# Patient Record
Sex: Male | Born: 1935 | Race: White | Hispanic: No | Marital: Married | Smoking: Former smoker
Health system: Southern US, Community
[De-identification: ages and names within clinical notes are randomized; demographics above are authoritative.]

## PROBLEM LIST (undated history)

## (undated) DIAGNOSIS — I639 Cerebral infarction, unspecified: Secondary | ICD-10-CM

## (undated) DIAGNOSIS — I4891 Unspecified atrial fibrillation: Secondary | ICD-10-CM

## (undated) DIAGNOSIS — H9190 Unspecified hearing loss, unspecified ear: Secondary | ICD-10-CM

## (undated) DIAGNOSIS — K5730 Diverticulosis of large intestine without perforation or abscess without bleeding: Secondary | ICD-10-CM

## (undated) DIAGNOSIS — M2550 Pain in unspecified joint: Secondary | ICD-10-CM

## (undated) DIAGNOSIS — R413 Other amnesia: Secondary | ICD-10-CM

## (undated) DIAGNOSIS — F32A Depression, unspecified: Secondary | ICD-10-CM

## (undated) DIAGNOSIS — E785 Hyperlipidemia, unspecified: Secondary | ICD-10-CM

## (undated) DIAGNOSIS — G10 Huntington's disease: Secondary | ICD-10-CM

## (undated) DIAGNOSIS — R269 Unspecified abnormalities of gait and mobility: Secondary | ICD-10-CM

## (undated) DIAGNOSIS — K219 Gastro-esophageal reflux disease without esophagitis: Secondary | ICD-10-CM

## (undated) DIAGNOSIS — K449 Diaphragmatic hernia without obstruction or gangrene: Secondary | ICD-10-CM

## (undated) DIAGNOSIS — K222 Esophageal obstruction: Secondary | ICD-10-CM

## (undated) DIAGNOSIS — K5130 Ulcerative (chronic) rectosigmoiditis without complications: Secondary | ICD-10-CM

## (undated) DIAGNOSIS — F329 Major depressive disorder, single episode, unspecified: Secondary | ICD-10-CM

## (undated) DIAGNOSIS — M1990 Unspecified osteoarthritis, unspecified site: Secondary | ICD-10-CM

## (undated) DIAGNOSIS — E039 Hypothyroidism, unspecified: Secondary | ICD-10-CM

## (undated) DIAGNOSIS — K5190 Ulcerative colitis, unspecified, without complications: Secondary | ICD-10-CM

## (undated) DIAGNOSIS — I499 Cardiac arrhythmia, unspecified: Secondary | ICD-10-CM

## (undated) DIAGNOSIS — K649 Unspecified hemorrhoids: Secondary | ICD-10-CM

## (undated) DIAGNOSIS — N400 Enlarged prostate without lower urinary tract symptoms: Secondary | ICD-10-CM

## (undated) HISTORY — DX: Unspecified atrial fibrillation: I48.91

## (undated) HISTORY — DX: Ulcerative (chronic) rectosigmoiditis without complications: K51.30

## (undated) HISTORY — DX: Depression, unspecified: F32.A

## (undated) HISTORY — DX: Gastro-esophageal reflux disease without esophagitis: K21.9

## (undated) HISTORY — PX: BRAIN SURGERY: SHX531

## (undated) HISTORY — DX: Unspecified hearing loss, unspecified ear: H91.90

## (undated) HISTORY — PX: SHOULDER ARTHROSCOPY W/ ROTATOR CUFF REPAIR: SHX2400

## (undated) HISTORY — DX: Other amnesia: R41.3

## (undated) HISTORY — DX: Major depressive disorder, single episode, unspecified: F32.9

## (undated) HISTORY — DX: Hyperlipidemia, unspecified: E78.5

## (undated) HISTORY — DX: Unspecified hemorrhoids: K64.9

## (undated) HISTORY — DX: Diverticulosis of large intestine without perforation or abscess without bleeding: K57.30

## (undated) HISTORY — DX: Ulcerative colitis, unspecified, without complications: K51.90

## (undated) HISTORY — DX: Unspecified osteoarthritis, unspecified site: M19.90

## (undated) HISTORY — DX: Pain in unspecified joint: M25.50

## (undated) HISTORY — PX: LUMBAR FUSION: SHX111

## (undated) HISTORY — DX: Esophageal obstruction: K22.2

## (undated) HISTORY — DX: Cerebral infarction, unspecified: I63.9

## (undated) HISTORY — DX: Benign prostatic hyperplasia without lower urinary tract symptoms: N40.0

## (undated) HISTORY — PX: TONSILLECTOMY: SUR1361

## (undated) HISTORY — DX: Unspecified abnormalities of gait and mobility: R26.9

## (undated) HISTORY — PX: APPENDECTOMY: SHX54

## (undated) HISTORY — PX: OTHER SURGICAL HISTORY: SHX169

## (undated) HISTORY — DX: Diaphragmatic hernia without obstruction or gangrene: K44.9

---

## 1999-09-18 ENCOUNTER — Other Ambulatory Visit: Admission: RE | Admit: 1999-09-18 | Discharge: 1999-09-18 | Payer: Self-pay | Admitting: Urology

## 2001-01-11 ENCOUNTER — Encounter: Admission: RE | Admit: 2001-01-11 | Discharge: 2001-02-18 | Payer: Self-pay | Admitting: Orthopedic Surgery

## 2001-07-11 ENCOUNTER — Ambulatory Visit (HOSPITAL_COMMUNITY): Admission: RE | Admit: 2001-07-11 | Discharge: 2001-07-11 | Payer: Self-pay | Admitting: Internal Medicine

## 2001-11-17 ENCOUNTER — Encounter: Admission: RE | Admit: 2001-11-17 | Discharge: 2001-12-30 | Payer: Self-pay | Admitting: Orthopedic Surgery

## 2002-01-11 ENCOUNTER — Encounter: Admission: RE | Admit: 2002-01-11 | Discharge: 2002-01-19 | Payer: Self-pay | Admitting: Orthopedic Surgery

## 2003-05-29 ENCOUNTER — Ambulatory Visit (HOSPITAL_COMMUNITY): Admission: RE | Admit: 2003-05-29 | Discharge: 2003-05-29 | Payer: Self-pay | Admitting: Internal Medicine

## 2005-08-04 ENCOUNTER — Ambulatory Visit: Payer: Self-pay | Admitting: Internal Medicine

## 2005-08-18 ENCOUNTER — Ambulatory Visit: Payer: Self-pay | Admitting: Internal Medicine

## 2005-09-02 ENCOUNTER — Encounter: Payer: Self-pay | Admitting: Internal Medicine

## 2005-09-02 ENCOUNTER — Ambulatory Visit (HOSPITAL_COMMUNITY): Admission: RE | Admit: 2005-09-02 | Discharge: 2005-09-02 | Payer: Self-pay | Admitting: Internal Medicine

## 2005-09-02 ENCOUNTER — Ambulatory Visit: Payer: Self-pay | Admitting: Internal Medicine

## 2006-02-04 ENCOUNTER — Ambulatory Visit: Payer: Self-pay | Admitting: Cardiology

## 2006-02-25 ENCOUNTER — Ambulatory Visit: Payer: Self-pay | Admitting: Cardiology

## 2006-06-28 ENCOUNTER — Ambulatory Visit: Payer: Self-pay | Admitting: Internal Medicine

## 2006-06-29 ENCOUNTER — Ambulatory Visit: Payer: Self-pay | Admitting: Cardiology

## 2006-07-02 ENCOUNTER — Ambulatory Visit: Payer: Self-pay | Admitting: Cardiology

## 2006-07-02 ENCOUNTER — Ambulatory Visit (HOSPITAL_COMMUNITY): Admission: RE | Admit: 2006-07-02 | Discharge: 2006-07-02 | Payer: Self-pay | Admitting: Cardiology

## 2006-08-03 ENCOUNTER — Ambulatory Visit: Payer: Self-pay | Admitting: Cardiology

## 2006-08-18 ENCOUNTER — Ambulatory Visit: Payer: Self-pay | Admitting: Internal Medicine

## 2007-08-22 ENCOUNTER — Ambulatory Visit: Payer: Self-pay | Admitting: Gastroenterology

## 2007-11-01 ENCOUNTER — Ambulatory Visit (HOSPITAL_COMMUNITY): Admission: RE | Admit: 2007-11-01 | Discharge: 2007-11-01 | Payer: Self-pay | Admitting: Internal Medicine

## 2007-11-01 ENCOUNTER — Encounter: Payer: Self-pay | Admitting: Internal Medicine

## 2007-11-01 ENCOUNTER — Ambulatory Visit: Payer: Self-pay | Admitting: Internal Medicine

## 2008-04-17 HISTORY — PX: ESOPHAGOGASTRODUODENOSCOPY: SHX1529

## 2008-05-03 ENCOUNTER — Ambulatory Visit: Payer: Self-pay | Admitting: Internal Medicine

## 2008-05-04 ENCOUNTER — Ambulatory Visit (HOSPITAL_COMMUNITY): Admission: RE | Admit: 2008-05-04 | Discharge: 2008-05-04 | Payer: Self-pay | Admitting: Internal Medicine

## 2008-05-04 ENCOUNTER — Ambulatory Visit: Payer: Self-pay | Admitting: Internal Medicine

## 2008-08-17 DIAGNOSIS — G10 Huntington's disease: Secondary | ICD-10-CM

## 2008-08-17 HISTORY — DX: Huntington's disease: G10

## 2009-01-09 ENCOUNTER — Telehealth (INDEPENDENT_AMBULATORY_CARE_PROVIDER_SITE_OTHER): Payer: Self-pay

## 2009-01-21 ENCOUNTER — Encounter: Payer: Self-pay | Admitting: Gastroenterology

## 2009-03-08 DIAGNOSIS — R1319 Other dysphagia: Secondary | ICD-10-CM | POA: Insufficient documentation

## 2009-03-08 DIAGNOSIS — K5100 Ulcerative (chronic) pancolitis without complications: Secondary | ICD-10-CM | POA: Insufficient documentation

## 2009-03-08 DIAGNOSIS — K5730 Diverticulosis of large intestine without perforation or abscess without bleeding: Secondary | ICD-10-CM | POA: Insufficient documentation

## 2009-03-08 DIAGNOSIS — R197 Diarrhea, unspecified: Secondary | ICD-10-CM | POA: Insufficient documentation

## 2009-03-14 ENCOUNTER — Ambulatory Visit: Payer: Self-pay | Admitting: Internal Medicine

## 2009-03-27 ENCOUNTER — Encounter: Payer: Self-pay | Admitting: Internal Medicine

## 2009-11-07 ENCOUNTER — Encounter (INDEPENDENT_AMBULATORY_CARE_PROVIDER_SITE_OTHER): Payer: Self-pay

## 2009-12-18 ENCOUNTER — Encounter: Payer: Self-pay | Admitting: Gastroenterology

## 2009-12-23 ENCOUNTER — Encounter: Payer: Self-pay | Admitting: Gastroenterology

## 2010-01-01 ENCOUNTER — Encounter: Payer: Self-pay | Admitting: Gastroenterology

## 2010-01-21 ENCOUNTER — Ambulatory Visit (HOSPITAL_COMMUNITY): Admission: RE | Admit: 2010-01-21 | Discharge: 2010-01-21 | Payer: Self-pay | Admitting: Family Medicine

## 2010-02-05 ENCOUNTER — Encounter: Payer: Self-pay | Admitting: Internal Medicine

## 2010-02-14 HISTORY — PX: COLONOSCOPY: SHX174

## 2010-02-19 ENCOUNTER — Encounter: Payer: Self-pay | Admitting: Internal Medicine

## 2010-02-24 ENCOUNTER — Ambulatory Visit: Payer: Self-pay | Admitting: Internal Medicine

## 2010-02-24 ENCOUNTER — Ambulatory Visit (HOSPITAL_COMMUNITY): Admission: RE | Admit: 2010-02-24 | Discharge: 2010-02-24 | Payer: Self-pay | Admitting: Internal Medicine

## 2010-02-27 ENCOUNTER — Encounter: Payer: Self-pay | Admitting: Internal Medicine

## 2010-04-02 ENCOUNTER — Encounter: Payer: Self-pay | Admitting: Internal Medicine

## 2010-05-21 ENCOUNTER — Encounter: Payer: Self-pay | Admitting: Urgent Care

## 2010-09-16 NOTE — Letter (Signed)
Summary: LABS  LABS   Imported By: Hoy Morn 04/02/2010 09:50:03  _____________________________________________________________________  External Attachment:    Type:   Image     Comment:   External Document

## 2010-09-16 NOTE — Medication Information (Signed)
Summary: change to generic omeprazole  change to generic omeprazole   Imported By: Burnadette Peter LPN 88/71/9597 47:18:55  _____________________________________________________________________  External Attachment:    Type:   Image     Comment:   External Document

## 2010-09-16 NOTE — Medication Information (Signed)
Summary: Visual merchandiser   Imported By: Orma Flaming 02/19/2010 09:10:47  _____________________________________________________________________  External Attachment:    Type:   Image     Comment:   External Document

## 2010-09-16 NOTE — Medication Information (Signed)
Summary: pentasa rx  pentasa rx   Imported By: Burnadette Peter LPN 62/94/7654 65:03:54  _____________________________________________________________________  External Attachment:    Type:   Image     Comment:   External Document

## 2010-09-16 NOTE — Letter (Signed)
Summary: Recall, Screening Colonoscopy Only  [facility]  [address]   [place], [place] [address]   Phone: [phone]  Fax: [phone]    November 07, 2009  GILLIAM HAWKES [address] [place], [place]  [address] 1935-03-28   Dear Mr. Zehren,   Our records indicate it is time to schedule your colonoscopy.    Please call our office at [phone] and ask for the nurse.   Thank you,  Burnadette Peter, LPN Waldon Merl, [facility] Ph: [phone]   Fax: [phone]

## 2010-09-16 NOTE — Letter (Signed)
Summary: Patient Notice, Colon Biopsy Results  [facility]  [address]   [place], [place] [address]   Phone: [phone]  Fax: [phone]       February 27, 2010   KRISTAIN HU [address] [place], [place]  [address] [birth year]    Dear Mr. Duman,  I am pleased to inform you that the biopsies taken during your recent colonoscopy did not show any evidence of cancer upon pathologic examination.  Additional information/recommendations:  You should have a repeat colonoscopy examination  in 2 years.  Please call us if you are having persistent problems or have questions about your condition that have not been fully answered at this time.  Sincerely,    R. Garfield Cornea MD, [facility] Ph: [phone]    Fax: [phone]   Appended Document: Patient Notice, Colon Biopsy Results reminder appt made for 84yrTCS, Op & Path faxed to PCP- cdg    Appended Document: Patient Notice, Colon Biopsy Results Letter mailed to pt.

## 2010-09-16 NOTE — Letter (Signed)
Summary: Internal Other Lynne Logan  Internal Other Lynne Logan   Imported By: Waldon Merl LPN 95/97/4718 55:01:58  _____________________________________________________________________  External Attachment:    Type:   Image     Comment:   External Document

## 2010-09-16 NOTE — Medication Information (Signed)
Summary: RX Folder  RX Folder   Imported By: Sherry Ruffing 12/23/2009 13:34:14  _____________________________________________________________________  External Attachment:    Type:   Image     Comment:   External Document  Appended Document: RX Folder duplicate

## 2010-09-16 NOTE — Medication Information (Signed)
Summary: RX Folder  RX Folder   Imported By: Sherry Ruffing 12/18/2009 13:53:17  _____________________________________________________________________  External Attachment:    Type:   Image     Comment:   External Document  Appended Document: RX Folder need paper version  Appended Document: RX Folder On your cart.  Appended Document: RX Folder Need paper version of form in EMR, not paper chart.  Appended Document: RX Folder Please see if pt has tried alternatives?  Appended Document: RX Folder done

## 2010-12-30 NOTE — Op Note (Signed)
NAMEADAM, Sergio Baker               ACCOUNT NO.:  [id number]   MEDICAL RECORD NO.:  [id number]          PATIENT TYPE:  AMB   LOCATION:  DAY                           FACILITY:  [facility]   PHYSICIAN:  R. Garfield Cornea, M.D. DATE OF BIRTH:  01-17-1935   DATE OF PROCEDURE:  11/01/2007  DATE OF DISCHARGE:                               OPERATIVE REPORT   PROCEDURE:  Ileocolonoscopy, segmental biopsies.   INDICATIONS FOR PROCEDURE:  A 76 year old gentleman with longstanding  ulcerative colitis in remission on Pentasa.  Last colonoscopy was two  years ago.  He had evidence of microscopic colitis endoscopically.  The  colon looked relatively good.  He has been maintained in clinical  remission on Pentasa for some time now and currently continues not to  have any symptoms.  Colonoscopy is now being done with surveillance  biopsies.  Approach has discussed with the patient at length.  Potential  risks, benefits, limitations have been reviewed, his questions answered,  he is agreeable.  Please see documentation in the medical record.   PROCEDURE NOTE:  O2 saturation, blood pressure, pulse, respirations were  monitored throughout entire procedure.  Conscious sedation Versed 5 mg  IV Demerol 75 mg IV in divided doses.  Instrument Pentax video chip  system.   FINDINGS:  Digital rectal exam revealed no abnormalities.   ENDOSCOPIC FINDINGS:  The prep was adequate.  Colon:  Colonic mucosa was surveyed from rectosigmoid junction through  the left, transverse, right colon to area of appendiceal orifice,  ileocecal valve and cecum.  These structures well seen and photographed  for record.  The terminal ileum was intubated to 10 cm.  From this  level, scope was withdrawn.  All previously mentioned mucosal surfaces  were again seen.  The patient had left-sided diverticula.  However  endoscopically the colonic mucosa appeared normal.  Terminal ileum  mucosa appeared normal aside from a single diverticulum  noted.  Segmental biopsies of the ascending, transverse, descending and sigmoid  segments were taken to rule out dysplasia.  The scope was pulled down  into the rectum where thorough examination of rectal mucosa including  retroflex view of the anal verge demonstrated mucosal friability and  granularity.  There are no erosions or ulcers.  Biopsies of the rectal  mucosa were also taken.  The patient tolerated the procedure well was  reacted in endoscopy.   IMPRESSION:  1. Granularity and friability of the rectal mucosa status post biopsy.  2. Left-sided diverticula.  Endoscopically normal-appearing colonic      mucosa.  Terminal ileum with a single diverticulum.  Status post      segmental biopsy.   RECOMMENDATIONS:  1. Follow-up on path.  2. CBC and BMET today.  3. Further recommendations to follow.      Bridgette Habermann, M.D.  Electronically Signed     RMR/MEDQ  D:  11/01/2007  T:  11/01/2007  Job:  [id number]   cc:   [place]

## 2010-12-30 NOTE — Assessment & Plan Note (Signed)
NAMEJANI, PLOEGER                CHART#:  [id number]   DATE:  08/22/2007                       DOB:  [birth year]   CHIEF COMPLAINT:  Follow up ulcerative colitis/gastroesophageal reflux  disease.   SUBJECTIVE:  Mr. Jacobsen is a 76 year old Caucasian male with a history  of left-sided UC and diverticulosis as well as chronic GERD. He is  maintained on Pentasa 1 gram q.i.d. He has done quite well. He has  noticed a small amount of intermittent hematochezia with wiping and has  noticed some irritation and erythema to his anus. He thinks he is having  problems with hemorrhoids. 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 He is taking Aciphex 20 mg daily on a p.r.n. basis  for his GERD mainly because he tells me it is difficult to afford and  his weight is down 9 pounds in the last year.   CURRENT MEDICATIONS:  See the list from August 22, 2007.   ALLERGIES:  No known drug allergies.   OBJECTIVE:  VITAL SIGNS: Weight 165 pounds, height 69 inches,  temperature 97.5, blood pressure 138/78 and pulse 80.\  GENERAL: He is an elderly Caucasian male who is alert, oriented and  cooperative and in no acute distress.  HEENT: Sclerae are  clear and anicteric. Conjunctivae pink. Oropharynx  pink and moist without any lesions.  NECK: Supple without lymphadenopathy or thyromegaly.  CHEST: HEART: Regular rate and rhythm. Normal S1, S2.  ABDOMEN: Positive bowel sounds in all four quadrants. No bruits  auscultated. Soft and nontender, nondistended without palpable mass or  hepatosplenomegaly. No rebound, tenderness or guarding.  RECTAL: He has a 2 cm circumference of significant erythema without  exudate to the perianal area. Internal examination was benign. No masses  or tenderness.   ASSESSMENT:  1. Mr. Carrero is a 76 year old Caucasian male with left-sided      ulcerative colitis. He appears to have some distal symptoms of      proctitis at this time. He is also due  for surveillance colonoscopy      this year, but is wanting to put this off due to financial      concerns. I have urged him to have this done.  2. Chronic gastroesophageal reflux disease, well-controlled on proton      pump inhibitor.   PLAN:  1. Rowasa 4 gram enemas at bedtime #28 without refill.  2. Aciphex 20 mg daily #90 with three refills.  3. Pentasa 500 mg two p.o. q.i.d. #720 with three refills.  4. CBC and CMP.  5. He is going to call us  to schedule surveillance colonoscopy ASAP.      Otherwise, will plan on seeing him back in one year or sooner if he      has any problems.       Vickey Huger, N.P.  Electronically Signed     Caro Hight, M.D.  Electronically Signed    KJ/MEDQ  D:  08/22/2007  T:  08/22/2007  Job:  [id number]   cc:   Bonne Dolores, M.D.

## 2010-12-30 NOTE — Assessment & Plan Note (Signed)
NAMEDOLPH, TAVANO                CHART#:  [id number]   DATE:  08/22/2007                       DOB:  08-03-1935   CHIEF COMPLAINT:  Follow-up ulcerative colitis/GERD.   SUBJECTIVE:  Mr. Bridge is a 76 year old Caucasian male with a history  of left sided UC and diverticulosis as well as chronic GERD.  He is  maintained on potassium 1 g q.i.d. He has done quite well.  He has  noticed a small amount of intermittent hematochezia with wiping and has  noticed some irritation and erythema to his anus.  He thinks he is  having problems with hemorrhoids.  He has also been treated with  antibiotics for MRSA to his buttocks.  He is taking Aciphex 20 mg daily  on a p.r.n. basis for his GERD mainly because he tells me it is  difficult to afford.  His weight is down 9 pounds in the last year.   CURRENT MEDICATIONS:  See the list from 08/22/07.   ALLERGIES:  No known drug allergies.   OBJECTIVE:  VITAL SIGNS:  Weight 165 pounds, height 69 inches, temp  97.5, blood pressure 130/78 and pulse 80.  GENERAL:  He is an elderly Caucasian male who is alert, pleasant,  cooperative, no acute distress.  HEENT:  Pupils equal, clear, noninjected.  Conjunctivae pink. Oropharynx  pink and moist without any lesions.  CHEST:  Heart regular rate and rhythm, normal S1/S2.  ABDOMEN:  Positive bowel sounds x 4.  No bruits palpated, soft,  nontender, nondistended without palpable masses, no hepatosplenomegaly.  RECTAL EXAM:  Regarding rectal, he has a 2 cm circumference of  significant erythema without exudate to the perianal area.  Internal  exam was benign.  No masses or tenderness.   ASSESSMENT:  Mr. Stephens is a 48 year old Caucasian male with left sided  ulcerative colitis.  He appears to have some distal parosteitis at this  time.  He is also due for surveillance colonoscopy this year but is  wanting to put this off due to financial concerns.  I have urged him to  have this done.   He has chronic GERD  well controlled on PBI.   PLAN:  1. Rowasa 4 mg enemas at bed time #28 with no refill.  2. Aciphex 20 mg daily, #90 with 3 refills.  3. Pentasa 500 mg 2 p.o. q.i.d. #720 with 3 refills.  4. CBC and CMP.  5. He is going to call us to schedule surveillance colonoscopy.      Otherwise will plan on seeing him back in one year or sooner if he      has any problems.   ADDENDUM:  Cherylann Banas will call with results. OPV with KJ/RMR if needed.       Vickey Huger, N.P.  Electronically Signed     Caro Hight, M.D.  Electronically Signed    KJ/MEDQ  D:  08/22/2007  T:  08/23/2007  Job:  [id number]   cc:   Bonne Dolores, M.D.  Bridgette Habermann, M.D.

## 2010-12-30 NOTE — Op Note (Signed)
NAMERAWN, QUIROA               ACCOUNT NO.:  [id number]   MEDICAL RECORD NO.:  [id number]          PATIENT TYPE:  AMB   LOCATION:  DAY                           FACILITY:  [facility]   PHYSICIAN:  R. Garfield Cornea, M.D. DATE OF BIRTH:  [birth year]   DATE OF PROCEDURE:  05/04/2008  DATE OF DISCHARGE:                               OPERATIVE REPORT   PROCEDURE:  Esophagogastroduodenoscopy Maloney dilation.   INDICATIONS FOR PROCEDURE:  A 76 year old gentleman with history of  ulcerative colitis who came to see me recently with worsening esophageal  dysphagia and a background chronic gastroesophageal reflux disease.  He  had a Schatzki ring dilated some 27 years ago at [facility].  EGD  is now being done.  Potential for esophageal dilation were reviewed.  Risks, benefits, alternatives, and limitations have been discussed.  Questions answered and all parties agreeable.   PROCEDURE NOTE:  O2 saturation, blood pressure, pulse, and respirations  were monitored throughout the entire procedure.   CONSCIOUS SEDATION:  Versed 4 mg IV and Demerol 75 mg IV in divided  doses.  Cetacaine spray for topical pharyngeal anesthesia.   INSTRUMENT:  Pentax video chip system.   FINDINGS:  Examination of tubular esophagus revealed a Schatzki ring at  the GE junction; otherwise, esophageal mucosa appeared unremarkably  normal.  EG junction easily traversed. Stomach:  Gastric cavity was  emptied and insufflated well with air.  Thorough examination of the  gastric mucosa including retroflexed view of the proximal stomach  esophagogastric junction demonstrated only a small hiatal hernia.  Pylorus was patent and easily traversed.  Examination of the bulb and  second portion revealed no abnormalities.  Therapeutic/diagnostic  maneuvers were performed.  Scope was withdrawn.  A 56-French Maloney  dilator was passed with full insertion.  A look back revealed a nice  disruption of the ring without apparent  complication with minimal  bleeding.  The patient tolerated the procedure well and was reacted to  Endoscopy.   IMPRESSION:  1. Schatzki ring; otherwise, unremarkable esophagus status post      dilation as described above.  Otherwise, unremarkable esophagus.  2. Hiatal hernia; otherwise, normal stomach, D1 and D2.   RECOMMENDATIONS:  Mr. Talent should take Aciphex every day and not on a  as needed basis, and the importance of suppressing gastroesophageal  reflux has been reviewed.  We will plan to see Mr. Whetzel back in 1  year unless something comes up in the interim.      Bridgette Habermann, M.D.  Electronically Signed     RMR/MEDQ  D:  05/04/2008  T:  05/05/2008  Job:  [id number]   cc:   Chipper Herb, M.D.  Fax: [phone]

## 2010-12-30 NOTE — H&P (Signed)
NAMEROHNAN, BARTLESON               ACCOUNT NO.:  [id number]   MEDICAL RECORD NO.:  [id number]          PATIENT TYPE:  AMB   LOCATION:  DAY                           FACILITY:  [facility]   PHYSICIAN:  R. Garfield Cornea, M.D. DATE OF BIRTH:  Nov 30, 1934   DATE OF ADMISSION:  DATE OF DISCHARGE:  LH                              HISTORY & PHYSICAL   CHIEF COMPLAINT:  Dysphagia, gastroesophageal reflux disease.   HISTORY OF PRESENT ILLNESS:  Sergio Baker is a pleasant 76 year old  gentleman with a long history of left-sided proctocolitis  gastroesophageal reflux disease who over the past couple of months had  noted worsening symptoms of difficulty swallowing.  He has trouble  getting meats, breads down and sometimes it feels like he gets filled up  after eating just a few mouthfuls.  He has not had any frank abdominal  pain.  No nausea or vomiting, some regurgitation, has some difficulties  with pills.  He takes Aciphex only as needed 3-4 times weekly.  I have  reviewed chart going back to 1982.  He apparently did have an EGD by Dr.  Noemi Chapel at [facility] in 1982 which revealed only a small hiatal hernia.   SOCIAL HISTORY:  He does not use tobacco or alcohol.   PAST MEDICAL HISTORY:  Significant for appendectomy, tonsillectomy, cyst  removed from urinary bladder.  No history diabetes, hypertension, heart  disease, or pulmonary disease.   CURRENT MEDICATIONS:  1. Pentasa 500 mg tablet 4 tablets daily.  2. Flomax.  3. Vitamin supplements.  4. Glucosamine.  5. Asa 81 mg daily.  6. Finasteride 5 mg daily.  7. Bee Pollen.  8. Aciphex 20 mg as needed.  9. Nystatin.  10.Tylenol Arthritis.   ALLERGIES:  No known drug allergies.   FAMILY HISTORY:  Negative for chronic GI or liver illness.   SOCIAL HISTORY:  He is retired Furniture conservator/restorer.  He works as a Pharmacist, hospital  during the holidays.  He has one child.   REVIEW OF SYSTEMS:  No chest pain, dyspnea on exertion.  No change in  bowel habits.  No  change in weight.   PHYSICAL EXAMINATION:  GENERAL:  A pleasant 76 year old gentleman with a  long white beard, weight 170, height 5 feet 9, temperature 98.2, BP  120/78, and pulse 88.  SKIN:  Warm and dry.  There is no jaundice noted.  No stigmata, chronic  liver disease.  CHEST:  Lungs are clear to auscultation.  CARDIAC:  Regular rate and rhythm without murmur, gallop, or rub.  ABDOMEN:  Nondistended.  Positive bowel sounds, soft, nontender.  No  appreciable mass or  hepatosplenomegaly.   IMPRESSION:  Sergio Baker is a 76 year old gentleman with  longstanding gastroesophageal reflux disease on intermittent proton pump  inhibitor and now has vague symptoms esophageal dysphagia and early  satiety.  He has a history of a Schatzki's ring going back on EGD some  27 years ago.  I told Sergio Baker the best approach will be for him to  go and have an EGD with plans to dilate the esophagus if  appropriate.  Risks, benefits, and alternative have been reviewed, questions are  answered.  He is agreeable with plan to perform diagnostic EGD and  possible esophageal dilation in the very near future.  Further  recommendations to follow.      Bridgette Habermann, M.D.  Electronically Signed     RMR/MEDQ  D:  05/03/2008  T:  05/04/2008  Job:  [id number]   cc:   Chipper Herb, M.D.  Fax: [phone]

## 2011-01-02 NOTE — Op Note (Signed)
NAME:  Sergio Baker, Sergio Baker                         ACCOUNT NO.:  [id number]   MEDICAL RECORD NO.:  [id number]                   PATIENT TYPE:  AMB   LOCATION:  DAY                                  FACILITY:  [facility]   PHYSICIAN:  R. Garfield Cornea, M.D.              DATE OF BIRTH:  1935/07/22   DATE OF PROCEDURE:  05/29/2003  DATE OF DISCHARGE:                                 OPERATIVE REPORT   PROCEDURE:  Surveillance colonoscopy with biopsy.   INDICATIONS FOR PROCEDURE:  The patient is a 76 year old gentleman with a  nearly 20-year history of predominantly left-sided UC.  He is currently  asymptomatic on a regimen including Pentasa.   Last colonoscopy was in 2000.  He was found to have limited left-sided  disease with no evidence of dysplasia.  Colonoscopy is now being done as a  surveillance maneuver.  This approach has been discussed with the patient at  length.  The potential risks, benefits, and alternatives have been reviewed.  Please see the documentation in the medical record.   PROCEDURE:  O2 saturation, blood pressure, pulses, and respirations were  monitored throughout the entire procedure.  Conscious sedation was with  Versed 4 mg IV, Demerol 75 mg IV in divided doses.  The instrument used was  the Olympus video chip adult colonoscope.   FINDINGS:  Digital rectal examination revealed no abnormalities.   ENDOSCOPIC FINDINGS:  The prep was good.   Rectum:  Examination of the rectal mucosa including retroflex view of the  anal verge revealed no mucosal abnormalities.   Colon:  The colonic mucosa was surveyed from the rectosigmoid junction  through the left, transverse, right colon to the area of the appendiceal  orifice, ileocecal valve, and cecum.  These structures were well-seen and  photographed for the record.  The patient had some patchy granularity of the  sigmoid colon; otherwise, the colonic mucosa really looked entirely normal.  The terminal ileum was intubated to  5 cm.  This segment of the GI tract also  appeared normal.  From the level of the cecum and ileocecal valve, the scope  was slowly and cautiously withdrawn.  All previously mentioned mucosal  surfaces were again seen.  Segmental biopsies of the right transverse and  descending colon were taken for histologic study.  Also, several biopsies of  the sigmoid and rectal mucosa were also taken.  The patient tolerated the  procedure very well and was reactive in endoscopy.   IMPRESSION:  1. Normal rectum.  2. Patchy granularity of the sigmoid; otherwise, normal-appearing colonic     mucosa.  Normal terminal ileum.  Segmental biopsies taken.    RECOMMENDATIONS:  1. Follow up on pathology.  2. Further recommendations to follow.      ___________________________________________  Bridgette Habermann, M.D.   RMR/MEDQ  D:  05/29/2003  T:  05/29/2003  Job:  [id number]   cc:   Chipper Herb, M.D.  [place]  [place] [address]  Fax: [phone]

## 2011-01-02 NOTE — Op Note (Signed)
NAMEORAZIO, Sergio Baker               ACCOUNT NO.:  [id number]   MEDICAL RECORD NO.:  [id number]          PATIENT TYPE:  AMB   LOCATION:  DAY                           FACILITY:  [facility]   PHYSICIAN:  R. Garfield Cornea, M.D. DATE OF BIRTH:  1935-08-10   DATE OF PROCEDURE:  09/02/2005  DATE OF DISCHARGE:                                 OPERATIVE REPORT   PROCEDURE:  Colonoscopy, segmental biopsy, stool collection.   INDICATIONS FOR PROCEDURE:  Patient is a 76 year old gentleman with  longstanding proctocolitis.  He has had a flare of symptoms recently.  He  was seen in the office on August 18, 2005.  He is due for surveillance  colonoscopy anyway.  He has taken additional cortisone enemas and tells me  symptoms have markedly improved over the past one week.  No recent  antibiotics.  No sick exposures.  No NSAIDs.  Colonoscopy is now being done.  This approached has been discussed with the patient at length.  Potential  risks, benefits and alternatives have been reviewed and questions answered.  He is agreeable.  Please see documentation of the medical record.   PROCEDURE NOTE:  O2 saturation, blood pressure, pulse, and respirations are  monitored throughout the entire procedure.  Conscious sedation with Versed 2  mg IV and Demerol 50 mg IV.   INSTRUMENT:  Olympus videochip system.   FINDINGS:  Digital rectal exam revealed no abnormalities.   ENDOSCOPIC FINDINGS:  Prep was adequate.   RECTUM:  Examination of the rectal mucosa, including retroflexed view of the  anal verge, revealed very minimal granularity of the mucosa, normal vascular  pattern, well maintained.  Certainly, no erosions or ulcers were seen.   COLON:  Colonic mucosa was surveyed from the rectosigmoid junction through  the left transverse right colon, the area of the appendiceal orifice,  ileocecal valve, and cecum.  These structures were well seen and  photographed for the record.  From this point, the scope was  slowly  withdrawn.  The previously mentioned mucosal surfaces were again seen.  The  patient had scattered left-sided diverticula.  There was very minimal patchy  granularity of the sigmoid colon, otherwise the colonic mucosa looked  endoscopically normal.  Segmental biopsies of the right colon, descending,  sigmoid, and rectal mucosa were submitted to the pathologist.   A stool sample was submitted to the lab as well.   The patient tolerated the procedure well and was __________.   ENDOSCOPIC IMPRESSION:  1.  Internal hemorrhoids.  2.  Minimal granularity of the rectum and sigmoid.  3.  Left-sided diverticula.  4.  The remainder of the rectal colonic mucosa appeared normal.  Segmental      biopsies taken.  Stool sample obtained.   RECOMMENDATIONS:  1.  Will follow up on stool studies (culture and C. diff).  We will check      biopsies to assess for histologic colitis and superimposed CMV      infection.  2.  Continue AcipHex.  Continue Pentasa 1 gm q.i.d.  No NSAIDs.  3.  Further recommendations to follow.  Bridgette Habermann, M.D.  Electronically Signed     RMR/MEDQ  D:  09/02/2005  T:  09/02/2005  Job:  [id number]   cc:   Bonne Dolores, M.D.  Fax: [phone]

## 2011-01-02 NOTE — Letter (Signed)
August 03, 2006    Chipper Herb, M.D.  [address] [place], [place] [address]   RE:  Sergio Baker, Sergio Baker  MRN:  [id number]  /  DOB:  [birth year]   Dear Sergio Baker:   Sergio Baker returns to the office following one month of event  recording.  He identified two symptomatic episodes.  In one case, a  single PVC was present.  I do not have the tracing for the other event.  More impressive were multiple events automatically detected by the  device.  He had multiple episodes of atrial fibrillation set off by  PACs.  Each episode appears to be brief, lasting only a number of  seconds or minutes.  There is no indication that Sergio Baker was  symptomatic during any of these spells.  The heart rate was typically  around 130, but was 200 on one occasion.   Medications are unchanged.   On exam, a pleasant gentleman in no acute distress.  The weight is 159,  11 pounds less than one month ago.  Blood pressure 135/70, heart rate 75  and regular, respirations 16.   IMPRESSION:  Sergio Baker does have paroxysmal atrial arrhythmias.  Based  upon the two channels provided by the event recorder, this appears to be  paroxysmal atrial fibrillation.  The patient's only risk factor is his  age and excessive size.  With all episodes that we have identified being  brief and asymptomatic, I would not be inclined to treat him with full  anticoagulation.  He will continue taking aspirin.   It is hard to believe that he is not symptomatic when his heart rate is  200.  The brevity of these episodes may be the principle factor that  prevents him from appreciating that they are occurring.  It is likely  that the length of episodes will increase as he ages.  For now, I do not  believe that  therapy is warranted.  Since his current symptoms are nonspecific, if we  treat with him as drug such as a beta-blocker, it will be difficult to  tell whether this is exacerbating his weakness and malaise.  He will  call me  should he develop any more significant symptoms.  For now, his  symptoms have actually become very infrequent.  I will see him again in  six months.    Sincerely,      Cristopher Estimable. Lattie Haw, MD, [facility]  Electronically Signed    RMR/MedQ  DD: 08/03/2006  DT: 08/03/2006  Job #: [id number]

## 2011-01-02 NOTE — Assessment & Plan Note (Signed)
[facility]                              CARDIOLOGY OFFICE NOTE   Baker, Sergio                      MRN:          [id number]  DATE:02/25/2006                            DOB:          [birth year]    REFERRING PHYSICIAN:  Dr. Morrie Sheldon from [facility].   [name] returns to the office for additional assessment.  His laboratory  studies including a CBC, chemistry profile, lipid profile and TSH level were  entirely normal.  We performed a graded exercise test, a separate report of  which will be forwarded to your office.  [name] did very well with no  electrocardiographic evidence for myocardial ischemia and no symptoms.  He  did transiently develop an SVT to a rate of 190 that was also  asymptomatic.   At this point I see no evidence for significant cardiac disease.  Should his  arrhythmia becomes problematic, he could be treated with beta blocker.  At  the present time he feels well and does not require any additional medical  therapy.  Please let me know at any time that I can offer further assistance  in the care of this nice gentleman.                             Cristopher Estimable. Lattie Haw, MD, [facility]    RMR/MedQ  DD:  02/25/2006  DT:  02/25/2006  Job #:  [id number]

## 2011-01-02 NOTE — H&P (Signed)
NAMERICKEY, SADOWSKI               ACCOUNT NO.:  [id number]   MEDICAL RECORD NO.:  [id number]         PATIENT TYPE:  AMB   LOCATION:                                FACILITY:  [facility]   PHYSICIAN:  R. Garfield Cornea, M.D. DATE OF BIRTH:  21-Oct-1934   DATE OF ADMISSION:  08/18/2005  DATE OF DISCHARGE:  LH                                HISTORY & PHYSICAL   CHIEF COMPLAINT/HISTORY:  See recent tenesmus and flare of diarrhea.  Mr.  Sergio Baker is a pleasant 76 year old Caucasian male, with now  approximately a 25-year history of left-sided UC.  He has been well-  maintained over the years on Pentasa.  Last colonoscopy was in 2004.  He was  found to have proctocolitis limited to the sigmoid colon.  Endoscopically,  however, pan segmental biopsies did demonstrate microscopic colitis but not  much in the way of active disease.  Again as stated above, he was in  remission until the past several weeks when he started having some tenesmus  and some blood-tinged diarrhea.  He was given __________enemas.  He was seen  here on August 04, 2005.  He was switched over to cortisone enemas.  He  has been taking Pentasa, a dose of 1.25 g t.i.d.  He is somewhat improved  but not nearly back to his baseline.  He is not taking any nonsteroidals, no  antibiotics recently, no fever or chills.  Reflux symptoms are well-  controlled on AcipHex.  He has seen a urologist for some urinary frequency  and urgency.  He is on Flomax.   PAST MEDICAL HISTORY:  1.  Significant for appendectomy.  2.  Tonsillectomy.  3.  Cyst removed from urinary bladder.  4.  No history of diabetes, hypertension, heart disease, pulmonary disease.   SOCIAL HISTORY:  He is a retired Furniture conservator/restorer.  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  He has one child.  No alcohol, no tobacco.   FAMILY HISTORY:  Negative for chronic GI or liver illness.  No history of  colorectal cancer or inflammatory bowel disease.   REVIEW OF  SYSTEMS:  No odynophagia, no dysphagia, no early satiety, no chest  pain, dyspnea, no change in weight.  No fever, chills.   PHYSICAL EXAMINATION:  GENERAL:  This is a pleasant, bearded, 76 year old  gentleman resting comfortably.  VITAL SIGNS:  Weight 170, height 5 feet 9 inches, temperature 98.4, blood  pressure 160/68, pulse 68.  SKIN:  Warm and dry.  There is no jaundice.  HEENT:  No scleral icterus.  Conjunctivae are pink.  Oral cavity:  No  lesions.  NECK:  JVD is not prominent.  CHEST:  Lungs are clear to auscultation.  CARDIAC:  Regular rate and rhythm without murmur, gallop, or rub.  ABDOMEN:  Nondistended.  Positive bowel sounds.  Soft, nontender.  No  __________mass or organomegaly.  EXTREMITIES:  Have no edema.  RECTAL:  Exam deferred until time of colonoscopy.   IMPRESSION:  [name] is a pleasant 76 year old gentleman with long-  standing left-sided UC.  Histologically he  has more of a pan colitis.  He  has done well in the past until recently.  He has had some notable  improvement with cortisone enemas, but is nowhere near back to his baseline.  He is now due for surveillance colonoscopy anyway.  I told him to go ahead  and do a colonoscopy in a couple of weeks once he has had a chance to take  seven more days' worth of cortisone enemas (this will finish out his  prescription).  Potential risks, benefits, and alternatives have been  reviewed and we will make further recommendations once endoscopic evaluation  has been performed.      Bridgette Habermann, M.D.  Electronically Signed     RMR/MEDQ  D:  08/18/2005  T:  08/18/2005  Job:  [id number]

## 2011-01-02 NOTE — Letter (Signed)
June 29, 2006    Chipper Herb, M.D.  [address] [place], [place] [address]   RE:  Sergio Baker, Sergio Baker  MRN:  [id number]  /  DOB:  1935-06-05   Dear Sergio Baker:   Sergio Baker is seen in the office today at his request. He has vague  symptoms that occur intermittently, most commonly when he is standing or  active. His description of these is as if he is in slow motion. With further  questioning, these appear to represent a wave of weakness or malaise.  Typically, he sits down and rests for up to a half hour with resolution. If  he is already seated, he simply remains seated. There is no true dizziness.  He has certainly not experienced loss of consciousness. There is no dyspnea.   He has had a flare of ulcerative bowel disease recently, managed by Dr.  Gala Romney with an increase in his dose of Pentasa. He has a history of PSVT, but  symptoms and arrhythmia have never definitely been correlated.   CURRENT MEDICATIONS:  1. Flomax 0.4 mg daily.  2. Finasteride 5 mg daily.  3. Aspirin 81 mg daily.  4. A number of nutraceuticals.  5. Pentasa 1000 mg q.i.d.   PHYSICAL EXAMINATION:  GENERAL:  A pleasant gentleman with a full white  beard in no acute distress.  VITAL SIGNS:  The weight is 170, 3 pounds more than 4 months ago. Blood  pressure 130/70, heart rate 85 and regular, respirations 16.  NECK:  No jugular venous distention; normal carotid upstrokes without  bruits.  LUNGS:  Clear.  CARDIAC:  Normal first and second heart sounds; modest systolic ejection  murmur.  ABDOMEN:  Soft and nontender; no organomegaly.  EXTREMITIES:  No edema.   EKG:  Sinus rhythm with frequent PVCs; left anterior fascicular block;  otherwise unremarkable.   IMPRESSION:  Sergio Baker once again has symptoms that are difficult to  define. Certainly, he could be experiencing paroxysmal arrhythmias. An event  recorder will be provided to him. We will also proceed with an  echocardiogram to evaluate his murmur  and to verify normal left ventricular  systolic function. I will see him again once these studies have been  completed.    Sincerely,      Cristopher Estimable. Lattie Haw, MD, [facility]  Electronically Signed    RMR/MedQ  DD: 06/29/2006  DT: 06/29/2006  Job #: [id number]

## 2011-01-02 NOTE — Procedures (Signed)
Sergio Baker, [name]               ACCOUNT NO.:  [id number]   MEDICAL RECORD NO.:  [id number]          PATIENT TYPE:  OUT   LOCATION:  RAD                           FACILITY:  [facility]   PHYSICIAN:  Cristopher Estimable. Lattie Haw, MD, FACCDATE OF BIRTH:  1935/06/13   DATE OF PROCEDURE:  07/02/2006  DATE OF DISCHARGE:                                  ECHOCARDIOGRAM   CLINICAL DATA:  A 76 year old gentleman with murmur and fatigue. Aorta 3.1  left septum 1.4, posterior wall 1.2, LV diastole 3.8, LV systole 2.6.   1. Technically adequate echocardiographic study.  2. Normal left and right atrial size; normal right ventricular size and      function; mild RVH.  3. Mild sclerosis of a trileaflet aortic valve.  4. Normal aortic root size; mild calcification of the wall and annulus.  5. Normal tricuspid valve; physiologic regurgitation.  6. Normal pulmonic valve and proximal pulmonary artery.  7. Slight mitral valve thickening with borderline prolapse, mild annular      calcification and no regurgitation.  8. Normal IVC.  9. Normal left ventricular size; mild concentric hypertrophy; normal      regional and global function.      Cristopher Estimable. Lattie Haw, MD, [facility]  Electronically Signed     RMR/MEDQ  D:  07/02/2006  T:  07/02/2006  Job:  [id number]

## 2011-01-02 NOTE — Procedures (Signed)
[facility]                                EXERCISE TREADMILL   DONNA, SILVERMAN                      MRN:          [id number]  DATE:02/25/2006                            DOB:          07/26/1935    REFERRING PHYSICIAN:  Dr. Laurance Flatten, [facility].   INDICATION:  A 76 year old gentleman with exertional dyspnea and  intolerance.   Treadmill exercise performed to a workload of 9 METS and a heart rate of  196, 129% of age-predicted maximum.  Exercise was stopped due to the  patient's very rapid heart rate despite the fact that he was only minimally  symptomatic.   Blood pressure increased from a resting value of 140/90 to 190/95 at peak  exercise, a normal response.   Towards peak exercise there was a rapid increase of heart rate from a sinus  tachycardia at a rate of approximately 150 to a rapid regular rhythm in  excess of 190.  The exact electrophysiologic mechanism of this SVT could not  be divined.  He reverted back to sinus tachycardia immediately with the  cessation of exercise.   BASELINE EKG:  Normal sinus rhythm with PVCs; within normal limits.   STRESS EKG:  By visual inspection of the tracings, no significant ST-segment  depression was apparent.  The computer-averaged report suggested 1-1.5 mm of  flat ST-segment depression in leads III and F at peak exercise.  In the  first tracing without significant artifact obtained 15 seconds following the  cessation of exercise, there clearly was no ST-segment depression.   IMPRESSION:  Negative graded exercise test revealing adequate exercise  tolerance and no significant exercise-induced ST-segment abnormalities.  Occasional premature ventricular contractions were present throughout.  Paroxysmal supraventricular tachycardia occurred at peak exercise but did  not result in any significant symptoms and reverted rapidly with the  cessation of exercise.                       Cristopher Estimable. Lattie Haw, MD, [facility]   RMR/MedQ  DD:  02/25/2006  DT:  02/25/2006  Job #:  [id number]

## 2011-03-12 ENCOUNTER — Other Ambulatory Visit: Payer: Self-pay | Admitting: Family Medicine

## 2011-03-12 DIAGNOSIS — M5432 Sciatica, left side: Secondary | ICD-10-CM

## 2011-03-18 ENCOUNTER — Ambulatory Visit
Admission: RE | Admit: 2011-03-18 | Discharge: 2011-03-18 | Disposition: A | Discharge status: Home / Self Care | Payer: Medicare Other | Source: Ambulatory Visit | Attending: Family Medicine | Admitting: Family Medicine

## 2011-03-18 DIAGNOSIS — M5432 Sciatica, left side: Secondary | ICD-10-CM

## 2011-05-07 ENCOUNTER — Other Ambulatory Visit: Payer: Self-pay | Admitting: Neurosurgery

## 2011-05-07 DIAGNOSIS — M47816 Spondylosis without myelopathy or radiculopathy, lumbar region: Secondary | ICD-10-CM

## 2011-05-11 LAB — CBC
MCHC: 35.3
MCV: 95.4
WBC: 5

## 2011-05-11 LAB — BASIC METABOLIC PANEL
CO2: 27
Calcium: 8.6
Chloride: 106
Glucose, Bld: 112 — ABNORMAL HIGH
Sodium: 138

## 2011-05-13 ENCOUNTER — Ambulatory Visit
Admission: RE | Admit: 2011-05-13 | Discharge: 2011-05-13 | Disposition: A | Discharge status: Home / Self Care | Payer: Medicare Other | Source: Ambulatory Visit | Attending: Neurosurgery | Admitting: Neurosurgery

## 2011-05-13 DIAGNOSIS — M47816 Spondylosis without myelopathy or radiculopathy, lumbar region: Secondary | ICD-10-CM

## 2011-05-13 MED ORDER — IOHEXOL 180 MG/ML  SOLN
1.0000 mL | Freq: Once | INTRAMUSCULAR | Status: AC | PRN
  Administered 2011-05-13: 1 mL via EPIDURAL

## 2011-05-13 MED ORDER — DIAZEPAM 2 MG PO TABS: 5.0000 mg | ORAL_TABLET | Freq: Once | ORAL | Status: DC

## 2011-05-13 MED ORDER — METHYLPREDNISOLONE ACETATE 40 MG/ML INJ SUSP (RADIOLOG
120.0000 mg | Freq: Once | INTRAMUSCULAR | Status: AC
  Administered 2011-05-13: 120 mg via EPIDURAL

## 2011-06-09 ENCOUNTER — Other Ambulatory Visit: Payer: Self-pay

## 2011-06-09 NOTE — Telephone Encounter (Signed)
Pt due for 1 yr FU OV Please arrange.

## 2011-06-09 NOTE — Telephone Encounter (Signed)
Please schedule ov 1 year follow up

## 2011-06-22 ENCOUNTER — Other Ambulatory Visit: Payer: Self-pay

## 2011-06-23 ENCOUNTER — Telehealth: Payer: Self-pay | Admitting: Internal Medicine

## 2011-06-23 NOTE — Telephone Encounter (Signed)
Pt is aware of OV for 11/7 at 10 with LSL

## 2011-06-23 NOTE — Telephone Encounter (Signed)
I called pt to set up OV with RMR only and also put reminder in epic to follow up in one year. Pt said he would call back the beginning of the year to set up OV and also asked if we took care of his prescription that he called about earlier.  [phone]

## 2011-06-23 NOTE — Telephone Encounter (Signed)
He is taking the pentasa 4 times a day. Manuela Schwartz can you please make him an appointment with Dr.Rourk and let him know when it is. Thanks

## 2011-06-23 NOTE — Telephone Encounter (Signed)
Please verify dose with patient.  Patient also need one year f/u visit.

## 2011-06-23 NOTE — Telephone Encounter (Signed)
I need to know if he is taking one capsule four times daily or two capsule four times daily.

## 2011-06-23 NOTE — Telephone Encounter (Signed)
Reminder in epic to have a follow up OV in one year

## 2011-06-23 NOTE — Telephone Encounter (Signed)
Pt needs ov to further refills please.

## 2011-06-24 ENCOUNTER — Ambulatory Visit (INDEPENDENT_AMBULATORY_CARE_PROVIDER_SITE_OTHER): Payer: Medicare Other | Admitting: Gastroenterology

## 2011-06-24 ENCOUNTER — Encounter: Payer: Self-pay | Admitting: Gastroenterology

## 2011-06-24 VITALS — BP 120/71 | HR 101 | Temp 97.5°F | Ht 66.0 in | Wt 176.4 lb

## 2011-06-24 DIAGNOSIS — K5120 Ulcerative (chronic) proctitis without complications: Secondary | ICD-10-CM

## 2011-06-24 MED ORDER — MESALAMINE ER 500 MG PO CPCR: 1000.0000 mg | ORAL_CAPSULE | Freq: Four times a day (QID) | ORAL | Status: DC

## 2011-06-24 NOTE — Progress Notes (Signed)
Cc to PCP 

## 2011-06-24 NOTE — Patient Instructions (Signed)
You will be due for follow-up colonoscopy in 02/2012. We will send you a reminder letter. Please call with any questions or concerns.

## 2011-06-24 NOTE — Progress Notes (Signed)
Primary Care Physician: Clois Dupes, MD, MD  Primary Gastroenterologist:  Garfield Cornea, MD   Chief Complaint  Patient presents with  . Medication Refill    HPI: Sergio Baker is a 76 y.o. male here for yearly followup. He has a history of ulcerative proctocolitis for nearly 30 years. His last colonoscopy was in July of 2011. No evidence of dysplasia. Next colonoscopy due July 2013. He is doing very well. BM 2 daily. Denies blood in the stool, or abdominal pain, tenesmus. His appetite is good. Denies heartburn, dysphagia. No weight loss. States he needs a refill on his Pentasa.  Current Outpatient Prescriptions  Medication Sig Dispense Refill  . fenofibrate 160 MG tablet Take 160 mg by mouth daily.       . haloperidol (HALDOL) 0.5 MG tablet Take 0.5 mg by mouth 2 (two) times daily.       . mesalamine (PENTASA) 500 MG CR capsule Take 500 mg by mouth 4 (four) times daily. 2 pills qid          Allergies as of 06/24/2011  . (No Known Allergies)    ROS:  General: Negative for anorexia, weight loss, fever, chills, fatigue, weakness. ENT: Negative for hoarseness, difficulty swallowing , nasal congestion. CV: Negative for chest pain, angina, palpitations, dyspnea on exertion, peripheral edema.  Respiratory: Negative for dyspnea at rest, dyspnea on exertion, cough, sputum, wheezing.  GI: See history of present illness. GU:  Negative for dysuria, hematuria, urinary incontinence, urinary frequency, nocturnal urination.  Endo: Negative for unusual weight change.    Physical Examination:   BP 120/71  Pulse 101  Temp(Src) 97.5 F (36.4 C) (Temporal)  Ht 5' 6"  (4.332 m)  Wt 176 lb 6.4 oz (80.015 kg)  BMI 28.47 kg/m2  General: Well-nourished, well-developed in no acute distress.  Eyes: No icterus. Mouth: Oropharyngeal mucosa moist and pink , no lesions erythema or exudate. Lungs: Clear to auscultation bilaterally.  Heart: Regular rate and rhythm, no murmurs rubs or gallops.    Abdomen: Bowel sounds are normal, nontender, nondistended, no hepatosplenomegaly or masses, no abdominal bruits or hernia , no rebound or guarding.   Extremities: No lower extremity edema. No clubbing or deformities. Neuro: Alert and oriented x 4   Skin: Warm and dry, no jaundice.   Psych: Alert and cooperative, normal mood and affect.   Imaging Studies: No results found.

## 2011-06-24 NOTE — Assessment & Plan Note (Signed)
Doing well. Continue Pentasa. Due for surveillance colonoscopy July 2013.

## 2011-06-24 NOTE — Telephone Encounter (Signed)
Pt is here today for an office visit.

## 2011-07-20 ENCOUNTER — Other Ambulatory Visit: Payer: Self-pay | Admitting: Neurosurgery

## 2011-07-20 DIAGNOSIS — M47816 Spondylosis without myelopathy or radiculopathy, lumbar region: Secondary | ICD-10-CM

## 2011-07-21 ENCOUNTER — Ambulatory Visit
Admission: RE | Admit: 2011-07-21 | Discharge: 2011-07-21 | Disposition: A | Discharge status: Home / Self Care | Payer: Medicare Other | Source: Ambulatory Visit | Attending: Neurosurgery | Admitting: Neurosurgery

## 2011-07-21 DIAGNOSIS — M47816 Spondylosis without myelopathy or radiculopathy, lumbar region: Secondary | ICD-10-CM

## 2011-07-21 MED ORDER — DIAZEPAM 5 MG PO TABS
5.0000 mg | ORAL_TABLET | Freq: Once | ORAL | Status: AC
  Administered 2011-07-21: 5 mg via ORAL

## 2011-07-21 MED ORDER — IOHEXOL 180 MG/ML  SOLN
1.0000 mL | Freq: Once | INTRAMUSCULAR | Status: AC | PRN
  Administered 2011-07-21: 1 mL via EPIDURAL

## 2011-07-21 MED ORDER — METHYLPREDNISOLONE ACETATE 40 MG/ML INJ SUSP (RADIOLOG
120.0000 mg | Freq: Once | INTRAMUSCULAR | Status: AC
  Administered 2011-07-21: 120 mg via EPIDURAL

## 2011-07-21 NOTE — Progress Notes (Signed)
Pt wanted valium pre-procedure due to his Huntington's disease. Tolerated injection well.

## 2012-01-06 ENCOUNTER — Other Ambulatory Visit: Payer: Self-pay | Admitting: Neurosurgery

## 2012-01-06 DIAGNOSIS — M47816 Spondylosis without myelopathy or radiculopathy, lumbar region: Secondary | ICD-10-CM

## 2012-01-08 ENCOUNTER — Ambulatory Visit
Admission: RE | Admit: 2012-01-08 | Discharge: 2012-01-08 | Disposition: A | Discharge status: Home / Self Care | Payer: Medicare Other | Source: Ambulatory Visit | Attending: Neurosurgery | Admitting: Neurosurgery

## 2012-01-08 VITALS — BP 165/82 | HR 66

## 2012-01-08 DIAGNOSIS — M47816 Spondylosis without myelopathy or radiculopathy, lumbar region: Secondary | ICD-10-CM

## 2012-01-08 MED ORDER — IOHEXOL 180 MG/ML  SOLN
1.0000 mL | Freq: Once | INTRAMUSCULAR | Status: AC | PRN
  Administered 2012-01-08: 1 mL via EPIDURAL

## 2012-01-08 MED ORDER — METHYLPREDNISOLONE ACETATE 40 MG/ML INJ SUSP (RADIOLOG
120.0000 mg | Freq: Once | INTRAMUSCULAR | Status: AC
  Administered 2012-01-08: 120 mg via EPIDURAL

## 2012-01-08 MED ORDER — DIAZEPAM 5 MG PO TABS
5.0000 mg | ORAL_TABLET | Freq: Once | ORAL | Status: AC
  Administered 2012-01-08: 5 mg via ORAL

## 2012-01-18 ENCOUNTER — Other Ambulatory Visit: Payer: Self-pay

## 2012-01-18 MED ORDER — MESALAMINE ER 500 MG PO CPCR: 1000.0000 mg | ORAL_CAPSULE | Freq: Four times a day (QID) | ORAL | Status: DC

## 2012-01-18 NOTE — Telephone Encounter (Signed)
Pt due for TCS July 2013

## 2012-01-18 NOTE — Telephone Encounter (Signed)
Reminder in epic to have [place] in July 2013

## 2012-02-23 ENCOUNTER — Ambulatory Visit (INDEPENDENT_AMBULATORY_CARE_PROVIDER_SITE_OTHER): Payer: Medicare Other | Admitting: Internal Medicine

## 2012-02-23 ENCOUNTER — Encounter: Payer: Self-pay | Admitting: Internal Medicine

## 2012-02-23 VITALS — BP 154/79 | HR 83 | Temp 97.6°F | Ht 67.0 in | Wt 173.8 lb

## 2012-02-23 DIAGNOSIS — K5732 Diverticulitis of large intestine without perforation or abscess without bleeding: Secondary | ICD-10-CM

## 2012-02-23 DIAGNOSIS — K219 Gastro-esophageal reflux disease without esophagitis: Secondary | ICD-10-CM

## 2012-02-23 DIAGNOSIS — Z8719 Personal history of other diseases of the digestive system: Secondary | ICD-10-CM

## 2012-02-23 DIAGNOSIS — K649 Unspecified hemorrhoids: Secondary | ICD-10-CM

## 2012-02-23 MED ORDER — PEG 3350-KCL-NA BICARB-NACL 420 G PO SOLR: ORAL | Status: AC

## 2012-02-23 NOTE — Progress Notes (Signed)
Primary Care Physician:  Redge Gainer, MD Primary Gastroenterologist:  Dr. Gala Romney  Pre-Procedure History & Physical: HPI:  Sergio Baker is a 77 y.o. male here for for setting up surveillance colonoscopy. 30+ year history of hand UC. Last colonoscopy couple of years ago. No dysplasia on biopsies. Has 1-2 bowel movements daily. No rectal bleeding Pentasa down to 2 g. Cut down from 4 g on his own as he was told this prescription was going to cost $1100. GERD well-controlled on Prilosec. History of Schatzki's ring previously dilated. Has rare occasional dysphagia to solids. No Barrett's esophagus seen on prior EGD.   Past Medical History  Diagnosis Date  . Proctocolitis, ulcerative     Diagnosed in the 1980s.  Marland Kitchen GERD (gastroesophageal reflux disease)   . A-fib   . Hemorrhoids   . Diverticula of colon   . Hiatal hernia     small  . Schatzki's ring   . Ulcerative colitis     left side    Past Surgical History  Procedure Date  . Appendectomy   . Tonsillectomy   . Cyst removed from urinary bladder   . Colonoscopy 02/2010    patchy erythema, minimal granularity in distal rectum, left-sided diverticula, ileal diverticulum.  Bx benign. next TCS 02/2012  . Esophagogastroduodenoscopy 04/2008    schatzki ring, s/p ED, hh    Prior to Admission medications   Medication Sig Start Date End Date Taking? Authorizing Provider  citalopram (CELEXA) 20 MG tablet Take 20 mg by mouth daily.  02/22/12  Yes Historical Provider, MD  fenofibrate 160 MG tablet Take 160 mg by mouth daily.  06/11/11  Yes Historical Provider, MD  haloperidol (HALDOL) 0.5 MG tablet Take 0.5 mg by mouth 2 (two) times daily.  06/05/11  Yes Historical Provider, MD  mesalamine (PENTASA) 500 MG CR capsule Take 2 capsules (1,000 mg total) by mouth 4 (four) times daily. 01/18/12  Yes Andria Meuse, NP  omeprazole (PRILOSEC) 20 MG capsule Take 20 mg by mouth daily.  02/15/12  Yes Historical Provider, MD    Allergies as of 02/23/2012  .  (No Known Allergies)    Family History  Problem Relation Age of Onset  . Lung cancer Mother 72    deceased  . Colon cancer Neg Hx     History   Social History  . Marital Status: Married    Spouse Name: N/A    Number of Children: N/A  . Years of Education: N/A   Occupational History  . retired Furniture conservator/restorer   . works as Administrator, [employer] at [employer]    Social History Main Topics  . Smoking status: Never Smoker   . Smokeless tobacco: Not on file  . Alcohol Use: No  . Drug Use: No  . Sexually Active: Not on file   Other Topics Concern  . Not on file   Social History Narrative  . No narrative on file    Review of Systems: See HPI, otherwise negative ROS  Physical Exam: BP 154/79  Pulse 83  Temp 97.6 F (36.4 C) (Temporal)  Ht 5' 7"  (1.702 m)  Wt 173 lb 12.8 oz (78.835 kg)  BMI 27.22 kg/m2 General:   Alert,  Well-developed, well-nourished,, bearded pleasant and cooperative in NAD Skin:  Intact without significant lesions or rashes. Eyes:  Sclera clear, no icterus.   Conjunctiva pink. Ears:  Normal auditory acuity. Nose:  No deformity, discharge,  or lesions. Mouth:  No deformity or lesions. Neck:  Supple; no  masses or thyromegaly. No significant cervical adenopathy. Lungs:  Clear throughout to auscultation.   No wheezes, crackles, or rhonchi. No acute distress. Heart:  Regular rate and rhythm; no murmurs, clicks, rubs,  or gallops. Abdomen: Non-distended, normal bowel sounds.  Soft and nontender without appreciable mass or hepatosplenomegaly.  Pulses:  Normal pulses noted. Extremities:  Without clubbing or edema.  Impression/Plan:  77 year old gentleman long-standing  Pan-ulcerative colitis now here for two-year surveillance colonoscopy. The risks, benefits, limitations, alternatives and imponderables have been reviewed with the patient. Questions have been answered. All parties are agreeable.   Reflux symptoms well controlled on Prilosec. Occasional dysphagia to  solids but no other alarm symptoms. Patient offered EGD with possible esophageal dilation. Wants to hold up for now. I told him to let me know if dysphagia worsens.  Will check with Dr. Tawanna Sat office to see if he's had a recent creatinine done.  For now, we'll allow him to continue Pentasa at a lower dose i.e. 2 g daily. I provided him with samples.

## 2012-02-23 NOTE — Patient Instructions (Addendum)
Will schedule a surveilance colonoscopy  Continue omeprazole for acid reflux  May continue pentasa at a dose of 2 grams daily for now  Basic metabolic profile

## 2012-02-29 NOTE — Progress Notes (Signed)
Opened in error

## 2012-03-10 ENCOUNTER — Encounter (HOSPITAL_COMMUNITY): Payer: Self-pay | Admitting: Pharmacy Technician

## 2012-03-23 MED ORDER — SODIUM CHLORIDE 0.45 % IV SOLN
Freq: Once | INTRAVENOUS | Status: AC
  Administered 2012-03-24: 1000 mL via INTRAVENOUS

## 2012-03-24 ENCOUNTER — Encounter (HOSPITAL_COMMUNITY)
Admission: RE | Disposition: A | Discharge status: Home / Self Care | Payer: Self-pay | Source: Ambulatory Visit | Attending: Internal Medicine

## 2012-03-24 ENCOUNTER — Encounter (HOSPITAL_COMMUNITY): Payer: Self-pay | Admitting: *Deleted

## 2012-03-24 ENCOUNTER — Ambulatory Visit (HOSPITAL_COMMUNITY)
Admission: RE | Admit: 2012-03-24 | Discharge: 2012-03-24 | Disposition: A | Discharge status: Home / Self Care | Payer: Medicare Other | Source: Ambulatory Visit | Attending: Internal Medicine | Admitting: Internal Medicine

## 2012-03-24 DIAGNOSIS — K5180 Other ulcerative colitis without complications: Secondary | ICD-10-CM | POA: Insufficient documentation

## 2012-03-24 DIAGNOSIS — Z1211 Encounter for screening for malignant neoplasm of colon: Secondary | ICD-10-CM

## 2012-03-24 DIAGNOSIS — K219 Gastro-esophageal reflux disease without esophagitis: Secondary | ICD-10-CM

## 2012-03-24 DIAGNOSIS — K5190 Ulcerative colitis, unspecified, without complications: Secondary | ICD-10-CM

## 2012-03-24 DIAGNOSIS — K649 Unspecified hemorrhoids: Secondary | ICD-10-CM

## 2012-03-24 DIAGNOSIS — K5732 Diverticulitis of large intestine without perforation or abscess without bleeding: Secondary | ICD-10-CM

## 2012-03-24 HISTORY — PX: COLONOSCOPY: SHX5424

## 2012-03-24 HISTORY — DX: Huntington's disease: G10

## 2012-03-24 SURGERY — COLONOSCOPY
Anesthesia: Moderate Sedation

## 2012-03-24 MED ORDER — MEPERIDINE HCL 100 MG/ML IJ SOLN
INTRAMUSCULAR | Status: DC | PRN
  Administered 2012-03-24: 25 mg via INTRAVENOUS
  Administered 2012-03-24: 50 mg via INTRAVENOUS

## 2012-03-24 MED ORDER — MIDAZOLAM HCL 5 MG/5ML IJ SOLN
INTRAMUSCULAR | Status: AC
  Filled 2012-03-24: qty 10

## 2012-03-24 MED ORDER — MEPERIDINE HCL 100 MG/ML IJ SOLN
INTRAMUSCULAR | Status: AC
  Filled 2012-03-24: qty 1

## 2012-03-24 MED ORDER — STERILE WATER FOR IRRIGATION IR SOLN
Status: DC | PRN
  Administered 2012-03-24: 11:00:00

## 2012-03-24 MED ORDER — MIDAZOLAM HCL 5 MG/5ML IJ SOLN
INTRAMUSCULAR | Status: DC | PRN
  Administered 2012-03-24: 2 mg via INTRAVENOUS
  Administered 2012-03-24: 1 mg via INTRAVENOUS
  Administered 2012-03-24: 2 mg via INTRAVENOUS

## 2012-03-24 NOTE — Op Note (Signed)
[facility] [address] [place], [place]  [address]  COLONOSCOPY PROCEDURE REPORT  PATIENT:  Sergio Baker, Sergio Baker  MR#:  [id number] BIRTHDATE:  [birth year], 47 yrs. old  GENDER:  male ENDOSCOPIST:  R. Garfield Cornea, MD FACP [facility] REF. BY:          Dr. Morrie Sheldon PROCEDURE DATE:  03/24/2012 PROCEDURE:  Surveillance colonoscopy with segmental biopsies  INDICATIONS:  30+ year history of panulcerative colitis. Clinically, in remission. Serum creatinine found  to be 0.94 through Dr. Tawanna Sat office                                 in March of this year.  INFORMED CONSENT:  The risks, benefits, alternatives and imponderables including but not limited to bleeding, perforation as well as the possibility of a missed lesion have been reviewed. The potential for biopsy, lesion removal, etc. have also been discussed.  Questions have been answered.  All parties agreeable. Please see the history and physical in the medical record for more information.  MEDICATIONS:  Versed 5 mg IV and Demerol 75 mg IV in divided doses.  DESCRIPTION OF PROCEDURE:  After a digital rectal exam was performed, the EC-[id number] ([id number]) colonoscope was advanced from the anus through the rectum and colon to the area of the cecum, ileocecal valve and appendiceal orifice.  The cecum was deeply intubated.  These structures were well-seen and photographed for the record.  From the level of the cecum and ileocecal valve, the scope was slowly and cautiously withdrawn.  The mucosal surfaces were carefully surveyed utilizing scope tip deflection to facilitate fold flattening as needed.  The scope was pulled down into the rectum where a thorough examination including retroflexion was performed. <<PROCEDUREIMAGES>>  FINDINGS: Suboptimal preparation. Granularity friability and some loss of the vascular pattern involving the distal 5 cm of rectal mucosa circumferentially. These inflammatory changes tapered off well into  the sigmoid/descending segments. From the proximal descending segment,  on to the cecum, the colonic mucosa appeared normal. There were no masses or polyps.  THERAPEUTIC / DIAGNOSTIC MANEUVERS PERFORMED:  Extensive segmental biopsies from the ascending segment through the rectum were taken for histologic study.  COMPLICATIONS:  None  CECAL WITHDRAWAL TIME:     12 minutes  IMPRESSION:   Inflammatory changes of the rectum and colon as outlined above consistent with history of ulcerative colitis-status post segmental biopsy. Again, clinically, patient is doing very well. His renal function is normal.  RECOMMENDATIONS:     Continue Pentasa at a dose to 2 g daily. Followup on pathology. Further recommendations to follow.  ______________________________ R. Garfield Cornea, MD Qu[facility] Ore  CC:  n. eSIGNED:   R. Garfield Cornea at 03/24/2012 11:32 AM  Seabron Spates, [phone]

## 2012-03-24 NOTE — Interval H&P Note (Signed)
History and Physical Interval Note:  03/24/2012 10:38 AM  Sergio Baker  has presented today for surgery, with the diagnosis of Hemorroids, GERD, Diverticula of the colon  The various methods of treatment have been discussed with the patient and family. After consideration of risks, benefits and other options for treatment, the patient has consented to  Procedure(s) (LRB): COLONOSCOPY (N/A) as a surgical intervention .  The patient's history has been reviewed, patient examined, no change in status, stable for surgery.  I have reviewed the patient's chart and labs.  Questions were answered to the patient's satisfaction.     Manus Rudd

## 2012-03-24 NOTE — H&P (View-Only) (Signed)
Primary Care Physician:  Redge Gainer, MD Primary Gastroenterologist:  Dr. Gala Romney  Pre-Procedure History & Physical: HPI:  Sergio Baker is a 77 y.o. male here for for setting up surveillance colonoscopy. 30+ year history of hand UC. Last colonoscopy couple of years ago. No dysplasia on biopsies. Has 1-2 bowel movements daily. No rectal bleeding Pentasa down to 2 g. Cut down from 4 g on his own as he was told this prescription was going to cost $1100. GERD well-controlled on Prilosec. History of Schatzki's ring previously dilated. Has rare occasional dysphagia to solids. No Barrett's esophagus seen on prior EGD.   Past Medical History  Diagnosis Date  . Proctocolitis, ulcerative     Diagnosed in the 1980s.  Marland Kitchen GERD (gastroesophageal reflux disease)   . A-fib   . Hemorrhoids   . Diverticula of colon   . Hiatal hernia     small  . Schatzki's ring   . Ulcerative colitis     left side    Past Surgical History  Procedure Date  . Appendectomy   . Tonsillectomy   . Cyst removed from urinary bladder   . Colonoscopy 02/2010    patchy erythema, minimal granularity in distal rectum, left-sided diverticula, ileal diverticulum.  Bx benign. next TCS 02/2012  . Esophagogastroduodenoscopy 04/2008    schatzki ring, s/p ED, hh    Prior to Admission medications   Medication Sig Start Date End Date Taking? Authorizing Provider  citalopram (CELEXA) 20 MG tablet Take 20 mg by mouth daily.  02/22/12  Yes Historical Provider, MD  fenofibrate 160 MG tablet Take 160 mg by mouth daily.  06/11/11  Yes Historical Provider, MD  haloperidol (HALDOL) 0.5 MG tablet Take 0.5 mg by mouth 2 (two) times daily.  06/05/11  Yes Historical Provider, MD  mesalamine (PENTASA) 500 MG CR capsule Take 2 capsules (1,000 mg total) by mouth 4 (four) times daily. 01/18/12  Yes Andria Meuse, NP  omeprazole (PRILOSEC) 20 MG capsule Take 20 mg by mouth daily.  02/15/12  Yes Historical Provider, MD    Allergies as of 02/23/2012  .  (No Known Allergies)    Family History  Problem Relation Age of Onset  . Lung cancer Mother 57    deceased  . Colon cancer Neg Hx     History   Social History  . Marital Status: Married    Spouse Name: N/A    Number of Children: N/A  . Years of Education: N/A   Occupational History  . retired Furniture conservator/restorer   . works as Administrator, [employer] at [employer]    Social History Main Topics  . Smoking status: Never Smoker   . Smokeless tobacco: Not on file  . Alcohol Use: No  . Drug Use: No  . Sexually Active: Not on file   Other Topics Concern  . Not on file   Social History Narrative  . No narrative on file    Review of Systems: See HPI, otherwise negative ROS  Physical Exam: BP 154/79  Pulse 83  Temp 97.6 F (36.4 C) (Temporal)  Ht 5' 7"  (1.702 m)  Wt 173 lb 12.8 oz (78.835 kg)  BMI 27.22 kg/m2 General:   Alert,  Well-developed, well-nourished,, bearded pleasant and cooperative in NAD Skin:  Intact without significant lesions or rashes. Eyes:  Sclera clear, no icterus.   Conjunctiva pink. Ears:  Normal auditory acuity. Nose:  No deformity, discharge,  or lesions. Mouth:  No deformity or lesions. Neck:  Supple; no  masses or thyromegaly. No significant cervical adenopathy. Lungs:  Clear throughout to auscultation.   No wheezes, crackles, or rhonchi. No acute distress. Heart:  Regular rate and rhythm; no murmurs, clicks, rubs,  or gallops. Abdomen: Non-distended, normal bowel sounds.  Soft and nontender without appreciable mass or hepatosplenomegaly.  Pulses:  Normal pulses noted. Extremities:  Without clubbing or edema.  Impression/Plan:  77 year old gentleman long-standing  Pan-ulcerative colitis now here for two-year surveillance colonoscopy. The risks, benefits, limitations, alternatives and imponderables have been reviewed with the patient. Questions have been answered. All parties are agreeable.   Reflux symptoms well controlled on Prilosec. Occasional dysphagia to  solids but no other alarm symptoms. Patient offered EGD with possible esophageal dilation. Wants to hold up for now. I told him to let me know if dysphagia worsens.  Will check with Dr. Tawanna Sat office to see if he's had a recent creatinine done.  For now, we'll allow him to continue Pentasa at a lower dose i.e. 2 g daily. I provided him with samples.

## 2012-03-27 ENCOUNTER — Encounter: Payer: Self-pay | Admitting: Internal Medicine

## 2012-03-28 ENCOUNTER — Encounter: Payer: Self-pay | Admitting: *Deleted

## 2012-03-29 ENCOUNTER — Encounter (HOSPITAL_COMMUNITY): Payer: Self-pay | Admitting: Internal Medicine

## 2012-04-25 ENCOUNTER — Telehealth: Payer: Self-pay | Admitting: *Deleted

## 2012-04-25 NOTE — Telephone Encounter (Signed)
Sergio Baker called today looking for some free samples of pentaza. Please call him back.

## 2012-04-25 NOTE — Telephone Encounter (Signed)
I called Pt to let him know that I have put some samples up front for him.

## 2012-05-10 ENCOUNTER — Other Ambulatory Visit: Payer: Self-pay | Admitting: Neurosurgery

## 2012-05-10 DIAGNOSIS — M47816 Spondylosis without myelopathy or radiculopathy, lumbar region: Secondary | ICD-10-CM

## 2012-05-11 ENCOUNTER — Ambulatory Visit
Admission: RE | Admit: 2012-05-11 | Discharge: 2012-05-11 | Disposition: A | Discharge status: Home / Self Care | Payer: Medicare Other | Source: Ambulatory Visit | Attending: Neurosurgery | Admitting: Neurosurgery

## 2012-05-11 VITALS — BP 149/78 | HR 64

## 2012-05-11 DIAGNOSIS — M47816 Spondylosis without myelopathy or radiculopathy, lumbar region: Secondary | ICD-10-CM

## 2012-05-11 MED ORDER — DIAZEPAM 5 MG PO TABS
5.0000 mg | ORAL_TABLET | Freq: Once | ORAL | Status: AC
  Administered 2012-05-11: 5 mg via ORAL

## 2012-05-11 MED ORDER — METHYLPREDNISOLONE ACETATE 40 MG/ML INJ SUSP (RADIOLOG
120.0000 mg | Freq: Once | INTRAMUSCULAR | Status: AC
  Administered 2012-05-11: 120 mg via EPIDURAL

## 2012-05-11 MED ORDER — IOHEXOL 180 MG/ML  SOLN
1.0000 mL | Freq: Once | INTRAMUSCULAR | Status: AC | PRN
  Administered 2012-05-11: 1 mL via EPIDURAL

## 2012-05-23 DIAGNOSIS — G10 Huntington's disease: Secondary | ICD-10-CM | POA: Insufficient documentation

## 2012-05-23 DIAGNOSIS — R269 Unspecified abnormalities of gait and mobility: Secondary | ICD-10-CM | POA: Insufficient documentation

## 2012-06-13 ENCOUNTER — Telehealth: Payer: Self-pay

## 2012-06-13 NOTE — Telephone Encounter (Signed)
pts wife-Linda, called- requesting pentasa samples for pt. #18 boxes of pentasa at front desk.

## 2012-06-13 NOTE — Telephone Encounter (Signed)
agree

## 2012-07-05 ENCOUNTER — Telehealth: Payer: Self-pay | Admitting: *Deleted

## 2012-07-05 NOTE — Telephone Encounter (Signed)
#  12 boxes of pentasa at the front desk. pts wife is aware.

## 2012-07-05 NOTE — Telephone Encounter (Signed)
Sergio Baker called for her husband, they need samples of pentasa. Please call her back. Thanks.

## 2012-08-04 ENCOUNTER — Other Ambulatory Visit: Payer: Self-pay | Admitting: Neurosurgery

## 2012-08-04 DIAGNOSIS — M47816 Spondylosis without myelopathy or radiculopathy, lumbar region: Secondary | ICD-10-CM

## 2012-08-09 ENCOUNTER — Other Ambulatory Visit: Payer: Medicare Other

## 2012-08-11 ENCOUNTER — Ambulatory Visit
Admission: RE | Admit: 2012-08-11 | Discharge: 2012-08-11 | Disposition: A | Discharge status: Home / Self Care | Payer: Medicare Other | Source: Ambulatory Visit | Attending: Neurosurgery | Admitting: Neurosurgery

## 2012-08-11 ENCOUNTER — Other Ambulatory Visit: Payer: Medicare Other

## 2012-08-11 VITALS — BP 171/79 | HR 78

## 2012-08-11 DIAGNOSIS — M5126 Other intervertebral disc displacement, lumbar region: Secondary | ICD-10-CM

## 2012-08-11 DIAGNOSIS — M47816 Spondylosis without myelopathy or radiculopathy, lumbar region: Secondary | ICD-10-CM

## 2012-08-11 MED ORDER — DIAZEPAM 5 MG PO TABS
5.0000 mg | ORAL_TABLET | Freq: Once | ORAL | Status: AC
  Administered 2012-08-11: 5 mg via ORAL

## 2012-08-11 MED ORDER — IOHEXOL 180 MG/ML  SOLN
1.0000 mL | Freq: Once | INTRAMUSCULAR | Status: AC | PRN
  Administered 2012-08-11: 1 mL via EPIDURAL

## 2012-08-11 MED ORDER — METHYLPREDNISOLONE ACETATE 40 MG/ML INJ SUSP (RADIOLOG
120.0000 mg | Freq: Once | INTRAMUSCULAR | Status: AC
  Administered 2012-08-11: 120 mg via EPIDURAL

## 2012-08-12 ENCOUNTER — Telehealth: Payer: Self-pay | Admitting: Internal Medicine

## 2012-08-12 NOTE — Telephone Encounter (Signed)
Boxes of Pentasa samples at front for pt to pick up  ( #18 boxes). Tried to call pt to pick up. Line was busy.

## 2012-08-12 NOTE — Telephone Encounter (Signed)
Pt's wife called to see if patient could get Pentasa samples. I told her that the nurse would check on that and call her back. [phone]

## 2012-08-12 NOTE — Telephone Encounter (Signed)
Patient's wife is aware of samples. I told her that we would be leaving today at noon or she can come Monday before 5pm. She agreed

## 2012-08-15 MED ORDER — MESALAMINE ER 500 MG PO CPCR: 500.0000 mg | ORAL_CAPSULE | Freq: Four times a day (QID) | ORAL | Status: DC

## 2012-08-15 NOTE — Addendum Note (Signed)
Addended by: Andria Meuse on: 08/15/2012 05:06 PM   Modules accepted: Orders

## 2012-08-15 NOTE — Addendum Note (Signed)
Addended by: Everardo All on: 08/15/2012 05:00 PM   Modules accepted: Orders

## 2012-08-15 NOTE — Telephone Encounter (Signed)
Routing to Vickey Huger, NP to sign off on.

## 2012-09-19 ENCOUNTER — Telehealth: Payer: Self-pay | Admitting: Internal Medicine

## 2012-09-19 MED ORDER — MESALAMINE ER 500 MG PO CPCR: 500.0000 mg | ORAL_CAPSULE | Freq: Four times a day (QID) | ORAL | Status: DC

## 2012-09-19 NOTE — Telephone Encounter (Signed)
Please advise if patient can have Pentasa samples since its been over 6 months. [phone]

## 2012-09-19 NOTE — Telephone Encounter (Signed)
Is it ok to give pt samples of pentasa?

## 2012-09-19 NOTE — Telephone Encounter (Signed)
Samples at the front desk. Pt is aware.

## 2012-09-19 NOTE — Telephone Encounter (Signed)
May give Pentasa samples #120 as below.

## 2012-10-08 ENCOUNTER — Encounter: Payer: Self-pay | Admitting: Neurology

## 2012-10-08 DIAGNOSIS — R269 Unspecified abnormalities of gait and mobility: Secondary | ICD-10-CM

## 2012-10-08 DIAGNOSIS — G10 Huntington's disease: Secondary | ICD-10-CM

## 2012-11-03 ENCOUNTER — Telehealth: Payer: Self-pay

## 2012-11-03 NOTE — Telephone Encounter (Signed)
pts wife called- left voicemail- requesting samples of pentasa. Is it ok to give pt samples?

## 2012-11-03 NOTE — Telephone Encounter (Signed)
Yes, may give Pentasa 563m 2 po BID,#30 day supply if we have. Thanks

## 2012-11-04 NOTE — Telephone Encounter (Signed)
Samples at the front desk and pts wife is aware.

## 2012-11-08 ENCOUNTER — Other Ambulatory Visit: Payer: Self-pay | Admitting: Neurosurgery

## 2012-11-08 ENCOUNTER — Other Ambulatory Visit: Payer: Self-pay

## 2012-11-08 DIAGNOSIS — M47816 Spondylosis without myelopathy or radiculopathy, lumbar region: Secondary | ICD-10-CM

## 2012-11-08 MED ORDER — OMEPRAZOLE 20 MG PO CPDR: 20.0000 mg | DELAYED_RELEASE_CAPSULE | Freq: Every day | ORAL | Status: DC

## 2012-11-09 ENCOUNTER — Ambulatory Visit
Admission: RE | Admit: 2012-11-09 | Discharge: 2012-11-09 | Disposition: A | Discharge status: Home / Self Care | Payer: Medicare Other | Source: Ambulatory Visit | Attending: Neurosurgery | Admitting: Neurosurgery

## 2012-11-09 VITALS — BP 151/82 | HR 75

## 2012-11-09 DIAGNOSIS — M47816 Spondylosis without myelopathy or radiculopathy, lumbar region: Secondary | ICD-10-CM

## 2012-11-09 MED ORDER — IOHEXOL 180 MG/ML  SOLN
1.0000 mL | Freq: Once | INTRAMUSCULAR | Status: AC | PRN
  Administered 2012-11-09: 1 mL via EPIDURAL

## 2012-11-09 MED ORDER — METHYLPREDNISOLONE ACETATE 40 MG/ML INJ SUSP (RADIOLOG
120.0000 mg | Freq: Once | INTRAMUSCULAR | Status: AC
  Administered 2012-11-09: 120 mg via EPIDURAL

## 2012-11-09 MED ORDER — DIAZEPAM 5 MG PO TABS
5.0000 mg | ORAL_TABLET | Freq: Once | ORAL | Status: AC
  Administered 2012-11-09: 5 mg via ORAL

## 2012-11-21 ENCOUNTER — Ambulatory Visit (INDEPENDENT_AMBULATORY_CARE_PROVIDER_SITE_OTHER): Payer: Medicare Other | Admitting: Neurology

## 2012-11-21 ENCOUNTER — Encounter: Payer: Self-pay | Admitting: Neurology

## 2012-11-21 VITALS — BP 126/78 | HR 78 | Ht 66.5 in | Wt 172.0 lb

## 2012-11-21 DIAGNOSIS — R269 Unspecified abnormalities of gait and mobility: Secondary | ICD-10-CM

## 2012-11-21 DIAGNOSIS — G10 Huntington's disease: Secondary | ICD-10-CM

## 2012-11-21 MED ORDER — HALOPERIDOL 1 MG PO TABS: 1.5000 mg | ORAL_TABLET | Freq: Two times a day (BID) | ORAL | Status: DC

## 2012-11-21 NOTE — Patient Instructions (Signed)
Huntington's Disease Huntington's disease (HD) results from genetically programmed degeneration of brain cells (neurons) in certain areas of the brain. This degeneration causes uncontrolled movements, loss of intellectual faculties, and emotional disturbance.  CAUSES  HD is a familial disease. It is passed from parent to child through a mutation in the normal gene. Each child of an HD parent has a 50-50 chance of inheriting the HD gene. If a child does not inherit the HD gene, he or she will not develop the disease. Also, he/she cannot pass it to future generations. But a person who inherits the HD gene will sooner or later develop the disease. Whether one child inherits the gene has no bearing on whether others will inherit it. SYMPTOMS  Some early problems (symptoms) include:  Mood swings.  Depression.  Irritability.  Trouble with the following:  Driving.  Learning new things.  Remembering a fact.  Making decisions. As the disease progresses, concentration on intellectual tasks becomes difficult. The patient may have difficulty feeding himself or herself and swallowing. The rate of disease progression and the age of onset vary from person to person.  DIAGNOSIS  Caregivers may diagnose HD by using:  A genetic test.  A complete medical history.  Neurological and laboratory tests. Presymptomatic testing is available for those who are at risk for carrying the HD gene. In 1 to 3 percent of individuals with HD, no family history of HD can be found. TREATMENT  Caregivers prescribe a number of medications. These help control emotional and movement problems associated with HD. Most drugs used to treat the symptoms of HD have side effects. They include:   Fatigue.  Restlessness.  Hyperexcitability. It is important for people with HD to maintain physical fitness as much as possible. Individuals who exercise and keep active tend to do better than those who do not. At this time, there  is no way to stop or reverse the course of HD. Now that the HD gene has been located, investigators continue to study it. They want to understand how it cause disease in the human body. Document Released: 07/24/2002 Document Revised: 10/26/2011 Document Reviewed: 08/03/2005 [facility] Patient Information 2013 [place].

## 2012-11-21 NOTE — Progress Notes (Signed)
Reason for visit: Huntington's disease  Sergio Baker is an 78 y.o. male  History of present illness:  Sergio Baker is a 78 year old right-handed white male with a history of Huntington's disease. The patient has had an occasional fall since last seen. The last fall was 2 months ago. The wife indicates that she believes that the gait disturbance has worsened some. The patient is walking with his feet wide apart. The patient denies any numbness in the feet. The patient returns for an evaluation. The patient does not use a cane for ambulation. The patient does not report any problems with swallowing or choking. The patient indicates that he sleeps in a recliner at night. The patient has bilateral shoulder issues, and sleeping in the bed worsens this. The patient reports excessive daytime drowsiness, and he does snore at night. The wife has not noted episodes of apnea.  Past Medical History  Diagnosis Date  . Proctocolitis, ulcerative     Diagnosed in the 1980s.  Marland Kitchen GERD (gastroesophageal reflux disease)   . A-fib   . Hemorrhoids   . Diverticula of colon   . Hiatal hernia     small  . Schatzki's ring   . Ulcerative colitis     left side  . Huntington's disease 2010  . Depression   . Arthritis   . Enlarged prostate   . Joint pain   . Hearing loss   . Abnormality of gait     Past Surgical History  Procedure Laterality Date  . Appendectomy    . Tonsillectomy    . Cyst removed from urinary bladder    . Colonoscopy  02/2010    patchy erythema, minimal granularity in distal rectum, left-sided diverticula, ileal diverticulum.  Bx benign. next TCS 02/2012  . Esophagogastroduodenoscopy  04/2008    schatzki ring, s/p ED, hh  . Colonoscopy  03/24/2012    Procedure: COLONOSCOPY;  Surgeon: Daneil Dolin, MD;  Location: [facility];  Service: Endoscopy;  Laterality: N/A;  10:45  . Shoulder arthroscopy w/ rotator cuff repair      Family History  Problem Relation Age of Onset  . Lung  cancer Mother 61    deceased  . Colon cancer Neg Hx   . Other Brother     Posttraumatic stress disorder  . Huntington's disease Daughter     Social history:  reports that he has never smoked. He does not have any smokeless tobacco history on file. He reports that he does not drink alcohol or use illicit drugs.  Allergies: No Known Allergies  Medications:  Current Outpatient Prescriptions on File Prior to Visit  Medication Sig Dispense Refill  . aspirin 81 MG tablet Take 81 mg by mouth daily.      . citalopram (CELEXA) 20 MG tablet Take 20 mg by mouth daily.       . fenofibrate 160 MG tablet Take 160 mg by mouth daily.       . mesalamine (PENTASA) 500 MG CR capsule Take 1 capsule (500 mg total) by mouth 4 (four) times daily.  120 capsule  0  . Multiple Vitamin (MULTIVITAMIN WITH MINERALS) TABS Take 1 tablet by mouth daily.      Marland Kitchen omeprazole (PRILOSEC) 20 MG capsule Take 1 capsule (20 mg total) by mouth daily.  30 capsule  1  . rosuvastatin (CRESTOR) 20 MG tablet Take 20 mg by mouth daily.       No current facility-administered medications on file prior to visit.  ROS:  Out of a complete 14 system review of symptoms, the patient complains only of the following symptoms, and all other reviewed systems are negative.  Hearing loss Moles Snoring  Impotence joint pain, joint swelling, aching muscles Memory loss Snoring Too much sleep, decreased energy  Blood pressure 126/78, pulse 78, height 5' 6.5" (1.689 m), weight 172 lb (78.019 kg).  Physical Exam  General: The patient is alert and cooperative at the time of the examination.  Musculoskeletal: The patient has incomplete abduction of the left arm, normal on the right.  Skin: No significant peripheral edema is noted.   Neurologic Exam  Cranial nerves: Facial symmetry is present. Speech is normal, no aphasia or dysarthria is noted. Extraocular movements are full. Visual fields are full.  Motor: The patient has good  strength in all 4 extremities.  Coordination: The patient has good finger-nose-finger and heel-to-shin bilaterally. Choreoathetotic movements are noted with the arms and legs.  Gait and station: The patient has a wide-based gait. Tandem gait is slightly unsteady. Romberg is negative. No drift is seen.  Reflexes: Deep tendon reflexes are symmetric.   Assessment/Plan:  One. Huntington's disease  2. Gait disorder  3. Excessive daytime drowsiness  The patient appears to have some choreoathetotic movements on clinical examination. The gait is wide-based. The patient will be sent for some blood work today. The Haldol will be increased to 1.5 mg twice daily to see if this improves his ability to ambulate. The patient may need to begin to use a cane for ambulation.  Jill Alexanders MD 11/21/2012 8:42 PM  [facility] [address] [place], [place] [address]  Phone [phone] Fax [phone]

## 2012-11-22 ENCOUNTER — Telehealth: Payer: Self-pay | Admitting: Neurology

## 2012-11-22 LAB — VITAMIN B12: Vitamin B-12: 607 pg/mL (ref 211–946)

## 2012-11-22 NOTE — Telephone Encounter (Signed)
I called patient and I left a message. The blood work is relatively unremarkable, TSH is slightly elevated, will need to be followed. Vitamin B12 level is normal.

## 2012-12-20 ENCOUNTER — Other Ambulatory Visit: Payer: Self-pay

## 2012-12-20 MED ORDER — CITALOPRAM HYDROBROMIDE 20 MG PO TABS: 20.0000 mg | ORAL_TABLET | Freq: Every day | ORAL | Status: DC

## 2012-12-20 NOTE — Telephone Encounter (Signed)
Last seen 07/08/12

## 2012-12-26 ENCOUNTER — Other Ambulatory Visit (INDEPENDENT_AMBULATORY_CARE_PROVIDER_SITE_OTHER): Payer: Medicare Other

## 2012-12-26 DIAGNOSIS — E785 Hyperlipidemia, unspecified: Secondary | ICD-10-CM

## 2012-12-26 DIAGNOSIS — I10 Essential (primary) hypertension: Secondary | ICD-10-CM

## 2012-12-26 LAB — COMPLETE METABOLIC PANEL WITH GFR
AST: 20 U/L (ref 0–37)
Albumin: 4.4 g/dL (ref 3.5–5.2)
BUN: 20 mg/dL (ref 6–23)
CO2: 28 mEq/L (ref 19–32)
Calcium: 9.9 mg/dL (ref 8.4–10.5)
Chloride: 105 mEq/L (ref 96–112)
Creat: 0.9 mg/dL (ref 0.50–1.35)
GFR, Est African American: >89 mL/min
GFR, Est Non African American: 83 mL/min
Glucose, Bld: 107 mg/dL — ABNORMAL HIGH (ref 70–99)
Potassium: 5 mEq/L (ref 3.5–5.3)

## 2012-12-26 NOTE — Progress Notes (Signed)
Patient came in for labs only.

## 2012-12-27 LAB — NMR LIPOPROFILE WITH LIPIDS
Cholesterol, Total: 143 mg/dL (ref <200)
LDL (calc): 69 mg/dL (ref <100)
LDL Particle Number: 757 nmol/L (ref <1000)
LDL Size: 19.8 nm — ABNORMAL LOW (ref >20.5)
LP-IR Score: 51 — ABNORMAL HIGH (ref <=45)
Large VLDL-P: 3.3 nmol/L — ABNORMAL HIGH (ref <=2.7)
Small LDL Particle Number: 555 nmol/L — ABNORMAL HIGH (ref <=527)
VLDL Size: 46.5 nm (ref <=46.6)

## 2012-12-28 ENCOUNTER — Telehealth: Payer: Self-pay | Admitting: *Deleted

## 2012-12-28 ENCOUNTER — Other Ambulatory Visit: Payer: Self-pay | Admitting: Nurse Practitioner

## 2012-12-28 MED ORDER — LEVOTHYROXINE SODIUM 50 MCG PO TABS: 50.0000 ug | ORAL_TABLET | Freq: Every day | ORAL | Status: DC

## 2012-12-28 NOTE — Telephone Encounter (Signed)
Pt wants mmm to review thyroid labs done at neuro. Office on 11/21/12- and see if mmm has any concerns

## 2012-12-28 NOTE — Telephone Encounter (Signed)
TSH 5.2- Add levothyroxine 50MCG - will send rx to pharmacy

## 2012-12-28 NOTE — Telephone Encounter (Signed)
PATIENT AWARE

## 2013-01-10 ENCOUNTER — Ambulatory Visit
Admission: RE | Admit: 2013-01-10 | Discharge: 2013-01-10 | Disposition: A | Discharge status: Home / Self Care | Payer: Medicare Other | Source: Ambulatory Visit | Attending: Neurosurgery | Admitting: Neurosurgery

## 2013-01-10 ENCOUNTER — Other Ambulatory Visit: Payer: Self-pay | Admitting: Neurosurgery

## 2013-01-10 DIAGNOSIS — M47817 Spondylosis without myelopathy or radiculopathy, lumbosacral region: Secondary | ICD-10-CM

## 2013-02-08 ENCOUNTER — Other Ambulatory Visit (INDEPENDENT_AMBULATORY_CARE_PROVIDER_SITE_OTHER): Payer: Medicare Other

## 2013-02-08 DIAGNOSIS — E039 Hypothyroidism, unspecified: Secondary | ICD-10-CM

## 2013-02-08 LAB — THYROID PANEL WITH TSH
T3 Uptake: 39.2 % — ABNORMAL HIGH (ref 22.5–37.0)
T4, Total: 8.1 ug/dL (ref 5.0–12.5)
TSH: 1.261 u[IU]/mL (ref 0.350–4.500)

## 2013-02-08 NOTE — Progress Notes (Signed)
Patient came in for labs only.

## 2013-02-14 ENCOUNTER — Telehealth: Payer: Self-pay

## 2013-02-14 NOTE — Telephone Encounter (Signed)
pts wife called requesting samples- gave #18 boxes of pentasa.

## 2013-02-15 NOTE — Telephone Encounter (Signed)
Sergio Baker, please schedule follow up appt.

## 2013-02-15 NOTE — Telephone Encounter (Signed)
Noted.  He needs to schedule his yearly f/u of UC.

## 2013-02-21 NOTE — Telephone Encounter (Signed)
Pt is aware of OV on 8/4 at 10 with [facility] and appt card was mailed

## 2013-02-22 ENCOUNTER — Telehealth: Payer: Self-pay | Admitting: Neurology

## 2013-02-22 NOTE — Telephone Encounter (Signed)
Dr. Carloyn Manner called concerning this patient. He is planning on doing lumbosacral spine surgery, and he wants to know if there are any neurologic contraindications for surgery given the history of Huntington's disease. I do not believe that there are any contraindications for general anesthesia.

## 2013-02-24 ENCOUNTER — Telehealth: Payer: Self-pay | Admitting: Nurse Practitioner

## 2013-02-24 NOTE — Telephone Encounter (Signed)
Patient notified no samples available

## 2013-03-20 ENCOUNTER — Ambulatory Visit: Payer: Medicare Other | Admitting: Gastroenterology

## 2013-03-31 ENCOUNTER — Ambulatory Visit (INDEPENDENT_AMBULATORY_CARE_PROVIDER_SITE_OTHER): Payer: Medicare Other | Admitting: Neurology

## 2013-03-31 ENCOUNTER — Encounter: Payer: Self-pay | Admitting: Neurology

## 2013-03-31 VITALS — BP 149/73 | HR 97 | Wt 177.0 lb

## 2013-03-31 DIAGNOSIS — G10 Huntington's disease: Secondary | ICD-10-CM

## 2013-03-31 DIAGNOSIS — R269 Unspecified abnormalities of gait and mobility: Secondary | ICD-10-CM

## 2013-03-31 NOTE — Progress Notes (Signed)
Reason for visit: Huntington's disease  Sergio Baker is an 78 y.o. male  History of present illness:  Sergio Baker is a 78 year old right-handed white male with a history of Huntington's disease. The patient recently underwent lumbosacral spine surgery by Dr. Carloyn Manner, and he has done quite well, indicating that he has no low back pain at this time. The patient is using a cane for ambulation. The patient is in physical therapy. The patient is on Haldol taking 1.5 mg twice daily. The patient is doing relatively well with this. The patient denies any problems with speech or swallowing. The patient has developed some problems with excessive daytime drowsiness within the last 6 months. The patient does not relate this change with an increase in the Haldol dosing. The patient indicates that he is doing well with his current dose.  Past Medical History  Diagnosis Date  . Proctocolitis, ulcerative     Diagnosed in the 1980s.  Marland Kitchen GERD (gastroesophageal reflux disease)   . A-fib   . Hemorrhoids   . Diverticula of colon   . Hiatal hernia     small  . Schatzki's ring   . Ulcerative colitis     left side  . Huntington's disease 2010  . Depression   . Arthritis   . Enlarged prostate   . Joint pain   . Hearing loss   . Abnormality of gait     Past Surgical History  Procedure Laterality Date  . Appendectomy    . Tonsillectomy    . Cyst removed from urinary bladder    . Colonoscopy  02/2010    patchy erythema, minimal granularity in distal rectum, left-sided diverticula, ileal diverticulum.  Bx benign. next TCS 02/2012  . Esophagogastroduodenoscopy  04/2008    schatzki ring, s/p ED, hh  . Colonoscopy  03/24/2012    Procedure: COLONOSCOPY;  Surgeon: Daneil Dolin, MD;  Location: [facility];  Service: Endoscopy;  Laterality: N/A;  10:45  . Shoulder arthroscopy w/ rotator cuff repair    . Lumbar fusion      Family History  Problem Relation Age of Onset  . Lung cancer Mother 54    deceased   . Colon cancer Neg Hx   . Other Brother     Posttraumatic stress disorder  . Huntington's disease Daughter     Social history:  reports that he has never smoked. He does not have any smokeless tobacco history on file. He reports that he does not drink alcohol or use illicit drugs.  Allergies: No Known Allergies  Medications:  Current Outpatient Prescriptions on File Prior to Visit  Medication Sig Dispense Refill  . aspirin 81 MG tablet Take 81 mg by mouth daily.      Raelyn Ensign Pollen 550 MG CAPS Take 1 tablet by mouth.      . Calcium Carbonate-Vitamin D (CALCIUM 600/VITAMIN D PO) Take 1 tablet by mouth daily.      . Cholecalciferol (VITAMIN D3) 2000 UNITS capsule Take 2,000 Units by mouth daily.      . citalopram (CELEXA) 20 MG tablet Take 1 tablet (20 mg total) by mouth daily.  30 tablet  0  . fenofibrate 160 MG tablet Take 160 mg by mouth daily.       Marland Kitchen glucosamine-chondroitin 500-400 MG tablet Take 1 tablet by mouth daily.      . haloperidol (HALDOL) 1 MG tablet Take 1.5 tablets (1.5 mg total) by mouth 2 (two) times daily.  90 tablet  5  . levothyroxine (SYNTHROID, LEVOTHROID) 50 MCG tablet Take 1 tablet (50 mcg total) by mouth daily.  90 tablet  1  . mesalamine (PENTASA) 500 MG CR capsule Take 1 capsule (500 mg total) by mouth 4 (four) times daily.  120 capsule  0  . Methylsulfonylmethane 1000 MG CAPS Take 1 capsule by mouth daily.      . Multiple Vitamin (MULTIVITAMIN WITH MINERALS) TABS Take 1 tablet by mouth daily.      . Omega-3 Fatty Acids 1200 MG CAPS Take 1,200 capsules by mouth daily.      Marland Kitchen omeprazole (PRILOSEC) 20 MG capsule Take 1 capsule (20 mg total) by mouth daily.  30 capsule  1  . rosuvastatin (CRESTOR) 20 MG tablet Take 20 mg by mouth daily.       No current facility-administered medications on file prior to visit.    ROS:  Out of a complete 14 system review of symptoms, the patient complains only of the following symptoms, and all other reviewed systems are  negative.  Memory loss Excessive daytime drowsiness Restless legs Hearing loss  Blood pressure 149/73, pulse 97, weight 177 lb (80.287 kg).  Physical Exam  General: The patient is alert and cooperative at the time of the examination. The patient is moderately obese.  Skin: No significant peripheral edema is noted.   Neurologic Exam  Cranial nerves: Facial symmetry is present. Speech is normal, no aphasia or dysarthria is noted. Extraocular movements are full. Visual fields are full.  Motor: The patient has good strength in all 4 extremities. The patient has incomplete abduction of the left arm.  Coordination: The patient has good finger-nose-finger and heel-to-shin bilaterally. The patient has frequent choreoathetotic movements of the body, arms, and legs.  Gait and station: The patient has a slightly unsteady gait. The patient uses a cane for ambulation. Tandem gait is unsteady. Romberg is negative. No drift is seen.  Reflexes: Deep tendon reflexes are symmetric.   Assessment/Plan:  1. Huntington's disease  2. Gait disorder  3. Excessive daytime drowsiness  The patient is developing some increase in drowsiness during the day. The patient's wife indicates that he does not snore at night. We may consider a sleep study in the future if this worsens. The patient will remain on his current dose of Haldol, and he will followup in about 6 months.  Jill Alexanders MD 04/02/2013 2:04 PM  [facility] [address] [place], [place] [address]  Phone [phone] Fax [phone]

## 2013-04-10 ENCOUNTER — Telehealth: Payer: Self-pay

## 2013-04-10 NOTE — Telephone Encounter (Signed)
pts wife called requesting samples of pentasa. Gave #18 boxes. He is scheduled for 05/05/13 with RMR. Put reminder card in bag with samples.

## 2013-04-10 NOTE — Telephone Encounter (Signed)
Noted  

## 2013-04-25 ENCOUNTER — Other Ambulatory Visit: Payer: Self-pay

## 2013-04-25 MED ORDER — CITALOPRAM HYDROBROMIDE 20 MG PO TABS: 20.0000 mg | ORAL_TABLET | Freq: Every day | ORAL | Status: DC

## 2013-04-25 NOTE — Telephone Encounter (Signed)
Not seen in EPIC  Last filled 12/20/12

## 2013-05-05 ENCOUNTER — Ambulatory Visit (INDEPENDENT_AMBULATORY_CARE_PROVIDER_SITE_OTHER): Payer: Medicare Other | Admitting: Internal Medicine

## 2013-05-05 ENCOUNTER — Encounter: Payer: Self-pay | Admitting: Internal Medicine

## 2013-05-05 ENCOUNTER — Ambulatory Visit: Payer: Medicare Other | Admitting: Neurology

## 2013-05-05 VITALS — BP 151/79 | HR 81 | Temp 97.9°F | Ht 68.0 in | Wt 176.6 lb

## 2013-05-05 DIAGNOSIS — R197 Diarrhea, unspecified: Secondary | ICD-10-CM

## 2013-05-05 DIAGNOSIS — K5190 Ulcerative colitis, unspecified, without complications: Secondary | ICD-10-CM

## 2013-05-05 NOTE — Progress Notes (Signed)
Primary Care Physician:  Redge Gainer, MD Primary Gastroenterologist:  Dr. Gala Romney  Pre-Procedure History & Physical: HPI:  Sergio Baker is a 78 y.o. male here for followup of long-standing proctocolitis. Colonoscopy last year revealed endoscopic changes of mild inflammation involving the distal rectum only. Segmental biopsies demonstrated normal colonic and rectal mucosa  -   Has done well on low-dose Pentasa until about 4 months ago. He had spine surgery 4 months ago , thereafter, developed watery nonbloody diarrhea. He's gone from one formed bowel movement a day to 6 watery bowel movements daily. No abdominal pain. Has has had no nausea, vomiting or fever. No sick contacts. No travel. No one else around is ill.  Past Medical History  Diagnosis Date  . Proctocolitis, ulcerative     Diagnosed in the 1980s.  Marland Kitchen GERD (gastroesophageal reflux disease)   . A-fib   . Hemorrhoids   . Diverticula of colon   . Hiatal hernia     small  . Schatzki's ring   . Ulcerative colitis     left side  . Huntington's disease 2010  . Depression   . Arthritis   . Enlarged prostate   . Joint pain   . Hearing loss   . Abnormality of gait     Past Surgical History  Procedure Laterality Date  . Appendectomy    . Tonsillectomy    . Cyst removed from urinary bladder    . Colonoscopy  02/2010    patchy erythema, minimal granularity in distal rectum, left-sided diverticula, ileal diverticulum.  Bx benign. next TCS 02/2012  . Esophagogastroduodenoscopy  04/2008    schatzki ring, s/p ED, hh  . Colonoscopy  03/24/2012    Dr. Gala Romney- inflammatory changes of the rectum and colon consistent with history of U/C. bx= benign colonic mucosa  . Shoulder arthroscopy w/ rotator cuff repair    . Lumbar fusion      Prior to Admission medications   Medication Sig Start Date End Date Taking? Authorizing Provider  aspirin 81 MG tablet Take 81 mg by mouth daily.   Yes Historical Provider, MD  Bee Pollen 550 MG CAPS Take 1  tablet by mouth.   Yes Historical Provider, MD  Calcium Carbonate-Vitamin D (CALCIUM 600/VITAMIN D PO) Take 1 tablet by mouth daily.   Yes Historical Provider, MD  Cholecalciferol (VITAMIN D3) 2000 UNITS capsule Take 2,000 Units by mouth daily.   Yes Historical Provider, MD  citalopram (CELEXA) 20 MG tablet Take 1 tablet (20 mg total) by mouth daily. 04/25/13  Yes Mary-Margaret Hassell Done, FNP  fenofibrate 160 MG tablet Take 160 mg by mouth daily.  06/11/11  Yes Historical Provider, MD  glucosamine-chondroitin 500-400 MG tablet Take 1 tablet by mouth daily.   Yes Historical Provider, MD  haloperidol (HALDOL) 1 MG tablet Take 1.5 tablets (1.5 mg total) by mouth 2 (two) times daily. 11/21/12  Yes Kathrynn Ducking, MD  mesalamine (PENTASA) 500 MG CR capsule Take 1 capsule (500 mg total) by mouth 4 (four) times daily. 09/19/12  Yes Andria Meuse, NP  Methylsulfonylmethane 1000 MG CAPS Take 1 capsule by mouth daily.   Yes Historical Provider, MD  Multiple Vitamin (MULTIVITAMIN WITH MINERALS) TABS Take 1 tablet by mouth daily.   Yes Historical Provider, MD  Omega-3 Fatty Acids 1200 MG CAPS Take 1,200 capsules by mouth daily.   Yes Historical Provider, MD  omeprazole (PRILOSEC) 20 MG capsule Take 1 capsule (20 mg total) by mouth daily. 11/08/12  Yes Mary-Margaret  Hassell Done, FNP  rosuvastatin (CRESTOR) 20 MG tablet Take 20 mg by mouth daily.   Yes Historical Provider, MD  levothyroxine (SYNTHROID, LEVOTHROID) 50 MCG tablet Take 1 tablet (50 mcg total) by mouth daily. 12/28/12   Mary-Margaret Hassell Done, FNP  nystatin (MYCOSTATIN) powder Apply 10,000 mg topically. 03/14/13   Historical Provider, MD  traMADol (ULTRAM) 50 MG tablet Take 50 mg by mouth as needed. 01/05/13   Historical Provider, MD    Allergies as of 05/05/2013  . (No Known Allergies)    Family History  Problem Relation Age of Onset  . Lung cancer Mother 33    deceased  . Colon cancer Neg Hx   . Other Brother     Posttraumatic stress disorder  .  Huntington's disease Daughter     History   Social History  . Marital Status: Married    Spouse Name: N/A    Number of Children: N/A  . Years of Education: N/A   Occupational History  . retired Furniture conservator/restorer   . works as Administrator, [employer] at [employer]    Social History Main Topics  . Smoking status: Never Smoker   . Smokeless tobacco: Not on file  . Alcohol Use: No  . Drug Use: No  . Sexual Activity: Not on file   Other Topics Concern  . Not on file   Social History Narrative  . No narrative on file    Review of Systems: See HPI, otherwise negative ROS  Physical Exam: BP 151/79  Pulse 81  Temp(Src) 97.9 F (36.6 C) (Oral)  Ht 5' 8"  (1.727 m)  Wt 176 lb 9.6 oz (80.105 kg)  BMI 26.86 kg/m2 General:   Alert,  Well-developed, well-nourished, pleasant and cooperative in NAD Skin:  Intact without significant lesions or rashes. Eyes:  Sclera clear, no icterus.   Conjunctiva pink. Ears:  Normal auditory acuity. Nose:  No deformity, discharge,  or lesions. Mouth:  No deformity or lesions. Neck:  Supple; no masses or thyromegaly. No significant cervical adenopathy. Lungs:  Clear throughout to auscultation.   No wheezes, crackles, or rhonchi. No acute distress. Heart:  Regular rate and rhythm; no murmurs, clicks, rubs,  or gallops. Abdomen: Non-distended, normal bowel sounds.  Soft and nontender without appreciable mass or hepatosplenomegaly.  Pulses:  Normal pulses noted. Extremities:  Without clubbing or edema.  Impression: History of of a well-documented the left-sided pan proctocolitis, appeared to the in the near endoscopic and histologic remission last year. Doing very well on 2 grams of Pentasa until recently. Now with nonbloody watery diarrhea.  I doubt this is a flare of UC. I more concerned about C. difficile at this point in time.  Not mentioned above, patient absolutely denies any NSAID use.   Recommendations:   Obtain stool studies including a C. difficile. Obtain  recent labs that done through Dr. Tawanna Sat office. Further recommendations to follow.

## 2013-05-05 NOTE — Patient Instructions (Addendum)
We will retrieve recent blood work from [facility] and/or Dr. Tawanna Sat office  Obtain stool sample for Cdiff, culture, lactoferrin, Giardia/ O and P.  Telephone follow-up

## 2013-05-08 ENCOUNTER — Other Ambulatory Visit: Payer: Medicare Other

## 2013-05-09 LAB — FECAL LACTOFERRIN, QUANT: Lactoferrin: NEGATIVE

## 2013-05-09 LAB — GIARDIA ANTIGEN: Giardia Screen (EIA): NEGATIVE

## 2013-05-12 LAB — STOOL CULTURE

## 2013-05-16 ENCOUNTER — Ambulatory Visit: Payer: Medicare Other

## 2013-05-29 ENCOUNTER — Other Ambulatory Visit: Payer: Self-pay

## 2013-06-09 ENCOUNTER — Telehealth: Payer: Self-pay | Admitting: Nurse Practitioner

## 2013-06-09 NOTE — Telephone Encounter (Signed)
No samples 

## 2013-06-12 ENCOUNTER — Telehealth: Payer: Self-pay | Admitting: Nurse Practitioner

## 2013-06-13 NOTE — Telephone Encounter (Signed)
Samples at front

## 2013-06-19 ENCOUNTER — Ambulatory Visit (INDEPENDENT_AMBULATORY_CARE_PROVIDER_SITE_OTHER): Payer: Medicare Other | Admitting: Family Medicine

## 2013-06-19 ENCOUNTER — Encounter: Payer: Self-pay | Admitting: Family Medicine

## 2013-06-19 VITALS — BP 163/84 | HR 74 | Temp 97.3°F | Ht 68.0 in | Wt 176.0 lb

## 2013-06-19 DIAGNOSIS — K219 Gastro-esophageal reflux disease without esophagitis: Secondary | ICD-10-CM

## 2013-06-19 DIAGNOSIS — F32A Depression, unspecified: Secondary | ICD-10-CM

## 2013-06-19 DIAGNOSIS — F329 Major depressive disorder, single episode, unspecified: Secondary | ICD-10-CM

## 2013-06-19 DIAGNOSIS — Z0000 Encounter for general adult medical examination without abnormal findings: Secondary | ICD-10-CM

## 2013-06-19 LAB — POCT CBC
Granulocyte percent: 59.1 %G (ref 37–80)
HCT, POC: 44.9 % (ref 43.5–53.7)
Hemoglobin: 14.8 g/dL (ref 14.1–18.1)
Lymph, poc: 2.3 (ref 0.6–3.4)
MCH, POC: 29.1 pg (ref 27–31.2)
MCHC: 33 g/dL (ref 31.8–35.4)
MCV: 88.2 fL (ref 80–97)
MPV: 8.2 fL (ref 0–99.8)
POC Granulocyte: 4 (ref 2–6.9)
POC LYMPH PERCENT: 34.4 %L (ref 10–50)
Platelet Count, POC: 256 10*3/uL (ref 142–424)
RBC: 5.1 M/uL (ref 4.69–6.13)
RDW, POC: 13.9 %
WBC: 6.7 10*3/uL (ref 4.6–10.2)

## 2013-06-19 MED ORDER — CITALOPRAM HYDROBROMIDE 20 MG PO TABS: 20.0000 mg | ORAL_TABLET | Freq: Every day | ORAL | Status: DC

## 2013-06-19 MED ORDER — OMEPRAZOLE 20 MG PO CPDR: 20.0000 mg | DELAYED_RELEASE_CAPSULE | Freq: Every day | ORAL | Status: DC

## 2013-06-19 NOTE — Patient Instructions (Signed)

## 2013-06-19 NOTE — Progress Notes (Signed)
  Subjective:    Patient ID: Sergio Baker, male    DOB: [birth year], 78 y.o.   MRN: [id number]  HPI This 78 y.o. male presents for evaluation of follow up and needing med refills.   Review of Systems No chest pain, SOB, HA, dizziness, vision change, N/V, diarrhea, constipation, dysuria, urinary urgency or frequency, myalgias, arthralgias or rash.     Objective:   Physical Exam Vital signs noted  Well developed well nourished male.  HEENT - Head atraumatic Normocephalic                Eyes - PERRLA, Conjuctiva - clear Sclera- Clear EOMI                Ears - EAC's Wnl TM's Wnl Gross Hearing WNL                Nose - Nares patent                 Throat - oropharanx wnl Respiratory - Lungs CTA bilateral Cardiac - RRR S1 and S2 without murmur GI - Abdomen soft Nontender and bowel sounds active x 4 Extremities - No edema. Neuro - Grossly intact.       Assessment & Plan:

## 2013-06-20 ENCOUNTER — Other Ambulatory Visit: Payer: Self-pay | Admitting: Family Medicine

## 2013-06-20 LAB — CMP14+EGFR
ALT: 16 IU/L (ref 0–44)
AST: 26 IU/L (ref 0–40)
Albumin/Globulin Ratio: 1.7 (ref 1.1–2.5)
Albumin: 4.4 g/dL (ref 3.5–4.8)
Alkaline Phosphatase: 78 IU/L (ref 39–117)
BUN/Creatinine Ratio: 14 (ref 10–22)
BUN: 12 mg/dL (ref 8–27)
CO2: 24 mmol/L (ref 18–29)
Calcium: 9.6 mg/dL (ref 8.6–10.2)
Chloride: 99 mmol/L (ref 97–108)
Creatinine, Ser: 0.85 mg/dL (ref 0.76–1.27)
GFR calc Af Amer: 98 mL/min/{1.73_m2} (ref >59)
GFR calc non Af Amer: 85 mL/min/{1.73_m2} (ref >59)
Globulin, Total: 2.6 g/dL (ref 1.5–4.5)
Glucose: 109 mg/dL — ABNORMAL HIGH (ref 65–99)
Potassium: 4.7 mmol/L (ref 3.5–5.2)
Sodium: 142 mmol/L (ref 134–144)
Total Bilirubin: 0.5 mg/dL (ref 0.0–1.2)
Total Protein: 7 g/dL (ref 6.0–8.5)

## 2013-06-20 LAB — LIPID PANEL
Chol/HDL Ratio: 3.9 ratio units (ref 0.0–5.0)
Cholesterol, Total: 151 mg/dL (ref 100–199)
HDL: 39 mg/dL — ABNORMAL LOW (ref >39)
LDL Calculated: 83 mg/dL (ref 0–99)
Triglycerides: 145 mg/dL (ref 0–149)
VLDL Cholesterol Cal: 29 mg/dL (ref 5–40)

## 2013-06-20 LAB — THYROID PANEL WITH TSH
Free Thyroxine Index: 1.5 (ref 1.2–4.9)
T3 Uptake Ratio: 29 % (ref 24–39)
T4, Total: 5.2 ug/dL (ref 4.5–12.0)
TSH: 4.6 u[IU]/mL — ABNORMAL HIGH (ref 0.450–4.500)

## 2013-06-22 NOTE — Progress Notes (Signed)
  Subjective:    Patient ID: Sergio Baker, male    DOB: 01-09-1935, 78 y.o.   MRN: [id number]  HPI    Review of Systems     Objective:   Physical Exam        Assessment & Plan:  Routine general medical examination at a health care facility - Plan: POCT CBC, CMP14+EGFR, Lipid panel, Thyroid Panel With TSH  Depression - Plan: citalopram (CELEXA) 20 MG tablet, POCT CBC, CMP14+EGFR, Lipid panel, Thyroid Panel With TSH  GERD (gastroesophageal reflux disease) - Plan: omeprazole (PRILOSEC) 20 MG capsule, POCT CBC, CMP14+EGFR, Lipid panel, Thyroid Panel With TSH  Lysbeth Penner FNP

## 2013-06-26 ENCOUNTER — Other Ambulatory Visit: Payer: Self-pay | Admitting: Family Medicine

## 2013-06-26 MED ORDER — LEVOTHYROXINE SODIUM 25 MCG PO TABS: 25.0000 ug | ORAL_TABLET | Freq: Every day | ORAL | Status: DC

## 2013-07-28 ENCOUNTER — Telehealth: Payer: Self-pay | Admitting: Nurse Practitioner

## 2013-07-28 ENCOUNTER — Ambulatory Visit (INDEPENDENT_AMBULATORY_CARE_PROVIDER_SITE_OTHER): Payer: Medicare Other | Admitting: General Practice

## 2013-07-28 ENCOUNTER — Encounter: Payer: Self-pay | Admitting: General Practice

## 2013-07-28 VITALS — BP 153/82 | HR 93 | Temp 98.0°F | Ht 68.0 in | Wt 174.0 lb

## 2013-07-28 DIAGNOSIS — N390 Urinary tract infection, site not specified: Secondary | ICD-10-CM

## 2013-07-28 DIAGNOSIS — IMO0001 Reserved for inherently not codable concepts without codable children: Secondary | ICD-10-CM

## 2013-07-28 DIAGNOSIS — R350 Frequency of micturition: Secondary | ICD-10-CM

## 2013-07-28 DIAGNOSIS — Z87440 Personal history of urinary (tract) infections: Secondary | ICD-10-CM

## 2013-07-28 LAB — POCT URINALYSIS DIPSTICK
Bilirubin, UA: NEGATIVE
Glucose, UA: NEGATIVE
Nitrite, UA: POSITIVE
Spec Grav, UA: >=1.03
pH, UA: 6

## 2013-07-28 LAB — POCT UA - MICROSCOPIC ONLY
Casts, Ur, LPF, POC: NEGATIVE
Crystals, Ur, HPF, POC: NEGATIVE

## 2013-07-28 MED ORDER — CIPROFLOXACIN HCL 500 MG PO TABS: 500.0000 mg | ORAL_TABLET | Freq: Two times a day (BID) | ORAL | Status: DC

## 2013-07-28 NOTE — Patient Instructions (Signed)
Urinary Tract Infection  Urinary tract infections (UTIs) can develop anywhere along your urinary tract. Your urinary tract is your body's drainage system for removing wastes and extra water. Your urinary tract includes two kidneys, two ureters, a bladder, and a urethra. Your kidneys are a pair of bean-shaped organs. Each kidney is about the size of your fist. They are located below your ribs, one on each side of your spine.  CAUSES  Infections are caused by microbes, which are microscopic organisms, including fungi, viruses, and bacteria. These organisms are so small that they can only be seen through a microscope. Bacteria are the microbes that most commonly cause UTIs.  SYMPTOMS   Symptoms of UTIs may vary by age and gender of the patient and by the location of the infection. Symptoms in young women typically include a frequent and intense urge to urinate and a painful, burning feeling in the bladder or urethra during urination. Older women and men are more likely to be tired, shaky, and weak and have muscle aches and abdominal pain. A fever may mean the infection is in your kidneys. Other symptoms of a kidney infection include pain in your back or sides below the ribs, nausea, and vomiting.  DIAGNOSIS  To diagnose a UTI, your caregiver will ask you about your symptoms. Your caregiver also will ask to provide a urine sample. The urine sample will be tested for bacteria and white blood cells. White blood cells are made by your body to help fight infection.  TREATMENT   Typically, UTIs can be treated with medication. Because most UTIs are caused by a bacterial infection, they usually can be treated with the use of antibiotics. The choice of antibiotic and length of treatment depend on your symptoms and the type of bacteria causing your infection.  HOME CARE INSTRUCTIONS   If you were prescribed antibiotics, take them exactly as your caregiver instructs you. Finish the medication even if you feel better after you  have only taken some of the medication.   Drink enough water and fluids to keep your urine clear or pale yellow.   Avoid caffeine, tea, and carbonated beverages. They tend to irritate your bladder.   Empty your bladder often. Avoid holding urine for long periods of time.   Empty your bladder before and after sexual intercourse.   After a bowel movement, women should cleanse from front to back. Use each tissue only once.  SEEK MEDICAL CARE IF:    You have back pain.   You develop a fever.   Your symptoms do not begin to resolve within 3 days.  SEEK IMMEDIATE MEDICAL CARE IF:    You have severe back pain or lower abdominal pain.   You develop chills.   You have nausea or vomiting.   You have continued burning or discomfort with urination.  MAKE SURE YOU:    Understand these instructions.   Will watch your condition.   Will get help right away if you are not doing well or get worse.  Document Released: 05/13/2005 Document Revised: 02/02/2012 Document Reviewed: 09/11/2011  [employer] Patient Information 2014 [employer]

## 2013-07-28 NOTE — Progress Notes (Signed)
Subjective:    Patient ID: Sergio Baker, male    DOB: 18-May-1935, 78 y.o.   MRN: [id number]  Fever  This is a new problem. The current episode started yesterday. The problem occurs daily. The problem has been gradually worsening. The maximum temperature noted was 101 to 101.9 F. The temperature was taken using an oral thermometer. Associated symptoms include muscle aches and nausea. Pertinent negatives include no abdominal pain, chest pain, congestion, coughing, headaches, sore throat or vomiting. He has tried nothing for the symptoms.  Urinary Tract Infection  This is a new problem. The current episode started in the past 7 days. The problem occurs every urination. The problem has been gradually worsening. The quality of the pain is described as aching and burning. The pain is at a severity of 5/10. The pain is mild. The maximum temperature recorded prior to his arrival was 101 - 101.9 F. He is not sexually active. There is no history of pyelonephritis. Associated symptoms include frequency, nausea and urgency. Pertinent negatives include no chills, hematuria or vomiting. He has tried nothing for the symptoms. There is no history of catheterization or recurrent UTIs.      Review of Systems  Constitutional: Positive for fever. Negative for chills.  HENT: Negative for congestion and sore throat.   Respiratory: Negative for cough and chest tightness.   Cardiovascular: Negative for chest pain and palpitations.  Gastrointestinal: Positive for nausea. Negative for vomiting and abdominal pain.  Genitourinary: Positive for dysuria, urgency and frequency. Negative for hematuria and difficulty urinating.  Neurological: Negative for dizziness, weakness and headaches.       Objective:   Physical Exam  Constitutional: He is oriented to person, place, and time. He appears well-developed and well-nourished.  Cardiovascular: Normal rate, regular rhythm and normal heart sounds.   Pulmonary/Chest:  Effort normal and breath sounds normal. No respiratory distress. He exhibits no tenderness.  Abdominal: Soft. Bowel sounds are normal. He exhibits no distension. There is no tenderness.  Neurological: He is alert and oriented to person, place, and time.  Skin: Skin is warm and dry.  Psychiatric: He has a normal mood and affect.     Results for orders placed in visit on 07/28/13  POCT URINALYSIS DIPSTICK      Result Value Range   Color, UA straw     Clarity, UA cloudy     Glucose, UA neg     Bilirubin, UA neg     Ketones, UA mod     Spec Grav, UA >=1.030     Blood, UA mod     pH, UA 6.0     Protein, UA 200     Urobilinogen, UA negative     Nitrite, UA pos     Leukocytes, UA moderate (2+)    POCT UA - MICROSCOPIC ONLY      Result Value Range   WBC, Ur, HPF, POC 10-15     RBC, urine, microscopic 10-15     Bacteria, U Microscopic mod     Mucus, UA mod     Epithelial cells, urine per micros occ     Crystals, Ur, HPF, POC neg     Casts, Ur, LPF, POC neg     Yeast, UA neg          Assessment & Plan:  1. Frequency - POCT urinalysis dipstick - POCT UA - Microscopic Only  2. UTI (urinary tract infection) - ciprofloxacin (CIPRO) 500 MG tablet; Take 1 tablet (500  mg total) by mouth 2 (two) times daily.  Dispense: 20 tablet; Refill: 0 -Increase fluid intake AZO over the counter X2 days Frequent voiding Proper perineal hygiene Culture pending RTO prn and in 2 weeks for recheck Patient verbalized understanding Erby Pian, FNP-C

## 2013-07-28 NOTE — Telephone Encounter (Signed)
appt for today at 9:40

## 2013-08-03 ENCOUNTER — Other Ambulatory Visit: Payer: Self-pay | Admitting: General Practice

## 2013-08-21 ENCOUNTER — Ambulatory Visit: Payer: Medicare Other | Admitting: Nurse Practitioner

## 2013-09-01 ENCOUNTER — Encounter: Payer: Self-pay | Admitting: Nurse Practitioner

## 2013-09-01 ENCOUNTER — Ambulatory Visit (INDEPENDENT_AMBULATORY_CARE_PROVIDER_SITE_OTHER): Payer: Medicare Other | Admitting: Nurse Practitioner

## 2013-09-01 VITALS — BP 156/78 | HR 95 | Temp 98.2°F | Ht 68.0 in | Wt 176.0 lb

## 2013-09-01 DIAGNOSIS — K219 Gastro-esophageal reflux disease without esophagitis: Secondary | ICD-10-CM

## 2013-09-01 DIAGNOSIS — R7989 Other specified abnormal findings of blood chemistry: Secondary | ICD-10-CM

## 2013-09-01 DIAGNOSIS — R946 Abnormal results of thyroid function studies: Secondary | ICD-10-CM

## 2013-09-01 DIAGNOSIS — R7309 Other abnormal glucose: Secondary | ICD-10-CM

## 2013-09-01 DIAGNOSIS — R739 Hyperglycemia, unspecified: Secondary | ICD-10-CM

## 2013-09-01 DIAGNOSIS — G10 Huntington's disease: Secondary | ICD-10-CM

## 2013-09-01 NOTE — Progress Notes (Signed)
 Subjective:    Patient ID: Sergio Baker, male    DOB: 03/27/1935, 79 y.o.   MRN: [id number]  HPI Patient in today for follow-up. Last visit TSH was elevated and Dr. Oxford put him on thyroid medicine - when he read side effect profile he would not take medds because side effects to his symptoms of Huntington's. He wanted to wait and see what TSH was today before starting. Hs TSH has ranged from 5.2-1.2-4.6 not on anything. He feels fine- no complaints today. Patient Active Problem List   Diagnosis Date Noted  . Abnormality of gait 05/23/2012  . Huntington's chorea 05/23/2012  . GASTROESOPHAGEAL REFLUX DISEASE, CHRONIC 03/08/2009  . ULCERATIVE PROCTITIS 03/08/2009  . DIVERTICULOSIS OF COLON 03/08/2009  . OTHER DYSPHAGIA 03/08/2009  . DIARRHEA 03/08/2009  . ULCERATIVE COLITIS, HX OF 03/08/2009  . ATRIAL FIBRILLATION 02/04/2009   Outpatient Encounter Prescriptions as of 09/01/2013  Medication Sig  . aspirin 81 MG tablet Take 81 mg by mouth daily.  . Bee Pollen 550 MG CAPS Take 1 tablet by mouth.  . Calcium Carbonate-Vitamin D (CALCIUM 600/VITAMIN D PO) Take 1 tablet by mouth daily.  . Cholecalciferol (VITAMIN D3) 2000 UNITS capsule Take 2,000 Units by mouth daily.  . citalopram (CELEXA) 20 MG tablet Take 1 tablet (20 mg total) by mouth daily.  . fenofibrate 160 MG tablet Take 160 mg by mouth daily.   . glucosamine-chondroitin 500-400 MG tablet Take 1 tablet by mouth daily.  . haloperidol (HALDOL) 1 MG tablet Take 1.5 tablets (1.5 mg total) by mouth 2 (two) times daily.  . levothyroxine (LEVOTHROID) 25 MCG tablet Take 1 tablet (25 mcg total) by mouth daily before breakfast.  . mesalamine (PENTASA) 500 MG CR capsule Take 1 capsule (500 mg total) by mouth 4 (four) times daily.  . Multiple Vitamin (MULTIVITAMIN WITH MINERALS) TABS Take 1 tablet by mouth daily.  . Omega-3 Fatty Acids 1200 MG CAPS Take 1,200 capsules by mouth daily.  . omeprazole (PRILOSEC) 20 MG capsule Take 1 capsule (20  mg total) by mouth daily.  . rosuvastatin (CRESTOR) 20 MG tablet Take 20 mg by mouth daily.  . [DISCONTINUED] ciprofloxacin (CIPRO) 500 MG tablet Take 1 tablet (500 mg total) by mouth 2 (two) times daily.       Review of Systems  Respiratory: Negative.   Cardiovascular: Negative.   Gastrointestinal: Negative.   Genitourinary: Negative.   Musculoskeletal: Negative.   Neurological: Positive for tremors and weakness.  All other systems reviewed and are negative.       Objective:   Physical Exam  Constitutional: He is oriented to person, place, and time. He appears well-developed and well-nourished.  HENT:  Head: Normocephalic.  Right Ear: External ear normal.  Left Ear: External ear normal.  Nose: Nose normal.  Mouth/Throat: Oropharynx is clear and moist.  Eyes: EOM are normal. Pupils are equal, round, and reactive to light.  Neck: Normal range of motion. Neck supple. No JVD present. No thyromegaly present.  Cardiovascular: Normal rate, regular rhythm, normal heart sounds and intact distal pulses.  Exam reveals no gallop and no friction rub.   No murmur heard. Pulmonary/Chest: Effort normal and breath sounds normal. No respiratory distress. He has no wheezes. He has no rales. He exhibits no tenderness.  Abdominal: Soft. Bowel sounds are normal. He exhibits no mass. There is no tenderness.  Genitourinary: Prostate normal and penis normal.  Musculoskeletal: Normal range of motion. He exhibits no edema.  Constant muscle movement of extremities    Lymphadenopathy:    He has no cervical adenopathy.  Neurological: He is alert and oriented to person, place, and time. No cranial nerve deficit.  Skin: Skin is warm and dry.  Psychiatric: He has a normal mood and affect. His behavior is normal. Judgment and thought content normal.   BP 156/78  Pulse 95  Temp(Src) 98.2 F (36.8 C) (Oral)  Ht 5' 8" (1.727 m)  Wt 176 lb (79.833 kg)  BMI 26.77 kg/m2        Assessment & Plan:    1. Huntington's chorea   2. GASTROESOPHAGEAL REFLUX DISEASE, CHRONIC   3. Elevated TSH    Orders Placed This Encounter  Procedures  . CMP14+EGFR  . NMR, lipoprofile  . Thyroid Panel With TSH    Labs pending Health maintenance reviewed Diet and exercise encouraged Continue all meds Follow up  In 3 months   Mary[place]Margaret , FNP    

## 2013-09-01 NOTE — Patient Instructions (Signed)

## 2013-09-03 LAB — THYROID PANEL WITH TSH
Free Thyroxine Index: 1.3 (ref 1.2–4.9)
T3 UPTAKE RATIO: 28 % (ref 24–39)
T4 TOTAL: 4.6 ug/dL (ref 4.5–12.0)
TSH: 6.19 u[IU]/mL — ABNORMAL HIGH (ref 0.450–4.500)

## 2013-09-03 LAB — CMP14+EGFR
ALT: 14 IU/L (ref 0–44)
AST: 23 IU/L (ref 0–40)
Albumin/Globulin Ratio: 1.6 (ref 1.1–2.5)
Albumin: 4.4 g/dL (ref 3.5–4.8)
Alkaline Phosphatase: 71 IU/L (ref 39–117)
BUN/Creatinine Ratio: 12 (ref 10–22)
BUN: 11 mg/dL (ref 8–27)
CALCIUM: 9.5 mg/dL (ref 8.6–10.2)
CHLORIDE: 100 mmol/L (ref 97–108)
CO2: 24 mmol/L (ref 18–29)
Creatinine, Ser: 0.89 mg/dL (ref 0.76–1.27)
GFR calc Af Amer: 96 mL/min/{1.73_m2} (ref >59)
GFR calc non Af Amer: 83 mL/min/{1.73_m2} (ref >59)
GLUCOSE: 144 mg/dL — AB (ref 65–99)
Globulin, Total: 2.7 g/dL (ref 1.5–4.5)
POTASSIUM: 4.1 mmol/L (ref 3.5–5.2)
Sodium: 140 mmol/L (ref 134–144)
Total Bilirubin: 0.5 mg/dL (ref 0.0–1.2)
Total Protein: 7.1 g/dL (ref 6.0–8.5)

## 2013-09-03 LAB — NMR, LIPOPROFILE
Cholesterol: 139 mg/dL (ref <200)
HDL CHOLESTEROL BY NMR: 40 mg/dL (ref >=40)
HDL Particle Number: 30.4 umol/L — ABNORMAL LOW (ref >=30.5)
LDL PARTICLE NUMBER: 996 nmol/L (ref <1000)
LDL Size: 20.1 nm — ABNORMAL LOW (ref >20.5)
LDLC SERPL CALC-MCNC: 59 mg/dL (ref <100)
LP-IR Score: 55 — ABNORMAL HIGH (ref <=45)
Small LDL Particle Number: 670 nmol/L — ABNORMAL HIGH (ref <=527)
Triglycerides by NMR: 200 mg/dL — ABNORMAL HIGH (ref <150)

## 2013-09-04 ENCOUNTER — Telehealth: Payer: Self-pay | Admitting: Family Medicine

## 2013-09-04 NOTE — Addendum Note (Signed)
Addended by: Chevis Pretty on: 09/04/2013 07:04 AM   Modules accepted: Orders

## 2013-09-04 NOTE — Telephone Encounter (Signed)
Message copied by Waverly Ferrari on Mon Sep 04, 2013  3:44 PM ------      Message from: Chevis Pretty      Created: Mon Sep 04, 2013 11:28 AM       Should not make Huntings worse- really needs to take ------

## 2013-09-05 LAB — POCT GLYCOSYLATED HEMOGLOBIN (HGB A1C): HEMOGLOBIN A1C: 6.1

## 2013-10-19 ENCOUNTER — Ambulatory Visit: Payer: Medicare Other | Admitting: Neurology

## 2014-01-09 ENCOUNTER — Other Ambulatory Visit: Payer: Self-pay | Admitting: Nurse Practitioner

## 2014-01-17 ENCOUNTER — Ambulatory Visit (INDEPENDENT_AMBULATORY_CARE_PROVIDER_SITE_OTHER): Payer: Medicare Other | Admitting: Neurology

## 2014-01-17 ENCOUNTER — Telehealth: Payer: Self-pay

## 2014-01-17 ENCOUNTER — Encounter: Payer: Self-pay | Admitting: Neurology

## 2014-01-17 VITALS — BP 130/82 | HR 81 | Temp 98.5°F | Ht 66.0 in | Wt 176.0 lb

## 2014-01-17 DIAGNOSIS — G10 Huntington's disease: Secondary | ICD-10-CM

## 2014-01-17 DIAGNOSIS — R413 Other amnesia: Secondary | ICD-10-CM

## 2014-01-17 DIAGNOSIS — R269 Unspecified abnormalities of gait and mobility: Secondary | ICD-10-CM

## 2014-01-17 HISTORY — DX: Other amnesia: R41.3

## 2014-01-17 NOTE — Patient Instructions (Signed)
Huntington's Disease Huntington's disease (HD) results from genetically programmed degeneration of brain cells (neurons) in certain areas of the brain. This degeneration causes uncontrolled movements, loss of intellectual faculties, and emotional disturbance.  CAUSES  HD is a familial disease. It is passed from parent to child through a mutation in the normal gene. Each child of an HD parent has a 50-50 chance of inheriting the HD gene. If a child does not inherit the HD gene, he or she will not develop the disease. Also, he/she cannot pass it to future generations. But a person who inherits the HD gene will sooner or later develop the disease. Whether one child inherits the gene has no bearing on whether others will inherit it. SYMPTOMS  Some early problems (symptoms) include:  Mood swings.  Depression.  Irritability.  Trouble with the following:  Driving.  Learning new things.  Remembering a fact.  Making decisions. As the disease progresses, concentration on intellectual tasks becomes difficult. The patient may have difficulty feeding himself or herself and swallowing. The rate of disease progression and the age of onset vary from person to person.  DIAGNOSIS  Caregivers may diagnose HD by using:  A genetic test.  A complete medical history.  Neurological and laboratory tests. Presymptomatic testing is available for those who are at risk for carrying the HD gene. In 1 to 3 percent of individuals with HD, no family history of HD can be found. TREATMENT  Caregivers prescribe a number of medications. These help control emotional and movement problems associated with HD. Most drugs used to treat the symptoms of HD have side effects. They include:   Fatigue.  Restlessness.  Hyperexcitability. It is important for people with HD to maintain physical fitness as much as possible. Individuals who exercise and keep active tend to do better than those who do not. At this time, there  is no way to stop or reverse the course of HD. Now that the HD gene has been located, investigators continue to study it. They want to understand how it cause disease in the human body. Document Released: 07/24/2002 Document Revised: 10/26/2011 Document Reviewed: 08/03/2005 [facility] Patient Information 2014 [place].

## 2014-01-17 NOTE — Progress Notes (Signed)
Reason for visit: Huntington's disease  Sergio Baker is an 79 y.o. male  History of present illness:  Sergio Baker is a 79 year old right-handed white male with a history of Huntington's disease. The patient has a daughter with a similar problem, and he is less affected than his daughter. He is on low-dose Haldol taking 1.5 mg twice daily. He has done relatively well with minimal progression of the last one year. He does have some mild memory issues, but he continues to operate a motor vehicle without difficulty. He has not given up any activities of daily living secondary to memory. He denies any falls, but his gait is somewhat unstable. He continues to have some excessive daytime drowsiness, and he has problems with bilateral rotator cuff injuries. The patient returns to this office for an evaluation. He denies problems with choking or swallowing.  Past Medical History  Diagnosis Date  . Proctocolitis, ulcerative     Diagnosed in the 1980s.  Marland Kitchen GERD (gastroesophageal reflux disease)   . A-fib   . Hemorrhoids   . Diverticula of colon   . Hiatal hernia     small  . Schatzki's ring   . Ulcerative colitis     left side  . Huntington's disease 2010  . Depression   . Arthritis   . Enlarged prostate   . Joint pain   . Hearing loss   . Abnormality of gait   . Hyperlipidemia   . Memory deficits 01/17/2014  . HOH (hard of hearing)     Past Surgical History  Procedure Laterality Date  . Appendectomy    . Tonsillectomy    . Cyst removed from urinary bladder    . Colonoscopy  02/2010    patchy erythema, minimal granularity in distal rectum, left-sided diverticula, ileal diverticulum.  Bx benign. next TCS 02/2012  . Esophagogastroduodenoscopy  04/2008    schatzki ring, s/p ED, hh  . Colonoscopy  03/24/2012    Dr. Gala Romney- inflammatory changes of the rectum and colon consistent with history of U/C. bx= benign colonic mucosa  . Shoulder arthroscopy w/ rotator cuff repair    . Lumbar  fusion      Family History  Problem Relation Age of Onset  . Lung cancer Mother 91    deceased  . Colon cancer Neg Hx   . Other Brother     Posttraumatic stress disorder  . Huntington's disease Daughter     Social history:  reports that he has never smoked. He has never used smokeless tobacco. He reports that he does not drink alcohol or use illicit drugs.   No Known Allergies  Medications:  Current Outpatient Prescriptions on File Prior to Visit  Medication Sig Dispense Refill  . aspirin 81 MG tablet Take 81 mg by mouth daily.      Raelyn Ensign Pollen 550 MG CAPS Take 1 tablet by mouth.      . Calcium Carbonate-Vitamin D (CALCIUM 600/VITAMIN D PO) Take 1 tablet by mouth daily.      . Cholecalciferol (VITAMIN D3) 2000 UNITS capsule Take 2,000 Units by mouth daily.      . citalopram (CELEXA) 20 MG tablet Take 1 tablet (20 mg total) by mouth daily.  90 tablet  3  . fenofibrate 160 MG tablet Take 160 mg by mouth daily.       Marland Kitchen glucosamine-chondroitin 500-400 MG tablet Take 1 tablet by mouth daily.      Marland Kitchen levothyroxine (SYNTHROID, LEVOTHROID) 50 MCG tablet  TAKE ONE (1) TABLET EACH DAY  90 tablet  1  . mesalamine (PENTASA) 500 MG CR capsule Take 1 capsule (500 mg total) by mouth 4 (four) times daily.  120 capsule  0  . Multiple Vitamin (MULTIVITAMIN WITH MINERALS) TABS Take 1 tablet by mouth daily.      . Omega-3 Fatty Acids 1200 MG CAPS Take 1,200 capsules by mouth daily.      Marland Kitchen omeprazole (PRILOSEC) 20 MG capsule Take 1 capsule (20 mg total) by mouth daily.  90 capsule  3  . rosuvastatin (CRESTOR) 20 MG tablet Take 20 mg by mouth daily.       No current facility-administered medications on file prior to visit.    ROS:  Out of a complete 14 system review of symptoms, the patient complains only of the following symptoms, and all other reviewed systems are negative.  Hearing loss Memory problems Mild gait disturbance  Blood pressure 130/82, pulse 81, temperature 98.5 F (36.9 C),  temperature source Oral, height 5' 6"  (1.676 m), weight 176 lb (79.833 kg).  Physical Exam  General: The patient is alert and cooperative at the time of the examination.  Skin: No significant peripheral edema is noted.   Neurologic Exam  Mental status: The patient is oriented x 3. Mini-Mental status examination done today shows a total score of 28/30.  Cranial nerves: Facial symmetry is present. Speech is normal, no aphasia or dysarthria is noted. Extraocular movements are full. Visual fields are full.  Motor: The patient has good strength in all 4 extremities.  Sensory examination: Soft touch sensation is symmetric on the face, arms, or legs.  Coordination: The patient has good finger-nose-finger and heel-to-shin bilaterally. The patient does have some fidgety-type movements of the legs while sitting.  Gait and station: The patient has a wide-based gait, some difficulty with turns. Tandem gait is slightly unsteady. Romberg is negative. No drift is seen.  Reflexes: Deep tendon reflexes are symmetric.   Assessment/Plan:  One. Huntington's disease  2. Memory disorder  3. Mild gait disorder  The patient will continue the Haldol at 1.5 mg twice daily. He will followup in about 6-8 months. He appears to be relatively stable with his Huntington's disease.  Jill Alexanders MD 01/17/2014 7:02 PM  [facility] [address] [place], [place] [address]  Phone [phone] Fax [phone]

## 2014-01-17 NOTE — Telephone Encounter (Signed)
Spoke to patient. Offered earlier appt today (on waitlist). Patient agreed.

## 2014-01-18 ENCOUNTER — Ambulatory Visit (INDEPENDENT_AMBULATORY_CARE_PROVIDER_SITE_OTHER): Payer: Medicare Other | Admitting: Nurse Practitioner

## 2014-01-18 ENCOUNTER — Encounter: Payer: Self-pay | Admitting: Nurse Practitioner

## 2014-01-18 VITALS — BP 158/86 | HR 73 | Temp 98.3°F | Ht 65.5 in | Wt 176.0 lb

## 2014-01-18 DIAGNOSIS — G10 Huntington's disease: Secondary | ICD-10-CM

## 2014-01-18 DIAGNOSIS — F329 Major depressive disorder, single episode, unspecified: Secondary | ICD-10-CM

## 2014-01-18 DIAGNOSIS — F3289 Other specified depressive episodes: Secondary | ICD-10-CM

## 2014-01-18 DIAGNOSIS — F32A Depression, unspecified: Secondary | ICD-10-CM

## 2014-01-18 DIAGNOSIS — Z125 Encounter for screening for malignant neoplasm of prostate: Secondary | ICD-10-CM

## 2014-01-18 DIAGNOSIS — E039 Hypothyroidism, unspecified: Secondary | ICD-10-CM

## 2014-01-18 DIAGNOSIS — K219 Gastro-esophageal reflux disease without esophagitis: Secondary | ICD-10-CM

## 2014-01-18 DIAGNOSIS — R269 Unspecified abnormalities of gait and mobility: Secondary | ICD-10-CM

## 2014-01-18 DIAGNOSIS — E785 Hyperlipidemia, unspecified: Secondary | ICD-10-CM

## 2014-01-18 NOTE — Progress Notes (Signed)
   Subjective:    Patient ID: Sergio Baker, male    DOB: 08/28/1934, 79 y.o.   MRN: [id number]  HPI Patient in today for follow up of Chronic medical problems:  Huntington's Saw neurologist yesterday- No much change since last visit- They have encouraged him to walk with a cane so that he does not fall Hypothyroidism  Currnetly on levothyroxin 50 mcg- doing well- no c/o fatigue Depression Currently on celexa which he says is working well without side effects. Hyperlipidemia  Patient has had for >10 years. He is currently taking crestor without any problems. He is trying to watch diet. GERD Omeprazole daily keeps symptoms under control  Review of Systems  Constitutional: Negative.   HENT: Negative.   Respiratory: Negative.   Cardiovascular: Negative.   Gastrointestinal: Negative.   Genitourinary: Negative.   Neurological: Negative.   Psychiatric/Behavioral: Negative.   All other systems reviewed and are negative.      Objective:   Physical Exam  Constitutional: He is oriented to person, place, and time. He appears well-developed and well-nourished.  HENT:  Head: Normocephalic.  Right Ear: External ear normal.  Left Ear: External ear normal.  Nose: Nose normal.  Mouth/Throat: Oropharynx is clear and moist.  Eyes: EOM are normal. Pupils are equal, round, and reactive to light.  Neck: Normal range of motion. Neck supple. No JVD present. No thyromegaly present.  Cardiovascular: Normal rate, regular rhythm, normal heart sounds and intact distal pulses.  Exam reveals no gallop and no friction rub.   No murmur heard. Pulmonary/Chest: Effort normal and breath sounds normal. No respiratory distress. He has no wheezes. He has no rales. He exhibits no tenderness.  Abdominal: Soft. Bowel sounds are normal. He exhibits no mass. There is no tenderness.  Genitourinary: Prostate normal and penis normal.  Musculoskeletal: Normal range of motion. He exhibits no edema.  Constant jerking  movements. Gait slightly unsteady.  Lymphadenopathy:    He has no cervical adenopathy.  Neurological: He is alert and oriented to person, place, and time. No cranial nerve deficit.  Skin: Skin is warm and dry.  Psychiatric: He has a normal mood and affect. His behavior is normal. Judgment and thought content normal.   BP 158/86  Pulse 73  Temp(Src) 98.3 F (36.8 C) (Oral)  Ht 5' 5.5" (1.664 m)  Wt 176 lb (79.833 kg)  BMI 28.83 kg/m2        Assessment & Plan:   1. Hypothyroidism   2. Hyperlipidemia LDL goal < 100   3. GERD (gastroesophageal reflux disease)   4. Depression   5. Huntington's chorea   6. GASTROESOPHAGEAL REFLUX DISEASE, CHRONIC   7. Abnormality of gait   8. Prostate cancer screening    Orders Placed This Encounter  Procedures  . CMP14+EGFR  . NMR, lipoprofile  . PSA, total and free     Labs pending Health maintenance reviewed Diet and exercise encouraged Continue all meds Follow up  In 3 months   [place], FNP

## 2014-01-18 NOTE — Patient Instructions (Signed)

## 2014-01-19 LAB — CMP14+EGFR
A/G RATIO: 2 (ref 1.1–2.5)
ALBUMIN: 4.6 g/dL (ref 3.5–4.8)
ALT: 18 IU/L (ref 0–44)
AST: 24 IU/L (ref 0–40)
Alkaline Phosphatase: 60 IU/L (ref 39–117)
BILIRUBIN TOTAL: 0.5 mg/dL (ref 0.0–1.2)
BUN/Creatinine Ratio: 15 (ref 10–22)
BUN: 13 mg/dL (ref 8–27)
CO2: 26 mmol/L (ref 18–29)
Calcium: 9.8 mg/dL (ref 8.6–10.2)
Chloride: 101 mmol/L (ref 97–108)
Creatinine, Ser: 0.85 mg/dL (ref 0.76–1.27)
GFR calc Af Amer: 98 mL/min/{1.73_m2} (ref >59)
GFR calc non Af Amer: 85 mL/min/{1.73_m2} (ref >59)
GLUCOSE: 104 mg/dL — AB (ref 65–99)
Globulin, Total: 2.3 g/dL (ref 1.5–4.5)
POTASSIUM: 5.4 mmol/L — AB (ref 3.5–5.2)
Sodium: 141 mmol/L (ref 134–144)
TOTAL PROTEIN: 6.9 g/dL (ref 6.0–8.5)

## 2014-01-19 LAB — PSA, TOTAL AND FREE
PSA, Free Pct: 22.7 %
PSA, Free: 1.09 ng/mL
PSA: 4.8 ng/mL — ABNORMAL HIGH (ref 0.0–4.0)

## 2014-01-19 LAB — NMR, LIPOPROFILE
CHOLESTEROL: 180 mg/dL (ref 100–199)
HDL Cholesterol by NMR: 41 mg/dL (ref >39)
HDL Particle Number: 34.8 umol/L (ref >=30.5)
LDL Particle Number: 1232 nmol/L — ABNORMAL HIGH (ref <1000)
LDL SIZE: 19.9 nm (ref >20.5)
LDLC SERPL CALC-MCNC: 109 mg/dL — ABNORMAL HIGH (ref 0–99)
LP-IR Score: 64 — ABNORMAL HIGH (ref <=45)
Small LDL Particle Number: 873 nmol/L — ABNORMAL HIGH (ref <=527)
TRIGLYCERIDES BY NMR: 151 mg/dL — AB (ref 0–149)

## 2014-01-22 ENCOUNTER — Other Ambulatory Visit (INDEPENDENT_AMBULATORY_CARE_PROVIDER_SITE_OTHER): Payer: Medicare Other

## 2014-01-22 DIAGNOSIS — E039 Hypothyroidism, unspecified: Secondary | ICD-10-CM

## 2014-01-22 DIAGNOSIS — E875 Hyperkalemia: Secondary | ICD-10-CM

## 2014-01-22 NOTE — Progress Notes (Signed)
Pt came in for lab only

## 2014-01-23 LAB — BMP8+EGFR
BUN / CREAT RATIO: 23 — AB (ref 10–22)
BUN: 20 mg/dL (ref 8–27)
CO2: 20 mmol/L (ref 18–29)
CREATININE: 0.87 mg/dL (ref 0.76–1.27)
Calcium: 9.2 mg/dL (ref 8.6–10.2)
Chloride: 102 mmol/L (ref 97–108)
GFR calc Af Amer: 97 mL/min/{1.73_m2} (ref >59)
GFR, EST NON AFRICAN AMERICAN: 84 mL/min/{1.73_m2} (ref >59)
Glucose: 148 mg/dL — ABNORMAL HIGH (ref 65–99)
Potassium: 4.3 mmol/L (ref 3.5–5.2)
SODIUM: 143 mmol/L (ref 134–144)

## 2014-01-23 LAB — THYROID PANEL WITH TSH
FREE THYROXINE INDEX: 1.8 (ref 1.2–4.9)
T3 UPTAKE RATIO: 29 % (ref 24–39)
T4, Total: 6.1 ug/dL (ref 4.5–12.0)
TSH: 1.75 u[IU]/mL (ref 0.450–4.500)

## 2014-03-26 ENCOUNTER — Other Ambulatory Visit: Payer: Self-pay | Admitting: Nurse Practitioner

## 2014-04-11 ENCOUNTER — Ambulatory Visit: Payer: Medicare Other | Admitting: Neurology

## 2014-04-26 ENCOUNTER — Ambulatory Visit (INDEPENDENT_AMBULATORY_CARE_PROVIDER_SITE_OTHER): Payer: Medicare Other | Admitting: Nurse Practitioner

## 2014-04-26 ENCOUNTER — Encounter: Payer: Self-pay | Admitting: Nurse Practitioner

## 2014-04-26 VITALS — BP 124/76 | HR 74 | Temp 97.9°F | Ht 65.5 in | Wt 168.2 lb

## 2014-04-26 DIAGNOSIS — E0789 Other specified disorders of thyroid: Secondary | ICD-10-CM

## 2014-04-26 DIAGNOSIS — G10 Huntington's disease: Secondary | ICD-10-CM

## 2014-04-26 DIAGNOSIS — E034 Atrophy of thyroid (acquired): Secondary | ICD-10-CM

## 2014-04-26 DIAGNOSIS — E785 Hyperlipidemia, unspecified: Secondary | ICD-10-CM

## 2014-04-26 DIAGNOSIS — F3289 Other specified depressive episodes: Secondary | ICD-10-CM

## 2014-04-26 DIAGNOSIS — I482 Chronic atrial fibrillation, unspecified: Secondary | ICD-10-CM

## 2014-04-26 DIAGNOSIS — E038 Other specified hypothyroidism: Secondary | ICD-10-CM

## 2014-04-26 DIAGNOSIS — F32A Depression, unspecified: Secondary | ICD-10-CM

## 2014-04-26 DIAGNOSIS — K219 Gastro-esophageal reflux disease without esophagitis: Secondary | ICD-10-CM

## 2014-04-26 DIAGNOSIS — F329 Major depressive disorder, single episode, unspecified: Secondary | ICD-10-CM

## 2014-04-26 DIAGNOSIS — I4891 Unspecified atrial fibrillation: Secondary | ICD-10-CM

## 2014-04-26 NOTE — Addendum Note (Signed)
Addended by: Pollyann Kennedy F on: 04/26/2014 12:24 PM   Modules accepted: Orders

## 2014-04-26 NOTE — Progress Notes (Signed)
   Subjective:    Patient ID: Sergio Baker, male    DOB: March 28, 1935, 79 y.o.   MRN: [id number]  HPI  Patient in today for follow up of Chronic medical problems:  Huntington's Saw neurologist yesterday- No much change since last visit- They have encouraged him to walk with a cane so that he does not fall. Gait is getting more unsteady. Hypothyroidism  Currnetly on levothyroxin 50 mcg- doing well- no c/o fatigue Depression Currently on celexa which he says is working well without side effects. Hyperlipidemia  Patient has had for >10 years. He is currently taking crestor without any problems. He is trying to watch diet. GERD Omeprazole daily keeps symptoms under control  Review of Systems  Constitutional: Negative.   HENT: Negative.   Respiratory: Negative.   Cardiovascular: Negative.   Gastrointestinal: Negative.   Genitourinary: Negative.   Neurological: Negative.   Psychiatric/Behavioral: Negative.   All other systems reviewed and are negative.      Objective:   Physical Exam  Constitutional: He is oriented to person, place, and time. He appears well-developed and well-nourished.  HENT:  Head: Normocephalic.  Right Ear: External ear normal.  Left Ear: External ear normal.  Nose: Nose normal.  Mouth/Throat: Oropharynx is clear and moist.  Eyes: EOM are normal. Pupils are equal, round, and reactive to light.  Neck: Normal range of motion. Neck supple. No JVD present. No thyromegaly present.  Cardiovascular: Normal rate, regular rhythm, normal heart sounds and intact distal pulses.  Exam reveals no gallop and no friction rub.   No murmur heard. Pulmonary/Chest: Effort normal and breath sounds normal. No respiratory distress. He has no wheezes. He has no rales. He exhibits no tenderness.  Abdominal: Soft. Bowel sounds are normal. He exhibits no mass. There is no tenderness.  Genitourinary: Prostate normal and penis normal.  Musculoskeletal: Normal range of motion. He  exhibits no edema.  Constant jerking movements. Gait slightly unsteady.  Lymphadenopathy:    He has no cervical adenopathy.  Neurological: He is alert and oriented to person, place, and time. No cranial nerve deficit.  Skin: Skin is warm and dry.  Psychiatric: He has a normal mood and affect. His behavior is normal. Judgment and thought content normal.   BP 124/76  Pulse 74  Temp(Src) 97.9 F (36.6 C) (Oral)  Ht 5' 5.5" (1.664 m)  Wt 168 lb 3.2 oz (76.295 kg)  BMI 27.55 kg/m2        Assessment & Plan:     1. Hypothyroidism due to acquired atrophy of thyroid   2. Hyperlipidemia with target LDL less than 100   3. Huntington's chorea   4. Gastroesophageal reflux disease without esophagitis   5. Chronic atrial fibrillation   6. Depression    Orders Placed This Encounter  Procedures  . CMP14+EGFR  . NMR, lipoprofile  . Thyroid Panel With TSH    Refuses immunizations Labs pending Health maintenance reviewed Diet and exercise encouraged Continue all meds Follow up  In 3 months   [place], FNP

## 2014-04-26 NOTE — Patient Instructions (Signed)

## 2014-04-27 LAB — CMP14+EGFR
ALBUMIN: 4.6 g/dL (ref 3.5–4.8)
ALT: 16 IU/L (ref 0–44)
AST: 24 IU/L (ref 0–40)
Albumin/Globulin Ratio: 2.1 (ref 1.1–2.5)
Alkaline Phosphatase: 57 IU/L (ref 39–117)
BUN/Creatinine Ratio: 15 (ref 10–22)
BUN: 17 mg/dL (ref 8–27)
CO2: 25 mmol/L (ref 18–29)
CREATININE: 1.1 mg/dL (ref 0.76–1.27)
Calcium: 10.7 mg/dL — ABNORMAL HIGH (ref 8.6–10.2)
Chloride: 100 mmol/L (ref 97–108)
GFR calc Af Amer: 74 mL/min/{1.73_m2} (ref >59)
GFR calc non Af Amer: 64 mL/min/{1.73_m2} (ref >59)
GLOBULIN, TOTAL: 2.2 g/dL (ref 1.5–4.5)
Glucose: 93 mg/dL (ref 65–99)
Potassium: 5.6 mmol/L — ABNORMAL HIGH (ref 3.5–5.2)
SODIUM: 142 mmol/L (ref 134–144)
Total Bilirubin: 0.5 mg/dL (ref 0.0–1.2)
Total Protein: 6.8 g/dL (ref 6.0–8.5)

## 2014-04-27 LAB — LIPID PANEL WITH LDL/HDL RATIO
Cholesterol, Total: 194 mg/dL (ref 100–199)
HDL: 41 mg/dL (ref >39)
LDL Calculated: 120 mg/dL — ABNORMAL HIGH (ref 0–99)
LDl/HDL Ratio: 2.9 ratio units (ref 0.0–3.6)
Triglycerides: 167 mg/dL — ABNORMAL HIGH (ref 0–149)
VLDL Cholesterol Cal: 33 mg/dL (ref 5–40)

## 2014-04-27 LAB — THYROID PANEL WITH TSH
Free Thyroxine Index: 2.1 (ref 1.2–4.9)
T3 Uptake Ratio: 29 % (ref 24–39)
T4 TOTAL: 7.3 ug/dL (ref 4.5–12.0)
TSH: 2.06 u[IU]/mL (ref 0.450–4.500)

## 2014-05-02 ENCOUNTER — Other Ambulatory Visit (INDEPENDENT_AMBULATORY_CARE_PROVIDER_SITE_OTHER): Payer: Medicare Other

## 2014-05-02 DIAGNOSIS — R7989 Other specified abnormal findings of blood chemistry: Secondary | ICD-10-CM

## 2014-05-03 LAB — POTASSIUM: POTASSIUM: 4.6 mmol/L (ref 3.5–5.2)

## 2014-05-14 ENCOUNTER — Ambulatory Visit (INDEPENDENT_AMBULATORY_CARE_PROVIDER_SITE_OTHER): Payer: Medicare Other

## 2014-05-14 DIAGNOSIS — Z23 Encounter for immunization: Secondary | ICD-10-CM

## 2014-05-21 ENCOUNTER — Other Ambulatory Visit: Payer: Self-pay | Admitting: Family Medicine

## 2014-06-08 ENCOUNTER — Other Ambulatory Visit: Payer: Self-pay | Admitting: Nurse Practitioner

## 2014-07-23 ENCOUNTER — Encounter: Payer: Self-pay | Admitting: Adult Health

## 2014-07-23 ENCOUNTER — Ambulatory Visit (INDEPENDENT_AMBULATORY_CARE_PROVIDER_SITE_OTHER): Payer: Medicare Other | Admitting: Adult Health

## 2014-07-23 VITALS — BP 128/67 | HR 91 | Temp 96.9°F | Ht 65.5 in | Wt 171.0 lb

## 2014-07-23 DIAGNOSIS — G10 Huntington's disease: Secondary | ICD-10-CM

## 2014-07-23 DIAGNOSIS — R269 Unspecified abnormalities of gait and mobility: Secondary | ICD-10-CM

## 2014-07-23 NOTE — Progress Notes (Signed)
PATIENT: Sergio Baker DOB: 21-Nov-1934  REASON FOR VISIT: follow up HISTORY FROM: patient  HISTORY OF PRESENT ILLNESS: Mr. Jupin is a 79 year old male with a history of Huntington's disease. The patient returns today for follow-up. He is currently taking haldol  1.5 mg twice a day and he states that this is working well for him. The patient reports that his memory has remained the same. The wife agrees. The patient continues to operate a motor vehicle without difficulty. Denies getting lost. At home the patient is able to complete all ADLs without assistance. He also helps to care for his wife. His gait has remained the same. No falls. Denies any new medical history.  HISTORY 01/17/14 [name]: -year-old right-handed white male with a history of Huntington's disease. The patient has a daughter with a similar problem, and he is less affected than his daughter. He is on low-dose Haldol taking 1.5 mg twice daily. He has done relatively well with minimal progression of the last one year. He does have some mild memory issues, but he continues to operate a motor vehicle without difficulty. He has not given up any activities of daily living secondary to memory. He denies any falls, but his gait is somewhat unstable. He continues to have some excessive daytime drowsiness, and he has problems with bilateral rotator cuff injuries. The patient returns to this office for an evaluation. He denies problems with choking or swallowing.  REVIEW OF SYSTEMS: Out of a complete 14 system review of symptoms, the patient complains only of the following symptoms, and all other reviewed systems are negative. Hearing loss Restless leg, daytime sleepiness Joint pain, back pain, walking difficulty Weakness  ALLERGIES: No Known Allergies  HOME MEDICATIONS: Outpatient Prescriptions Prior to Visit  Medication Sig Dispense Refill  . aspirin 81 MG tablet Take 81 mg by mouth daily.    Raelyn Ensign Pollen 550 MG CAPS Take 1  tablet by mouth.    . Calcium Carbonate-Vitamin D (CALCIUM 600/VITAMIN D PO) Take 1 tablet by mouth daily.    . Cholecalciferol (VITAMIN D3) 2000 UNITS capsule Take 2,000 Units by mouth daily.    . citalopram (CELEXA) 20 MG tablet Take 1 tablet (20 mg total) by mouth daily. 90 tablet 3  . fenofibrate 160 MG tablet TAKE ONE (1) TABLET EACH DAY 30 tablet 3  . glucosamine-chondroitin 500-400 MG tablet Take 1 tablet by mouth daily.    . haloperidol (HALDOL) 1 MG tablet Take 1 mg by mouth 2 (two) times daily.    Marland Kitchen levothyroxine (SYNTHROID, LEVOTHROID) 50 MCG tablet TAKE ONE (1) TABLET EACH DAY 90 tablet 3  . mesalamine (PENTASA) 500 MG CR capsule Take 1 capsule (500 mg total) by mouth 4 (four) times daily. 120 capsule 0  . Multiple Vitamin (MULTIVITAMIN WITH MINERALS) TABS Take 1 tablet by mouth daily.    . Omega-3 Fatty Acids 1200 MG CAPS Take 1,200 capsules by mouth daily.    Marland Kitchen omeprazole (PRILOSEC) 20 MG capsule TAKE ONE (1) CAPSULE EACH DAY 90 capsule 1  . rosuvastatin (CRESTOR) 20 MG tablet Take 20 mg by mouth daily.     No facility-administered medications prior to visit.    PAST MEDICAL HISTORY: Past Medical History  Diagnosis Date  . Proctocolitis, ulcerative     Diagnosed in the 1980s.  Marland Kitchen GERD (gastroesophageal reflux disease)   . A-fib   . Hemorrhoids   . Diverticula of colon   . Hiatal hernia     small  .  Schatzki's ring   . Ulcerative colitis     left side  . Huntington's disease 2010  . Depression   . Arthritis   . Enlarged prostate   . Joint pain   . Hearing loss   . Abnormality of gait   . Hyperlipidemia   . Memory deficits 01/17/2014  . HOH (hard of hearing)     PAST SURGICAL HISTORY: Past Surgical History  Procedure Laterality Date  . Appendectomy    . Tonsillectomy    . Cyst removed from urinary bladder    . Colonoscopy  02/2010    patchy erythema, minimal granularity in distal rectum, left-sided diverticula, ileal diverticulum.  Bx benign. next TCS 02/2012    . Esophagogastroduodenoscopy  04/2008    schatzki ring, s/p ED, hh  . Colonoscopy  03/24/2012    Dr. Gala Romney- inflammatory changes of the rectum and colon consistent with history of U/C. bx= benign colonic mucosa  . Shoulder arthroscopy w/ rotator cuff repair    . Lumbar fusion      FAMILY HISTORY: Family History  Problem Relation Age of Onset  . Lung cancer Mother 63    deceased  . Colon cancer Neg Hx   . Other Brother     Posttraumatic stress disorder  . Huntington's disease Daughter     SOCIAL HISTORY: History   Social History  . Marital Status: Married    Spouse Name: [name]    Number of Children: 1  . Years of Education: 12th   Occupational History  . retired Furniture conservator/restorer   . works as Administrator, [employer] at [employer]    Social History Main Topics  . Smoking status: Never Smoker   . Smokeless tobacco: Never Used  . Alcohol Use: No  . Drug Use: No  . Sexual Activity: Not on file   Other Topics Concern  . Not on file   Social History Narrative   Patient lives at home with spouse.   Caffeine Use: 1 cup weekly      PHYSICAL EXAM  Filed Vitals:   07/23/14 1510  BP: 128/67  Pulse: 91  Temp: 96.9 F (36.1 C)  TempSrc: Oral  Height: 5' 5.5" (1.664 m)  Weight: 171 lb (77.565 kg)   Body mass index is 28.01 kg/(m^2).  Generalized: Well developed, in no acute distress   Neurological examination  Mentation: Alert oriented to time, place, history taking. Follows all commands speech and language fluent Cranial nerve II-XII: Pupils were equal round reactive to light. Extraocular movements were full, visual field were full on confrontational test. Facial sensation and strength were normal. Uvula tongue midline. Head turning and shoulder shrug  were normal and symmetric. Motor: The motor testing reveals 5 over 5 strength of all 4 extremities. Good symmetric motor tone is noted throughout.  Sensory: Sensory testing is intact to soft touch on all 4 extremities. No evidence of  extinction is noted.  Coordination: Cerebellar testing reveals good finger-nose-finger and heel-to-shin bilaterally.  Gait and station: Gait is wide-based. Tandem gait is unsteady. Romberg is negative. No drift is seen.  Reflexes: Deep tendon reflexes are symmetric and normal bilaterally.     DIAGNOSTIC DATA (LABS, IMAGING, TESTING) - I reviewed patient records, labs, notes, testing and imaging myself where available.  Lab Results  Component Value Date   WBC 6.7 06/19/2013   HGB 14.8 06/19/2013   HCT 44.9 06/19/2013   MCV 88.2 06/19/2013   PLT 228 11/01/2007      Component Value Date/Time  NA 142 04/26/2014 1035   NA 141 12/26/2012 0907   K 4.6 05/02/2014 1325   CL 100 04/26/2014 1035   CO2 25 04/26/2014 1035   GLUCOSE 93 04/26/2014 1035   GLUCOSE 107* 12/26/2012 0907   BUN 17 04/26/2014 1035   BUN 20 12/26/2012 0907   CREATININE 1.10 04/26/2014 1035   CREATININE 0.90 12/26/2012 0907   CALCIUM 10.7* 04/26/2014 1035   PROT 6.8 04/26/2014 1035   PROT 7.4 12/26/2012 0907   ALBUMIN 4.4 12/26/2012 0907   AST 24 04/26/2014 1035   ALT 16 04/26/2014 1035   ALKPHOS 57 04/26/2014 1035   BILITOT 0.5 04/26/2014 1035   GFRNONAA 64 04/26/2014 1035   GFRNONAA 83 12/26/2012 0907   GFRAA 74 04/26/2014 1035   GFRAA >89 12/26/2012 0907   Lab Results  Component Value Date   CHOL 180 01/18/2014   HDL 41 04/26/2014   LDLCALC 120* 04/26/2014   TRIG 167* 04/26/2014   CHOLHDL 3.9 06/19/2013   Lab Results  Component Value Date   HGBA1C 6.1 09/05/2013   Lab Results  Component Value Date   VITAMINB12 607 11/21/2012   Lab Results  Component Value Date   TSH 2.060 04/26/2014      ASSESSMENT AND PLAN 79 y.o. year old male  has a past medical history of Proctocolitis, ulcerative; GERD (gastroesophageal reflux disease); A-fib; Hemorrhoids; Diverticula of colon; Hiatal hernia; Schatzki's ring; Ulcerative colitis; Huntington's disease (2010); Depression; Arthritis; Enlarged prostate;  Joint pain; Hearing loss; Abnormality of gait; Hyperlipidemia; Memory deficits (01/17/2014); and HOH (hard of hearing). here with :  1. Huntington disease  Overall the patient is doing well. He will continue taking Haldol. No refills needed today. If the patient's symptoms worsen or he develops new symptoms he should let us know. He will follow-up in 6 months.   Ward Givens, MSN, NP-C 07/23/2014, 3:47 PM [facility] [address], [place], [place] [address] [phone]  Note: This document was prepared with digital dictation and possible smart phrase technology. Any transcriptional errors that result from this process are unintentional.

## 2014-07-23 NOTE — Patient Instructions (Signed)
Huntington's Disease Huntington's disease (HD) results from genetically programmed degeneration of brain cells (neurons) in certain areas of the brain. This degeneration causes uncontrolled movements, loss of intellectual faculties, and emotional disturbance.  CAUSES  HD is a familial disease. It is passed from parent to child through a mutation in the normal gene. Each child of an HD parent has a 50-50 chance of inheriting the HD gene. If a child does not inherit the HD gene, he or she will not develop the disease. Also, he/she cannot pass it to future generations. But a person who inherits the HD gene will sooner or later develop the disease. Whether one child inherits the gene has no bearing on whether others will inherit it. SYMPTOMS  Some early problems (symptoms) include:  Mood swings.  Depression.  Irritability.  Trouble with the following:  Driving.  Learning new things.  Remembering a fact.  Making decisions. As the disease progresses, concentration on intellectual tasks becomes difficult. The patient may have difficulty feeding himself or herself and swallowing. The rate of disease progression and the age of onset vary from person to person.  DIAGNOSIS  Caregivers may diagnose HD by using:  A genetic test.  A complete medical history.  Neurological and laboratory tests. Presymptomatic testing is available for those who are at risk for carrying the HD gene. In 1 to 3 percent of individuals with HD, no family history of HD can be found. TREATMENT  Caregivers prescribe a number of medications. These help control emotional and movement problems associated with HD. Most drugs used to treat the symptoms of HD have side effects. They include:   Fatigue.  Restlessness.  Hyperexcitability. It is important for people with HD to maintain physical fitness as much as possible. Individuals who exercise and keep active tend to do better than those who do not. At this time, there  is no way to stop or reverse the course of HD. Now that the HD gene has been located, investigators continue to study it. They want to understand how it cause disease in the human body. Document Released: 07/24/2002 Document Revised: 10/26/2011 Document Reviewed: 08/03/2005 [facility] Patient Information 2015 [place], [place]. This information is not intended to replace advice given to you by your health care provider. Make sure you discuss any questions you have with your health care provider.

## 2014-07-23 NOTE — Progress Notes (Signed)
I have read the note, and I agree with the clinical assessment and plan.  Sergio Baker   

## 2014-09-14 ENCOUNTER — Ambulatory Visit (INDEPENDENT_AMBULATORY_CARE_PROVIDER_SITE_OTHER): Payer: Medicare Other | Admitting: Internal Medicine

## 2014-09-14 ENCOUNTER — Other Ambulatory Visit: Payer: Self-pay

## 2014-09-14 ENCOUNTER — Encounter: Payer: Self-pay | Admitting: Internal Medicine

## 2014-09-14 VITALS — BP 157/89 | HR 72 | Temp 98.6°F | Ht 63.0 in | Wt 171.6 lb

## 2014-09-14 DIAGNOSIS — K51919 Ulcerative colitis, unspecified with unspecified complications: Secondary | ICD-10-CM

## 2014-09-14 DIAGNOSIS — K219 Gastro-esophageal reflux disease without esophagitis: Secondary | ICD-10-CM

## 2014-09-14 MED ORDER — PEG-KCL-NACL-NASULF-NA ASC-C 100 G PO SOLR: 1.0000 | Freq: Once | ORAL | Status: AC

## 2014-09-14 NOTE — Progress Notes (Signed)
Primary Care Physician:  Redge Gainer, MD Primary Gastroenterologist:  Dr. Gala Romney  Pre-Procedure History & Physical: HPI:  Sergio Baker is a 80 y.o. male here for followup of GERD and left sided proctocolitis. 30+ year history proctocolitis. Last colonoscopy in 2013 suggested active left sided proctocolitis.  However, segmental biopsies of the rectum and colon demonstrated histologically normal appearing mucosa. He's been on low-dose Pentasa (i.e. 2 g daily) until a few months ago when he stopped taking this medication because he ran out.  The patient complains of insidious onset of nonbloody diarrhea over the past couple months; no antibiotic exposure. Had issues with diarrhea 2 years ago; stool studies were negative at that time. GERD symptoms well controlled on omeprazole 20 mg daily. Patient denies dysphagia or melena.  Weight is down 5 pounds since his last visit.  Patient reports having most of his energy and time spent caring for his ailing wife at home who apparently has advanced psoriatic arthritis. He was not able to play Gaylyn Rong at all this past Christmas.  Past Medical History  Diagnosis Date  . Proctocolitis, ulcerative     Diagnosed in the 1980s.  Marland Kitchen GERD (gastroesophageal reflux disease)   . A-fib   . Hemorrhoids   . Diverticula of colon   . Hiatal hernia     small  . Schatzki's ring   . Ulcerative colitis     left side  . Huntington's disease 2010  . Depression   . Arthritis   . Enlarged prostate   . Joint pain   . Hearing loss   . Abnormality of gait   . Hyperlipidemia   . Memory deficits 01/17/2014  . HOH (hard of hearing)     Past Surgical History  Procedure Laterality Date  . Appendectomy    . Tonsillectomy    . Cyst removed from urinary bladder    . Colonoscopy  02/2010    patchy erythema, minimal granularity in distal rectum, left-sided diverticula, ileal diverticulum.  Bx benign. next TCS 02/2012  . Esophagogastroduodenoscopy  04/2008    schatzki  ring, s/p ED, hh  . Colonoscopy  03/24/2012    Dr. Gala Romney- inflammatory changes of the rectum and colon consistent with history of U/C. bx= benign colonic mucosa  . Shoulder arthroscopy w/ rotator cuff repair    . Lumbar fusion      Prior to Admission medications   Medication Sig Start Date End Date Taking? Authorizing Provider  aspirin 81 MG tablet Take 81 mg by mouth daily.   Yes Historical Provider, MD  Bee Pollen 550 MG CAPS Take 1 tablet by mouth.   Yes Historical Provider, MD  Calcium Carbonate-Vitamin D (CALCIUM 600/VITAMIN D PO) Take 1 tablet by mouth daily.   Yes Historical Provider, MD  Cholecalciferol (VITAMIN D3) 2000 UNITS capsule Take 2,000 Units by mouth daily.   Yes Historical Provider, MD  citalopram (CELEXA) 20 MG tablet Take 1 tablet (20 mg total) by mouth daily. 06/19/13  Yes Lysbeth Penner, FNP  fenofibrate 160 MG tablet TAKE ONE (1) TABLET EACH DAY   Yes Mary-Margaret Hassell Done, FNP  glucosamine-chondroitin 500-400 MG tablet Take 1 tablet by mouth daily.   Yes Historical Provider, MD  haloperidol (HALDOL) 1 MG tablet Take 1 mg by mouth 2 (two) times daily.   Yes Historical Provider, MD  levothyroxine (SYNTHROID, LEVOTHROID) 50 MCG tablet TAKE ONE (1) TABLET EACH DAY 06/11/14  Yes Chipper Herb, MD  Multiple Vitamin (MULTIVITAMIN WITH MINERALS)  TABS Take 1 tablet by mouth daily.   Yes Historical Provider, MD  Omega-3 Fatty Acids 1200 MG CAPS Take 1,200 capsules by mouth daily.   Yes Historical Provider, MD  omeprazole (PRILOSEC) 20 MG capsule TAKE ONE (1) CAPSULE EACH DAY 05/22/14  Yes Lysbeth Penner, FNP  rosuvastatin (CRESTOR) 20 MG tablet Take 20 mg by mouth daily.   Yes Historical Provider, MD  mesalamine (PENTASA) 500 MG CR capsule Take 1 capsule (500 mg total) by mouth 4 (four) times daily. Patient not taking: Reported on 09/14/2014 09/19/12   Andria Meuse, NP    Allergies as of 09/14/2014  . (No Known Allergies)    Family History  Problem Relation Age of Onset    . Lung cancer Mother 2    deceased  . Colon cancer Neg Hx   . Other Brother     Posttraumatic stress disorder  . Huntington's disease Daughter     History   Social History  . Marital Status: Married    Spouse Name: [name]    Number of Children: 1  . Years of Education: 12th   Occupational History  . retired Furniture conservator/restorer   . works as Administrator, [employer] at [employer]    Social History Main Topics  . Smoking status: Never Smoker   . Smokeless tobacco: Never Used  . Alcohol Use: No  . Drug Use: No  . Sexual Activity: Not on file   Other Topics Concern  . Not on file   Social History Narrative   Patient lives at home with spouse.   Caffeine Use: 1 cup weekly    Review of Systems: See HPI, otherwise negative ROS  Physical Exam: BP 157/89 mmHg  Pulse 72  Temp(Src) 98.6 F (37 C) (Oral)  Ht 5' 3"  (1.6 m)  Wt 171 lb 9.6 oz (77.837 kg)  BMI 30.41 kg/m2 General:   Elderly, Spry heavily bearded gentleman pleasant and cooperative in NAD.  He displays constant involuntary movement of his extremities. Skin:  Intact without significant lesions or rashes. Eyes:  Sclera clear, no icterus.   Conjunctiva pink. Ears:  Normal auditory acuity. Nose:  No deformity, discharge,  or lesions. Mouth:  No deformity or lesions. Neck:  Supple; no masses or thyromegaly. No significant cervical adenopathy. Lungs:  Clear throughout to auscultation.   No wheezes, crackles, or rhonchi. No acute distress. Heart:  Regular rate and rhythm; no murmurs, clicks, rubs,  or gallops. Abdomen: Non-distended, normal bowel sounds.  Soft and nontender without appreciable mass or hepatosplenomegaly.  Pulses:  Normal pulses noted. Extremities:  Without clubbing or edema. Rectal:  Deferred until colonoscopy.    Impression:   Pleasant 80 year old gentleman with a multi-decade history of left sided proctocolitis now with insidiously recurrent diarrhea off mesalamine therapy. It's been about 2 and half years since his  last examination. I suspect he most likely is experiencing recrudescence of active inflammatory bowel disease off anti-inflammatory medication.  He is due for surveillance colonoscopy at this time. GERD symptoms continue to be well controlled with omeprazole 20 mg daily.   Recommendations:  Schedule surveillance colonoscopy (lonstanding ulcerative colitis)  Split movie Prep  Continue Omeprazole for GERD  Further recommendations to follow.       Notice: This dictation was prepared with Dragon dictation along with smaller phrase technology. Any transcriptional errors that result from this process are unintentional and may not be corrected upon review.

## 2014-09-14 NOTE — Patient Instructions (Addendum)
Schedule surveillance colonoscopy (lonstanding ulcerative colitis)  Split movie Prep  Continue Omeprazole for GERD

## 2014-10-03 ENCOUNTER — Telehealth: Payer: Self-pay

## 2014-10-03 NOTE — Telephone Encounter (Signed)
Pt is going to wait until they receive a bill before they pay anything.

## 2014-10-03 NOTE — Telephone Encounter (Signed)
Pt's wife is calling because they don't understand why he is having to pay a co-pay when they never had to before. She asked the hospital to seen her the bill so she could pay it and was told that they don't send out bills for the co-payment. She is wanting to know if the code could be changed or where can she pay the co-payment and if it has to be paid before Friday(he TCS is on Friday). Please advise

## 2014-10-05 ENCOUNTER — Encounter (HOSPITAL_COMMUNITY): Payer: Self-pay | Admitting: *Deleted

## 2014-10-05 ENCOUNTER — Ambulatory Visit (HOSPITAL_COMMUNITY)
Admission: RE | Admit: 2014-10-05 | Discharge: 2014-10-05 | Disposition: A | Discharge status: Home / Self Care | Payer: Medicare Other | Source: Ambulatory Visit | Attending: Internal Medicine | Admitting: Internal Medicine

## 2014-10-05 ENCOUNTER — Encounter (HOSPITAL_COMMUNITY)
Admission: RE | Disposition: A | Discharge status: Home / Self Care | Payer: Self-pay | Source: Ambulatory Visit | Attending: Internal Medicine

## 2014-10-05 DIAGNOSIS — F329 Major depressive disorder, single episode, unspecified: Secondary | ICD-10-CM | POA: Insufficient documentation

## 2014-10-05 DIAGNOSIS — K5130 Ulcerative (chronic) rectosigmoiditis without complications: Secondary | ICD-10-CM | POA: Diagnosis present

## 2014-10-05 DIAGNOSIS — F1590 Other stimulant use, unspecified, uncomplicated: Secondary | ICD-10-CM | POA: Insufficient documentation

## 2014-10-05 DIAGNOSIS — K529 Noninfective gastroenteritis and colitis, unspecified: Secondary | ICD-10-CM | POA: Diagnosis not present

## 2014-10-05 DIAGNOSIS — K633 Ulcer of intestine: Secondary | ICD-10-CM | POA: Diagnosis not present

## 2014-10-05 DIAGNOSIS — K5190 Ulcerative colitis, unspecified, without complications: Secondary | ICD-10-CM | POA: Insufficient documentation

## 2014-10-05 DIAGNOSIS — E785 Hyperlipidemia, unspecified: Secondary | ICD-10-CM | POA: Insufficient documentation

## 2014-10-05 DIAGNOSIS — K51919 Ulcerative colitis, unspecified with unspecified complications: Secondary | ICD-10-CM | POA: Diagnosis not present

## 2014-10-05 DIAGNOSIS — K219 Gastro-esophageal reflux disease without esophagitis: Secondary | ICD-10-CM | POA: Diagnosis not present

## 2014-10-05 DIAGNOSIS — Z7982 Long term (current) use of aspirin: Secondary | ICD-10-CM | POA: Insufficient documentation

## 2014-10-05 DIAGNOSIS — I4891 Unspecified atrial fibrillation: Secondary | ICD-10-CM | POA: Insufficient documentation

## 2014-10-05 HISTORY — PX: COLONOSCOPY: SHX5424

## 2014-10-05 SURGERY — COLONOSCOPY
Anesthesia: Moderate Sedation

## 2014-10-05 MED ORDER — MEPERIDINE HCL 100 MG/ML IJ SOLN
INTRAMUSCULAR | Status: DC | PRN
  Administered 2014-10-05: 50 mg via INTRAVENOUS
  Administered 2014-10-05: 25 mg via INTRAVENOUS

## 2014-10-05 MED ORDER — ONDANSETRON HCL 4 MG/2ML IJ SOLN
INTRAMUSCULAR | Status: DC | PRN
  Administered 2014-10-05: 4 mg via INTRAVENOUS

## 2014-10-05 MED ORDER — MIDAZOLAM HCL 5 MG/5ML IJ SOLN
INTRAMUSCULAR | Status: AC
  Filled 2014-10-05: qty 10

## 2014-10-05 MED ORDER — ONDANSETRON HCL 4 MG/2ML IJ SOLN
INTRAMUSCULAR | Status: AC
  Filled 2014-10-05: qty 2

## 2014-10-05 MED ORDER — MIDAZOLAM HCL 5 MG/5ML IJ SOLN
INTRAMUSCULAR | Status: DC | PRN
  Administered 2014-10-05: 2 mg via INTRAVENOUS
  Administered 2014-10-05: 1 mg via INTRAVENOUS
  Administered 2014-10-05: 2 mg via INTRAVENOUS

## 2014-10-05 MED ORDER — STERILE WATER FOR IRRIGATION IR SOLN
Status: DC | PRN
  Administered 2014-10-05: 11:00:00

## 2014-10-05 MED ORDER — SODIUM CHLORIDE 0.9 % IV SOLN
INTRAVENOUS | Status: DC
  Administered 2014-10-05: 10:00:00 via INTRAVENOUS

## 2014-10-05 MED ORDER — MEPERIDINE HCL 100 MG/ML IJ SOLN
INTRAMUSCULAR | Status: AC
  Filled 2014-10-05: qty 2

## 2014-10-05 NOTE — Discharge Instructions (Signed)
Colonoscopy Discharge Instructions  Read the instructions outlined below and refer to this sheet in the next few weeks. These discharge instructions provide you with general information on caring for yourself after you leave the hospital. Your doctor may also give you specific instructions. While your treatment has been planned according to the most current medical practices available, unavoidable complications occasionally occur. If you have any problems or questions after discharge, call Dr. Gala Romney at [phone]. ACTIVITY  You may resume your regular activity, but move at a slower pace for the next 24 hours.   Take frequent rest periods for the next 24 hours.   Walking will help get rid of the air and reduce the bloated feeling in your belly (abdomen).   No driving for 24 hours (because of the medicine (anesthesia) used during the test).    Do not sign any important legal documents or operate any machinery for 24 hours (because of the anesthesia used during the test).  NUTRITION  Drink plenty of fluids.   You may resume your normal diet as instructed by your doctor.   Begin with a light meal and progress to your normal diet. Heavy or fried foods are harder to digest and may make you feel sick to your stomach (nauseated).   Avoid alcoholic beverages for 24 hours or as instructed.  MEDICATIONS  You may resume your normal medications unless your doctor tells you otherwise.  WHAT YOU CAN EXPECT TODAY  Some feelings of bloating in the abdomen.   Passage of more gas than usual.   Spotting of blood in your stool or on the toilet paper.  IF YOU HAD POLYPS REMOVED DURING THE COLONOSCOPY:  No aspirin products for 7 days or as instructed.   No alcohol for 7 days or as instructed.   Eat a soft diet for the next 24 hours.  FINDING OUT THE RESULTS OF YOUR TEST Not all test results are available during your visit. If your test results are not back during the visit, make an appointment  with your caregiver to find out the results. Do not assume everything is normal if you have not heard from your caregiver or the medical facility. It is important for you to follow up on all of your test results.  SEEK IMMEDIATE MEDICAL ATTENTION IF:  You have more than a spotting of blood in your stool.   Your belly is swollen (abdominal distention).   You are nauseated or vomiting.   You have a temperature over 101.   You have abdominal pain or discomfort that is severe or gets worse throughout the day.    Further recommendations to follow pending review of pathology report   Colon Polyps Polyps are lumps of extra tissue growing inside the body. Polyps can grow in the large intestine (colon). Most colon polyps are noncancerous (benign). However, some colon polyps can become cancerous over time. Polyps that are larger than a pea may be harmful. To be safe, caregivers remove and test all polyps. CAUSES  Polyps form when mutations in the genes cause your cells to grow and divide even though no more tissue is needed. RISK FACTORS There are a number of risk factors that can increase your chances of getting colon polyps. They include:  Being older than 50 years.  Family history of colon polyps or colon cancer.  Long-term colon diseases, such as colitis or Crohn disease.  Being overweight.  Smoking.  Being inactive.  Drinking too much alcohol. SYMPTOMS  Most small  polyps do not cause symptoms. If symptoms are present, they may include:  Blood in the stool. The stool may look dark red or black.  Constipation or diarrhea that lasts longer than 1 week. DIAGNOSIS People often do not know they have polyps until their caregiver finds them during a regular checkup. Your caregiver can use 4 tests to check for polyps:  Digital rectal exam. The caregiver wears gloves and feels inside the rectum. This test would find polyps only in the rectum.  Barium enema. The caregiver puts a  liquid called barium into your rectum before taking X-rays of your colon. Barium makes your colon look white. Polyps are dark, so they are easy to see in the X-ray pictures.  Sigmoidoscopy. A thin, flexible tube (sigmoidoscope) is placed into your rectum. The sigmoidoscope has a light and tiny camera in it. The caregiver uses the sigmoidoscope to look at the last third of your colon.  Colonoscopy. This test is like sigmoidoscopy, but the caregiver looks at the entire colon. This is the most common method for finding and removing polyps. TREATMENT  Any polyps will be removed during a sigmoidoscopy or colonoscopy. The polyps are then tested for cancer. PREVENTION  To help lower your risk of getting more colon polyps:  Eat plenty of fruits and vegetables. Avoid eating fatty foods.  Do not smoke.  Avoid drinking alcohol.  Exercise every day.  Lose weight if recommended by your caregiver.  Eat plenty of calcium and folate. Foods that are rich in calcium include milk, cheese, and broccoli. Foods that are rich in folate include chickpeas, kidney beans, and spinach. HOME CARE INSTRUCTIONS Keep all follow-up appointments as directed by your caregiver. You may need periodic exams to check for polyps. SEEK MEDICAL CARE IF: You notice bleeding during a bowel movement. Document Released: 04/29/2004 Document Revised: 10/26/2011 Document Reviewed: 10/13/2011 [facility] Patient Information 2015 [place], [place]. This information is not intended to replace advice given to you by your health care provider. Make sure you discuss any questions you have with your health care provider.

## 2014-10-05 NOTE — Interval H&P Note (Signed)
History and Physical Interval Note:  10/05/2014 10:29 AM  Sergio Baker  has presented today for surgery, with the diagnosis of ulcerative colitis, Surveillance colonoscopy  The various methods of treatment have been discussed with the patient and family. After consideration of risks, benefits and other options for treatment, the patient has consented to  Procedure(s) with comments: COLONOSCOPY (N/A) - 1015am as a surgical intervention .  The patient's history has been reviewed, patient examined, no change in status, stable for surgery.  I have reviewed the patient's chart and labs.  Questions were answered to the patient's satisfaction.     Sergio Baker  No change. Colonoscopy per plan.The risks, benefits, limitations, alternatives and imponderables have been reviewed with the patient. Questions have been answered. All parties are agreeable.

## 2014-10-05 NOTE — Op Note (Signed)
[facility] [address] [place], [address]   COLONOSCOPY PROCEDURE REPORT  PATIENT: Sergio Baker, Sergio Baker  MR#: [id number] BIRTHDATE: [birth year] , 8  yrs. old GENDER: male ENDOSCOPIST: R.  Garfield Cornea, MD FACP [facility] REFERRED NA:TFTDDU Laurance Flatten, M.D. PROCEDURE DATE:  October 22, 2014 PROCEDURE:   Colonoscopy with biopsy INDICATIONS:Long-standing left-sided proctocolitis; surveillance examination. MEDICATIONS: Versed 5 mg IV and Demerol 75 mg IV in divided doses. Zofran 4 mg IV. ASA CLASS:       Class II  CONSENT: The risks, benefits, alternatives and imponderables including but not limited to bleeding, perforation as well as the possibility of a missed lesion have been reviewed.  The potential for biopsy, lesion removal, etc. have also been discussed. Questions have been answered.  All parties agreeable.  Please see the history and physical in the medical record for more information.  DESCRIPTION OF PROCEDURE:   After the risks benefits and alternatives of the procedure were thoroughly explained, informed consent was obtained.  The digital rectal exam revealed no abnormalities of the rectum.   The EC-[id number] ([id number])  endoscope was introduced through the anus and advanced to the cecum, which was identified by both the appendix and ileocecal valve. No adverse events experienced.   The quality of the prep was adequate  The instrument was then slowly withdrawn as the colon was fully examined.      COLON FINDINGS: Rectal mucosa appeared somewhat diffusely pale with some paucity of the normal vascular pattern.  There was no granularity erosion, nodularity or other abnormality seen. Examination of sigmoid segment revealed diffuse injection, granularity entire loss of the normal vascular pattern with scattered small erosions.  No nodularity or tumor seen these inflammatory changes rapidly tapered off into the descending segment where the colonic mucosa appeared entirely  normal all the way to the cecum.  Segmental biopsies of the ascending, transverse, descending, sigmoid and rectal segments taken.  Frequent sigmoid and rectal segment biopsies taken.  Retroflexion was performed. .  Withdrawal time=14 minutes 0 seconds.  The scope was withdrawn and the procedure completed. COMPLICATIONS: There were no immediate complications.  ENDOSCOPIC IMPRESSION: Abnormal rectal and sigmoid mucosa as described above?"status post segmental biopsy  RECOMMENDATIONS: Follow-up on pathology. Further recommendations to follow.  eSigned:  R. Garfield Cornea, MD [facility]al[facility] 10/22/2014 11:14 AM   cc:  CPT CODES: ICD CODES:  The ICD and CPT codes recommended by this software are interpretations from the data that the clinical staff has captured with the software.  The verification of the translation of this report to the ICD and CPT codes and modifiers is the sole responsibility of the health care institution and practicing physician where this report was generated.  [employer] will not be held responsible for the validity of the ICD and CPT codes included on this report.  AMA assumes no liability for data contained or not contained herein. CPT is a Designer, television/film set of the [employer].  PATIENT NAME:  Sergio Baker, Sergio Baker MR#: [id number]

## 2014-10-05 NOTE — H&P (View-Only) (Signed)
Primary Care Physician:  Redge Gainer, MD Primary Gastroenterologist:  Dr. Gala Romney  Pre-Procedure History & Physical: HPI:  Sergio Baker is a 80 y.o. male here for followup of GERD and left sided proctocolitis. 30+ year history proctocolitis. Last colonoscopy in 2013 suggested active left sided proctocolitis.  However, segmental biopsies of the rectum and colon demonstrated histologically normal appearing mucosa. He's been on low-dose Pentasa (i.e. 2 g daily) until a few months ago when he stopped taking this medication because he ran out.  The patient complains of insidious onset of nonbloody diarrhea over the past couple months; no antibiotic exposure. Had issues with diarrhea 2 years ago; stool studies were negative at that time. GERD symptoms well controlled on omeprazole 20 mg daily. Patient denies dysphagia or melena.  Weight is down 5 pounds since his last visit.  Patient reports having most of his energy and time spent caring for his ailing wife at home who apparently has advanced psoriatic arthritis. He was not able to play Gaylyn Rong at all this past Christmas.  Past Medical History  Diagnosis Date  . Proctocolitis, ulcerative     Diagnosed in the 1980s.  Marland Kitchen GERD (gastroesophageal reflux disease)   . A-fib   . Hemorrhoids   . Diverticula of colon   . Hiatal hernia     small  . Schatzki's ring   . Ulcerative colitis     left side  . Huntington's disease 2010  . Depression   . Arthritis   . Enlarged prostate   . Joint pain   . Hearing loss   . Abnormality of gait   . Hyperlipidemia   . Memory deficits 01/17/2014  . HOH (hard of hearing)     Past Surgical History  Procedure Laterality Date  . Appendectomy    . Tonsillectomy    . Cyst removed from urinary bladder    . Colonoscopy  02/2010    patchy erythema, minimal granularity in distal rectum, left-sided diverticula, ileal diverticulum.  Bx benign. next TCS 02/2012  . Esophagogastroduodenoscopy  04/2008    schatzki  ring, s/p ED, hh  . Colonoscopy  03/24/2012    Dr. Gala Romney- inflammatory changes of the rectum and colon consistent with history of U/C. bx= benign colonic mucosa  . Shoulder arthroscopy w/ rotator cuff repair    . Lumbar fusion      Prior to Admission medications   Medication Sig Start Date End Date Taking? Authorizing Provider  aspirin 81 MG tablet Take 81 mg by mouth daily.   Yes Historical Provider, MD  Bee Pollen 550 MG CAPS Take 1 tablet by mouth.   Yes Historical Provider, MD  Calcium Carbonate-Vitamin D (CALCIUM 600/VITAMIN D PO) Take 1 tablet by mouth daily.   Yes Historical Provider, MD  Cholecalciferol (VITAMIN D3) 2000 UNITS capsule Take 2,000 Units by mouth daily.   Yes Historical Provider, MD  citalopram (CELEXA) 20 MG tablet Take 1 tablet (20 mg total) by mouth daily. 06/19/13  Yes Lysbeth Penner, FNP  fenofibrate 160 MG tablet TAKE ONE (1) TABLET EACH DAY   Yes Mary-Margaret Hassell Done, FNP  glucosamine-chondroitin 500-400 MG tablet Take 1 tablet by mouth daily.   Yes Historical Provider, MD  haloperidol (HALDOL) 1 MG tablet Take 1 mg by mouth 2 (two) times daily.   Yes Historical Provider, MD  levothyroxine (SYNTHROID, LEVOTHROID) 50 MCG tablet TAKE ONE (1) TABLET EACH DAY 06/11/14  Yes Chipper Herb, MD  Multiple Vitamin (MULTIVITAMIN WITH MINERALS)  TABS Take 1 tablet by mouth daily.   Yes Historical Provider, MD  Omega-3 Fatty Acids 1200 MG CAPS Take 1,200 capsules by mouth daily.   Yes Historical Provider, MD  omeprazole (PRILOSEC) 20 MG capsule TAKE ONE (1) CAPSULE EACH DAY 05/22/14  Yes Lysbeth Penner, FNP  rosuvastatin (CRESTOR) 20 MG tablet Take 20 mg by mouth daily.   Yes Historical Provider, MD  mesalamine (PENTASA) 500 MG CR capsule Take 1 capsule (500 mg total) by mouth 4 (four) times daily. Patient not taking: Reported on 09/14/2014 09/19/12   Andria Meuse, NP    Allergies as of 09/14/2014  . (No Known Allergies)    Family History  Problem Relation Age of Onset    . Lung cancer Mother 17    deceased  . Colon cancer Neg Hx   . Other Brother     Posttraumatic stress disorder  . Huntington's disease Daughter     History   Social History  . Marital Status: Married    Spouse Name: [name]    Number of Children: 1  . Years of Education: 12th   Occupational History  . retired Furniture conservator/restorer   . works as Administrator, arts at [employer]    Social History Main Topics  . Smoking status: Never Smoker   . Smokeless tobacco: Never Used  . Alcohol Use: No  . Drug Use: No  . Sexual Activity: Not on file   Other Topics Concern  . Not on file   Social History Narrative   Patient lives at home with spouse.   Caffeine Use: 1 cup weekly    Review of Systems: See HPI, otherwise negative ROS  Physical Exam: BP 157/89 mmHg  Pulse 72  Temp(Src) 98.6 F (37 C) (Oral)  Ht 5' 3"  (1.6 m)  Wt 171 lb 9.6 oz (77.837 kg)  BMI 30.41 kg/m2 General:   Elderly, Spry heavily bearded gentleman pleasant and cooperative in NAD.  He displays constant involuntary movement of his extremities. Skin:  Intact without significant lesions or rashes. Eyes:  Sclera clear, no icterus.   Conjunctiva pink. Ears:  Normal auditory acuity. Nose:  No deformity, discharge,  or lesions. Mouth:  No deformity or lesions. Neck:  Supple; no masses or thyromegaly. No significant cervical adenopathy. Lungs:  Clear throughout to auscultation.   No wheezes, crackles, or rhonchi. No acute distress. Heart:  Regular rate and rhythm; no murmurs, clicks, rubs,  or gallops. Abdomen: Non-distended, normal bowel sounds.  Soft and nontender without appreciable mass or hepatosplenomegaly.  Pulses:  Normal pulses noted. Extremities:  Without clubbing or edema. Rectal:  Deferred until colonoscopy.    Impression:   Pleasant 80 year old gentleman with a multi-decade history of left sided proctocolitis now with insidiously recurrent diarrhea off mesalamine therapy. It's been about 2 and half years since his  last examination. I suspect he most likely is experiencing recrudescence of active inflammatory bowel disease off anti-inflammatory medication.  He is due for surveillance colonoscopy at this time. GERD symptoms continue to be well controlled with omeprazole 20 mg daily.   Recommendations:  Schedule surveillance colonoscopy (lonstanding ulcerative colitis)  Split movie Prep  Continue Omeprazole for GERD  Further recommendations to follow.       Notice: This dictation was prepared with Dragon dictation along with smaller phrase technology. Any transcriptional errors that result from this process are unintentional and may not be corrected upon review.

## 2014-10-08 ENCOUNTER — Other Ambulatory Visit: Payer: Self-pay | Admitting: Family Medicine

## 2014-10-09 ENCOUNTER — Encounter: Payer: Self-pay | Admitting: Internal Medicine

## 2014-10-09 ENCOUNTER — Telehealth: Payer: Self-pay

## 2014-10-09 NOTE — Telephone Encounter (Signed)
APPOINTMENT MADE AND LETTER SENT °

## 2014-10-09 NOTE — Telephone Encounter (Signed)
Letter mailed to the pt. 

## 2014-10-09 NOTE — Telephone Encounter (Signed)
Per RMR- Send letter to patient.  Send copy of letter with path to referring provider and PCP.  Arrange f/u appt w LSL in 6 weeks to re-assess abd pain.

## 2014-10-10 ENCOUNTER — Encounter (HOSPITAL_COMMUNITY): Payer: Self-pay | Admitting: Internal Medicine

## 2014-10-25 ENCOUNTER — Ambulatory Visit (INDEPENDENT_AMBULATORY_CARE_PROVIDER_SITE_OTHER): Payer: Medicare Other

## 2014-10-25 ENCOUNTER — Encounter: Payer: Self-pay | Admitting: Nurse Practitioner

## 2014-10-25 ENCOUNTER — Telehealth: Payer: Self-pay | Admitting: Nurse Practitioner

## 2014-10-25 ENCOUNTER — Ambulatory Visit (INDEPENDENT_AMBULATORY_CARE_PROVIDER_SITE_OTHER): Payer: Medicare Other | Admitting: Nurse Practitioner

## 2014-10-25 VITALS — BP 145/78 | HR 65 | Temp 97.1°F | Ht 63.0 in | Wt 168.0 lb

## 2014-10-25 DIAGNOSIS — E034 Atrophy of thyroid (acquired): Secondary | ICD-10-CM

## 2014-10-25 DIAGNOSIS — E785 Hyperlipidemia, unspecified: Secondary | ICD-10-CM

## 2014-10-25 DIAGNOSIS — E0789 Other specified disorders of thyroid: Secondary | ICD-10-CM | POA: Diagnosis not present

## 2014-10-25 DIAGNOSIS — F329 Major depressive disorder, single episode, unspecified: Secondary | ICD-10-CM | POA: Diagnosis not present

## 2014-10-25 DIAGNOSIS — K219 Gastro-esophageal reflux disease without esophagitis: Secondary | ICD-10-CM | POA: Diagnosis not present

## 2014-10-25 DIAGNOSIS — E038 Other specified hypothyroidism: Secondary | ICD-10-CM

## 2014-10-25 DIAGNOSIS — I482 Chronic atrial fibrillation, unspecified: Secondary | ICD-10-CM

## 2014-10-25 DIAGNOSIS — F32A Depression, unspecified: Secondary | ICD-10-CM

## 2014-10-25 DIAGNOSIS — G10 Huntington's disease: Secondary | ICD-10-CM

## 2014-10-25 DIAGNOSIS — Z23 Encounter for immunization: Secondary | ICD-10-CM

## 2014-10-25 MED ORDER — FENOFIBRATE 160 MG PO TABS: ORAL_TABLET | ORAL | Status: DC

## 2014-10-25 MED ORDER — ROSUVASTATIN CALCIUM 20 MG PO TABS: 20.0000 mg | ORAL_TABLET | Freq: Every day | ORAL | Status: DC

## 2014-10-25 MED ORDER — CITALOPRAM HYDROBROMIDE 20 MG PO TABS: ORAL_TABLET | ORAL | Status: DC

## 2014-10-25 MED ORDER — OMEPRAZOLE 20 MG PO CPDR: DELAYED_RELEASE_CAPSULE | ORAL | Status: DC

## 2014-10-25 NOTE — Progress Notes (Signed)
Subjective:    Patient ID: Sergio Baker, male    DOB: 04-Feb-1935, 80 y.o.   MRN: [id number]  HPI  Patient in today for follow up of Chronic medical problems. He had a colonoscopy 1 month ago and was restarted on pentasa which he had not been taking for a while due to availability. He reports seeing some improvement with his bowel patterns and character with this.   Huntington's No much change since last visit-Was encouraged to use a cane as gait has been more unsteady but has not felt the need to use it as he has not fallen.  Hypothyroidism  Currnetly on levothyroxin 50 mcg- doing well- no c/o fatigue Depression Currently on celexa which he says is working well without side effects. Hyperlipidemia  Patient has had for >10 years. He is currently taking crestor without any problems. He is trying to watch diet. GERD Omeprazole daily keeps symptoms under control   Review of Systems  Constitutional: Negative.   HENT: Negative.   Respiratory: Negative.  Negative for chest tightness and shortness of breath.   Cardiovascular: Negative.  Negative for chest pain.  Gastrointestinal: Positive for diarrhea and blood in stool (Reports intermittent pink blood in stool as well as mucous, reports has improved some with pentasa.). Negative for nausea and abdominal pain.  Genitourinary: Negative.   Musculoskeletal: Positive for arthralgias and gait problem (Some unsteadiness, denies falls. ). Negative for joint swelling and neck stiffness.  Neurological: Negative.  Negative for tremors, syncope and weakness.  Psychiatric/Behavioral: Negative.   All other systems reviewed and are negative.      Objective:   Physical Exam  Constitutional: He is oriented to person, place, and time. He appears well-developed and well-nourished.  HENT:  Head: Normocephalic.  Right Ear: External ear normal.  Left Ear: External ear normal.  Nose: Nose normal.  Mouth/Throat: Oropharynx is clear and moist.  Eyes:  EOM are normal. Pupils are equal, round, and reactive to light.  Neck: Normal range of motion. Neck supple. No JVD present. No thyromegaly present.  Cardiovascular: Normal rate, normal heart sounds and intact distal pulses.  Exam reveals no gallop and no friction rub.   No murmur heard. Irregular heart rate   Pulmonary/Chest: Effort normal and breath sounds normal. No respiratory distress. He has no wheezes. He has no rales. He exhibits no tenderness.  Abdominal: Soft. Bowel sounds are normal. He exhibits no mass. There is no tenderness.  Genitourinary: Prostate normal and penis normal.  Musculoskeletal: Normal range of motion. He exhibits no edema.  Constant jerking movements. Gait slightly unsteady.  Lymphadenopathy:    He has no cervical adenopathy.  Neurological: He is alert and oriented to person, place, and time. No cranial nerve deficit. Abnormal gait: Unsteady gait.   Skin: Skin is warm and dry.  Psychiatric: He has a normal mood and affect. His speech is normal and behavior is normal. Judgment and thought content normal.  Vitals reviewed.  BP 145/78 mmHg  Pulse 65  Temp(Src) 97.1 F (36.2 C) (Oral)  Ht 5' 3"  (1.6 m)  Wt 168 lb (76.204 kg)  BMI 29.77 kg/m2  EKG- atrial Reed Pandy, FNP Chest x ray- chronic bronchitic changes- otherwise normal-Preliminary reading by Ronnald Collum, FNP  [facility]       Assessment & Plan:   1. Chronic atrial fibrillation - EKG 12-Lead  2. Gastroesophageal reflux disease without esophagitis Avoid spicy and fatty foods Do not eat 2 hours prior to bedtime - omeprazole (PRILOSEC) 20  MG capsule; TAKE ONE (1) CAPSULE EACH DAY  Dispense: 90 capsule; Refill: 1  3. Hypothyroidism due to acquired atrophy of thyroid - Thyroid Panel With TSH  4. Hyperlipidemia with target LDL less than 100 Low fat diet - DG Chest 2 View; Future - CMP14+EGFR - NMR, lipoprofile - fenofibrate 160 MG tablet; TAKE ONE (1) TABLET EACH DAY  Dispense: 30  tablet; Refill: 5 - rosuvastatin (CRESTOR) 20 MG tablet; Take 1 tablet (20 mg total) by mouth daily.  Dispense: 30 tablet; Refill: 5  5. Huntington's chorea Keep follow up with specialist  6. Depression Stress management - citalopram (CELEXA) 20 MG tablet; TAKE ONE (1) TABLET EACH DAY  Dispense: 30 tablet; Refill: 5    Labs pending Health maintenance reviewed Diet and exercise encouraged Continue all meds Follow up  In 3 months   [place], FNP

## 2014-10-25 NOTE — Telephone Encounter (Signed)
Stp's wife she says his arm is discolored at the blood draw site and that there is some swelling. Pt can still move and bend the elbow without difficulty, the swelling isn't getting worse and the pt is applying ice for comfort. Advised to continue comfort measures and to CB if worsens or if becomes painful. Pt voiced understanding.

## 2014-10-25 NOTE — Addendum Note (Signed)
Addended by: Rolena Infante on: 10/25/2014 12:51 PM   Modules accepted: Orders

## 2014-10-26 ENCOUNTER — Telehealth: Payer: Self-pay | Admitting: Nurse Practitioner

## 2014-10-26 ENCOUNTER — Other Ambulatory Visit: Payer: Self-pay | Admitting: Nurse Practitioner

## 2014-10-26 LAB — CMP14+EGFR
ALBUMIN: 4.5 g/dL (ref 3.5–4.8)
ALT: 17 IU/L (ref 0–44)
AST: 26 IU/L (ref 0–40)
Albumin/Globulin Ratio: 1.7 (ref 1.1–2.5)
Alkaline Phosphatase: 58 IU/L (ref 39–117)
BUN/Creatinine Ratio: 20 (ref 10–22)
BUN: 18 mg/dL (ref 8–27)
Bilirubin Total: 0.5 mg/dL (ref 0.0–1.2)
CALCIUM: 9.7 mg/dL (ref 8.6–10.2)
CHLORIDE: 103 mmol/L (ref 97–108)
CO2: 24 mmol/L (ref 18–29)
Creatinine, Ser: 0.88 mg/dL (ref 0.76–1.27)
GFR calc Af Amer: 96 mL/min/{1.73_m2} (ref >59)
GFR calc non Af Amer: 83 mL/min/{1.73_m2} (ref >59)
Globulin, Total: 2.6 g/dL (ref 1.5–4.5)
Glucose: 103 mg/dL — ABNORMAL HIGH (ref 65–99)
Potassium: 4.7 mmol/L (ref 3.5–5.2)
Sodium: 142 mmol/L (ref 134–144)
Total Protein: 7.1 g/dL (ref 6.0–8.5)

## 2014-10-26 LAB — NMR, LIPOPROFILE
Cholesterol: 220 mg/dL — ABNORMAL HIGH (ref 100–199)
HDL CHOLESTEROL BY NMR: 37 mg/dL — AB (ref >39)
HDL PARTICLE NUMBER: 27.4 umol/L — AB (ref >=30.5)
LDL Particle Number: 1782 nmol/L — ABNORMAL HIGH (ref <1000)
LDL Size: 20.1 nm (ref >20.5)
LDL-C: 159 mg/dL — AB (ref 0–99)
LP-IR Score: 65 — ABNORMAL HIGH (ref <=45)
Small LDL Particle Number: 1162 nmol/L — ABNORMAL HIGH (ref <=527)
Triglycerides by NMR: 122 mg/dL (ref 0–149)

## 2014-10-26 LAB — THYROID PANEL WITH TSH
Free Thyroxine Index: 2.2 (ref 1.2–4.9)
T3 Uptake Ratio: 31 % (ref 24–39)
T4, Total: 7.2 ug/dL (ref 4.5–12.0)
TSH: 2.58 u[IU]/mL (ref 0.450–4.500)

## 2014-10-26 MED ORDER — ATORVASTATIN CALCIUM 40 MG PO TABS: 40.0000 mg | ORAL_TABLET | Freq: Every day | ORAL | Status: DC

## 2014-10-27 ENCOUNTER — Other Ambulatory Visit: Payer: Self-pay

## 2014-10-27 MED ORDER — HALOPERIDOL 1 MG PO TABS: 1.5000 mg | ORAL_TABLET | Freq: Two times a day (BID) | ORAL | Status: DC

## 2014-11-01 ENCOUNTER — Telehealth: Payer: Self-pay | Admitting: Internal Medicine

## 2014-11-01 NOTE — Telephone Encounter (Signed)
Pt's wife called to say that patient was out of Pentasa and do we have any samples. I told her that I would have to check with nurse and someone will call her back. [phone]

## 2014-11-01 NOTE — Telephone Encounter (Signed)
We do not have any samples of pentasa. I dont know if we will be getting anymore because we have not gotten any in awhile. pts wife is aware. She said pentasa is $1500.00 and they cannot afford it and want to know if something cheaper can be called in or if there is a generic. I tried to check their formulary, ([place] complete) and pentasa is tier 4 and considered non- formulary. The only other mesalamine that I could find on the formulary was Lialda, it is tier 3.   Dr.Rourk- do you want to try something else?

## 2014-11-05 NOTE — Telephone Encounter (Signed)
If no prior intolerances or allergies, can try Lialda 4.8 g daily. Let's give him 2-3 weeks worth of samples and see how worse from him.  If he likes a week ago with a long-term prescription.

## 2014-11-06 NOTE — Telephone Encounter (Signed)
Per pts wife he has not tried lialda before. Samples are at the front desk. He has an appt with [facility] on 11/21/14 and they are aware of that appt and that it is with [facility].

## 2014-11-21 ENCOUNTER — Encounter: Payer: Self-pay | Admitting: Gastroenterology

## 2014-11-21 ENCOUNTER — Ambulatory Visit (INDEPENDENT_AMBULATORY_CARE_PROVIDER_SITE_OTHER): Payer: Medicare Other | Admitting: Gastroenterology

## 2014-11-21 VITALS — BP 122/76 | HR 61 | Temp 97.6°F | Ht 65.0 in | Wt 168.0 lb

## 2014-11-21 DIAGNOSIS — K51211 Ulcerative (chronic) proctitis with rectal bleeding: Secondary | ICD-10-CM

## 2014-11-21 DIAGNOSIS — K219 Gastro-esophageal reflux disease without esophagitis: Secondary | ICD-10-CM | POA: Diagnosis not present

## 2014-11-21 MED ORDER — MESALAMINE 1.2 G PO TBEC: 4.8000 g | DELAYED_RELEASE_TABLET | Freq: Every day | ORAL | Status: DC

## 2014-11-21 NOTE — Progress Notes (Signed)
Primary Care Physician: Chevis Pretty, FNP  Primary Gastroenterologist:  Garfield Cornea, MD   Chief Complaint  Patient presents with  . Abdominal Pain    HPI: Sergio Baker is a 80 y.o. male here for follow-up. Recently underwent surveillance colonoscopy for 30+ year h/o proctocolitis. Prior to TCS he ran our of Pentasa for about two months (was on 2 gramds daily) and subsequently had recurrent diarrhea. TCS showed active colitis in rectosigmoid region. Since his procedure he was started on Lialda. Has been on only for about 5 days. On samples at this time. Continues to have loose stools with mucous. 4-5 stools daily. Mostly in AM. No associated abdominal pain. Hematochezia as resolved. appetite good. Seems to be better on Lialda. No heartburn, vomiting, weight loss.       Current Outpatient Prescriptions  Medication Sig Dispense Refill  . aspirin EC 81 MG tablet Take 81 mg by mouth daily.    Marland Kitchen atorvastatin (LIPITOR) 40 MG tablet Take 1 tablet (40 mg total) by mouth daily. 90 tablet 1  . Bee Pollen 550 MG CAPS Take 1 tablet by mouth.    . Calcium Carbonate-Vitamin D (CALCIUM 600/VITAMIN D PO) Take 1 tablet by mouth daily.    . Cholecalciferol (VITAMIN D3) 2000 UNITS capsule Take 2,000 Units by mouth daily.    . citalopram (CELEXA) 20 MG tablet TAKE ONE (1) TABLET EACH DAY 30 tablet 5  . fenofibrate 160 MG tablet TAKE ONE (1) TABLET EACH DAY 30 tablet 5  . glucosamine-chondroitin 500-400 MG tablet Take 1 tablet by mouth daily.    . haloperidol (HALDOL) 1 MG tablet Take 1.5 tablets (1.5 mg total) by mouth 2 (two) times daily. 90 tablet 3  . levothyroxine (SYNTHROID, LEVOTHROID) 50 MCG tablet TAKE ONE (1) TABLET EACH DAY 90 tablet 3  . Mesalamine (LIALDA PO) Take 4.8 mg by mouth daily.     . Multiple Vitamin (MULTIVITAMIN WITH MINERALS) TABS Take 1 tablet by mouth daily.    . Omega-3 Fatty Acids 1200 MG CAPS Take 1,200 capsules by mouth daily.    Marland Kitchen omeprazole (PRILOSEC)  20 MG capsule TAKE ONE (1) CAPSULE EACH DAY 90 capsule 1  .         No current facility-administered medications for this visit.    Allergies as of 11/21/2014  . (No Known Allergies)   Past Surgical History  Procedure Laterality Date  . Appendectomy    . Tonsillectomy    . Cyst removed from urinary bladder    . Colonoscopy  02/2010    patchy erythema, minimal granularity in distal rectum, left-sided diverticula, ileal diverticulum.  Bx benign. next TCS 02/2012  . Esophagogastroduodenoscopy  04/2008    schatzki ring, s/p ED, hh  . Colonoscopy  03/24/2012    Dr. Gala Romney- inflammatory changes of the rectum and colon consistent with history of U/C. bx= benign colonic mucosa  . Shoulder arthroscopy w/ rotator cuff repair    . Lumbar fusion    . Colonoscopy N/A 10/05/2014    RMR: abnormal rectal and sigmoid mucosa as described above status post segmental biopsy. active colitis sigmoid colon.    ROS:  General: Negative for anorexia, weight loss, fever, chills, fatigue, weakness. ENT: Negative for hoarseness, difficulty swallowing , nasal congestion. CV: Negative for chest pain, angina, palpitations, dyspnea on exertion, peripheral edema.  Respiratory: Negative for dyspnea at rest, dyspnea on exertion, cough, sputum, wheezing.  GI: See history of present illness. GU:  Negative for  dysuria, hematuria, urinary incontinence, urinary frequency, nocturnal urination.  Endo: Negative for unusual weight change.    Physical Examination:   BP 122/76 mmHg  Pulse 61  Temp(Src) 97.6 F (36.4 C) (Oral)  Ht 5' 5"  (1.651 m)  Wt 168 lb (76.204 kg)  BMI 27.96 kg/m2  General: Well-nourished, well-developed in no acute distress.  Eyes: No icterus. Mouth: Oropharyngeal mucosa moist and pink , no lesions erythema or exudate. Lungs: Clear to auscultation bilaterally.  Heart: Regular rate and rhythm, no murmurs rubs or gallops.  Abdomen: Bowel sounds are normal, nontender, nondistended, no  hepatosplenomegaly or masses, no abdominal bruits or hernia , no rebound or guarding.   Extremities: No lower extremity edema. No clubbing or deformities. Neuro: Alert and oriented x 4   Skin: Warm and dry, no jaundice.   Psych: Alert and cooperative, normal mood and affect.  Labs:  Lab Results  Component Value Date   CREATININE 0.88 10/25/2014   BUN 18 10/25/2014   NA 142 10/25/2014   K 4.7 10/25/2014   CL 103 10/25/2014   CO2 24 10/25/2014   Lab Results  Component Value Date   ALT 17 10/25/2014   AST 26 10/25/2014   ALKPHOS 58 10/25/2014   BILITOT 0.5 10/25/2014   Lab Results  Component Value Date   TSH 2.580 10/25/2014     Imaging Studies: Dg Chest 2 View  10/25/2014   CLINICAL DATA:  Annual physical.  Screening.  EXAM: CHEST  2 VIEW  COMPARISON:  None.  FINDINGS: The heart size and mediastinal contours are within normal limits. Mild tortuosity of thoracic aorta noted. Both lungs are clear. No evidence of pleural effusion. No mass or lymphadenopathy identified. Degenerative disc disease seen throughout the thoracic spine.  IMPRESSION: No active cardiopulmonary disease.   Electronically Signed   By: Earle Gell M.D.   On: 10/25/2014 14:32

## 2014-11-21 NOTE — Patient Instructions (Signed)
1. Continue Lialda four daily with breakfast. RX sent to your pharmacy. 2. If you continue to have loose frequent stool after on the medication for few more weeks, please call the office. 3. Return to see Dr. Gala Romney in 3 months.

## 2014-11-24 ENCOUNTER — Encounter: Payer: Self-pay | Admitting: Gastroenterology

## 2014-11-24 NOTE — Assessment & Plan Note (Signed)
Doing well. Continue omeprazole 35m dialy.

## 2014-11-24 NOTE — Assessment & Plan Note (Signed)
Recently started Lialda 4.8g daily. Improved symptoms, bloody stools resolved. Suspect stool frequency will improve over the next couple of weeks. He will call if he doesn't achieve clinical remission in the next couple of weeks. Otherwise return to the office in 3 months to see Dr. Gala Romney.

## 2014-11-26 NOTE — Progress Notes (Signed)
cc'ed to pcp °

## 2014-11-29 DIAGNOSIS — M4727 Other spondylosis with radiculopathy, lumbosacral region: Secondary | ICD-10-CM | POA: Diagnosis not present

## 2014-11-29 DIAGNOSIS — M5126 Other intervertebral disc displacement, lumbar region: Secondary | ICD-10-CM | POA: Diagnosis not present

## 2014-11-29 DIAGNOSIS — B379 Candidiasis, unspecified: Secondary | ICD-10-CM | POA: Diagnosis not present

## 2014-12-25 ENCOUNTER — Ambulatory Visit: Payer: Self-pay | Admitting: *Deleted

## 2014-12-27 ENCOUNTER — Ambulatory Visit (INDEPENDENT_AMBULATORY_CARE_PROVIDER_SITE_OTHER): Payer: Medicare Other | Admitting: *Deleted

## 2014-12-27 ENCOUNTER — Encounter: Payer: Self-pay | Admitting: *Deleted

## 2014-12-27 VITALS — BP 128/79 | HR 83 | Ht 66.0 in | Wt 171.0 lb

## 2014-12-27 DIAGNOSIS — Z0000 Encounter for general adult medical examination without abnormal findings: Secondary | ICD-10-CM

## 2014-12-27 NOTE — Progress Notes (Signed)
Subjective:   Sergio Baker is a 79 y.o. male who presents for an Initial Medicare Annual Wellness Visit.  He is doing well with no complaints today.  He is retired and lives with his wife Sergio Baker of 86 years.  He was diagnosed with Huntington's Disease 3 years ago, and has problems with involuntary movements, this has hindered his exercise habits.  He enjoys being active in his church, playing [employer] for the community at Christmas time, and watching his grandchildren play sports.  He has one daughter who also has Huntington's disease which is more advanced than his case.           Objective:    Today's Vitals   12/27/14 1508  BP: 128/79  Pulse: 83  Height: 5' 6"  (1.676 m)  Weight: 171 lb (77.565 kg)  PainSc: 0-No pain    Current Medications (verified) Outpatient Encounter Prescriptions as of 12/27/2014  Medication Sig  . aspirin EC 81 MG tablet Take 81 mg by mouth daily.  Raelyn Ensign Pollen 550 MG CAPS Take 1 tablet by mouth.  . Calcium Carbonate-Vitamin D (CALCIUM 600/VITAMIN D PO) Take 1 tablet by mouth daily.  . Cholecalciferol (VITAMIN D3) 2000 UNITS capsule Take 2,000 Units by mouth daily.  . citalopram (CELEXA) 20 MG tablet TAKE ONE (1) TABLET EACH DAY  . glucosamine-chondroitin 500-400 MG tablet Take 1 tablet by mouth daily.  . haloperidol (HALDOL) 1 MG tablet Take 1.5 tablets (1.5 mg total) by mouth 2 (two) times daily.  Marland Kitchen levothyroxine (SYNTHROID, LEVOTHROID) 50 MCG tablet TAKE ONE (1) TABLET EACH DAY  . mesalamine (LIALDA) 1.2 G EC tablet Take 4 tablets (4.8 g total) by mouth daily with breakfast.  . Multiple Vitamin (MULTIVITAMIN WITH MINERALS) TABS Take 1 tablet by mouth daily.  . Omega-3 Fatty Acids 1200 MG CAPS Take 1,200 capsules by mouth daily.  Marland Kitchen omeprazole (PRILOSEC) 20 MG capsule TAKE ONE (1) CAPSULE EACH DAY  . atorvastatin (LIPITOR) 40 MG tablet Take 1 tablet (40 mg total) by mouth daily. (Patient not taking: Reported on 12/27/2014)  . fenofibrate 160 MG  tablet TAKE ONE (1) TABLET EACH DAY (Patient not taking: Reported on 12/27/2014)   No facility-administered encounter medications on file as of 12/27/2014.    Allergies (verified) Review of patient's allergies indicates no known allergies.   History: Past Medical History  Diagnosis Date  . Proctocolitis, ulcerative     Diagnosed in the 1980s.  Marland Kitchen GERD (gastroesophageal reflux disease)   . A-fib   . Hemorrhoids   . Diverticula of colon   . Hiatal hernia     small  . Schatzki's ring   . Ulcerative colitis     left side  . Huntington's disease 2010  . Depression   . Arthritis   . Enlarged prostate   . Joint pain   . Hearing loss   . Abnormality of gait   . Hyperlipidemia   . Memory deficits 01/17/2014  . HOH (hard of hearing)    Past Surgical History  Procedure Laterality Date  . Appendectomy    . Tonsillectomy    . Cyst removed from urinary bladder    . Colonoscopy  02/2010    patchy erythema, minimal granularity in distal rectum, left-sided diverticula, ileal diverticulum.  Bx benign. next TCS 02/2012  . Esophagogastroduodenoscopy  04/2008    schatzki ring, s/p ED, hh  . Colonoscopy  03/24/2012    Dr. Gala Romney- inflammatory changes of the rectum and colon consistent with  history of U/C. bx= benign colonic mucosa  . Shoulder arthroscopy w/ rotator cuff repair Bilateral   . Lumbar fusion    . Colonoscopy N/A 10/05/2014    RMR: abnormal rectal and sigmoid mucosa as described above status post segmental biopsy. active colitis sigmoid colon.   Family History  Problem Relation Age of Onset  . Lung cancer Mother 58    deceased  . Colon cancer Neg Hx   . Other Brother     Posttraumatic stress disorder  . Cancer Brother     skin cancer  . Huntington's disease Daughter   . Cancer Sister     thyroid  . Arthritis Sister   . SIDS Brother    Social History   Occupational History  . retired Furniture conservator/restorer   . works as Administrator, [employer] at [employer]    Social History Main Topics  .  Smoking status: Former Smoker -- 0.25 packs/day    Types: Cigarettes    Quit date: 08/17/1988  . Smokeless tobacco: Never Used  . Alcohol Use: No  . Drug Use: No  . Sexual Activity: Not on file   Tobacco Counseling Counseling given: No   Activities of Daily Living In your present state of health, do you have any difficulty performing the following activities: 12/27/2014  Hearing? Y  Vision? N  Difficulty concentrating or making decisions? Y  Walking or climbing stairs? N  Dressing or bathing? N  Doing errands, shopping? N  Preparing Food and eating ? N  Using the Toilet? N  In the past six months, have you accidently leaked urine? Y  Do you have problems with loss of bowel control? N  Managing your Medications? N  Managing your Finances? N  Housekeeping or managing your Housekeeping? Y    Immunizations and Health Maintenance Immunization History  Administered Date(s) Administered  . Influenza,inj,Quad PF,36+ Mos 05/14/2014  . Pneumococcal Conjugate-13 10/25/2014   There are no preventive care reminders to display for this patient.  Patient Care Team: Chevis Pretty, FNP as PCP - General (Nurse Practitioner) Daneil Dolin, MD (Gastroenterology)      Assessment:   This is a routine wellness examination for Sergio Baker.   Hearing/Vision screen Wears glasses, no history of cataracts or glaucoma Uses hearing aids bilaterally   Dietary issues and exercise activities discussed: Current Exercise Habits:: The patient does not participate in regular exercise at present, Limited by:: neurologic condition(s)  Goals    None    Include mostly vegetables, fruit, and lean meats in diet   Depression Screen PHQ 2/9 Scores 12/27/2014 10/25/2014 04/26/2014 01/18/2014  PHQ - 2 Score 0 0 2 0  PHQ- 9 Score - - 2 -    Fall Risk Fall Risk  12/27/2014 10/25/2014 04/26/2014 01/18/2014 09/01/2013  Falls in the past year? No No Yes No No  Number falls in past yr: - - 2 or more - -  Risk  for fall due to : - - Impaired balance/gait;Impaired mobility Impaired balance/gait -   Patient states he has not fallen in the past year, but at times does feel unsteady on his feet.  Fall precautions discussed.  He has a cane, but does not use it.  Encouraged him to start using cane, use handrails when going up and down stairs and avoid using throw rugs in home.     Cognitive Function: MMSE - Mini Mental State Exam 12/27/2014  Orientation to time 4  Orientation to Place 5  Registration 3  Attention/ Calculation  4  Recall 3  Language- name 2 objects 2  Language- repeat 1  Language- follow 3 step command 3  Language- read & follow direction 1  Write a sentence 1  Copy design 1  Total score 28    Screening Tests Health Maintenance  Topic Date Due  . INFLUENZA VACCINE  03/18/2015  . PNA vac Low Risk Adult (2 of 2 - PPSV23) 10/25/2015  . TETANUS/TDAP  01/26/2018  . COLONOSCOPY  10/05/2024  . ZOSTAVAX  Addressed        Plan:     Start using cane, use handrails when going up and down stairs and avoid using throw rugs in home.  Include mostly vegetables, fruits and lean meats in diet     During the course of the visit Trusten was educated and counseled about the following appropriate screening and preventive services:   Vaccines to include Pneumoccal, Influenza Td, Zostavax- up to date on all but zostavax, he may get this in the future  Electrocardiogram done 10/25/14  Colorectal cancer screening- up to date  Cardiovascular disease screening-lipids screened 10/25/14  Diabetes screening- done 10/25/14  Nutrition counseling- discussed today  Prostate cancer screening- slightly elevated 01/2014, due next month- patient has appointment scheduled for 02/13/15, recommend follow up at that time   Patient Instructions (the written plan) were given to the patient.   Xuan Mateus M, RN   12/27/2014       I have reviewed and agree with the above AWV documentation.  Claretta Fraise, M.D.

## 2014-12-27 NOTE — Patient Instructions (Addendum)
Thank you for coming in for   Fall Prevention and Home Safety Falls cause injuries and can affect all age groups. It is possible to use preventive measures to significantly decrease the likelihood of falls. There are many simple measures which can make your home safer and prevent falls. OUTDOORS  Repair cracks and edges of walkways and driveways.  Remove high doorway thresholds.  Trim shrubbery on the main path into your home.  Have good outside lighting.  Clear walkways of tools, rocks, debris, and clutter.  Check that handrails are not broken and are securely fastened. Both sides of steps should have handrails.  Have leaves, snow, and ice cleared regularly.  Use sand or salt on walkways during winter months.  In the garage, clean up grease or oil spills. BATHROOM  Install night lights.  Install grab bars by the toilet and in the tub and shower.  Use non-skid mats or decals in the tub or shower.  Place a plastic non-slip stool in the shower to sit on, if needed.  Keep floors dry and clean up all water on the floor immediately.  Remove soap buildup in the tub or shower on a regular basis.  Secure bath mats with non-slip, double-sided rug tape.  Remove throw rugs and tripping hazards from the floors. BEDROOMS  Install night lights.  Make sure a bedside light is easy to reach.  Do not use oversized bedding.  Keep a telephone by your bedside.  Have a firm chair with side arms to use for getting dressed.  Remove throw rugs and tripping hazards from the floor. KITCHEN  Keep handles on pots and pans turned toward the center of the stove. Use back burners when possible.  Clean up spills quickly and allow time for drying.  Avoid walking on wet floors.  Avoid hot utensils and knives.  Position shelves so they are not too high or low.  Place commonly used objects within easy reach.  If necessary, use a sturdy step stool with a grab bar when reaching.  Keep  electrical cables out of the way.  Do not use floor polish or wax that makes floors slippery. If you must use wax, use non-skid floor wax.  Remove throw rugs and tripping hazards from the floor. STAIRWAYS  Never leave objects on stairs.  Place handrails on both sides of stairways and use them. Fix any loose handrails. Make sure handrails on both sides of the stairways are as long as the stairs.  Check carpeting to make sure it is firmly attached along stairs. Make repairs to worn or loose carpet promptly.  Avoid placing throw rugs at the top or bottom of stairways, or properly secure the rug with carpet tape to prevent slippage. Get rid of throw rugs, if possible.  Have an electrician put in a light switch at the top and bottom of the stairs. OTHER FALL PREVENTION TIPS  Wear low-heel or rubber-soled shoes that are supportive and fit well. Wear closed toe shoes.  When using a stepladder, make sure it is fully opened and both spreaders are firmly locked. Do not climb a closed stepladder.  Add color or contrast paint or tape to grab bars and handrails in your home. Place contrasting color strips on first and last steps.  Learn and use mobility aids as needed. Install an electrical emergency response system.  Turn on lights to avoid dark areas. Replace light bulbs that burn out immediately. Get light switches that glow.  Arrange furniture to create  clear pathways. Keep furniture in the same place.  Firmly attach carpet with non-skid or double-sided tape.  Eliminate uneven floor surfaces.  Select a carpet pattern that does not visually hide the edge of steps.  Be aware of all pets. OTHER HOME SAFETY TIPS  Set the water temperature for 120 F (48.8 C).  Keep emergency numbers on or near the telephone.  Keep smoke detectors on every level of the home and near sleeping areas. Document Released: 07/24/2002 Document Revised: 02/02/2012 Document Reviewed: 10/23/2011 [facility]  Patient Information 2015 [place], [place]. This information is not intended to replace advice given to you by your health care provider. Make sure you discuss any questions you have with your health care provider.   Preventive Care for Adults A healthy lifestyle and preventive care can promote health and wellness. Preventive health guidelines for men include the following key practices:  A routine yearly physical is a good way to check with your health care provider about your health and preventative screening. It is a chance to share any concerns and updates on your health and to receive a thorough exam.  Visit your dentist for a routine exam and preventative care every 6 months. Brush your teeth twice a day and floss once a day. Good oral hygiene prevents tooth decay and gum disease.  The frequency of eye exams is based on your age, health, family medical history, use of contact lenses, and other factors. Follow your health care provider's recommendations for frequency of eye exams.  Eat a healthy diet. Foods such as vegetables, fruits, whole grains, low-fat dairy products, and lean protein foods contain the nutrients you need without too many calories. Decrease your intake of foods high in solid fats, added sugars, and salt. Eat the right amount of calories for you.Get information about a proper diet from your health care provider, if necessary.  Regular physical exercise is one of the most important things you can do for your health. Most adults should get at least 150 minutes of moderate-intensity exercise (any activity that increases your heart rate and causes you to sweat) each week. In addition, most adults need muscle-strengthening exercises on 2 or more days a week.  Maintain a healthy weight. The body mass index (BMI) is a screening tool to identify possible weight problems. It provides an estimate of body fat based on height and weight. Your health care provider can find your BMI and can  help you achieve or maintain a healthy weight.For adults 20 years and older:  A BMI below 18.5 is considered underweight.  A BMI of 18.5 to 24.9 is normal.  A BMI of 25 to 29.9 is considered overweight.  A BMI of 30 and above is considered obese.  Maintain normal blood lipids and cholesterol levels by exercising and minimizing your intake of saturated fat. Eat a balanced diet with plenty of fruit and vegetables. Blood tests for lipids and cholesterol should begin at age 49 and be repeated every 5 years. If your lipid or cholesterol levels are high, you are over 50, or you are at high risk for heart disease, you may need your cholesterol levels checked more frequently.Ongoing high lipid and cholesterol levels should be treated with medicines if diet and exercise are not working.  If you smoke, find out from your health care provider how to quit. If you do not use tobacco, do not start.  Lung cancer screening is recommended for adults aged 40-80 years who are at high risk for developing  lung cancer because of a history of smoking. A yearly low-dose CT scan of the lungs is recommended for people who have at least a 30-pack-year history of smoking and are a current smoker or have quit within the past 15 years. A pack year of smoking is smoking an average of 1 pack of cigarettes a day for 1 year (for example: 1 pack a day for 30 years or 2 packs a day for 15 years). Yearly screening should continue until the smoker has stopped smoking for at least 15 years. Yearly screening should be stopped for people who develop a health problem that would prevent them from having lung cancer treatment.  If you choose to drink alcohol, do not have more than 2 drinks per day. One drink is considered to be 12 ounces (355 mL) of beer, 5 ounces (148 mL) of wine, or 1.5 ounces (44 mL) of liquor.  Avoid use of street drugs. Do not share needles with anyone. Ask for help if you need support or instructions about stopping  the use of drugs.  High blood pressure causes heart disease and increases the risk of stroke. Your blood pressure should be checked at least every 1-2 years. Ongoing high blood pressure should be treated with medicines, if weight loss and exercise are not effective.  If you are 73-33 years old, ask your health care provider if you should take aspirin to prevent heart disease.  Diabetes screening involves taking a blood sample to check your fasting blood sugar level. This should be done once every 3 years, after age 21, if you are within normal weight and without risk factors for diabetes. Testing should be considered at a younger age or be carried out more frequently if you are overweight and have at least 1 risk factor for diabetes.  Colorectal cancer can be detected and often prevented. Most routine colorectal cancer screening begins at the age of 76 and continues through age 34. However, your health care provider may recommend screening at an earlier age if you have risk factors for colon cancer. On a yearly basis, your health care provider may provide home test kits to check for hidden blood in the stool. Use of a small camera at the end of a tube to directly examine the colon (sigmoidoscopy or colonoscopy) can detect the earliest forms of colorectal cancer. Talk to your health care provider about this at age 27, when routine screening begins. Direct exam of the colon should be repeated every 5-10 years through age 86, unless early forms of precancerous polyps or small growths are found.  People who are at an increased risk for hepatitis B should be screened for this virus. You are considered at high risk for hepatitis B if:  You were born in a country where hepatitis B occurs often. Talk with your health care provider about which countries are considered high risk.  Your parents were born in a high-risk country and you have not received a shot to protect against hepatitis B (hepatitis B  vaccine).  You have HIV or AIDS.  You use needles to inject street drugs.  You live with, or have sex with, someone who has hepatitis B.  You are a man who has sex with other men (MSM).  You get hemodialysis treatment.  You take certain medicines for conditions such as cancer, organ transplantation, and autoimmune conditions.  Hepatitis C blood testing is recommended for all people born from [birth year] through 1965 and any individual with known risks  for hepatitis C.  Practice safe sex. Use condoms and avoid high-risk sexual practices to reduce the spread of sexually transmitted infections (STIs). STIs include gonorrhea, chlamydia, syphilis, trichomonas, herpes, HPV, and human immunodeficiency virus (HIV). Herpes, HIV, and HPV are viral illnesses that have no cure. They can result in disability, cancer, and death.  If you are at risk of being infected with HIV, it is recommended that you take a prescription medicine daily to prevent HIV infection. This is called preexposure prophylaxis (PrEP). You are considered at risk if:  You are a man who has sex with other men (MSM) and have other risk factors.  You are a heterosexual man, are sexually active, and are at increased risk for HIV infection.  You take drugs by injection.  You are sexually active with a partner who has HIV.  Talk with your health care provider about whether you are at high risk of being infected with HIV. If you choose to begin PrEP, you should first be tested for HIV. You should then be tested every 3 months for as long as you are taking PrEP.  A one-time screening for abdominal aortic aneurysm (AAA) and surgical repair of large AAAs by ultrasound are recommended for men ages 27 to 64 years who are current or former smokers.  Healthy men should no longer receive prostate-specific antigen (PSA) blood tests as part of routine cancer screening. Talk with your health care provider about prostate cancer screening.  Testicular  cancer screening is not recommended for adult males who have no symptoms. Screening includes self-exam, a health care provider exam, and other screening tests. Consult with your health care provider about any symptoms you have or any concerns you have about testicular cancer.  Use sunscreen. Apply sunscreen liberally and repeatedly throughout the day. You should seek shade when your shadow is shorter than you. Protect yourself by wearing long sleeves, pants, a wide-brimmed hat, and sunglasses year round, whenever you are outdoors.  Once a month, do a whole-body skin exam, using a mirror to look at the skin on your back. Tell your health care provider about new moles, moles that have irregular borders, moles that are larger than a pencil eraser, or moles that have changed in shape or color.  Stay current with required vaccines (immunizations).  Influenza vaccine. All adults should be immunized every year.  Tetanus, diphtheria, and acellular pertussis (Td, Tdap) vaccine. An adult who has not previously received Tdap or who does not know his vaccine status should receive 1 dose of Tdap. This initial dose should be followed by tetanus and diphtheria toxoids (Td) booster doses every 10 years. Adults with an unknown or incomplete history of completing a 3-dose immunization series with Td-containing vaccines should begin or complete a primary immunization series including a Tdap dose. Adults should receive a Td booster every 10 years.  Varicella vaccine. An adult without evidence of immunity to varicella should receive 2 doses or a second dose if he has previously received 1 dose.  Human papillomavirus (HPV) vaccine. Males aged 61-21 years who have not received the vaccine previously should receive the 3-dose series. Males aged 22-26 years may be immunized. Immunization is recommended through the age of 37 years for any male who has sex with males and did not get any or all doses earlier. Immunization is  recommended for any person with an immunocompromised condition through the age of 32 years if he did not get any or all doses earlier. During the 3-dose series,  the second dose should be obtained 4-8 weeks after the first dose. The third dose should be obtained 24 weeks after the first dose and 16 weeks after the second dose.  Zoster vaccine. One dose is recommended for adults aged 39 years or older unless certain conditions are present.  Measles, mumps, and rubella (MMR) vaccine. Adults born before [birth year] generally are considered immune to measles and mumps. Adults born in [birth year] or later should have 1 or more doses of MMR vaccine unless there is a contraindication to the vaccine or there is laboratory evidence of immunity to each of the three diseases. A routine second dose of MMR vaccine should be obtained at least 28 days after the first dose for students attending postsecondary schools, health care workers, or international travelers. People who received inactivated measles vaccine or an unknown type of measles vaccine during 1963-1967 should receive 2 doses of MMR vaccine. People who received inactivated mumps vaccine or an unknown type of mumps vaccine before 1979 and are at high risk for mumps infection should consider immunization with 2 doses of MMR vaccine. Unvaccinated health care workers born before [birth year] who lack laboratory evidence of measles, mumps, or rubella immunity or laboratory confirmation of disease should consider measles and mumps immunization with 2 doses of MMR vaccine or rubella immunization with 1 dose of MMR vaccine.  Pneumococcal 13-valent conjugate (PCV13) vaccine. When indicated, a person who is uncertain of his immunization history and has no record of immunization should receive the PCV13 vaccine. An adult aged 54 years or older who has certain medical conditions and has not been previously immunized should receive 1 dose of PCV13 vaccine. This PCV13 should be followed with a dose  of pneumococcal polysaccharide (PPSV23) vaccine. The PPSV23 vaccine dose should be obtained at least 8 weeks after the dose of PCV13 vaccine. An adult aged 50 years or older who has certain medical conditions and previously received 1 or more doses of PPSV23 vaccine should receive 1 dose of PCV13. The PCV13 vaccine dose should be obtained 1 or more years after the last PPSV23 vaccine dose.  Pneumococcal polysaccharide (PPSV23) vaccine. When PCV13 is also indicated, PCV13 should be obtained first. All adults aged 63 years and older should be immunized. An adult younger than age 37 years who has certain medical conditions should be immunized. Any person who resides in a nursing home or long-term care facility should be immunized. An adult smoker should be immunized. People with an immunocompromised condition and certain other conditions should receive both PCV13 and PPSV23 vaccines. People with human immunodeficiency virus (HIV) infection should be immunized as soon as possible after diagnosis. Immunization during chemotherapy or radiation therapy should be avoided. Routine use of PPSV23 vaccine is not recommended for American Indians, [place] Natives, or people younger than 65 years unless there are medical conditions that require PPSV23 vaccine. When indicated, people who have unknown immunization and have no record of immunization should receive PPSV23 vaccine. One-time revaccination 5 years after the first dose of PPSV23 is recommended for people aged 19-64 years who have chronic kidney failure, nephrotic syndrome, asplenia, or immunocompromised conditions. People who received 1-2 doses of PPSV23 before age 30 years should receive another dose of PPSV23 vaccine at age 57 years or later if at least 5 years have passed since the previous dose. Doses of PPSV23 are not needed for people immunized with PPSV23 at or after age 44 years.  Meningococcal vaccine. Adults with asplenia or persistent complement component  deficiencies should  receive 2 doses of quadrivalent meningococcal conjugate (MenACWY-D) vaccine. The doses should be obtained at least 2 months apart. Microbiologists working with certain meningococcal bacteria, [employer] recruits, people at risk during an outbreak, and people who travel to or live in countries with a high rate of meningitis should be immunized. A first-year college student up through age 56 years who is living in a residence hall should receive a dose if he did not receive a dose on or after his 16th birthday. Adults who have certain high-risk conditions should receive one or more doses of vaccine.  Hepatitis A vaccine. Adults who wish to be protected from this disease, have certain high-risk conditions, work with hepatitis A-infected animals, work in hepatitis A research labs, or travel to or work in countries with a high rate of hepatitis A should be immunized. Adults who were previously unvaccinated and who anticipate close contact with an international adoptee during the first 60 days after arrival in the [place] from a country with a high rate of hepatitis A should be immunized.  Hepatitis B vaccine. Adults should be immunized if they wish to be protected from this disease, have certain high-risk conditions, may be exposed to blood or other infectious body fluids, are household contacts or sex partners of hepatitis B positive people, are clients or workers in certain care facilities, or travel to or work in countries with a high rate of hepatitis B.  Haemophilus influenzae type b (Hib) vaccine. A previously unvaccinated person with asplenia or sickle cell disease or having a scheduled splenectomy should receive 1 dose of Hib vaccine. Regardless of previous immunization, a recipient of a hematopoietic stem cell transplant should receive a 3-dose series 6-12 months after his successful transplant. Hib vaccine is not recommended for adults with HIV infection. Preventive Service /  Frequency Ages 35 to 82  Blood pressure check.** / Every 1 to 2 years.  Lipid and cholesterol check.** / Every 5 years beginning at age 25.  Hepatitis C blood test.** / For any individual with known risks for hepatitis C.  Skin self-exam. / Monthly.  Influenza vaccine. / Every year.  Tetanus, diphtheria, and acellular pertussis (Tdap, Td) vaccine.** / Consult your health care provider. 1 dose of Td every 10 years.  Varicella vaccine.** / Consult your health care provider.  HPV vaccine. / 3 doses over 6 months, if 53 or younger.  Measles, mumps, rubella (MMR) vaccine.** / You need at least 1 dose of MMR if you were born in 1957 or later. You may also need a second dose.  Pneumococcal 13-valent conjugate (PCV13) vaccine.** / Consult your health care provider.  Pneumococcal polysaccharide (PPSV23) vaccine.** / 1 to 2 doses if you smoke cigarettes or if you have certain conditions.  Meningococcal vaccine.** / 1 dose if you are age 74 to 29 years and a Market researcher living in a residence hall, or have one of several medical conditions. You may also need additional booster doses.  Hepatitis A vaccine.** / Consult your health care provider.  Hepatitis B vaccine.** / Consult your health care provider.  Haemophilus influenzae type b (Hib) vaccine.** / Consult your health care provider. Ages 24 to 77  Blood pressure check.** / Every 1 to 2 years.  Lipid and cholesterol check.** / Every 5 years beginning at age 29.  Lung cancer screening. / Every year if you are aged 5-80 years and have a 30-pack-year history of smoking and currently smoke or have quit within the past 15 years.  Yearly screening is stopped once you have quit smoking for at least 15 years or develop a health problem that would prevent you from having lung cancer treatment.  Fecal occult blood test (FOBT) of stool. / Every year beginning at age 55 and continuing until age 49. You may not have to do this test  if you get a colonoscopy every 10 years.  Flexible sigmoidoscopy** or colonoscopy.** / Every 5 years for a flexible sigmoidoscopy or every 10 years for a colonoscopy beginning at age 74 and continuing until age 53.  Hepatitis C blood test.** / For all people born from [birth year] through 1963 and any individual with known risks for hepatitis C.  Skin self-exam. / Monthly.  Influenza vaccine. / Every year.  Tetanus, diphtheria, and acellular pertussis (Tdap/Td) vaccine.** / Consult your health care provider. 1 dose of Td every 10 years.  Varicella vaccine.** / Consult your health care provider.  Zoster vaccine.** / 1 dose for adults aged 58 years or older.  Measles, mumps, rubella (MMR) vaccine.** / You need at least 1 dose of MMR if you were born in 1955 or later. You may also need a second dose.  Pneumococcal 13-valent conjugate (PCV13) vaccine.** / Consult your health care provider.  Pneumococcal polysaccharide (PPSV23) vaccine.** / 1 to 2 doses if you smoke cigarettes or if you have certain conditions.  Meningococcal vaccine.** / Consult your health care provider.  Hepatitis A vaccine.** / Consult your health care provider.  Hepatitis B vaccine.** / Consult your health care provider.  Haemophilus influenzae type b (Hib) vaccine.** / Consult your health care provider. Ages 81 and over  Blood pressure check.** / Every 1 to 2 years.  Lipid and cholesterol check.**/ Every 5 years beginning at age 65.  Lung cancer screening. / Every year if you are aged 21-80 years and have a 30-pack-year history of smoking and currently smoke or have quit within the past 15 years. Yearly screening is stopped once you have quit smoking for at least 15 years or develop a health problem that would prevent you from having lung cancer treatment.  Fecal occult blood test (FOBT) of stool. / Every year beginning at age 64 and continuing until age 96. You may not have to do this test if you get a colonoscopy  every 10 years.  Flexible sigmoidoscopy** or colonoscopy.** / Every 5 years for a flexible sigmoidoscopy or every 10 years for a colonoscopy beginning at age 13 and continuing until age 38.  Hepatitis C blood test.** / For all people born from [birth year] through 1963 and any individual with known risks for hepatitis C.  Abdominal aortic aneurysm (AAA) screening.** / A one-time screening for ages 5 to 57 years who are current or former smokers.  Skin self-exam. / Monthly.  Influenza vaccine. / Every year.  Tetanus, diphtheria, and acellular pertussis (Tdap/Td) vaccine.** / 1 dose of Td every 10 years.  Varicella vaccine.** / Consult your health care provider.  Zoster vaccine.** / 1 dose for adults aged 85 years or older.  Pneumococcal 13-valent conjugate (PCV13) vaccine.** / Consult your health care provider.  Pneumococcal polysaccharide (PPSV23) vaccine.** / 1 dose for all adults aged 10 years and older.  Meningococcal vaccine.** / Consult your health care provider.  Hepatitis A vaccine.** / Consult your health care provider.  Hepatitis B vaccine.** / Consult your health care provider.  Haemophilus influenzae type b (Hib) vaccine.** / Consult your health care provider. **Family history and personal history of risk and conditions may  change your health care provider's recommendations. Document Released: 09/29/2001 Document Revised: 08/08/2013 Document Reviewed: 12/29/2010 [facility] Patient Information 2015 [place], [place]. This information is not intended to replace advice given to you by your health care provider. Make sure you discuss any questions you have with your health care provider.

## 2015-01-03 ENCOUNTER — Telehealth: Payer: Self-pay

## 2015-01-03 ENCOUNTER — Other Ambulatory Visit: Payer: Self-pay

## 2015-01-03 MED ORDER — BUDESONIDE 9 MG PO TB24: 9.0000 mg | ORAL_TABLET | Freq: Every day | ORAL | Status: DC

## 2015-01-03 NOTE — Telephone Encounter (Signed)
Pt wife is aware of new medication. Pt has ov on 02/11/2015 @ 4:00pm with RMR

## 2015-01-03 NOTE — Telephone Encounter (Signed)
Let's make sure he is taking Lialda FOUR each morning.  If he is, then we can ADD Uceris 23m daily for 8 weeks.  Please make appt with RMR only in next 4-6 weeks.

## 2015-01-03 NOTE — Telephone Encounter (Signed)
Pt wife(Linda)  called states that when pt seen LSL that she started him on  lialda and to call back to let us know if it works.  Wife states that is not helping. Pt is still having loose stools daily. States 3 or sometimes more a day. Please advise. Wife states she thinks pt needs to see Dr. Gala Romney

## 2015-01-04 NOTE — Telephone Encounter (Signed)
Tried to do a PA for [employer]. PA was denied. Reason was pt has not met the step therapy requirements. He has to try and fail sulfasalazine. We can do an appeal. If you want to do this, I will need a letter from you.

## 2015-01-04 NOTE — Telephone Encounter (Signed)
Can we provide him with samples while we appeal it?

## 2015-01-07 NOTE — Telephone Encounter (Signed)
We have 2 packets which is enough for 4 days.

## 2015-01-07 NOTE — Telephone Encounter (Signed)
PA letter has been faxed to the insurance company.

## 2015-01-07 NOTE — Telephone Encounter (Signed)
Sergio Baker, the appeal letter has been done. Please forward to the appropriate party. Thanks!

## 2015-01-17 ENCOUNTER — Telehealth: Payer: Self-pay

## 2015-01-17 NOTE — Telephone Encounter (Signed)
Pt's wife called to inform us that the new medication because it is going to cost them $500.00. They are wanting to know what elias he can take. Please advise

## 2015-01-17 NOTE — Telephone Encounter (Signed)
Received a letter from [employer] today. Appeal has been approved. Pt is aware. Letter has been faxed to the pharmacy.

## 2015-01-17 NOTE — Telephone Encounter (Signed)
Please verify that he is talking about Uceris. Appeals was approved, though it was covered now unless he has a high co-pay. Find out how many stools he is having daily, are they loose or bloody, any abdominal pain? Is he still on Lialda four daily?

## 2015-01-18 NOTE — Telephone Encounter (Signed)
Pt stated that his co-pay for the Uceris was to high. He is having about 3-4 bowel movements a day. They are loose at times but no blood. No abd pains. He is taking the Lialda 4 pills once a day. Please advise

## 2015-01-22 NOTE — Telephone Encounter (Signed)
Just continue the Lialda 4 pills daily. Keep OV later this month as planned. If he notices blood, has abdominal pain, increased number of stools, then we could consider prednisone.

## 2015-01-22 NOTE — Telephone Encounter (Signed)
Pts wife is aware 

## 2015-02-11 ENCOUNTER — Ambulatory Visit: Payer: Medicare Other | Admitting: Nurse Practitioner

## 2015-02-11 ENCOUNTER — Ambulatory Visit: Payer: Medicare Other | Admitting: Adult Health

## 2015-02-11 ENCOUNTER — Encounter: Payer: Self-pay | Admitting: Internal Medicine

## 2015-02-11 ENCOUNTER — Ambulatory Visit (INDEPENDENT_AMBULATORY_CARE_PROVIDER_SITE_OTHER): Payer: Medicare Other | Admitting: Internal Medicine

## 2015-02-11 VITALS — BP 145/78 | HR 78 | Temp 97.8°F | Ht 65.0 in | Wt 172.0 lb

## 2015-02-11 DIAGNOSIS — K5190 Ulcerative colitis, unspecified, without complications: Secondary | ICD-10-CM | POA: Diagnosis not present

## 2015-02-11 DIAGNOSIS — K219 Gastro-esophageal reflux disease without esophagitis: Secondary | ICD-10-CM | POA: Diagnosis not present

## 2015-02-11 NOTE — Patient Instructions (Addendum)
Continue Lialda 4.8 grams daily  Avoid non-steroidal medications  Continue omeprazole 20 mg daily  Office visit in 9 months (will check renal function at next )

## 2015-02-11 NOTE — Progress Notes (Signed)
Primary Care Physician:  Chevis Pretty, FNP Primary Gastroenterologist:  Dr. Gala Romney  Pre-Procedure History & Physical: HPI:  Sergio Baker is a 80 y.o. male here for proctocolitis. Recent colonoscopy demonstrated mildly active disease  -  distal colon and rectum.  Clinically, doing well on Lialda 4.8 g daily. Has 3 bowel movements in the morning usually. [place] 4-6. Is not having any tenesmus or bleeding. Usually no more than 3 BMs daily Denies abdominal pain. He is pleased with his current status as far as inflammatory bowel disease concerned. He is very busy taking care of his wife who has multiple medical problems these days. He's no longer playing [place] at [place] time due to possibilities at home.  GERD continues to do well on omeprazole 20 mg daily. No dysphagia, early satiety or weight loss.  Past Medical History  Diagnosis Date  . Proctocolitis, ulcerative     Diagnosed in the 1980s.  Marland Kitchen GERD (gastroesophageal reflux disease)   . A-fib   . Hemorrhoids   . Diverticula of colon   . Hiatal hernia     small  . Schatzki's ring   . Ulcerative colitis     left side  . Huntington's disease 2010  . Depression   . Arthritis   . Enlarged prostate   . Joint pain   . Hearing loss   . Abnormality of gait   . Hyperlipidemia   . Memory deficits 01/17/2014  . HOH (hard of hearing)     Past Surgical History  Procedure Laterality Date  . Appendectomy    . Tonsillectomy    . Cyst removed from urinary bladder    . Colonoscopy  02/2010    patchy erythema, minimal granularity in distal rectum, left-sided diverticula, ileal diverticulum.  Bx benign. next TCS 02/2012  . Esophagogastroduodenoscopy  04/2008    schatzki ring, s/p ED, hh  . Colonoscopy  03/24/2012    Dr. Gala Romney- inflammatory changes of the rectum and colon consistent with history of U/C. bx= benign colonic mucosa  . Shoulder arthroscopy w/ rotator cuff repair Bilateral   . Lumbar fusion    . Colonoscopy N/A  10/05/2014    RMR: abnormal rectal and sigmoid mucosa as described above status post segmental biopsy. active colitis sigmoid colon.    Prior to Admission medications   Medication Sig Start Date End Date Taking? Authorizing Provider  aspirin EC 81 MG tablet Take 81 mg by mouth daily.   Yes Historical Provider, MD  atorvastatin (LIPITOR) 40 MG tablet Take 1 tablet (40 mg total) by mouth daily. 10/26/14  Yes Mary-Margaret Hassell Done, FNP  Bee Pollen 550 MG CAPS Take 1 tablet by mouth.   Yes Historical Provider, MD  Calcium Carbonate-Vitamin D (CALCIUM 600/VITAMIN D PO) Take 1 tablet by mouth daily.   Yes Historical Provider, MD  Cholecalciferol (VITAMIN D3) 2000 UNITS capsule Take 2,000 Units by mouth daily.   Yes Historical Provider, MD  citalopram (CELEXA) 20 MG tablet TAKE ONE (1) TABLET EACH DAY 10/25/14  Yes Mary-Margaret Hassell Done, FNP  fenofibrate 160 MG tablet TAKE ONE (1) TABLET EACH DAY 10/25/14  Yes Mary-Margaret Hassell Done, FNP  glucosamine-chondroitin 500-400 MG tablet Take 1 tablet by mouth daily.   Yes Historical Provider, MD  haloperidol (HALDOL) 1 MG tablet Take 1.5 tablets (1.5 mg total) by mouth 2 (two) times daily. 10/27/14  Yes Kathrynn Ducking, MD  levothyroxine (SYNTHROID, LEVOTHROID) 50 MCG tablet TAKE ONE (1) TABLET EACH DAY 06/11/14  Yes Estella Husk  Laurance Flatten, MD  mesalamine (LIALDA) 1.2 G EC tablet Take 4 tablets (4.8 g total) by mouth daily with breakfast. 11/21/14  Yes Mahala Menghini, PA-C  Multiple Vitamin (MULTIVITAMIN WITH MINERALS) TABS Take 1 tablet by mouth daily.   Yes Historical Provider, MD  Omega-3 Fatty Acids 1200 MG CAPS Take 1,200 capsules by mouth daily.   Yes Historical Provider, MD  omeprazole (PRILOSEC) 20 MG capsule TAKE ONE (1) CAPSULE EACH DAY 10/25/14  Yes Mary-Margaret Hassell Done, FNP  Budesonide (UCERIS) 9 MG TB24 Take 9 mg by mouth daily. Patient not taking: Reported on 02/11/2015 01/03/15   Mahala Menghini, PA-C    Allergies as of 02/11/2015  . (No Known Allergies)     Family History  Problem Relation Age of Onset  . Lung cancer Mother 54    deceased  . Colon cancer Neg Hx   . Other Brother     Posttraumatic stress disorder  . Cancer Brother     skin cancer  . Huntington's disease Daughter   . Cancer Sister     thyroid  . Arthritis Sister   . SIDS Brother     History   Social History  . Marital Status: Married    Spouse Name: [name]  . Number of Children: 1  . Years of Education: 12th   Occupational History  . retired Furniture conservator/restorer   . works as Administrator, arts at [employer]    Social History Main Topics  . Smoking status: Former Smoker -- 0.25 packs/day    Types: Cigarettes    Quit date: 08/17/1988  . Smokeless tobacco: Never Used  . Alcohol Use: No  . Drug Use: No  . Sexual Activity: Not on file   Other Topics Concern  . Not on file   Social History Narrative   Patient lives at home with spouse.   Caffeine Use: 1 cup weekly    Review of Systems: See HPI, otherwise negative ROS  Physical Exam: BP 145/78 mmHg  Pulse 78  Temp(Src) 97.8 F (36.6 C) (Oral)  Ht 5' 5"  (1.651 m)  Wt 172 lb (78.019 kg)  BMI 28.62 kg/m2 General:   Alert,  Well-developed, well-nourished, pleasant and cooperative in NAD Skin:  Intact without significant lesions or rashes. Eyes:  Sclera clear, no icterus.   Conjunctiva pink. Ears:  Normal auditory acuity. Nose:  No deformity, discharge,  or lesions. Mouth:  No deformity or lesions. Neck:  Supple; no masses or thyromegaly. No significant cervical adenopathy. Lungs:  Clear throughout to auscultation.   No wheezes, crackles, or rhonchi. No acute distress. Heart:  Regular rate and rhythm; no murmurs, clicks, rubs,  or gallops. Abdomen: Non-distended, normal bowel sounds.  Soft and nontender without appreciable mass or hepatosplenomegaly.  Pulses:  Normal pulses noted. Extremities:  Without clubbing or edema.  Impression:   Pleasant 80 year old gentleman with long-standing left-sided proctocolitis.  Recent colonoscopy reassuring. Clinically, he is pretty much in a remission.  As a separate issue, GERD symptoms well controlled on omeprazole 20 mg daily.  Recommendations:     Continue Lialda 4.8 grams daily.  Avoid non-steroidal medications.  Continue omeprazole 20 mg daily .  Office visit in 9 months (will check renal function at next visit)     Notice: This dictation was prepared with Dragon dictation along with smaller phrase technology. Any transcriptional errors that result from this process are unintentional and may not be corrected upon review.

## 2015-02-13 ENCOUNTER — Encounter: Payer: Self-pay | Admitting: Nurse Practitioner

## 2015-02-13 ENCOUNTER — Ambulatory Visit (INDEPENDENT_AMBULATORY_CARE_PROVIDER_SITE_OTHER): Payer: Medicare Other | Admitting: Nurse Practitioner

## 2015-02-13 VITALS — BP 148/88 | HR 71 | Temp 96.9°F | Ht 65.0 in | Wt 170.4 lb

## 2015-02-13 DIAGNOSIS — E038 Other specified hypothyroidism: Secondary | ICD-10-CM

## 2015-02-13 DIAGNOSIS — F32A Depression, unspecified: Secondary | ICD-10-CM

## 2015-02-13 DIAGNOSIS — E785 Hyperlipidemia, unspecified: Secondary | ICD-10-CM

## 2015-02-13 DIAGNOSIS — E034 Atrophy of thyroid (acquired): Secondary | ICD-10-CM | POA: Diagnosis not present

## 2015-02-13 DIAGNOSIS — K219 Gastro-esophageal reflux disease without esophagitis: Secondary | ICD-10-CM

## 2015-02-13 DIAGNOSIS — G10 Huntington's disease: Secondary | ICD-10-CM | POA: Diagnosis not present

## 2015-02-13 DIAGNOSIS — F329 Major depressive disorder, single episode, unspecified: Secondary | ICD-10-CM

## 2015-02-13 DIAGNOSIS — I482 Chronic atrial fibrillation, unspecified: Secondary | ICD-10-CM

## 2015-02-13 DIAGNOSIS — K5190 Ulcerative colitis, unspecified, without complications: Secondary | ICD-10-CM

## 2015-02-13 NOTE — Progress Notes (Signed)
Subjective:    Patient ID: Sergio Baker, male    DOB: 31-Jan-1935, 80 y.o.   MRN: [id number]  Atrial Fibrillation Presents for follow-up visit. Symptoms are negative for chest pain, shortness of breath and weakness. Past medical history includes atrial fibrillation and hyperlipidemia.  Hyperlipidemia Pertinent negatives include no chest pain or shortness of breath.   Patient in today for follow up of Chronic medical problems. He had a colonoscopy 1 month ago and was restarted on pentasa which he had not been taking for a while due to availability. He reports seeing some improvement with his bowel patterns and character with this.  Huntington's No much change since last visit-Was encouraged to use a cane as gait has been more unsteady but has not felt the need to use it as he has not fallen.  Hypothyroidism  Currnetly on levothyroxin 50 mcg- doing well- no c/o fatigue Depression Currently on celexa which he says is working well without side effects. Hyperlipidemia  Patient has had for >10 years. He is currently taking crestor without any problems. He is trying to watch diet. GERD Omeprazole daily keeps symptoms under control Ulcerative colitis Has had recent flare up and was put on lialda which is helping   Review of Systems  Constitutional: Negative.   HENT: Negative.   Respiratory: Negative.  Negative for chest tightness and shortness of breath.   Cardiovascular: Negative.  Negative for chest pain.  Gastrointestinal: Positive for diarrhea and blood in stool (Reports intermittent pink blood in stool as well as mucous, reports has improved some with pentasa.). Negative for nausea and abdominal pain.  Genitourinary: Negative.   Musculoskeletal: Positive for arthralgias and gait problem (Some unsteadiness, denies falls. ). Negative for joint swelling and neck stiffness.  Neurological: Negative.  Negative for tremors, syncope and weakness.  Psychiatric/Behavioral: Negative.   All  other systems reviewed and are negative.      Objective:   Physical Exam  Constitutional: He is oriented to person, place, and time. He appears well-developed and well-nourished.  HENT:  Head: Normocephalic.  Right Ear: External ear normal.  Left Ear: External ear normal.  Nose: Nose normal.  Mouth/Throat: Oropharynx is clear and moist.  Eyes: EOM are normal. Pupils are equal, round, and reactive to light.  Neck: Normal range of motion. Neck supple. No JVD present. No thyromegaly present.  Cardiovascular: Normal rate, normal heart sounds and intact distal pulses.  Exam reveals no gallop and no friction rub.   No murmur heard. Irregular heart rate   Pulmonary/Chest: Effort normal and breath sounds normal. No respiratory distress. He has no wheezes. He has no rales. He exhibits no tenderness.  Abdominal: Soft. Bowel sounds are normal. He exhibits no mass. There is no tenderness.  Genitourinary: Prostate normal and penis normal.  Musculoskeletal: Normal range of motion. He exhibits no edema.  Constant jerking movements. Gait slightly unsteady.  Lymphadenopathy:    He has no cervical adenopathy.  Neurological: He is alert and oriented to person, place, and time. No cranial nerve deficit. Abnormal gait: Unsteady gait.   Skin: Skin is warm and dry.  Psychiatric: He has a normal mood and affect. His speech is normal and behavior is normal. Judgment and thought content normal.  Vitals reviewed.  BP 148/88 mmHg  Pulse 71  Temp(Src) 96.9 F (36.1 C) (Oral)  Ht 5' 5"  (1.651 m)  Wt 170 lb 6.4 oz (77.293 kg)  BMI 28.36 kg/m2        Assessment & Plan:

## 2015-02-13 NOTE — Patient Instructions (Signed)

## 2015-02-14 ENCOUNTER — Telehealth: Payer: Self-pay | Admitting: *Deleted

## 2015-02-14 LAB — LIPID PANEL
CHOL/HDL RATIO: 6 ratio — AB (ref 0.0–5.0)
Cholesterol, Total: 222 mg/dL — ABNORMAL HIGH (ref 100–199)
HDL: 37 mg/dL — ABNORMAL LOW (ref >39)
LDL Calculated: 146 mg/dL — ABNORMAL HIGH (ref 0–99)
Triglycerides: 194 mg/dL — ABNORMAL HIGH (ref 0–149)
VLDL CHOLESTEROL CAL: 39 mg/dL (ref 5–40)

## 2015-02-14 LAB — CMP14+EGFR
ALBUMIN: 4.6 g/dL (ref 3.5–4.8)
ALT: 19 IU/L (ref 0–44)
AST: 27 IU/L (ref 0–40)
Albumin/Globulin Ratio: 1.6 (ref 1.1–2.5)
Alkaline Phosphatase: 66 IU/L (ref 39–117)
BUN/Creatinine Ratio: 23 — ABNORMAL HIGH (ref 10–22)
BUN: 18 mg/dL (ref 8–27)
Bilirubin Total: 0.6 mg/dL (ref 0.0–1.2)
CHLORIDE: 103 mmol/L (ref 97–108)
CO2: 24 mmol/L (ref 18–29)
CREATININE: 0.79 mg/dL (ref 0.76–1.27)
Calcium: 9.5 mg/dL (ref 8.6–10.2)
GFR calc Af Amer: 99 mL/min/{1.73_m2} (ref >59)
GFR, EST NON AFRICAN AMERICAN: 86 mL/min/{1.73_m2} (ref >59)
Globulin, Total: 2.8 g/dL (ref 1.5–4.5)
Glucose: 110 mg/dL — ABNORMAL HIGH (ref 65–99)
Potassium: 4.4 mmol/L (ref 3.5–5.2)
SODIUM: 143 mmol/L (ref 134–144)
TOTAL PROTEIN: 7.4 g/dL (ref 6.0–8.5)

## 2015-02-14 LAB — THYROID PANEL WITH TSH
Free Thyroxine Index: 1.8 (ref 1.2–4.9)
T3 UPTAKE RATIO: 30 % (ref 24–39)
T4, Total: 6 ug/dL (ref 4.5–12.0)
TSH: 2.05 u[IU]/mL (ref 0.450–4.500)

## 2015-02-14 NOTE — Addendum Note (Signed)
Addended by: Marin Olp on: 02/14/2015 09:07 AM   Modules accepted: Miquel Dunn

## 2015-02-14 NOTE — Telephone Encounter (Signed)
-----   Message from [facility] [place] sent at 02/14/2015  8:10 AM EDT ----- Kidney and liver function stable ldl are elevated as well as trig- refuses statin or fenofibrate due to pain Thyroid panel normal Continue current meds- low fat diet and exercise and recheck in 3 months

## 2015-02-14 NOTE — Telephone Encounter (Signed)
Pt notified of results Verbalizes understanding 

## 2015-02-19 ENCOUNTER — Encounter: Payer: Self-pay | Admitting: Adult Health

## 2015-02-19 ENCOUNTER — Ambulatory Visit (INDEPENDENT_AMBULATORY_CARE_PROVIDER_SITE_OTHER): Payer: Medicare Other | Admitting: Adult Health

## 2015-02-19 VITALS — BP 133/50 | HR 62 | Ht 65.0 in | Wt 169.0 lb

## 2015-02-19 DIAGNOSIS — G10 Huntington's disease: Secondary | ICD-10-CM

## 2015-02-19 NOTE — Patient Instructions (Signed)
Continue haldol 1.79m twice a day If symptoms worsen or you develop new symptoms please let me know.

## 2015-02-19 NOTE — Progress Notes (Signed)
PATIENT: Sergio Baker DOB: Oct 21, 1934  REASON FOR VISIT: follow up- Huntington's disease HISTORY FROM: patient  HISTORY OF PRESENT ILLNESS: Sergio Baker is a 80 year old male with a history of Huntington's disease. He returns today for follow-up. He continues to take Haldol 1.5 mg twice a day. He reports that this has continued to work well for him. The patient states that his memory has remained stable. He is able to complete all ADLs independently. He operates a Teacher, music without difficulty. No changes in his gait or balance. Denies any falls. He returns today for an evaluation  HISTORY 07/23/14: Sergio Baker is a 80 year old male with a history of Huntington's disease. The patient returns today for follow-up. He is currently taking haldol 1.5 mg twice a day and he states that this is working well for him. The patient reports that his memory has remained the same. The wife agrees. The patient continues to operate a motor vehicle without difficulty. Denies getting lost. At home the patient is able to complete all ADLs without assistance. He also helps to care for his wife. His gait has remained the same. No falls. Denies any new medical history.   HISTORY 01/17/14 (Sergio Baker): -year-old right-handed white male with a history of Huntington's disease. The patient has a daughter with a similar problem, and he is less affected than his daughter. He is on low-dose Haldol taking 1.5 mg twice daily. He has done relatively well with minimal progression of the last one year. He does have some mild memory issues, but he continues to operate a motor vehicle without difficulty. He has not given up any activities of daily living secondary to memory. He denies any falls, but his gait is somewhat unstable. He continues to have some excessive daytime drowsiness, and he has problems with bilateral rotator cuff injuries. The patient returns to this office for an evaluation. He denies problems with choking or  swallowing.  REVIEW OF SYSTEMS: Out of a complete 14 system review of symptoms, the patient complains only of the following symptoms, and all other reviewed systems are negative.  Hearing loss, frequency of urination, urgency, daytime sleepiness, memory loss  ALLERGIES: No Known Allergies  HOME MEDICATIONS: Outpatient Prescriptions Prior to Visit  Medication Sig Dispense Refill  . aspirin EC 81 MG tablet Take 81 mg by mouth daily.    Sergio Baker 550 MG CAPS Take 1 tablet by mouth.    . Calcium Carbonate-Vitamin D (CALCIUM 600/VITAMIN D PO) Take 1 tablet by mouth daily.    . Cholecalciferol (VITAMIN D3) 2000 UNITS capsule Take 2,000 Units by mouth daily.    . citalopram (CELEXA) 20 MG tablet TAKE ONE (1) TABLET EACH DAY 30 tablet 5  . glucosamine-chondroitin 500-400 MG tablet Take 1 tablet by mouth daily.    . haloperidol (HALDOL) 1 MG tablet Take 1.5 tablets (1.5 mg total) by mouth 2 (two) times daily. 90 tablet 3  . levothyroxine (SYNTHROID, LEVOTHROID) 50 MCG tablet TAKE ONE (1) TABLET EACH DAY 90 tablet 3  . mesalamine (LIALDA) 1.2 G EC tablet Take 4 tablets (4.8 g total) by mouth daily with breakfast. 120 tablet 11  . Multiple Vitamin (MULTIVITAMIN WITH MINERALS) TABS Take 1 tablet by mouth daily.    . Omega-3 Fatty Acids 1200 MG CAPS Take 1,200 capsules by mouth daily.    Marland Kitchen omeprazole (PRILOSEC) 20 MG capsule TAKE ONE (1) CAPSULE EACH DAY 90 capsule 1   No facility-administered medications prior to visit.  PAST MEDICAL HISTORY: Past Medical History  Diagnosis Date  . Proctocolitis, ulcerative     Diagnosed in the 1980s.  Marland Kitchen GERD (gastroesophageal reflux disease)   . A-fib   . Hemorrhoids   . Diverticula of colon   . Hiatal hernia     small  . Schatzki's ring   . Ulcerative colitis     left side  . Huntington's disease 2010  . Depression   . Arthritis   . Enlarged prostate   . Joint pain   . Hearing loss   . Abnormality of gait   . Hyperlipidemia   . Memory  deficits 01/17/2014  . HOH (hard of hearing)     PAST SURGICAL HISTORY: Past Surgical History  Procedure Laterality Date  . Appendectomy    . Tonsillectomy    . Cyst removed from urinary bladder    . Colonoscopy  02/2010    patchy erythema, minimal granularity in distal rectum, left-sided diverticula, ileal diverticulum.  Bx benign. next TCS 02/2012  . Esophagogastroduodenoscopy  04/2008    schatzki ring, s/p ED, hh  . Colonoscopy  03/24/2012    Dr. Gala Romney- inflammatory changes of the rectum and colon consistent with history of U/C. bx= benign colonic mucosa  . Shoulder arthroscopy w/ rotator cuff repair Bilateral   . Lumbar fusion    . Colonoscopy N/A 10/05/2014    RMR: abnormal rectal and sigmoid mucosa as described above status post segmental biopsy. active colitis sigmoid colon.    FAMILY HISTORY: Family History  Problem Relation Age of Onset  . Lung cancer Mother 31    deceased  . Colon cancer Neg Hx   . Other Brother     Posttraumatic stress disorder  . Cancer Brother     skin cancer  . Huntington's disease Daughter   . Cancer Sister     thyroid  . Arthritis Sister   . SIDS Brother     SOCIAL HISTORY: History   Social History  . Marital Status: Married    Spouse Name: [name]  . Number of Children: 1  . Years of Education: 12th   Occupational History  . retired Furniture conservator/restorer   . works as Administrator, arts at [employer]    Social History Main Topics  . Smoking status: Former Smoker -- 0.25 packs/day    Types: Cigarettes  . Smokeless tobacco: Never Used     Comment: Quit 1970's   . Alcohol Use: No  . Drug Use: No  . Sexual Activity: Not on file   Other Topics Concern  . Not on file   Social History Narrative   Patient lives at home with spouse.   Caffeine Use: 1 cup weekly      PHYSICAL EXAM  Filed Vitals:   02/19/15 1312  BP: 133/50  Pulse: 62  Height: 5' 5"  (1.651 m)  Weight: 169 lb (76.658 kg)   Body mass index is 28.12 kg/(m^2).  Generalized: Well  developed, in no acute distress   Neurological examination  Mentation: Alert oriented to time, place, history taking. Follows all commands speech and language fluent. MMSE 28/30 Cranial nerve II-XII: Pupils were equal round reactive to light. Extraocular movements were full, visual field were full on confrontational test. Facial sensation and strength were normal. Uvula tongue midline. Head turning and shoulder shrug  were normal and symmetric. Motor: The motor testing reveals 5 over 5 strength of all 4 extremities. Good symmetric motor tone is noted throughout.  Sensory: Sensory testing is intact to  soft touch on all 4 extremities. No evidence of extinction is noted.  Coordination: Cerebellar testing reveals good finger-nose-finger and heel-to-shin bilaterally.  Gait and station: Gait is wide-based. Tandem gait is unsteady. Romberg is negative. No drift is seen.  Reflexes: Deep tendon reflexes are symmetric and normal bilaterally.    DIAGNOSTIC DATA (LABS, IMAGING, TESTING) - I reviewed patient records, labs, notes, testing and imaging myself where available.  ASSESSMENT AND PLAN 80 y.o. year old male  has a past medical history of Proctocolitis, ulcerative; GERD (gastroesophageal reflux disease); A-fib; Hemorrhoids; Diverticula of colon; Hiatal hernia; Schatzki's ring; Ulcerative colitis; Huntington's disease (2010); Depression; Arthritis; Enlarged prostate; Joint pain; Hearing loss; Abnormality of gait; Hyperlipidemia; Memory deficits (01/17/2014); and HOH (hard of hearing). here with:  1. Huntington's disease  Overall the patient is doing well. He will continue taking out all 0.5 mg twice a day. Patient's memory is stable. His MMSE is 28/30 today. Patient advised that if his symptoms worsen or he develops new symptoms he should let us know. Otherwise he will follow-up in 6 months or sooner if needed.   Ward Givens, MSN, NP-C 02/19/2015, 1:27 PM [facility] [address], [place], [place] [address] [phone]  Note: This document was prepared with digital dictation and possible smart phrase technology. Any transcriptional errors that result from this process are unintentional.

## 2015-02-19 NOTE — Progress Notes (Signed)
I have read the note, and I agree with the clinical assessment and plan.  WILLIS,CHARLES KEITH   

## 2015-03-01 ENCOUNTER — Telehealth: Payer: Self-pay | Admitting: Internal Medicine

## 2015-03-01 NOTE — Telephone Encounter (Signed)
Patient's wife called to let us know that patient is in the donut hole with his insurance and his Doristine Johns is normally $45 a month and now its nearly $1000 a month. She was asking if he could come by to get samples if we have any. I told her per nurse that we have a few boxes and that if he is coming today to be here before noon because we close at noon on Fridays or they could come Monday morning and I would have them up front. She said they would come today before noon.

## 2015-03-01 NOTE — Telephone Encounter (Signed)
A month supply of samples given.

## 2015-03-07 DIAGNOSIS — H524 Presbyopia: Secondary | ICD-10-CM | POA: Diagnosis not present

## 2015-03-07 DIAGNOSIS — H5203 Hypermetropia, bilateral: Secondary | ICD-10-CM | POA: Diagnosis not present

## 2015-03-07 DIAGNOSIS — H25813 Combined forms of age-related cataract, bilateral: Secondary | ICD-10-CM | POA: Diagnosis not present

## 2015-03-07 DIAGNOSIS — H52223 Regular astigmatism, bilateral: Secondary | ICD-10-CM | POA: Diagnosis not present

## 2015-04-01 ENCOUNTER — Telehealth: Payer: Self-pay | Admitting: Internal Medicine

## 2015-04-01 NOTE — Telephone Encounter (Signed)
Samples are up front for him to pick up. He is aware

## 2015-04-01 NOTE — Telephone Encounter (Signed)
Pt's wife called to see if patient could get Lialda samples. I told her that I would have to check with the nurse and someone would call her back. She is going out to run errands and said to [facility] if she didn't answer. [phone]

## 2015-04-15 ENCOUNTER — Encounter: Payer: Self-pay | Admitting: Physician Assistant

## 2015-04-15 ENCOUNTER — Ambulatory Visit (INDEPENDENT_AMBULATORY_CARE_PROVIDER_SITE_OTHER): Payer: Medicare Other | Admitting: Physician Assistant

## 2015-04-15 VITALS — BP 147/84 | HR 80 | Temp 98.0°F | Ht 65.0 in | Wt 171.0 lb

## 2015-04-15 DIAGNOSIS — L570 Actinic keratosis: Secondary | ICD-10-CM

## 2015-04-15 DIAGNOSIS — L219 Seborrheic dermatitis, unspecified: Secondary | ICD-10-CM | POA: Diagnosis not present

## 2015-04-15 DIAGNOSIS — L821 Other seborrheic keratosis: Secondary | ICD-10-CM

## 2015-04-15 DIAGNOSIS — L851 Acquired keratosis [keratoderma] palmaris et plantaris: Secondary | ICD-10-CM

## 2015-04-15 NOTE — Patient Instructions (Signed)
Nizoral shampoo ( blue bottle) - Wash face/ beard area. Leave on for 5 min. Rinse

## 2015-04-15 NOTE — Progress Notes (Signed)
   Subjective:    Patient ID: Sergio Baker, male    DOB: February 11, 1935, 80 y.o.   MRN: [id number]  HPI 80 y/o male presents with c/o "rash" on scalp and BLE. Nonpruritic but states that the lesions scale up, peel off and come back. Fair skin. No h/o skin cancer.     Review of Systems  Constitutional: Negative.   HENT: Negative.   Eyes: Negative.   Respiratory: Negative.   Cardiovascular: Negative.   Gastrointestinal: Negative.   Skin:       Scaling lesions on scalp and BLE   Allergic/Immunologic: Negative.   Neurological: Negative.   Hematological: Negative.   Psychiatric/Behavioral: Negative.        Objective:   Physical Exam  Constitutional: He is oriented to person, place, and time. He appears well-developed and well-nourished. No distress.  Musculoskeletal: Normal range of motion. He exhibits no edema or tenderness.  Neurological: He is alert and oriented to person, place, and time.  Skin: He is not diaphoretic.  White scaling papules on BLE, increasing around feet Scaling hyperkeratotic plaques on scalp and left temple.   White scale with underlying erythema around beard and nasolabial folds on face.   Psychiatric: He has a normal mood and affect. His behavior is normal. Judgment and thought content normal.  Nursing note and vitals reviewed.         Assessment & Plan:  1. Actinic keratosis Areas of face, scalp treated with cryosurgery x 6. If lesions recur, need biopsy to r/o BCC/SCC  2. Seborrheic dermatitis - Continue to use otc hydrocortisone prn for scale - otc nizoral 2-3 times/ week on face and beard. Leave on for 5 min. Rinse.   3. Stucco keratoses of BLE - no treatment required - Advise patient to use dove soap and moisturizer with exfoliation for best results    F/U if s/s worsen or do not improve   Tiffany A. Benjamin Stain PA-C

## 2015-05-09 ENCOUNTER — Telehealth: Payer: Self-pay | Admitting: Internal Medicine

## 2015-05-09 NOTE — Telephone Encounter (Signed)
Pt is out of Lialda and needs samples. Please call [phone]

## 2015-05-13 NOTE — Telephone Encounter (Signed)
Patient's wife called in stating that he was getting low on his Lialda.  I instructed her that she can fill out a financial application for the drug company to see if they will help cover his medication.   Almyra Free will mail out the application, because we are currently out of samples for Lialda.

## 2015-05-13 NOTE — Telephone Encounter (Signed)
Paperwork is in the mail to the pt.

## 2015-05-16 ENCOUNTER — Ambulatory Visit (INDEPENDENT_AMBULATORY_CARE_PROVIDER_SITE_OTHER): Payer: Medicare Other

## 2015-05-16 DIAGNOSIS — Z23 Encounter for immunization: Secondary | ICD-10-CM | POA: Diagnosis not present

## 2015-05-20 ENCOUNTER — Other Ambulatory Visit: Payer: Self-pay | Admitting: Nurse Practitioner

## 2015-05-29 ENCOUNTER — Encounter: Payer: Self-pay | Admitting: Nurse Practitioner

## 2015-05-29 ENCOUNTER — Ambulatory Visit (INDEPENDENT_AMBULATORY_CARE_PROVIDER_SITE_OTHER): Payer: Medicare Other | Admitting: Nurse Practitioner

## 2015-05-29 VITALS — BP 131/80 | HR 69 | Temp 96.8°F | Ht 65.0 in | Wt 168.0 lb

## 2015-05-29 DIAGNOSIS — K51211 Ulcerative (chronic) proctitis with rectal bleeding: Secondary | ICD-10-CM | POA: Diagnosis not present

## 2015-05-29 DIAGNOSIS — E034 Atrophy of thyroid (acquired): Secondary | ICD-10-CM | POA: Diagnosis not present

## 2015-05-29 DIAGNOSIS — F329 Major depressive disorder, single episode, unspecified: Secondary | ICD-10-CM

## 2015-05-29 DIAGNOSIS — K5120 Ulcerative (chronic) proctitis without complications: Secondary | ICD-10-CM | POA: Diagnosis not present

## 2015-05-29 DIAGNOSIS — K219 Gastro-esophageal reflux disease without esophagitis: Secondary | ICD-10-CM | POA: Diagnosis not present

## 2015-05-29 DIAGNOSIS — I482 Chronic atrial fibrillation, unspecified: Secondary | ICD-10-CM

## 2015-05-29 DIAGNOSIS — F32A Depression, unspecified: Secondary | ICD-10-CM

## 2015-05-29 DIAGNOSIS — E785 Hyperlipidemia, unspecified: Secondary | ICD-10-CM

## 2015-05-29 DIAGNOSIS — G10 Huntington's disease: Secondary | ICD-10-CM | POA: Diagnosis not present

## 2015-05-29 DIAGNOSIS — Z6827 Body mass index (BMI) 27.0-27.9, adult: Secondary | ICD-10-CM

## 2015-05-29 DIAGNOSIS — E038 Other specified hypothyroidism: Secondary | ICD-10-CM | POA: Diagnosis not present

## 2015-05-29 DIAGNOSIS — Z125 Encounter for screening for malignant neoplasm of prostate: Secondary | ICD-10-CM | POA: Diagnosis not present

## 2015-05-29 MED ORDER — LEVOTHYROXINE SODIUM 50 MCG PO TABS: ORAL_TABLET | ORAL | Status: DC

## 2015-05-29 MED ORDER — OMEPRAZOLE 20 MG PO CPDR: DELAYED_RELEASE_CAPSULE | ORAL | Status: DC

## 2015-05-29 MED ORDER — CITALOPRAM HYDROBROMIDE 20 MG PO TABS: ORAL_TABLET | ORAL | Status: DC

## 2015-05-29 NOTE — Patient Instructions (Signed)
Heart-Healthy Eating Plan Many factors influence your heart health, including eating and exercise habits. Heart (coronary) risk increases with abnormal blood fat (lipid) levels. Heart-healthy meal planning includes limiting unhealthy fats, increasing healthy fats, and making other small dietary changes. This includes maintaining a healthy body weight to help keep lipid levels within a normal range. WHAT IS MY PLAN?  Your health care provider recommends that you:  Get no more than ____20_____% of the total calories in your daily diet from fat.  Limit your intake of saturated fat to less than ____20_____% of your total calories each day.  Limit the amount of cholesterol in your diet to less than __60_______ mg per day. WHAT TYPES OF FAT SHOULD I CHOOSE?  Choose healthy fats more often. Choose monounsaturated and polyunsaturated fats, such as olive oil and canola oil, flaxseeds, walnuts, almonds, and seeds.  Eat more omega-3 fats. Good choices include salmon, mackerel, sardines, tuna, flaxseed oil, and ground flaxseeds. Aim to eat fish at least two times each week.  Limit saturated fats. Saturated fats are primarily found in animal products, such as meats, butter, and cream. Plant sources of saturated fats include palm oil, palm kernel oil, and coconut oil.  Avoid foods with partially hydrogenated oils in them. These contain trans fats. Examples of foods that contain trans fats are stick margarine, some tub margarines, cookies, crackers, and other baked goods. WHAT GENERAL GUIDELINES DO I NEED TO FOLLOW?  Check food labels carefully to identify foods with trans fats or high amounts of saturated fat.  Fill one half of your plate with vegetables and green salads. Eat 4-5 servings of vegetables per day. A serving of vegetables equals 1 cup of raw leafy vegetables,  cup of raw or cooked cut-up vegetables, or  cup of vegetable juice.  Fill one fourth of your plate with whole grains. Look for the  word "whole" as the first word in the ingredient list.  Fill one fourth of your plate with lean protein foods.  Eat 4-5 servings of fruit per day. A serving of fruit equals one medium whole fruit,  cup of dried fruit,  cup of fresh, frozen, or canned fruit, or  cup of 100% fruit juice.  Eat more foods that contain soluble fiber. Examples of foods that contain this type of fiber are apples, broccoli, carrots, beans, peas, and barley. Aim to get 20-30 g of fiber per day.  Eat more home-cooked food and less restaurant, buffet, and fast food.  Limit or avoid alcohol.  Limit foods that are high in starch and sugar.  Avoid fried foods.  Cook foods by using methods other than frying. Baking, boiling, grilling, and broiling are all great options. Other fat-reducing suggestions include:  Removing the skin from poultry.  Removing all visible fats from meats.  Skimming the fat off of stews, soups, and gravies before serving them.  Steaming vegetables in water or broth.  Lose weight if you are overweight. Losing just 5-10% of your initial body weight can help your overall health and prevent diseases such as diabetes and heart disease.  Increase your consumption of nuts, legumes, and seeds to 4-5 servings per week. One serving of dried beans or legumes equals  cup after being cooked, one serving of nuts equals 1 ounces, and one serving of seeds equals  ounce or 1 tablespoon.  You may need to monitor your salt (sodium) intake, especially if you have high blood pressure. Talk with your health care provider or dietitian to get  more information about reducing sodium. WHAT FOODS CAN I EAT? Grains Breads, including [place], white, pita, wheat, raisin, rye, oatmeal, and [place]. Tortillas that are neither fried nor made with lard or trans fat. Low-fat rolls, including hotdog and hamburger buns and English muffins. Biscuits. Muffins. Waffles. Pancakes. Light popcorn. Whole-grain cereals. Flatbread.  Melba toast. Pretzels. Breadsticks. Rusks. Low-fat snacks and crackers, including oyster, saltine, matzo, graham, animal, and rye. Rice and pasta, including brown rice and those that are made with whole wheat. Vegetables All vegetables. Fruits All fruits, but limit coconut. Meats and Other Protein Sources Lean, well-trimmed beef, veal, pork, and lamb. Chicken and [place] without skin. All fish and shellfish. Wild duck, rabbit, pheasant, and venison. Egg whites or low-cholesterol egg substitutes. Dried beans, peas, lentils, and tofu.Seeds and most nuts. Dairy Low-fat or nonfat cheeses, including ricotta, string, and mozzarella. Skim or 1% milk that is liquid, powdered, or evaporated. Buttermilk that is made with low-fat milk. Nonfat or low-fat yogurt. Beverages Mineral water. Diet carbonated beverages. Sweets and Desserts Sherbets and fruit ices. Honey, jam, marmalade, jelly, and syrups. Meringues and gelatins. Pure sugar candy, such as hard candy, jelly beans, gumdrops, mints, marshmallows, and small amounts of dark chocolate. [employer] Eat all sweets and desserts in moderation. Fats and Oils Nonhydrogenated (trans-free) margarines. Vegetable oils, including soybean, sesame, sunflower, olive, peanut, safflower, corn, canola, and cottonseed. Salad dressings or mayonnaise that are made with a vegetable oil. Limit added fats and oils that you use for cooking, baking, salads, and as spreads. Other Cocoa powder. Coffee and tea. All seasonings and condiments. The items listed above may not be a complete list of recommended foods or beverages. Contact your dietitian for more options. WHAT FOODS ARE NOT RECOMMENDED? Grains Breads that are made with saturated or trans fats, oils, or whole milk. Croissants. Butter rolls. Cheese breads. Sweet rolls. Donuts. Buttered popcorn. Chow mein noodles. High-fat crackers, such as cheese or butter crackers. Meats and Other Protein Sources Fatty meats, such  as hotdogs, short ribs, sausage, spareribs, bacon, ribeye roast or steak, and mutton. High-fat deli meats, such as salami and bologna. Caviar. Domestic duck and goose. Organ meats, such as kidney, liver, sweetbreads, brains, gizzard, chitterlings, and heart. Dairy Cream, sour cream, cream cheese, and creamed cottage cheese. Whole milk cheeses, including blue (bleu), Monte[place] Jack, [place], [place], American, [place], Swiss, [place], [place], and [place]. Whole or 2% milk that is liquid, evaporated, or condensed. Whole buttermilk. Cream sauce or high-fat cheese sauce. Yogurt that is made from whole milk. Beverages Regular sodas and drinks with added sugar. Sweets and Desserts Frosting. Pudding. Cookies. Cakes other than angel food cake. Candy that has milk chocolate or white chocolate, hydrogenated fat, butter, coconut, or unknown ingredients. Buttered syrups. Full-fat ice cream or ice cream drinks. Fats and Oils Gravy that has suet, meat fat, or shortening. Cocoa butter, hydrogenated oils, palm oil, coconut oil, palm kernel oil. These can often be found in baked products, candy, fried foods, nondairy creamers, and whipped toppings. Solid fats and shortenings, including bacon fat, salt pork, lard, and butter. Nondairy cream substitutes, such as coffee creamers and sour cream substitutes. Salad dressings that are made of unknown oils, cheese, or sour cream. The items listed above may not be a complete list of foods and beverages to avoid. Contact your dietitian for more information.   This information is not intended to replace advice given to you by your health care provider. Make sure you discuss any questions you have with your health  care provider.   Document Released: 05/12/2008 Document Revised: 08/24/2014 Document Reviewed: 01/25/2014 Elsevier Interactive Patient Education [employer].

## 2015-05-29 NOTE — Progress Notes (Signed)
Subjective:    Patient ID: Sergio Baker, male    DOB: 1934/12/06, 80 y.o.   MRN: [id number]   Patient here today for follow up of chronic medical problems.  Atrial Fibrillation Presents for follow-up visit. Symptoms are negative for chest pain, shortness of breath and weakness. Past medical history includes atrial fibrillation and hyperlipidemia.  Hyperlipidemia This is a chronic problem. The current episode started more than 1 year ago. Recent lipid tests were reviewed and are variable. Pertinent negatives include no chest pain or shortness of breath. Current antihyperlipidemic treatment includes statins. The current treatment provides moderate improvement of lipids. Compliance problems include adherence to diet and adherence to exercise.  Risk factors for coronary artery disease include dyslipidemia and male sex.   Huntington's No much change since last visit-Was encouraged to use a cane as gait has been more unsteady but has not felt the need to use it as he has not fallen.  Hypothyroidism  Currnetly on levothyroxin 50 mcg- doing well- no c/o fatigue Depression Currently on celexa which he says is working well without side effects. Hyperlipidemia  Patient has had for >10 years. He is currently taking crestor without any problems. He is trying to watch diet. GERD Omeprazole daily keeps symptoms under control Ulcerative colitis Has had recent flare up and was put on lialda which is helping Atrial fib currently on no meds  Review of Systems  Constitutional: Negative.   HENT: Negative.   Respiratory: Negative.  Negative for chest tightness and shortness of breath.   Cardiovascular: Negative.  Negative for chest pain.  Gastrointestinal: Negative for nausea and abdominal pain.  Genitourinary: Negative.   Musculoskeletal: Positive for arthralgias and gait problem (Some unsteadiness, denies falls. ). Negative for joint swelling and neck stiffness.  Neurological: Negative.  Negative  for tremors, syncope and weakness.  Psychiatric/Behavioral: Negative.   All other systems reviewed and are negative.      Objective:   Physical Exam  Constitutional: He is oriented to person, place, and time. He appears well-developed and well-nourished.  HENT:  Head: Normocephalic.  Right Ear: External ear normal.  Left Ear: External ear normal.  Nose: Nose normal.  Mouth/Throat: Oropharynx is clear and moist.  Eyes: EOM are normal. Pupils are equal, round, and reactive to light.  Neck: Normal range of motion. Neck supple. No JVD present. No thyromegaly present.  Cardiovascular: Normal rate, normal heart sounds and intact distal pulses.  Exam reveals no gallop and no friction rub.   No murmur heard. Irregular heart rate   Pulmonary/Chest: Effort normal and breath sounds normal. No respiratory distress. He has no wheezes. He has no rales. He exhibits no tenderness.  Abdominal: Soft. Bowel sounds are normal. He exhibits no mass. There is no tenderness.  Genitourinary: Prostate normal and penis normal.  Musculoskeletal: Normal range of motion. He exhibits no edema.  Constant jerking movements. Gait slightly unsteady.  Lymphadenopathy:    He has no cervical adenopathy.  Neurological: He is alert and oriented to person, place, and time. No cranial nerve deficit. Abnormal gait: Unsteady gait.   Skin: Skin is warm and dry.  Psychiatric: He has a normal mood and affect. His speech is normal and behavior is normal. Judgment and thought content normal.  Vitals reviewed.  BP 131/80 mmHg  Pulse 69  Temp(Src) 96.8 F (36 C) (Oral)  Ht 5' 5"  (1.651 m)  Wt 168 lb (76.204 kg)  BMI 27.96 kg/m2        Assessment &  Plan:  1. BMI 27.0-27.9,adult Discussed diet and exercise for person with BMI >25 Will recheck weight in 3-6 months   2. Ulcerative (chronic) proctitis, with rectal bleeding ([place]) Continue appointments with specialist  3. 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 Gastroesophageal reflux disease, esophagitis presence not specified Avoid spicy foods Do not eat 2 hours prior to bedtime - omeprazole (PRILOSEC) 20 MG capsule; TAKE ONE (1) CAPSULE EACH DAY  Dispense: 90 capsule; Refill: 1  5. Hypothyroidism due to acquired atrophy of thyroid - levothyroxine (SYNTHROID, LEVOTHROID) 50 MCG tablet; TAKE ONE (1) TABLET EACH DAY  Dispense: 90 tablet; Refill: 3  6. Huntington's chorea ([place]) Keep appointmentts with neurologist  7. Hyperlipidemia with target LDL less than 100 Low fat diet - CMP14+EGFR - Lipid panel  8. Depression Stress management - citalopram (CELEXA) 20 MG tablet; TAKE ONE (1) TABLET EACH DAY  Dispense: 30 tablet; Refill: 5  9. Chronic atrial fibrillation (HCC)  10. Prostate cancer screening  - PSA, total and free    Labs pending Health maintenance reviewed Diet and exercise encouraged Continue all meds Follow up  In 3 month   [place], FNP

## 2015-05-30 LAB — CMP14+EGFR
A/G RATIO: 1.6 (ref 1.1–2.5)
ALBUMIN: 4.5 g/dL (ref 3.5–4.8)
ALT: 16 IU/L (ref 0–44)
AST: 31 IU/L (ref 0–40)
Alkaline Phosphatase: 70 IU/L (ref 39–117)
BILIRUBIN TOTAL: 0.5 mg/dL (ref 0.0–1.2)
BUN / CREAT RATIO: 15 (ref 10–22)
BUN: 11 mg/dL (ref 8–27)
CHLORIDE: 98 mmol/L (ref 97–108)
CO2: 25 mmol/L (ref 18–29)
Calcium: 9.4 mg/dL (ref 8.6–10.2)
Creatinine, Ser: 0.72 mg/dL — ABNORMAL LOW (ref 0.76–1.27)
GFR calc non Af Amer: 89 mL/min/{1.73_m2} (ref >59)
GFR, EST AFRICAN AMERICAN: 103 mL/min/{1.73_m2} (ref >59)
GLUCOSE: 97 mg/dL (ref 65–99)
Globulin, Total: 2.8 g/dL (ref 1.5–4.5)
POTASSIUM: 5.1 mmol/L (ref 3.5–5.2)
SODIUM: 142 mmol/L (ref 134–144)
Total Protein: 7.3 g/dL (ref 6.0–8.5)

## 2015-05-30 LAB — LIPID PANEL
CHOLESTEROL TOTAL: 204 mg/dL — AB (ref 100–199)
Chol/HDL Ratio: 6.6 ratio units — ABNORMAL HIGH (ref 0.0–5.0)
HDL: 31 mg/dL — ABNORMAL LOW (ref >39)
LDL Calculated: 128 mg/dL — ABNORMAL HIGH (ref 0–99)
Triglycerides: 224 mg/dL — ABNORMAL HIGH (ref 0–149)
VLDL Cholesterol Cal: 45 mg/dL — ABNORMAL HIGH (ref 5–40)

## 2015-05-30 LAB — PSA, TOTAL AND FREE
PROSTATE SPECIFIC AG, SERUM: 9 ng/mL — AB (ref 0.0–4.0)
PSA FREE PCT: 15.2 %
PSA FREE: 1.37 ng/mL

## 2015-06-05 DIAGNOSIS — M5126 Other intervertebral disc displacement, lumbar region: Secondary | ICD-10-CM | POA: Diagnosis not present

## 2015-06-05 DIAGNOSIS — B379 Candidiasis, unspecified: Secondary | ICD-10-CM | POA: Diagnosis not present

## 2015-06-05 DIAGNOSIS — M4727 Other spondylosis with radiculopathy, lumbosacral region: Secondary | ICD-10-CM | POA: Diagnosis not present

## 2015-07-01 ENCOUNTER — Telehealth: Payer: Self-pay | Admitting: Nurse Practitioner

## 2015-07-01 NOTE — Telephone Encounter (Signed)
Faxed psa to [facility] urology at [phone]

## 2015-07-09 DIAGNOSIS — N403 Nodular prostate with lower urinary tract symptoms: Secondary | ICD-10-CM | POA: Diagnosis not present

## 2015-07-09 DIAGNOSIS — R351 Nocturia: Secondary | ICD-10-CM | POA: Diagnosis not present

## 2015-07-09 DIAGNOSIS — R972 Elevated prostate specific antigen [PSA]: Secondary | ICD-10-CM | POA: Diagnosis not present

## 2015-07-09 DIAGNOSIS — N3941 Urge incontinence: Secondary | ICD-10-CM | POA: Diagnosis not present

## 2015-08-22 ENCOUNTER — Ambulatory Visit (INDEPENDENT_AMBULATORY_CARE_PROVIDER_SITE_OTHER): Payer: Medicare Other | Admitting: Adult Health

## 2015-08-22 ENCOUNTER — Encounter: Payer: Self-pay | Admitting: Adult Health

## 2015-08-22 VITALS — BP 134/78 | HR 71 | Ht 65.0 in | Wt 167.0 lb

## 2015-08-22 DIAGNOSIS — G10 Huntington's disease: Secondary | ICD-10-CM | POA: Diagnosis not present

## 2015-08-22 DIAGNOSIS — R413 Other amnesia: Secondary | ICD-10-CM | POA: Diagnosis not present

## 2015-08-22 NOTE — Progress Notes (Signed)
I have read the note, and I agree with the clinical assessment and plan.  Sergio Baker,Sergio Baker   

## 2015-08-22 NOTE — Progress Notes (Signed)
PATIENT: Sergio Baker DOB: 09/19/1934  REASON FOR VISIT: follow up- Huntington's disease HISTORY FROM: patient  HISTORY OF PRESENT ILLNESS: Sergio Baker is a 81 year old male with a history of huntington's disease. He returns today for follow-up. He continues to take Haldol 1.5 mg BID. He is tolerating this medication well. He reports that his Huntington's is controlled with this. The patient feels that his memory has remained stable. He continues to be able to complete all ADLs independently. He operates a Teacher, music without difficulty. He actually cares for his wife at home. He denies any significant changes with his gait or balance. Denies any falls. He denies any new neurological symptoms. He returns today for an evaluation.  HISTORY 02/19/15: Sergio Baker is a 81 year old male with a history of Huntington's disease. He returns today for follow-up. He continues to take Haldol 1.5 mg twice a day. He reports that this has continued to work well for him. The patient states that his memory has remained stable. He is able to complete all ADLs independently. He operates a Teacher, music without difficulty. No changes in his gait or balance. Denies any falls. He returns today for an evaluation   REVIEW OF SYSTEMS: Out of a complete 14 system review of symptoms, the patient complains only of the following symptoms, and all other reviewed systems are negative.  Urgency, tremors, restless leg, daytime sleepiness, snoring, moles, itching, hearing loss  ALLERGIES: No Known Allergies  HOME MEDICATIONS: Outpatient Prescriptions Prior to Visit  Medication Sig Dispense Refill  . aspirin EC 81 MG tablet Take 81 mg by mouth daily.    Raelyn Ensign Pollen 550 MG CAPS Take 1 tablet by mouth.    . Calcium Carbonate-Vitamin D (CALCIUM 600/VITAMIN D PO) Take 1 tablet by mouth daily.    . Cholecalciferol (VITAMIN D3) 2000 UNITS capsule Take 2,000 Units by mouth daily.    . citalopram (CELEXA) 20 MG tablet TAKE  ONE (1) TABLET EACH DAY 30 tablet 5  . glucosamine-chondroitin 500-400 MG tablet Take 1 tablet by mouth daily.    . haloperidol (HALDOL) 1 MG tablet Take 1.5 tablets (1.5 mg total) by mouth 2 (two) times daily. 90 tablet 3  . levothyroxine (SYNTHROID, LEVOTHROID) 50 MCG tablet TAKE ONE (1) TABLET EACH DAY 90 tablet 3  . Multiple Vitamin (MULTIVITAMIN WITH MINERALS) TABS Take 1 tablet by mouth daily.    . Omega-3 Fatty Acids 1200 MG CAPS Take 1,200 capsules by mouth daily.    Marland Kitchen omeprazole (PRILOSEC) 20 MG capsule TAKE ONE (1) CAPSULE EACH DAY 90 capsule 1   No facility-administered medications prior to visit.    PAST MEDICAL HISTORY: Past Medical History  Diagnosis Date  . Proctocolitis, ulcerative ([place])     Diagnosed in the 1980s.  Marland Kitchen GERD (gastroesophageal reflux disease)   . A-fib ([place])   . Hemorrhoids   . Diverticula of colon   . Hiatal hernia     small  . Schatzki's ring   . Ulcerative colitis ([place])     left side  . Huntington's disease ([place]) 2010  . Depression   . Arthritis   . Enlarged prostate   . Joint pain   . Hearing loss   . Abnormality of gait   . Hyperlipidemia   . Memory deficits 01/17/2014  . HOH (hard of hearing)     PAST SURGICAL HISTORY: Past Surgical History  Procedure Laterality Date  . Appendectomy    . Tonsillectomy    .  Cyst removed from urinary bladder    . Colonoscopy  02/2010    patchy erythema, minimal granularity in distal rectum, left-sided diverticula, ileal diverticulum.  Bx benign. next TCS 02/2012  . Esophagogastroduodenoscopy  04/2008    schatzki ring, s/p ED, hh  . Colonoscopy  03/24/2012    Dr. Gala Romney- inflammatory changes of the rectum and colon consistent with history of U/C. bx= benign colonic mucosa  . Shoulder arthroscopy w/ rotator cuff repair Bilateral   . Lumbar fusion    . Colonoscopy N/A 10/05/2014    RMR: abnormal rectal and sigmoid mucosa as described above status post segmental biopsy. active colitis sigmoid colon.     FAMILY HISTORY: Family History  Problem Relation Age of Onset  . Lung cancer Mother 59    deceased  . Colon cancer Neg Hx   . Other Brother     Posttraumatic stress disorder  . Cancer Brother     skin cancer  . Huntington's disease Daughter   . Cancer Sister     thyroid  . Arthritis Sister   . SIDS Brother     SOCIAL HISTORY: Social History   Social History  . Marital Status: Married    Spouse Name: [name]  . Number of Children: 1  . Years of Education: 12th   Occupational History  . retired Furniture conservator/restorer   . works as Administrator, arts at [employer]    Social History Main Topics  . Smoking status: Former Smoker -- 0.25 packs/day    Types: Cigarettes  . Smokeless tobacco: Never Used     Comment: Quit 1970's   . Alcohol Use: No  . Drug Use: No  . Sexual Activity: Not on file   Other Topics Concern  . Not on file   Social History Narrative   Patient lives at home with spouse.   Caffeine Use: 1 cup weekly      PHYSICAL EXAM  Filed Vitals:   08/22/15 1412  BP: 134/78  Pulse: 71  Height: 5' 5"  (1.651 m)  Weight: 167 lb (75.751 kg)   Body mass index is 27.79 kg/(m^2).   MMSE - Mini Mental State Exam 08/22/2015 02/19/2015 12/27/2014  Orientation to time 5 4 4   Orientation to Place 4 5 5   Registration 3 3 3   Attention/ Calculation 5 4 4   Recall 3 3 3   Language- name 2 objects 2 2 2   Language- repeat 1 1 1   Language- follow 3 step command 2 3 3   Language- read & follow direction 1 1 1   Write a sentence 1 1 1   Copy design 1 1 1   Total score 28 28 28     Generalized: Well developed, in no acute distress   Neurological examination  Mentation: Alert oriented to time, place, history taking. Follows all commands speech and language fluent Cranial nerve II-XII: Pupils were equal round reactive to light. Extraocular movements were full, visual field were full on confrontational test. Facial sensation and strength were normal. Uvula tongue midline. Head turning and  shoulder shrug  were normal and symmetric. Motor: The motor testing reveals 5 over 5 strength of all 4 extremities. Good symmetric motor tone is noted throughout.  Sensory: Sensory testing is intact to soft touch on all 4 extremities. No evidence of extinction is noted.  Coordination: Cerebellar testing reveals good finger-nose-finger and heel-to-shin bilaterally.  Gait and station: Gait is normal. Tandem gait is unsteady. Romberg is positive.  Reflexes: Deep tendon reflexes are symmetric and normal bilaterally.  DIAGNOSTIC DATA (LABS, IMAGING, TESTING) - I reviewed patient records, labs, notes, testing and imaging myself where available.  Lab Results  Component Value Date   WBC 6.7 06/19/2013   HGB 14.8 06/19/2013   HCT 44.9 06/19/2013   MCV 88.2 06/19/2013   PLT 228 11/01/2007      Component Value Date/Time   NA 142 05/29/2015 1117   NA 141 12/26/2012 0907   K 5.1 05/29/2015 1117   CL 98 05/29/2015 1117   CO2 25 05/29/2015 1117   GLUCOSE 97 05/29/2015 1117   GLUCOSE 107* 12/26/2012 0907   BUN 11 05/29/2015 1117   BUN 20 12/26/2012 0907   CREATININE 0.72* 05/29/2015 1117   CREATININE 0.90 12/26/2012 0907   CALCIUM 9.4 05/29/2015 1117   PROT 7.3 05/29/2015 1117   PROT 7.4 12/26/2012 0907   ALBUMIN 4.5 05/29/2015 1117   ALBUMIN 4.4 12/26/2012 0907   AST 31 05/29/2015 1117   ALT 16 05/29/2015 1117   ALKPHOS 70 05/29/2015 1117   BILITOT 0.5 05/29/2015 1117   BILITOT 0.5 04/26/2014 1035   GFRNONAA 89 05/29/2015 1117   GFRNONAA 83 12/26/2012 0907   GFRAA 103 05/29/2015 1117   GFRAA >89 12/26/2012 0907   Lab Results  Component Value Date   CHOL 204* 05/29/2015   HDL 31* 05/29/2015   LDLCALC 128* 05/29/2015   TRIG 224* 05/29/2015   CHOLHDL 6.6* 05/29/2015   Lab Results  Component Value Date   HGBA1C 6.1 09/05/2013    Lab Results  Component Value Date   TSH 2.050 02/13/2015      ASSESSMENT AND PLAN 81 y.o. year old male  has a past medical history of  Proctocolitis, ulcerative ([place]); GERD (gastroesophageal reflux disease); A-fib ([place]); Hemorrhoids; Diverticula of colon; Hiatal hernia; Schatzki's ring; Ulcerative colitis ([place]); Huntington's disease ([place]) (2010); Depression; Arthritis; Enlarged prostate; Joint pain; Hearing loss; Abnormality of gait; Hyperlipidemia; Memory deficits (01/17/2014); and HOH (hard of hearing). here with:  1. Huntington's disease  Overall the patient is doing well. He will continue on Haldol. His memory has remained stable. We will continue to monitor. Patient advised that if his symptoms worsen or he develops any new symptoms he should let us know. He will follow-up in 6 months or sooner if needed.     Ward Givens, MSN, NP-C 08/22/2015, 2:32 PM [facility] [address], [place], [place] [address] [phone]

## 2015-08-22 NOTE — Patient Instructions (Signed)
Continue Haldol If your symptoms worsen or you develop new symptoms please let us know.

## 2015-08-29 ENCOUNTER — Ambulatory Visit: Payer: Self-pay | Admitting: Nurse Practitioner

## 2015-09-06 ENCOUNTER — Encounter: Payer: Self-pay | Admitting: Nurse Practitioner

## 2015-09-06 ENCOUNTER — Ambulatory Visit (INDEPENDENT_AMBULATORY_CARE_PROVIDER_SITE_OTHER): Payer: Medicare Other | Admitting: Nurse Practitioner

## 2015-09-06 VITALS — BP 136/84 | HR 68 | Temp 96.9°F | Ht 65.0 in | Wt 166.0 lb

## 2015-09-06 DIAGNOSIS — I482 Chronic atrial fibrillation, unspecified: Secondary | ICD-10-CM

## 2015-09-06 DIAGNOSIS — K5120 Ulcerative (chronic) proctitis without complications: Secondary | ICD-10-CM

## 2015-09-06 DIAGNOSIS — R269 Unspecified abnormalities of gait and mobility: Secondary | ICD-10-CM

## 2015-09-06 DIAGNOSIS — E038 Other specified hypothyroidism: Secondary | ICD-10-CM | POA: Diagnosis not present

## 2015-09-06 DIAGNOSIS — Z1212 Encounter for screening for malignant neoplasm of rectum: Secondary | ICD-10-CM | POA: Diagnosis not present

## 2015-09-06 DIAGNOSIS — K219 Gastro-esophageal reflux disease without esophagitis: Secondary | ICD-10-CM

## 2015-09-06 DIAGNOSIS — E785 Hyperlipidemia, unspecified: Secondary | ICD-10-CM | POA: Diagnosis not present

## 2015-09-06 DIAGNOSIS — E034 Atrophy of thyroid (acquired): Secondary | ICD-10-CM | POA: Diagnosis not present

## 2015-09-06 DIAGNOSIS — Z6827 Body mass index (BMI) 27.0-27.9, adult: Secondary | ICD-10-CM | POA: Diagnosis not present

## 2015-09-06 DIAGNOSIS — R972 Elevated prostate specific antigen [PSA]: Secondary | ICD-10-CM | POA: Diagnosis not present

## 2015-09-06 NOTE — Patient Instructions (Signed)
Fall Prevention in the Home  Falls can cause injuries and can affect people from all age groups. There are many simple things that you can do to make your home safe and to help prevent falls. WHAT CAN I DO ON THE OUTSIDE OF MY HOME?  Regularly repair the edges of walkways and driveways and fix any cracks.  Remove high doorway thresholds.  Trim any shrubbery on the main path into your home.  Use bright outdoor lighting.  Clear walkways of debris and clutter, including tools and rocks.  Regularly check that handrails are securely fastened and in good repair. Both sides of any steps should have handrails.  Install guardrails along the edges of any raised decks or porches.  Have leaves, snow, and ice cleared regularly.  Use sand or salt on walkways during winter months.  In the garage, clean up any spills right away, including grease or oil spills. WHAT CAN I DO IN THE BATHROOM?  Use night lights.  Install grab bars by the toilet and in the tub and shower. Do not use towel bars as grab bars.  Use non-skid mats or decals on the floor of the tub or shower.  If you need to sit down while you are in the shower, use a plastic, non-slip stool..  Keep the floor dry. Immediately clean up any water that spills on the floor.  Remove soap buildup in the tub or shower on a regular basis.  Attach bath mats securely with double-sided non-slip rug tape.  Remove throw rugs and other tripping hazards from the floor. WHAT CAN I DO IN THE BEDROOM?  Use night lights.  Make sure that a bedside light is easy to reach.  Do not use oversized bedding that drapes onto the floor.  Have a firm chair that has side arms to use for getting dressed.  Remove throw rugs and other tripping hazards from the floor. WHAT CAN I DO IN THE KITCHEN?   Clean up any spills right away.  Avoid walking on wet floors.  Place frequently used items in easy-to-reach places.  If you need to reach for something  above you, use a sturdy step stool that has a grab bar.  Keep electrical cables out of the way.  Do not use floor polish or wax that makes floors slippery. If you have to use wax, make sure that it is non-skid floor wax.  Remove throw rugs and other tripping hazards from the floor. WHAT CAN I DO IN THE STAIRWAYS?  Do not leave any items on the stairs.  Make sure that there are handrails on both sides of the stairs. Fix handrails that are broken or loose. Make sure that handrails are as long as the stairways.  Check any carpeting to make sure that it is firmly attached to the stairs. Fix any carpet that is loose or worn.  Avoid having throw rugs at the top or bottom of stairways, or secure the rugs with carpet tape to prevent them from moving.  Make sure that you have a light switch at the top of the stairs and the bottom of the stairs. If you do not have them, have them installed. WHAT ARE SOME OTHER FALL PREVENTION TIPS?  Wear closed-toe shoes that fit well and support your feet. Wear shoes that have rubber soles or low heels.  When you use a stepladder, make sure that it is completely opened and that the sides are firmly locked. Have someone hold the ladder while you   are using it. Do not climb a closed stepladder.  Add color or contrast paint or tape to grab bars and handrails in your home. Place contrasting color strips on the first and last steps.  Use mobility aids as needed, such as canes, walkers, scooters, and crutches.  Turn on lights if it is dark. Replace any light bulbs that burn out.  Set up furniture so that there are clear paths. Keep the furniture in the same spot.  Fix any uneven floor surfaces.  Choose a carpet design that does not hide the edge of steps of a stairway.  Be aware of any and all pets.  Review your medicines with your healthcare provider. Some medicines can cause dizziness or changes in blood pressure, which increase your risk of falling. Talk  with your health care provider about other ways that you can decrease your risk of falls. This may include working with a physical therapist or trainer to improve your strength, balance, and endurance.   This information is not intended to replace advice given to you by your health care provider. Make sure you discuss any questions you have with your health care provider.   Document Released: 07/24/2002 Document Revised: 12/18/2014 Document Reviewed: 09/07/2014 Elsevier Interactive Patient Education 2016 [employer]  

## 2015-09-06 NOTE — Progress Notes (Signed)
Subjective:    Patient ID: Sergio Baker, male    DOB: February 22, 1935, 81 y.o.   MRN: [id number]   Patient here today for follow up of chronic medical problems.  Outpatient Encounter Prescriptions as of 09/06/2015  Medication Sig  . aspirin EC 81 MG tablet Take 81 mg by mouth daily.  Raelyn Ensign Pollen 550 MG CAPS Take 1 tablet by mouth.  . Calcium Carbonate-Vitamin D (CALCIUM 600/VITAMIN D PO) Take 1 tablet by mouth daily.  . Cholecalciferol (VITAMIN D3) 2000 UNITS capsule Take 2,000 Units by mouth daily.  . citalopram (CELEXA) 20 MG tablet TAKE ONE (1) TABLET EACH DAY  . glucosamine-chondroitin 500-400 MG tablet Take 1 tablet by mouth daily.  . haloperidol (HALDOL) 1 MG tablet Take 1.5 tablets (1.5 mg total) by mouth 2 (two) times daily.  Marland Kitchen levothyroxine (SYNTHROID, LEVOTHROID) 50 MCG tablet TAKE ONE (1) TABLET EACH DAY  . Multiple Vitamin (MULTIVITAMIN WITH MINERALS) TABS Take 1 tablet by mouth daily.  . Omega-3 Fatty Acids 1200 MG CAPS Take 1,200 capsules by mouth daily.  Marland Kitchen omeprazole (PRILOSEC) 20 MG capsule TAKE ONE (1) CAPSULE EACH DAY   No facility-administered encounter medications on file as of 09/06/2015.    Atrial Fibrillation Presents for follow-up visit. Symptoms are negative for chest pain, shortness of breath and weakness. Past medical history includes atrial fibrillation and hyperlipidemia.  Hyperlipidemia This is a chronic problem. The current episode started more than 1 year ago. Recent lipid tests were reviewed and are variable. Pertinent negatives include no chest pain or shortness of breath. Current antihyperlipidemic treatment includes statins. The current treatment provides moderate improvement of lipids. Compliance problems include adherence to diet and adherence to exercise.  Risk factors for coronary artery disease include dyslipidemia and male sex.   Huntington's/ gait abnormality No much change since last visit-Was encouraged to use a cane as gait has been more  unsteady but has not felt the need to use it as he has not fallen.  Hypothyroidism  Currnetly on levothyroxin 50 mcg- doing well- no c/o fatigue Depression Currently on celexa which he says is working well without side effects. Hyperlipidemia  Patient has had for >10 years. He is currently taking crestor without any problems. He is trying to watch diet. GERD Omeprazole daily keeps symptoms under control Ulcerative colitis Has had recent flare up and was put on lialda which is helping Atrial fib currently on no meds  * PSA was 9 at last visit- saw urologist and he wanted labs repeated= will repeat today- denies trouble with urination - no hesistancy with stream- does admit to som leakage at times.     Review of Systems  Constitutional: Negative.   HENT: Negative.   Respiratory: Negative.  Negative for chest tightness and shortness of breath.   Cardiovascular: Negative.  Negative for chest pain.  Gastrointestinal: Negative for nausea and abdominal pain.  Genitourinary: Negative.   Musculoskeletal: Positive for arthralgias and gait problem (Some unsteadiness, denies falls. ). Negative for joint swelling and neck stiffness.  Neurological: Negative.  Negative for tremors, syncope and weakness.  Psychiatric/Behavioral: Negative.   All other systems reviewed and are negative.      Objective:   Physical Exam  Constitutional: He is oriented to person, place, and time. He appears well-developed and well-nourished.  HENT:  Head: Normocephalic.  Right Ear: External ear normal.  Left Ear: External ear normal.  Nose: Nose normal.  Mouth/Throat: Oropharynx is clear and moist.  Eyes: EOM are normal.  Pupils are equal, round, and reactive to light.  Neck: Normal range of motion. Neck supple. No JVD present. No thyromegaly present.  Cardiovascular: Normal rate, normal heart sounds and intact distal pulses.  Exam reveals no gallop and no friction rub.   No murmur heard. Irregular heart  rate   Pulmonary/Chest: Effort normal and breath sounds normal. No respiratory distress. He has no wheezes. He has no rales. He exhibits no tenderness.  Abdominal: Soft. Bowel sounds are normal. He exhibits no mass. There is no tenderness.  Genitourinary: Prostate normal and penis normal.  Musculoskeletal: Normal range of motion. He exhibits no edema.  Constant jerking movements. Gait slightly unsteady.  Lymphadenopathy:    He has no cervical adenopathy.  Neurological: He is alert and oriented to person, place, and time. No cranial nerve deficit. Abnormal gait: Unsteady gait.   Skin: Skin is warm and dry.  Psychiatric: He has a normal mood and affect. His speech is normal and behavior is normal. Judgment and thought content normal.  Vitals reviewed.   BP 136/84 mmHg  Pulse 68  Temp(Src) 96.9 F (36.1 C) (Oral)  Ht _0  (1.651 m)  Wt 166 lb (75.297 kg)  BMI 27.62 kg/m2        Assessment & Plan:  1. Chronic atrial fibrillation (HCC) Continue follow up  2. 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 Gastroesophageal reflux disease, esophagitis presence not specified Avoid spicy foods Do not eat 2 hours prior to bedtime   4. Hypothyroidism due to acquired atrophy of thyroid - TSH drawn  5. Abnormality of gait Fall precautions  6. Hyperlipidemia with target LDL less than 100 Low fat diet - CMP14+EGFR - Lipid panel  7. BMI 27.0-27.9,adult Discussed diet and exercise for person with BMI >25 Will recheck weight in 3-6 months   8. Screening for malignant neoplasm of the rectum - Fecal occult blood, imunochemical; Future  9. Elevated PSA Will send results to urologist - PSA, total and free    Labs pending Health maintenance reviewed Diet and exercise encouraged Continue all meds Follow up  In 3 months   [place], FNP

## 2015-09-07 LAB — PSA, TOTAL AND FREE
PROSTATE SPECIFIC AG, SERUM: 5.2 ng/mL — AB (ref 0.0–4.0)
PSA FREE: 1.17 ng/mL
PSA, Free Pct: 22.5 %

## 2015-09-07 LAB — CMP14+EGFR
ALBUMIN: 4.4 g/dL (ref 3.5–4.8)
ALT: 14 IU/L (ref 0–44)
AST: 24 IU/L (ref 0–40)
Albumin/Globulin Ratio: 1.6 (ref 1.1–2.5)
Alkaline Phosphatase: 57 IU/L (ref 39–117)
BILIRUBIN TOTAL: 0.5 mg/dL (ref 0.0–1.2)
BUN / CREAT RATIO: 16 (ref 10–22)
BUN: 13 mg/dL (ref 8–27)
CALCIUM: 9.7 mg/dL (ref 8.6–10.2)
CHLORIDE: 102 mmol/L (ref 96–106)
CO2: 26 mmol/L (ref 18–29)
CREATININE: 0.8 mg/dL (ref 0.76–1.27)
GFR, EST AFRICAN AMERICAN: 99 mL/min/{1.73_m2} (ref >59)
GFR, EST NON AFRICAN AMERICAN: 86 mL/min/{1.73_m2} (ref >59)
GLUCOSE: 101 mg/dL — AB (ref 65–99)
Globulin, Total: 2.7 g/dL (ref 1.5–4.5)
Potassium: 4.7 mmol/L (ref 3.5–5.2)
Sodium: 142 mmol/L (ref 134–144)
TOTAL PROTEIN: 7.1 g/dL (ref 6.0–8.5)

## 2015-09-07 LAB — LIPID PANEL
Chol/HDL Ratio: 6.5 ratio units — ABNORMAL HIGH (ref 0.0–5.0)
Cholesterol, Total: 213 mg/dL — ABNORMAL HIGH (ref 100–199)
HDL: 33 mg/dL — AB (ref >39)
LDL CALC: 149 mg/dL — AB (ref 0–99)
Triglycerides: 155 mg/dL — ABNORMAL HIGH (ref 0–149)
VLDL CHOLESTEROL CAL: 31 mg/dL (ref 5–40)

## 2015-09-07 LAB — THYROID PANEL WITH TSH
Free Thyroxine Index: 2.4 (ref 1.2–4.9)
T3 Uptake Ratio: 34 % (ref 24–39)
T4 TOTAL: 7 ug/dL (ref 4.5–12.0)
TSH: 1.8 u[IU]/mL (ref 0.450–4.500)

## 2015-09-11 ENCOUNTER — Other Ambulatory Visit: Payer: Medicare Other

## 2015-09-11 DIAGNOSIS — Z1212 Encounter for screening for malignant neoplasm of rectum: Secondary | ICD-10-CM

## 2015-09-11 NOTE — Progress Notes (Signed)
Lab only 

## 2015-09-13 LAB — FECAL OCCULT BLOOD, IMMUNOCHEMICAL: FECAL OCCULT BLD: NEGATIVE

## 2015-10-03 ENCOUNTER — Encounter: Payer: Self-pay | Admitting: Internal Medicine

## 2015-10-14 DIAGNOSIS — R972 Elevated prostate specific antigen [PSA]: Secondary | ICD-10-CM | POA: Diagnosis not present

## 2015-10-14 DIAGNOSIS — Z0000 Encounter for general adult medical examination without abnormal findings: Secondary | ICD-10-CM | POA: Diagnosis not present

## 2015-10-14 DIAGNOSIS — N3941 Urge incontinence: Secondary | ICD-10-CM | POA: Diagnosis not present

## 2015-10-14 DIAGNOSIS — N403 Nodular prostate with lower urinary tract symptoms: Secondary | ICD-10-CM | POA: Diagnosis not present

## 2015-10-25 ENCOUNTER — Ambulatory Visit (INDEPENDENT_AMBULATORY_CARE_PROVIDER_SITE_OTHER): Payer: Medicare Other | Admitting: Nurse Practitioner

## 2015-10-25 ENCOUNTER — Encounter: Payer: Self-pay | Admitting: Nurse Practitioner

## 2015-10-25 VITALS — BP 136/72 | HR 73 | Temp 97.3°F | Ht 65.0 in | Wt 164.0 lb

## 2015-10-25 DIAGNOSIS — N3090 Cystitis, unspecified without hematuria: Secondary | ICD-10-CM | POA: Diagnosis not present

## 2015-10-25 DIAGNOSIS — R300 Dysuria: Secondary | ICD-10-CM | POA: Diagnosis not present

## 2015-10-25 LAB — URINALYSIS
Bilirubin, UA: NEGATIVE
Glucose, UA: NEGATIVE
Ketones, UA: NEGATIVE
NITRITE UA: NEGATIVE
RBC, UA: NEGATIVE
Specific Gravity, UA: 1.02 (ref 1.005–1.030)
Urobilinogen, Ur: 0.2 mg/dL (ref 0.2–1.0)
pH, UA: 6.5 (ref 5.0–7.5)

## 2015-10-25 MED ORDER — CIPROFLOXACIN HCL 500 MG PO TABS: 500.0000 mg | ORAL_TABLET | Freq: Two times a day (BID) | ORAL | Status: DC

## 2015-10-25 NOTE — Progress Notes (Signed)
   Subjective:    Patient ID: Sergio Baker, male    DOB: 23-Oct-1934, 82 y.o.   MRN: [id number]  HPI Patient in c/o dysuria and frequency- stated 4 days ago and has gotten worse.    Review of Systems  Constitutional: Negative.   HENT: Negative.   Respiratory: Negative.   Cardiovascular: Negative.   Genitourinary: Positive for dysuria, urgency and frequency.  Neurological: Negative.   Psychiatric/Behavioral: Negative.   All other systems reviewed and are negative.      Objective:   Physical Exam  Constitutional: He is oriented to person, place, and time. He appears well-developed and well-nourished. No distress.  Cardiovascular: Normal rate, regular rhythm and normal heart sounds.   Pulmonary/Chest: Effort normal and breath sounds normal.  Abdominal: Soft. Bowel sounds are normal. There is no tenderness.  Genitourinary:  No CVA tenderness  Neurological: He is alert and oriented to person, place, and time.  Skin: Skin is warm.  Psychiatric: He has a normal mood and affect. His behavior is normal. Judgment and thought content normal.   BP 136/72 mmHg  Pulse 73  Temp(Src) 97.3 F (36.3 C) (Oral)  Ht 5' 5"  (1.651 m)  Wt 164 lb (74.39 kg)  BMI 27.29 kg/m2  Urinalysis- (+) leuks      Assessment & Plan:  1. Dysuria - Urinalysis  2. Cystitis Take medication as prescribe Cotton underwear Take shower not bath Cranberry juice, yogurt Force fluids AZO over the counter X2 days Culture pending RTO prn  Mary-Margaret Hassell Done, FNP  - ciprofloxacin (CIPRO) 500 MG tablet; Take 1 tablet (500 mg total) by mouth 2 (two) times daily.  Dispense: 20 tablet; Refill: 0

## 2015-10-28 LAB — URINE CULTURE

## 2015-10-31 DIAGNOSIS — Z0000 Encounter for general adult medical examination without abnormal findings: Secondary | ICD-10-CM | POA: Diagnosis not present

## 2015-10-31 DIAGNOSIS — R310 Gross hematuria: Secondary | ICD-10-CM | POA: Diagnosis not present

## 2015-12-11 ENCOUNTER — Encounter: Payer: Self-pay | Admitting: Nurse Practitioner

## 2015-12-11 ENCOUNTER — Ambulatory Visit (INDEPENDENT_AMBULATORY_CARE_PROVIDER_SITE_OTHER): Payer: Medicare Other | Admitting: Nurse Practitioner

## 2015-12-11 VITALS — BP 135/84 | HR 68 | Temp 97.5°F | Ht 65.0 in | Wt 163.0 lb

## 2015-12-11 DIAGNOSIS — K5120 Ulcerative (chronic) proctitis without complications: Secondary | ICD-10-CM | POA: Diagnosis not present

## 2015-12-11 DIAGNOSIS — Z6827 Body mass index (BMI) 27.0-27.9, adult: Secondary | ICD-10-CM | POA: Diagnosis not present

## 2015-12-11 DIAGNOSIS — G10 Huntington's disease: Secondary | ICD-10-CM | POA: Diagnosis not present

## 2015-12-11 DIAGNOSIS — F329 Major depressive disorder, single episode, unspecified: Secondary | ICD-10-CM

## 2015-12-11 DIAGNOSIS — E785 Hyperlipidemia, unspecified: Secondary | ICD-10-CM

## 2015-12-11 DIAGNOSIS — F32A Depression, unspecified: Secondary | ICD-10-CM

## 2015-12-11 DIAGNOSIS — E038 Other specified hypothyroidism: Secondary | ICD-10-CM | POA: Diagnosis not present

## 2015-12-11 DIAGNOSIS — I482 Chronic atrial fibrillation, unspecified: Secondary | ICD-10-CM

## 2015-12-11 DIAGNOSIS — E034 Atrophy of thyroid (acquired): Secondary | ICD-10-CM | POA: Diagnosis not present

## 2015-12-11 DIAGNOSIS — K219 Gastro-esophageal reflux disease without esophagitis: Secondary | ICD-10-CM

## 2015-12-11 LAB — CMP14+EGFR
A/G RATIO: 1.8 (ref 1.2–2.2)
ALT: 13 IU/L (ref 0–44)
AST: 18 IU/L (ref 0–40)
Albumin: 4.5 g/dL (ref 3.5–4.8)
Alkaline Phosphatase: 62 IU/L (ref 39–117)
BILIRUBIN TOTAL: 0.6 mg/dL (ref 0.0–1.2)
BUN/Creatinine Ratio: 14 (ref 10–24)
BUN: 13 mg/dL (ref 8–27)
CALCIUM: 9.9 mg/dL (ref 8.6–10.2)
CHLORIDE: 102 mmol/L (ref 96–106)
CO2: 25 mmol/L (ref 18–29)
Creatinine, Ser: 0.92 mg/dL (ref 0.76–1.27)
GFR calc Af Amer: 92 mL/min/{1.73_m2} (ref >59)
GFR, EST NON AFRICAN AMERICAN: 79 mL/min/{1.73_m2} (ref >59)
GLUCOSE: 109 mg/dL — AB (ref 65–99)
Globulin, Total: 2.5 g/dL (ref 1.5–4.5)
POTASSIUM: 4.8 mmol/L (ref 3.5–5.2)
Sodium: 144 mmol/L (ref 134–144)
Total Protein: 7 g/dL (ref 6.0–8.5)

## 2015-12-11 LAB — LIPID PANEL
Chol/HDL Ratio: 6.1 ratio units — ABNORMAL HIGH (ref 0.0–5.0)
Cholesterol, Total: 226 mg/dL — ABNORMAL HIGH (ref 100–199)
HDL: 37 mg/dL — AB (ref >39)
LDL Calculated: 159 mg/dL — ABNORMAL HIGH (ref 0–99)
TRIGLYCERIDES: 151 mg/dL — AB (ref 0–149)
VLDL CHOLESTEROL CAL: 30 mg/dL (ref 5–40)

## 2015-12-11 MED ORDER — HALOPERIDOL 1 MG PO TABS: 1.5000 mg | ORAL_TABLET | Freq: Two times a day (BID) | ORAL | Status: DC

## 2015-12-11 MED ORDER — CITALOPRAM HYDROBROMIDE 20 MG PO TABS: ORAL_TABLET | ORAL | Status: DC

## 2015-12-11 MED ORDER — LEVOTHYROXINE SODIUM 50 MCG PO TABS: ORAL_TABLET | ORAL | Status: DC

## 2015-12-11 MED ORDER — OMEPRAZOLE 20 MG PO CPDR: DELAYED_RELEASE_CAPSULE | ORAL | Status: DC

## 2015-12-11 NOTE — Progress Notes (Signed)
Subjective:    Patient ID: Sergio Baker, male    DOB: 10/18/1934, 81 y.o.   MRN: [id number]   Patient here today for follow up of chronic medical problems.  Outpatient Encounter Prescriptions as of 12/11/2015  Medication Sig  . aspirin EC 81 MG tablet Take 81 mg by mouth daily.  Raelyn Ensign Pollen 550 MG CAPS Take 1 tablet by mouth.  . Calcium Carbonate-Vitamin D (CALCIUM 600/VITAMIN D PO) Take 1 tablet by mouth daily.  . Cholecalciferol (VITAMIN D3) 2000 UNITS capsule Take 2,000 Units by mouth daily.  . ciprofloxacin (CIPRO) 500 MG tablet Take 1 tablet (500 mg total) by mouth 2 (two) times daily.  . citalopram (CELEXA) 20 MG tablet TAKE ONE (1) TABLET EACH DAY  . glucosamine-chondroitin 500-400 MG tablet Take 1 tablet by mouth daily.  . haloperidol (HALDOL) 1 MG tablet Take 1.5 tablets (1.5 mg total) by mouth 2 (two) times daily.  Marland Kitchen levothyroxine (SYNTHROID, LEVOTHROID) 50 MCG tablet TAKE ONE (1) TABLET EACH DAY  . Multiple Vitamin (MULTIVITAMIN WITH MINERALS) TABS Take 1 tablet by mouth daily.  . Omega-3 Fatty Acids 1200 MG CAPS Take 1,200 capsules by mouth daily.  Marland Kitchen omeprazole (PRILOSEC) 20 MG capsule TAKE ONE (1) CAPSULE EACH DAY   No facility-administered encounter medications on file as of 12/11/2015.   * doing well - no changes since last visit  Atrial Fibrillation Presents for follow-up visit. Symptoms are negative for chest pain, shortness of breath and weakness. Past medical history includes atrial fibrillation and hyperlipidemia.  Hyperlipidemia This is a chronic problem. The current episode started more than 1 year ago. Recent lipid tests were reviewed and are variable. Pertinent negatives include no chest pain or shortness of breath. Current antihyperlipidemic treatment includes statins. The current treatment provides moderate improvement of lipids. Compliance problems include adherence to diet and adherence to exercise.  Risk factors for coronary artery disease include  dyslipidemia and male sex.   Huntington's/ gait abnormality No much change since last visit-Was encouraged to use a cane as gait has been more unsteady but has not felt the need to use it as he has not fallen.  Hypothyroidism  Currnetly on levothyroxin 50 mcg- doing well- no c/o fatigue Depression Currently on celexa which he says is working well without side effects. Hyperlipidemia  Patient has had for >10 years. He is currently taking crestor without any problems. He is trying to watch diet. GERD Omeprazole daily keeps symptoms under control Ulcerative colitis Has had recent flare up and was put on lialda which is helping Atrial fib currently on no meds      Review of Systems  Constitutional: Negative.   HENT: Negative.   Respiratory: Negative.  Negative for chest tightness and shortness of breath.   Cardiovascular: Negative.  Negative for chest pain.  Gastrointestinal: Negative for nausea and abdominal pain.  Genitourinary: Negative.   Musculoskeletal: Positive for arthralgias and gait problem (Some unsteadiness, denies falls. ). Negative for joint swelling and neck stiffness.  Neurological: Negative.  Negative for tremors, syncope and weakness.  Psychiatric/Behavioral: Negative.   All other systems reviewed and are negative.      Objective:   Physical Exam  Constitutional: He is oriented to person, place, and time. He appears well-developed and well-nourished.  HENT:  Head: Normocephalic.  Right Ear: External ear normal.  Left Ear: External ear normal.  Nose: Nose normal.  Mouth/Throat: Oropharynx is clear and moist.  Eyes: EOM are normal. Pupils are equal, round, and  reactive to light.  Neck: Normal range of motion. Neck supple. No JVD present. No thyromegaly present.  Cardiovascular: Normal rate, normal heart sounds and intact distal pulses.  Exam reveals no gallop and no friction rub.   No murmur heard. Irregular heart rate   Pulmonary/Chest: Effort normal and  breath sounds normal. No respiratory distress. He has no wheezes. He has no rales. He exhibits no tenderness.  Abdominal: Soft. Bowel sounds are normal. He exhibits no mass. There is no tenderness.  Genitourinary: Prostate normal and penis normal.  Musculoskeletal: Normal range of motion. He exhibits no edema.  Constant jerking movements. Gait slightly unsteady.  Lymphadenopathy:    He has no cervical adenopathy.  Neurological: He is alert and oriented to person, place, and time. No cranial nerve deficit. Abnormal gait: Unsteady gait.   Skin: Skin is warm and dry.  Psychiatric: He has a normal mood and affect. His speech is normal and behavior is normal. Judgment and thought content normal.  Vitals reviewed.   BP 135/84 mmHg  Pulse 68  Temp(Src) 97.5 F (36.4 C) (Oral)  Ht 5' 5"  (1.651 m)  Wt 163 lb (73.936 kg)  BMI 27.12 kg/m2        Assessment & Plan:  1. Chronic atrial fibrillation (HCC) Avoid caffeine  2. 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 Gastroesophageal reflux disease, esophagitis presence not specified Avoid spicy foods Do not eat 2 hours prior to bedtime - omeprazole (PRILOSEC) 20 MG capsule; TAKE ONE (1) CAPSULE EACH DAY  Dispense: 90 capsule; Refill: 1  4. Hypothyroidism due to acquired atrophy of thyroid - levothyroxine (SYNTHROID, LEVOTHROID) 50 MCG tablet; TAKE ONE (1) TABLET EACH DAY  Dispense: 90 tablet; Refill: 3  5. 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  Dispense: 90 tablet; Refill: 3  6. Hyperlipidemia with target LDL less than 100 Low fat diet - CMP14+EGFR - Lipid panel  7. Depression Stress management - citalopram (CELEXA) 20 MG tablet; TAKE ONE (1) TABLET EACH DAY  Dispense: 30 tablet; Refill: 5  8. BMI 27.0-27.9,adult Discussed diet and exercise for person with BMI >25 Will recheck weight in 3-6  months    Labs pending Health maintenance reviewed Diet and exercise encouraged Continue all meds Follow up  In 3 month   [place], FNP

## 2015-12-11 NOTE — Patient Instructions (Signed)
Fall Prevention in the Home  Falls can cause injuries and can affect people from all age groups. There are many simple things that you can do to make your home safe and to help prevent falls. WHAT CAN I DO ON THE OUTSIDE OF MY HOME?  Regularly repair the edges of walkways and driveways and fix any cracks.  Remove high doorway thresholds.  Trim any shrubbery on the main path into your home.  Use bright outdoor lighting.  Clear walkways of debris and clutter, including tools and rocks.  Regularly check that handrails are securely fastened and in good repair. Both sides of any steps should have handrails.  Install guardrails along the edges of any raised decks or porches.  Have leaves, snow, and ice cleared regularly.  Use sand or salt on walkways during winter months.  In the garage, clean up any spills right away, including grease or oil spills. WHAT CAN I DO IN THE BATHROOM?  Use night lights.  Install grab bars by the toilet and in the tub and shower. Do not use towel bars as grab bars.  Use non-skid mats or decals on the floor of the tub or shower.  If you need to sit down while you are in the shower, use a plastic, non-slip stool..  Keep the floor dry. Immediately clean up any water that spills on the floor.  Remove soap buildup in the tub or shower on a regular basis.  Attach bath mats securely with double-sided non-slip rug tape.  Remove throw rugs and other tripping hazards from the floor. WHAT CAN I DO IN THE BEDROOM?  Use night lights.  Make sure that a bedside light is easy to reach.  Do not use oversized bedding that drapes onto the floor.  Have a firm chair that has side arms to use for getting dressed.  Remove throw rugs and other tripping hazards from the floor. WHAT CAN I DO IN THE KITCHEN?   Clean up any spills right away.  Avoid walking on wet floors.  Place frequently used items in easy-to-reach places.  If you need to reach for something  above you, use a sturdy step stool that has a grab bar.  Keep electrical cables out of the way.  Do not use floor polish or wax that makes floors slippery. If you have to use wax, make sure that it is non-skid floor wax.  Remove throw rugs and other tripping hazards from the floor. WHAT CAN I DO IN THE STAIRWAYS?  Do not leave any items on the stairs.  Make sure that there are handrails on both sides of the stairs. Fix handrails that are broken or loose. Make sure that handrails are as long as the stairways.  Check any carpeting to make sure that it is firmly attached to the stairs. Fix any carpet that is loose or worn.  Avoid having throw rugs at the top or bottom of stairways, or secure the rugs with carpet tape to prevent them from moving.  Make sure that you have a light switch at the top of the stairs and the bottom of the stairs. If you do not have them, have them installed. WHAT ARE SOME OTHER FALL PREVENTION TIPS?  Wear closed-toe shoes that fit well and support your feet. Wear shoes that have rubber soles or low heels.  When you use a stepladder, make sure that it is completely opened and that the sides are firmly locked. Have someone hold the ladder while you   are using it. Do not climb a closed stepladder.  Add color or contrast paint or tape to grab bars and handrails in your home. Place contrasting color strips on the first and last steps.  Use mobility aids as needed, such as canes, walkers, scooters, and crutches.  Turn on lights if it is dark. Replace any light bulbs that burn out.  Set up furniture so that there are clear paths. Keep the furniture in the same spot.  Fix any uneven floor surfaces.  Choose a carpet design that does not hide the edge of steps of a stairway.  Be aware of any and all pets.  Review your medicines with your healthcare provider. Some medicines can cause dizziness or changes in blood pressure, which increase your risk of falling. Talk  with your health care provider about other ways that you can decrease your risk of falls. This may include working with a physical therapist or trainer to improve your strength, balance, and endurance.   This information is not intended to replace advice given to you by your health care provider. Make sure you discuss any questions you have with your health care provider.   Document Released: 07/24/2002 Document Revised: 12/18/2014 Document Reviewed: 09/07/2014 Elsevier Interactive Patient Education 2016 [employer]  

## 2015-12-23 ENCOUNTER — Encounter: Payer: Self-pay | Admitting: Family Medicine

## 2015-12-23 ENCOUNTER — Ambulatory Visit (INDEPENDENT_AMBULATORY_CARE_PROVIDER_SITE_OTHER): Payer: Medicare Other | Admitting: Family Medicine

## 2015-12-23 VITALS — BP 147/78 | HR 74 | Temp 98.6°F | Ht 65.0 in | Wt 164.2 lb

## 2015-12-23 DIAGNOSIS — R300 Dysuria: Secondary | ICD-10-CM | POA: Insufficient documentation

## 2015-12-23 DIAGNOSIS — N400 Enlarged prostate without lower urinary tract symptoms: Secondary | ICD-10-CM

## 2015-12-23 DIAGNOSIS — N410 Acute prostatitis: Secondary | ICD-10-CM | POA: Diagnosis not present

## 2015-12-23 LAB — URINALYSIS, COMPLETE
BILIRUBIN UA: NEGATIVE
GLUCOSE, UA: NEGATIVE
Ketones, UA: NEGATIVE
Nitrite, UA: NEGATIVE
PH UA: 6 (ref 5.0–7.5)
SPEC GRAV UA: 1.02 (ref 1.005–1.030)
UUROB: 0.2 mg/dL (ref 0.2–1.0)

## 2015-12-23 LAB — MICROSCOPIC EXAMINATION

## 2015-12-23 MED ORDER — CIPROFLOXACIN HCL 500 MG PO TABS: 500.0000 mg | ORAL_TABLET | Freq: Two times a day (BID) | ORAL | Status: DC

## 2015-12-23 NOTE — Progress Notes (Signed)
Subjective:  Patient ID: Sergio Baker, male    DOB: 04/26/1935  Age: 81 y.o. MRN: [id number]  CC: Urinary Tract Infection   HPI Sergio Baker presents for recurrent frequency and dysuria. Rather severe dyscomfort. Running to the bathroom all night. Tx one month ago with cipro. Sx returned last week. Hx of prostate enlgmt. No Ca prostate. Denies fever, nausea, flank pain.   History Sergio Baker has a past medical history of Proctocolitis, ulcerative ([place]); GERD (gastroesophageal reflux disease); A-fib ([place]); Hemorrhoids; Diverticula of colon; Hiatal hernia; Schatzki's ring; Ulcerative colitis ([place]); Huntington's disease ([place]) (2010); Depression; Arthritis; Enlarged prostate; Joint pain; Hearing loss; Abnormality of gait; Hyperlipidemia; Memory deficits (01/17/2014); and HOH (hard of hearing).   He has past surgical history that includes Appendectomy; Tonsillectomy; cyst removed from urinary bladder; Colonoscopy (02/2010); Esophagogastroduodenoscopy (04/2008); Colonoscopy (03/24/2012); Shoulder arthroscopy w/ rotator cuff repair (Bilateral); Lumbar fusion; and Colonoscopy (N/A, 10/05/2014).   His family history includes Arthritis in his sister; Cancer in his brother and sister; Huntington's disease in his daughter; Lung cancer (age of onset: 75) in his mother; Other in his brother; SIDS in his brother. There is no history of Colon cancer.He reports that he has quit smoking. His smoking use included Cigarettes. He smoked 0.25 packs per day. He has never used smokeless tobacco. He reports that he does not drink alcohol or use illicit drugs.  Current Outpatient Prescriptions on File Prior to Visit  Medication Sig Dispense Refill  . aspirin EC 81 MG tablet Take 81 mg by mouth daily.    Sergio Baker 550 MG CAPS Take 1 tablet by mouth.    . Calcium Carbonate-Vitamin D (CALCIUM 600/VITAMIN D PO) Take 1 tablet by mouth daily.    . Cholecalciferol (VITAMIN D3) 2000 UNITS capsule Take 2,000 Units by mouth daily.     . citalopram (CELEXA) 20 MG tablet TAKE ONE (1) TABLET EACH DAY 30 tablet 5  . glucosamine-chondroitin 500-400 MG tablet Take 1 tablet by mouth daily.    . haloperidol (HALDOL) 1 MG tablet Take 1.5 tablets (1.5 mg total) by mouth 2 (two) times daily. 90 tablet 3  . levothyroxine (SYNTHROID, LEVOTHROID) 50 MCG tablet TAKE ONE (1) TABLET EACH DAY 90 tablet 3  . Multiple Vitamin (MULTIVITAMIN WITH MINERALS) TABS Take 1 tablet by mouth daily.    . Omega-3 Fatty Acids 1200 MG CAPS Take 1,200 capsules by mouth daily.    Marland Kitchen omeprazole (PRILOSEC) 20 MG capsule TAKE ONE (1) CAPSULE EACH DAY 90 capsule 1   No current facility-administered medications on file prior to visit.    ROS Review of Systems  Constitutional: Negative for fever, chills, diaphoresis and unexpected weight change.  HENT: Negative for rhinorrhea and trouble swallowing.   Respiratory: Negative for cough, chest tightness and shortness of breath.   Cardiovascular: Negative for chest pain.  Gastrointestinal: Negative for nausea, vomiting, abdominal pain, diarrhea, constipation, blood in stool, abdominal distention and rectal pain.  Genitourinary: Positive for dysuria, frequency, hematuria and difficulty urinating. Negative for flank pain.  Musculoskeletal: Negative for joint swelling and arthralgias.  Skin: Negative for rash.  Neurological: Negative for syncope and headaches.    Objective:  BP 147/78 mmHg  Pulse 74  Temp(Src) 98.6 F (37 C) (Oral)  Ht 5' 5"  (1.651 m)  Wt 164 lb 3.2 oz (74.481 kg)  BMI 27.32 kg/m2  SpO2 96%  Physical Exam  Constitutional: He appears well-developed and well-nourished.  HENT:  Head: Normocephalic and atraumatic.  Right Ear: Tympanic membrane  and external ear normal. No decreased hearing is noted.  Left Ear: Tympanic membrane and external ear normal. No decreased hearing is noted.  Mouth/Throat: No oropharyngeal exudate or posterior oropharyngeal erythema.  Eyes: Pupils are equal, round,  and reactive to light.  Neck: Normal range of motion. Neck supple.  Cardiovascular: Normal rate and regular rhythm.   No murmur heard. Pulmonary/Chest: Breath sounds normal. No respiratory distress.  Abdominal: Soft. Bowel sounds are normal. He exhibits no mass. There is no tenderness.  Vitals reviewed.   Assessment & Plan:   Sergio Baker was seen today for urinary tract infection.  Diagnoses and all orders for this visit:  Dysuria -     Urinalysis, Complete  Acute prostatitis  BPH (benign prostatic hyperplasia)  Other orders -     ciprofloxacin (CIPRO) 500 MG tablet; Take 1 tablet (500 mg total) by mouth 2 (two) times daily. For 30 days   I am having [name] start on ciprofloxacin. I am also having him maintain his multivitamin with minerals, Omega-3 Fatty Acids, glucosamine-chondroitin, Calcium Carbonate-Vitamin D (CALCIUM 600/VITAMIN D PO), Bee Baker, Vitamin D3, aspirin EC, citalopram, omeprazole, levothyroxine, and haloperidol.  Meds ordered this encounter  Medications  . ciprofloxacin (CIPRO) 500 MG tablet    Sig: Take 1 tablet (500 mg total) by mouth 2 (two) times daily. For 30 days    Dispense:  60 tablet    Refill:  0   30 day tx due to early recurrence and hx of BPH  Follow-up: Return if symptoms worsen or fail to improve.  Claretta Fraise, M.D.

## 2015-12-26 DIAGNOSIS — M5126 Other intervertebral disc displacement, lumbar region: Secondary | ICD-10-CM | POA: Diagnosis not present

## 2015-12-26 DIAGNOSIS — M4727 Other spondylosis with radiculopathy, lumbosacral region: Secondary | ICD-10-CM | POA: Diagnosis not present

## 2015-12-26 DIAGNOSIS — B379 Candidiasis, unspecified: Secondary | ICD-10-CM | POA: Diagnosis not present

## 2016-02-06 DIAGNOSIS — L218 Other seborrheic dermatitis: Secondary | ICD-10-CM | POA: Diagnosis not present

## 2016-02-06 DIAGNOSIS — L723 Sebaceous cyst: Secondary | ICD-10-CM | POA: Diagnosis not present

## 2016-02-06 DIAGNOSIS — L738 Other specified follicular disorders: Secondary | ICD-10-CM | POA: Diagnosis not present

## 2016-02-06 DIAGNOSIS — L821 Other seborrheic keratosis: Secondary | ICD-10-CM | POA: Diagnosis not present

## 2016-02-07 DIAGNOSIS — M7122 Synovial cyst of popliteal space [Baker], left knee: Secondary | ICD-10-CM | POA: Diagnosis not present

## 2016-02-10 DIAGNOSIS — M7122 Synovial cyst of popliteal space [Baker], left knee: Secondary | ICD-10-CM | POA: Diagnosis not present

## 2016-02-10 DIAGNOSIS — M79662 Pain in left lower leg: Secondary | ICD-10-CM | POA: Diagnosis not present

## 2016-02-10 DIAGNOSIS — M7989 Other specified soft tissue disorders: Secondary | ICD-10-CM | POA: Diagnosis not present

## 2016-02-14 ENCOUNTER — Ambulatory Visit (INDEPENDENT_AMBULATORY_CARE_PROVIDER_SITE_OTHER): Payer: Medicare Other | Admitting: Nurse Practitioner

## 2016-02-14 ENCOUNTER — Telehealth: Payer: Self-pay | Admitting: Nurse Practitioner

## 2016-02-14 ENCOUNTER — Encounter: Payer: Self-pay | Admitting: Nurse Practitioner

## 2016-02-14 VITALS — BP 140/76 | HR 65 | Temp 97.0°F | Ht 65.0 in | Wt 165.6 lb

## 2016-02-14 DIAGNOSIS — M7122 Synovial cyst of popliteal space [Baker], left knee: Secondary | ICD-10-CM | POA: Diagnosis not present

## 2016-02-14 DIAGNOSIS — R350 Frequency of micturition: Secondary | ICD-10-CM | POA: Diagnosis not present

## 2016-02-14 LAB — MICROSCOPIC EXAMINATION
Bacteria, UA: NONE SEEN
Epithelial Cells (non renal): NONE SEEN /hpf (ref 0–10)

## 2016-02-14 LAB — URINALYSIS, COMPLETE
BILIRUBIN UA: NEGATIVE
Glucose, UA: NEGATIVE
KETONES UA: NEGATIVE
LEUKOCYTES UA: NEGATIVE
NITRITE UA: NEGATIVE
Protein, UA: NEGATIVE
RBC UA: NEGATIVE
SPEC GRAV UA: 1.02 (ref 1.005–1.030)
UUROB: 0.2 mg/dL (ref 0.2–1.0)
pH, UA: 5 (ref 5.0–7.5)

## 2016-02-14 NOTE — Progress Notes (Signed)
   Subjective:    Patient ID: Sergio Baker, male    DOB: 13-Jun-1935, 81 y.o.   MRN: [id number]  HPI Patient in c/o urinary symptoms- says that he is having urinary frequency, which is worse at night- slight suprapubic pain. He saw Dr. Livia Snellen on 12/23/15 with the same symptoms and was given cipro which helped. He sees Dr. Jeffie Pollock in a couple of months. Denies trouble getting stream going.   Review of Systems  Constitutional: Negative.   HENT: Negative.   Respiratory: Negative.   Cardiovascular: Negative.   Genitourinary: Positive for dysuria, urgency and frequency. Negative for hematuria, scrotal swelling, penile pain and testicular pain.  All other systems reviewed and are negative.      Objective:   Physical Exam  Constitutional: He appears well-developed and well-nourished.  Cardiovascular: Normal rate.   Pulmonary/Chest: Effort normal and breath sounds normal.  Abdominal: Soft. Bowel sounds are normal. There is tenderness (mild suprapubic pain on palpation).  Genitourinary:  No CVA tnederness   BP 140/76 mmHg  Pulse 65  Temp(Src) 97 F (36.1 C) (Oral)  Ht 5' 5"  (1.651 m)  Wt 165 lb 9.6 oz (75.116 kg)  BMI 27.56 kg/m2  Urine clear dip       Assessment & Plan:   1. Urinary frequency    Orders Placed This Encounter  Procedures  . Urinalysis, Complete  . PSA, total and free  . Ambulatory referral to Urology    Referral Priority:  Routine    Referral Type:  Consultation    Referral Reason:  Specialty Services Required    Referred to Provider:  Irine Seal, MD    Requested Specialty:  Urology    Number of Visits Requested:  1   Force fluids  Mary-Margaret Hassell Done, FNP

## 2016-02-15 LAB — PSA, TOTAL AND FREE
PSA FREE PCT: 15.6 %
PSA, Free: 1.76 ng/mL
Prostate Specific Ag, Serum: 11.3 ng/mL — ABNORMAL HIGH (ref 0.0–4.0)

## 2016-02-19 NOTE — Telephone Encounter (Signed)
Labs and notes faxed to 02/19/16

## 2016-02-21 DIAGNOSIS — R972 Elevated prostate specific antigen [PSA]: Secondary | ICD-10-CM | POA: Diagnosis not present

## 2016-02-21 DIAGNOSIS — R9721 Rising PSA following treatment for malignant neoplasm of prostate: Secondary | ICD-10-CM | POA: Diagnosis not present

## 2016-02-25 ENCOUNTER — Telehealth: Payer: Self-pay | Admitting: Nurse Practitioner

## 2016-02-25 DIAGNOSIS — R972 Elevated prostate specific antigen [PSA]: Secondary | ICD-10-CM

## 2016-02-26 ENCOUNTER — Encounter: Payer: Self-pay | Admitting: Neurology

## 2016-02-26 ENCOUNTER — Ambulatory Visit (INDEPENDENT_AMBULATORY_CARE_PROVIDER_SITE_OTHER): Payer: Medicare Other | Admitting: Neurology

## 2016-02-26 VITALS — BP 140/78 | HR 68 | Ht 65.0 in | Wt 163.0 lb

## 2016-02-26 DIAGNOSIS — G10 Huntington's disease: Secondary | ICD-10-CM | POA: Diagnosis not present

## 2016-02-26 DIAGNOSIS — R413 Other amnesia: Secondary | ICD-10-CM

## 2016-02-26 MED ORDER — HALOPERIDOL 1 MG PO TABS: 1.5000 mg | ORAL_TABLET | Freq: Two times a day (BID) | ORAL | Status: DC

## 2016-02-26 NOTE — Progress Notes (Signed)
Reason for visit: Huntington's disease  Sergio Baker is an 81 y.o. male  History of present illness:  Sergio Baker is a 81 year old right-handed white male with a history of Huntington's disease. The patient has a daughter who is more severely affected than he is. The patient has had some problems with choreiform movements, he has had a gait disorder, no recent falls. The patient is on Haldol taking 1.5 mg twice daily. He is tolerating the medication well. He denies any issues with swallowing or choking. He has not had any significant memory issues. He returns to this office for an evaluation. He recently had a Bakers cyst in the left knee.  Past Medical History  Diagnosis Date  . 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  Marland Kitchen GERD (gastroesophageal reflux disease)   . A-fib ([place])   . Hemorrhoids   . Diverticula of colon   . Hiatal hernia     small  . Schatzki's ring   . Ulcerative colitis ([place])     left side  . Huntington's disease ([place]) 2010  . Depression   . Arthritis   . Enlarged prostate   . Joint pain   . Hearing loss   . Abnormality of gait   . Hyperlipidemia   . Memory deficits 01/17/2014  . HOH (hard of hearing)     Past Surgical History  Procedure Laterality Date  . Appendectomy    . Tonsillectomy    . Cyst removed from urinary bladder    . Colonoscopy  02/2010    patchy erythema, minimal granularity in distal rectum, left-sided diverticula, ileal diverticulum.  Bx benign. next TCS 02/2012  . Esophagogastroduodenoscopy  04/2008    schatzki ring, s/p ED, hh  . Colonoscopy  03/24/2012    Dr. Gala Romney- inflammatory changes of the rectum and colon consistent with history of U/C. bx= benign colonic mucosa  . Shoulder arthroscopy w/ rotator cuff repair Bilateral   . Lumbar fusion    . Colonoscopy N/A 10/05/2014    RMR: abnormal rectal and sigmoid mucosa as described above status post segmental biopsy. active colitis sigmoid colon.    Family History    Problem Relation Age of Onset  . Lung cancer Mother 109    deceased  . Colon cancer Neg Hx   . Other Brother     Posttraumatic stress disorder  . Cancer Brother     skin cancer  . Huntington's disease Daughter   . Cancer Sister     thyroid  . Arthritis Sister   . SIDS Brother     Social history:  reports that he has quit smoking. His smoking use included Cigarettes. He smoked 0.25 packs per day. He has never used smokeless tobacco. He reports that he does not drink alcohol or use illicit drugs.   No Known Allergies  Medications:  Prior to Admission medications   Medication Sig Start Date End Date Taking? Authorizing Provider  aspirin EC 81 MG tablet Take 81 mg by mouth daily.   Yes Historical Provider, MD  Bee Pollen 550 MG CAPS Take 1 tablet by mouth.   Yes Historical Provider, MD  Calcium Carbonate-Vitamin D (CALCIUM 600/VITAMIN D PO) Take 1 tablet by mouth daily.   Yes Historical Provider, MD  Cholecalciferol (VITAMIN D3) 2000 UNITS capsule Take 2,000 Units by mouth daily.   Yes Historical Provider, MD  citalopram (CELEXA) 20 MG tablet TAKE ONE (1) TABLET EACH DAY 12/11/15  Yes Mary-Margaret Hassell Done,  FNP  glucosamine-chondroitin 500-400 MG tablet Take 1 tablet by mouth daily.   Yes Historical Provider, MD  haloperidol (HALDOL) 1 MG tablet Take 1.5 tablets (1.5 mg total) by mouth 2 (two) times daily. 02/26/16  Yes Kathrynn Ducking, MD  levothyroxine (SYNTHROID, LEVOTHROID) 50 MCG tablet TAKE ONE (1) TABLET EACH DAY 12/11/15  Yes Mary-Margaret Hassell Done, FNP  Multiple Vitamin (MULTIVITAMIN WITH MINERALS) TABS Take 1 tablet by mouth daily.   Yes Historical Provider, MD  Omega-3 Fatty Acids 1200 MG CAPS Take 1,200 capsules by mouth daily.   Yes Historical Provider, MD  omeprazole (PRILOSEC) 20 MG capsule TAKE ONE (1) CAPSULE EACH DAY 12/11/15  Yes Mary-Margaret Hassell Done, FNP    ROS:  Out of a complete 14 system review of symptoms, the patient complains only of the following symptoms, and  all other reviewed systems are negative.  Hearing loss Incontinence of the bladder, frequency of urination, urinary urgency Restless legs, daytime sleepiness, snoring Joint pain, joint swelling, walking difficulty Weakness Depression  Blood pressure 140/78, pulse 68, height 5' 5"  (1.651 m), weight 163 lb (73.936 kg).  Physical Exam  General: The patient is alert and cooperative at the time of the examination.  Skin: No significant peripheral edema is noted.   Neurologic Exam  Mental status: The patient is alert and oriented x 3 at the time of the examination. The patient has apparent normal recent and remote memory, with an apparently normal attention span and concentration ability. Mini-Mental Status Examination done today shows a score 29/30.   Cranial nerves: Facial symmetry is present. Speech is normal, no aphasia or dysarthria is noted. Extraocular movements are full. Visual fields are full.  Motor: The patient has good strength in all 4 extremities.  Sensory examination: Soft touch sensation is symmetric on the face, arms, and legs.  Coordination: The patient has good finger-nose-finger and heel-to-shin bilaterally. Frequent choreiform movements are noted of the arms and legs.  Gait and station: The patient has a wide-based gait, slightly unsteady. The patient is able to ambulate independently. Tandem gait was not attempted. Romberg is negative. No drift is seen.  Reflexes: Deep tendon reflexes are symmetric.   Assessment/Plan:  1. Huntington's disease  2. Gait disorder  3. Mild memory disorder  The patient will continue the Haldol at 1.5 mg twice daily dose. A prescription was called in for him. The patient will follow-up in 6 months, sooner if needed.  Jill Alexanders MD 02/26/2016 7:14 PM  [facility] [address] [place], [place] [address]  Phone [phone] Fax [phone]

## 2016-03-19 ENCOUNTER — Encounter: Payer: Self-pay | Admitting: Nurse Practitioner

## 2016-03-19 ENCOUNTER — Ambulatory Visit (INDEPENDENT_AMBULATORY_CARE_PROVIDER_SITE_OTHER): Payer: Medicare Other | Admitting: Nurse Practitioner

## 2016-03-19 VITALS — BP 136/74 | HR 65 | Temp 96.8°F | Ht 65.0 in | Wt 165.0 lb

## 2016-03-19 DIAGNOSIS — F329 Major depressive disorder, single episode, unspecified: Secondary | ICD-10-CM | POA: Diagnosis not present

## 2016-03-19 DIAGNOSIS — N400 Enlarged prostate without lower urinary tract symptoms: Secondary | ICD-10-CM

## 2016-03-19 DIAGNOSIS — G10 Huntington's disease: Secondary | ICD-10-CM

## 2016-03-19 DIAGNOSIS — R269 Unspecified abnormalities of gait and mobility: Secondary | ICD-10-CM | POA: Diagnosis not present

## 2016-03-19 DIAGNOSIS — K5120 Ulcerative (chronic) proctitis without complications: Secondary | ICD-10-CM

## 2016-03-19 DIAGNOSIS — Z6827 Body mass index (BMI) 27.0-27.9, adult: Secondary | ICD-10-CM | POA: Diagnosis not present

## 2016-03-19 DIAGNOSIS — I482 Chronic atrial fibrillation, unspecified: Secondary | ICD-10-CM

## 2016-03-19 DIAGNOSIS — E785 Hyperlipidemia, unspecified: Secondary | ICD-10-CM

## 2016-03-19 DIAGNOSIS — E038 Other specified hypothyroidism: Secondary | ICD-10-CM | POA: Diagnosis not present

## 2016-03-19 DIAGNOSIS — K5730 Diverticulosis of large intestine without perforation or abscess without bleeding: Secondary | ICD-10-CM

## 2016-03-19 DIAGNOSIS — F32A Depression, unspecified: Secondary | ICD-10-CM

## 2016-03-19 DIAGNOSIS — K219 Gastro-esophageal reflux disease without esophagitis: Secondary | ICD-10-CM

## 2016-03-19 DIAGNOSIS — E034 Atrophy of thyroid (acquired): Secondary | ICD-10-CM | POA: Diagnosis not present

## 2016-03-19 NOTE — Patient Instructions (Signed)
Fall Prevention in the Home  Falls can cause injuries and can affect people from all age groups. There are many simple things that you can do to make your home safe and to help prevent falls. WHAT CAN I DO ON THE OUTSIDE OF MY HOME?  Regularly repair the edges of walkways and driveways and fix any cracks.  Remove high doorway thresholds.  Trim any shrubbery on the main path into your home.  Use bright outdoor lighting.  Clear walkways of debris and clutter, including tools and rocks.  Regularly check that handrails are securely fastened and in good repair. Both sides of any steps should have handrails.  Install guardrails along the edges of any raised decks or porches.  Have leaves, snow, and ice cleared regularly.  Use sand or salt on walkways during winter months.  In the garage, clean up any spills right away, including grease or oil spills. WHAT CAN I DO IN THE BATHROOM?  Use night lights.  Install grab bars by the toilet and in the tub and shower. Do not use towel bars as grab bars.  Use non-skid mats or decals on the floor of the tub or shower.  If you need to sit down while you are in the shower, use a plastic, non-slip stool..  Keep the floor dry. Immediately clean up any water that spills on the floor.  Remove soap buildup in the tub or shower on a regular basis.  Attach bath mats securely with double-sided non-slip rug tape.  Remove throw rugs and other tripping hazards from the floor. WHAT CAN I DO IN THE BEDROOM?  Use night lights.  Make sure that a bedside light is easy to reach.  Do not use oversized bedding that drapes onto the floor.  Have a firm chair that has side arms to use for getting dressed.  Remove throw rugs and other tripping hazards from the floor. WHAT CAN I DO IN THE KITCHEN?   Clean up any spills right away.  Avoid walking on wet floors.  Place frequently used items in easy-to-reach places.  If you need to reach for something  above you, use a sturdy step stool that has a grab bar.  Keep electrical cables out of the way.  Do not use floor polish or wax that makes floors slippery. If you have to use wax, make sure that it is non-skid floor wax.  Remove throw rugs and other tripping hazards from the floor. WHAT CAN I DO IN THE STAIRWAYS?  Do not leave any items on the stairs.  Make sure that there are handrails on both sides of the stairs. Fix handrails that are broken or loose. Make sure that handrails are as long as the stairways.  Check any carpeting to make sure that it is firmly attached to the stairs. Fix any carpet that is loose or worn.  Avoid having throw rugs at the top or bottom of stairways, or secure the rugs with carpet tape to prevent them from moving.  Make sure that you have a light switch at the top of the stairs and the bottom of the stairs. If you do not have them, have them installed. WHAT ARE SOME OTHER FALL PREVENTION TIPS?  Wear closed-toe shoes that fit well and support your feet. Wear shoes that have rubber soles or low heels.  When you use a stepladder, make sure that it is completely opened and that the sides are firmly locked. Have someone hold the ladder while you   are using it. Do not climb a closed stepladder.  Add color or contrast paint or tape to grab bars and handrails in your home. Place contrasting color strips on the first and last steps.  Use mobility aids as needed, such as canes, walkers, scooters, and crutches.  Turn on lights if it is dark. Replace any light bulbs that burn out.  Set up furniture so that there are clear paths. Keep the furniture in the same spot.  Fix any uneven floor surfaces.  Choose a carpet design that does not hide the edge of steps of a stairway.  Be aware of any and all pets.  Review your medicines with your healthcare provider. Some medicines can cause dizziness or changes in blood pressure, which increase your risk of falling. Talk  with your health care provider about other ways that you can decrease your risk of falls. This may include working with a physical therapist or trainer to improve your strength, balance, and endurance.   This information is not intended to replace advice given to you by your health care provider. Make sure you discuss any questions you have with your health care provider.   Document Released: 07/24/2002 Document Revised: 12/18/2014 Document Reviewed: 09/07/2014 Elsevier Interactive Patient Education 2016 [employer]  

## 2016-03-19 NOTE — Progress Notes (Signed)
Subjective:    Patient ID: Sergio Baker, male    DOB: 11-30-1934, 81 y.o.   MRN: [id number]   Patient here today for follow up of chronic medical problems. He says that he is doing well without complaints today.  Outpatient Encounter Prescriptions as of 03/19/2016  Medication Sig  . aspirin EC 81 MG tablet Take 81 mg by mouth daily.  Raelyn Ensign Pollen 550 MG CAPS Take 1 tablet by mouth.  . Calcium Carbonate-Vitamin D (CALCIUM 600/VITAMIN D PO) Take 1 tablet by mouth daily.  . Cholecalciferol (VITAMIN D3) 2000 UNITS capsule Take 2,000 Units by mouth daily.  . citalopram (CELEXA) 20 MG tablet TAKE ONE (1) TABLET EACH DAY  . glucosamine-chondroitin 500-400 MG tablet Take 1 tablet by mouth daily.  . haloperidol (HALDOL) 1 MG tablet Take 1.5 tablets (1.5 mg total) by mouth 2 (two) times daily.  Marland Kitchen levothyroxine (SYNTHROID, LEVOTHROID) 50 MCG tablet TAKE ONE (1) TABLET EACH DAY  . Multiple Vitamin (MULTIVITAMIN WITH MINERALS) TABS Take 1 tablet by mouth daily.  . Omega-3 Fatty Acids 1200 MG CAPS Take 1,200 capsules by mouth daily.  Marland Kitchen omeprazole (PRILOSEC) 20 MG capsule TAKE ONE (1) CAPSULE EACH DAY   No facility-administered encounter medications on file as of 03/19/2016.     Atrial Fibrillation  Presents for follow-up visit. Symptoms are negative for chest pain, shortness of breath and weakness. Past medical history includes atrial fibrillation and hyperlipidemia.  Hyperlipidemia  This is a chronic problem. The current episode started more than 1 year ago. Recent lipid tests were reviewed and are variable. Pertinent negatives include no chest pain or shortness of breath. Current antihyperlipidemic treatment includes statins. The current treatment provides moderate improvement of lipids. Compliance problems include adherence to diet and adherence to exercise.  Risk factors for coronary artery disease include dyslipidemia and male sex.   Huntington's/ gait abnormality No much change since last  visit-Was encouraged to use a cane as gait has been more unsteady but has not felt the need to use it as he has not fallen. He sees neurologist every 3 months. Hypothyroidism  Currnetly on levothyroxin 50 mcg- doing well- no c/o fatigue Depression Currently on celexa which he says is working well without side effects. Hyperlipidemia  Patient has had for >10 years. He is currently taking crestor without any problems. He is trying to watch diet. GERD Omeprazole daily keeps symptoms under control Ulcerative colitis Has had recent flare up and was put on lialda which is helping Atrial fib currently on no meds BPH Psa has been elevated and he is seeing urologist for follow up. Has some urinary hesistancy daily. HAs follow up in October.    Review of Systems  Constitutional: Negative.   HENT: Negative.   Respiratory: Negative.  Negative for chest tightness and shortness of breath.   Cardiovascular: Negative.  Negative for chest pain.  Gastrointestinal: Negative for abdominal pain and nausea.  Genitourinary: Negative.   Musculoskeletal: Positive for arthralgias and gait problem (Some unsteadiness, denies falls. ). Negative for joint swelling and neck stiffness.  Neurological: Negative for tremors, syncope and weakness.  Psychiatric/Behavioral: Negative.   All other systems reviewed and are negative.      Objective:   Physical Exam  Constitutional: He is oriented to person, place, and time. He appears well-developed and well-nourished.  HENT:  Head: Normocephalic.  Right Ear: External ear normal.  Left Ear: External ear normal.  Nose: Nose normal.  Mouth/Throat: Oropharynx is clear and moist.  Eyes: EOM are normal. Pupils are equal, round, and reactive to light.  Neck: Normal range of motion. Neck supple. No JVD present. No thyromegaly present.  Cardiovascular: Normal rate, normal heart sounds and intact distal pulses.  Exam reveals no gallop and no friction rub.   No murmur  heard. Irregular heart rate   Pulmonary/Chest: Effort normal and breath sounds normal. No respiratory distress. He has no wheezes. He has no rales. He exhibits no tenderness.  Abdominal: Soft. Bowel sounds are normal. He exhibits no mass. There is no tenderness.  Genitourinary: Prostate normal and penis normal.  Musculoskeletal: Normal range of motion. He exhibits no edema.  Constant jerking movements. Gait slightly unsteady.  Lymphadenopathy:    He has no cervical adenopathy.  Neurological: He is alert and oriented to person, place, and time. No cranial nerve deficit. Abnormal gait: Unsteady gait.   Skin: Skin is warm and dry.  Psychiatric: He has a normal mood and affect. His speech is normal and behavior is normal. Judgment and thought content normal.  Vitals reviewed.   BP 136/74   Pulse 65   Temp (!) 96.8 F (36 C) (Oral)   Ht 5' 5"  (1.651 m)   Wt 165 lb (74.8 kg)   BMI 27.46 kg/m       Assessment & Plan:  1. Chronic atrial fibrillation (HCC) Continue daily baby asiprin  2. Diverticulosis of large intestine without hemorrhage Watch diet  3. 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 Gastroesophageal reflux disease, esophagitis presence not specified Avoid spicy foods Do not eat 2 hours prior to bedtime  5. Hypothyroidism due to acquired atrophy of thyroid - Thyroid Panel With TSH  6. Huntington's chorea ([place]) Keep follow up with neurologist  7. BPH (benign prostatic hyperplasia)  8. Hyperlipidemia with target LDL less than 100 Low fat diet - CMP14+EGFR - Lipid panel  9. Depression Stress management  10. BMI 27.0-27.9,adult Discussed diet and exercise for person with BMI >25 Will recheck weight in 3-6 months  11. Abnormality of gait Fall precautions    Labs pending Health maintenance reviewed Diet and exercise encouraged Continue all meds Follow up  In 3 months   [place], FNP

## 2016-03-20 ENCOUNTER — Encounter: Payer: Self-pay | Admitting: *Deleted

## 2016-03-20 LAB — LIPID PANEL
Chol/HDL Ratio: 5.7 ratio units — ABNORMAL HIGH (ref 0.0–5.0)
Cholesterol, Total: 187 mg/dL (ref 100–199)
HDL: 33 mg/dL — AB (ref >39)
LDL Calculated: 125 mg/dL — ABNORMAL HIGH (ref 0–99)
TRIGLYCERIDES: 146 mg/dL (ref 0–149)
VLDL CHOLESTEROL CAL: 29 mg/dL (ref 5–40)

## 2016-03-20 LAB — CMP14+EGFR
ALT: 12 IU/L (ref 0–44)
AST: 22 IU/L (ref 0–40)
Albumin/Globulin Ratio: 1.8 (ref 1.2–2.2)
Albumin: 4.2 g/dL (ref 3.5–4.8)
Alkaline Phosphatase: 58 IU/L (ref 39–117)
BILIRUBIN TOTAL: 0.5 mg/dL (ref 0.0–1.2)
BUN/Creatinine Ratio: 17 (ref 10–24)
BUN: 15 mg/dL (ref 8–27)
CALCIUM: 9.4 mg/dL (ref 8.6–10.2)
CHLORIDE: 103 mmol/L (ref 96–106)
CO2: 26 mmol/L (ref 18–29)
Creatinine, Ser: 0.88 mg/dL (ref 0.76–1.27)
GFR calc non Af Amer: 82 mL/min/{1.73_m2} (ref >59)
GFR, EST AFRICAN AMERICAN: 94 mL/min/{1.73_m2} (ref >59)
GLUCOSE: 105 mg/dL — AB (ref 65–99)
Globulin, Total: 2.4 g/dL (ref 1.5–4.5)
Potassium: 4.7 mmol/L (ref 3.5–5.2)
Sodium: 147 mmol/L — ABNORMAL HIGH (ref 134–144)
TOTAL PROTEIN: 6.6 g/dL (ref 6.0–8.5)

## 2016-03-20 LAB — THYROID PANEL WITH TSH
Free Thyroxine Index: 1.7 (ref 1.2–4.9)
T3 Uptake Ratio: 29 % (ref 24–39)
T4, Total: 6 ug/dL (ref 4.5–12.0)
TSH: 3.31 u[IU]/mL (ref 0.450–4.500)

## 2016-05-07 ENCOUNTER — Ambulatory Visit (INDEPENDENT_AMBULATORY_CARE_PROVIDER_SITE_OTHER): Payer: Medicare Other

## 2016-05-07 DIAGNOSIS — Z23 Encounter for immunization: Secondary | ICD-10-CM

## 2016-05-18 ENCOUNTER — Other Ambulatory Visit (INDEPENDENT_AMBULATORY_CARE_PROVIDER_SITE_OTHER): Payer: Medicare Other

## 2016-05-18 DIAGNOSIS — R972 Elevated prostate specific antigen [PSA]: Secondary | ICD-10-CM

## 2016-05-19 ENCOUNTER — Encounter: Payer: Self-pay | Admitting: Family Medicine

## 2016-05-19 ENCOUNTER — Ambulatory Visit (INDEPENDENT_AMBULATORY_CARE_PROVIDER_SITE_OTHER): Payer: Medicare Other | Admitting: Family Medicine

## 2016-05-19 VITALS — BP 138/84 | HR 82 | Temp 97.1°F | Ht 65.0 in | Wt 162.4 lb

## 2016-05-19 DIAGNOSIS — R399 Unspecified symptoms and signs involving the genitourinary system: Secondary | ICD-10-CM

## 2016-05-19 LAB — URINALYSIS, COMPLETE
Bilirubin, UA: NEGATIVE
Glucose, UA: NEGATIVE
Ketones, UA: NEGATIVE
LEUKOCYTES UA: NEGATIVE
Nitrite, UA: NEGATIVE
PH UA: 7 (ref 5.0–7.5)
PROTEIN UA: NEGATIVE
RBC, UA: NEGATIVE
Specific Gravity, UA: 1.01 (ref 1.005–1.030)
UUROB: 0.2 mg/dL (ref 0.2–1.0)

## 2016-05-19 LAB — MICROSCOPIC EXAMINATION
BACTERIA UA: NONE SEEN
Epithelial Cells (non renal): NONE SEEN /hpf (ref 0–10)
RBC, UA: NONE SEEN /hpf (ref 0–?)
WBC UA: NONE SEEN /HPF (ref 0–?)

## 2016-05-19 LAB — PSA, TOTAL AND FREE
PROSTATE SPECIFIC AG, SERUM: 5.1 ng/mL — AB (ref 0.0–4.0)
PSA FREE PCT: 21.6 %
PSA FREE: 1.1 ng/mL

## 2016-05-19 MED ORDER — DOXYCYCLINE HYCLATE 100 MG PO CAPS: 100.0000 mg | ORAL_CAPSULE | Freq: Two times a day (BID) | ORAL | 0 refills | Status: DC

## 2016-05-19 NOTE — Addendum Note (Signed)
Addended by: Marin Olp on: 05/19/2016 05:05 PM   Modules accepted: Orders

## 2016-05-19 NOTE — Progress Notes (Signed)
Subjective:  Patient ID: Sergio Baker, male    DOB: 08/18/1934  Age: 81 y.o. MRN: [id number]  CC: Flank Pain (pt here today c/o flank pain and urinary frequency since last night and he states he gets frequent UTI)   HPI Sergio Baker presents for Recurrent symptoms of infection of the urinary tract. He reports that since last night he's been having frequency and burning with urination. He is having right flank pain but it does radiate across the upper lumbar region to the left flank somewhat as well. Pain is moderately intense. It is a dull ache and fairly constant since last night. He denies any nausea or fever. 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   He has a past surgical history that includes Appendectomy; Tonsillectomy; cyst removed from urinary bladder; Colonoscopy (02/2010); Esophagogastroduodenoscopy (04/2008); Colonoscopy (03/24/2012); Shoulder arthroscopy w/ rotator cuff repair (Bilateral); Lumbar fusion; and Colonoscopy (N/A, 10/05/2014).   His family history includes Arthritis in his sister; Cancer in his brother and sister; Huntington's disease in his daughter; Lung cancer (age of onset: 53) in his mother; Other in his brother; SIDS in his brother.He reports that he has quit smoking. His smoking use included Cigarettes. He smoked 0.25 packs per day. He has never used smokeless tobacco. He reports that he does not drink alcohol or use drugs.  Current Outpatient Prescriptions on File Prior to Visit  Medication Sig Dispense Refill  . aspirin EC 81 MG tablet Take 81 mg by mouth daily.    Raelyn Ensign Pollen 550 MG CAPS Take 1 tablet by mouth.    . Calcium Carbonate-Vitamin D (CALCIUM 600/VITAMIN D PO) Take 1 tablet by mouth daily.    . Cholecalciferol (VITAMIN D3) 2000 UNITS capsule Take 2,000 Units by mouth daily.    . citalopram (CELEXA) 20 MG tablet TAKE ONE (1) TABLET EACH DAY 30 tablet 5  . glucosamine-chondroitin 500-400 MG tablet Take 1 tablet by mouth daily.    . haloperidol (HALDOL) 1 MG tablet Take 1.5 tablets (1.5 mg total) by mouth 2 (two) times daily. 270 tablet 1  . levothyroxine (SYNTHROID, LEVOTHROID) 50 MCG tablet TAKE ONE (1) TABLET EACH DAY 90 tablet 3  . Multiple Vitamin (MULTIVITAMIN WITH MINERALS) TABS Take 1 tablet by mouth daily.    . Omega-3 Fatty Acids 1200 MG CAPS Take 1,200 capsules by mouth daily.    Marland Kitchen omeprazole (PRILOSEC) 20 MG capsule TAKE ONE (1) CAPSULE EACH DAY 90 capsule 1   No current facility-administered medications on file prior to visit.     ROS Review of Systems  Constitutional: Negative for chills, fatigue and fever.  Respiratory: Negative for cough and shortness of breath.   Cardiovascular: Negative for chest pain.  Gastrointestinal: Negative for abdominal pain.  Genitourinary: Positive for flank pain and frequency. Negative for hematuria.  Musculoskeletal: Positive for back pain. Negative for myalgias.  Skin: Negative for color change and rash.  Neurological: Negative for dizziness and syncope.    Objective:  BP 138/84   Pulse 82   Temp 97.1 F (36.2 C) (Oral)   Ht 5' 5"  (1.651 m)   Wt 162 lb 6 oz (73.7 kg)   BMI 27.02 kg/m  Physical Exam  Constitutional: He is oriented to person, place, and time. He appears well-developed and well-nourished.  HENT:  Head: Normocephalic and atraumatic.  Eyes: EOM are normal.  Neck: Normal range of motion. Neck supple.  Cardiovascular: Normal rate and regular rhythm.   No murmur heard. Pulmonary/Chest: Breath sounds normal. No respiratory distress.  Abdominal: He exhibits no mass. There  is no tenderness. There is no guarding.  Musculoskeletal: He exhibits no tenderness.  Neurological: He is alert and oriented to person, place, and time.  Vitals reviewed.   Assessment & Plan:   Sergio Baker was seen today for flank pain.  Diagnoses and all orders for this visit:  UTI symptoms -     Urinalysis, Complete  Other orders -     doxycycline (VIBRAMYCIN) 100 MG capsule; Take 1 capsule (100 mg total) by mouth 2 (two) times daily.   I am having [name] start on doxycycline. I am also having him maintain his multivitamin with minerals, Omega-3 Fatty Acids, glucosamine-chondroitin, Calcium Carbonate-Vitamin D (CALCIUM 600/VITAMIN D PO), Bee Pollen, Vitamin D3, aspirin EC, citalopram, omeprazole, levothyroxine, and haloperidol.  Meds ordered this encounter  Medications  . doxycycline (VIBRAMYCIN) 100 MG capsule    Sig: Take 1 capsule (100 mg total) by mouth 2 (two) times daily.    Dispense:  28 capsule    Refill:  0     Follow-up: Return if symptoms worsen or fail to improve and with urology next week as scheduled.  Claretta Fraise, M.D.

## 2016-05-20 LAB — URINE CULTURE: Organism ID, Bacteria: NO GROWTH

## 2016-05-28 DIAGNOSIS — N3090 Cystitis, unspecified without hematuria: Secondary | ICD-10-CM | POA: Diagnosis not present

## 2016-05-28 DIAGNOSIS — R972 Elevated prostate specific antigen [PSA]: Secondary | ICD-10-CM | POA: Diagnosis not present

## 2016-07-03 ENCOUNTER — Encounter: Payer: Self-pay | Admitting: Nurse Practitioner

## 2016-07-03 ENCOUNTER — Ambulatory Visit (INDEPENDENT_AMBULATORY_CARE_PROVIDER_SITE_OTHER): Payer: Medicare Other | Admitting: Nurse Practitioner

## 2016-07-03 VITALS — BP 144/82 | HR 75 | Temp 96.8°F | Ht 65.0 in | Wt 161.0 lb

## 2016-07-03 DIAGNOSIS — N400 Enlarged prostate without lower urinary tract symptoms: Secondary | ICD-10-CM | POA: Diagnosis not present

## 2016-07-03 DIAGNOSIS — R413 Other amnesia: Secondary | ICD-10-CM

## 2016-07-03 DIAGNOSIS — G10 Huntington's disease: Secondary | ICD-10-CM

## 2016-07-03 DIAGNOSIS — I482 Chronic atrial fibrillation, unspecified: Secondary | ICD-10-CM

## 2016-07-03 DIAGNOSIS — E785 Hyperlipidemia, unspecified: Secondary | ICD-10-CM | POA: Diagnosis not present

## 2016-07-03 DIAGNOSIS — E034 Atrophy of thyroid (acquired): Secondary | ICD-10-CM

## 2016-07-03 DIAGNOSIS — K219 Gastro-esophageal reflux disease without esophagitis: Secondary | ICD-10-CM

## 2016-07-03 DIAGNOSIS — Z6827 Body mass index (BMI) 27.0-27.9, adult: Secondary | ICD-10-CM

## 2016-07-03 DIAGNOSIS — F3342 Major depressive disorder, recurrent, in full remission: Secondary | ICD-10-CM

## 2016-07-03 DIAGNOSIS — K5120 Ulcerative (chronic) proctitis without complications: Secondary | ICD-10-CM | POA: Diagnosis not present

## 2016-07-03 DIAGNOSIS — R269 Unspecified abnormalities of gait and mobility: Secondary | ICD-10-CM | POA: Diagnosis not present

## 2016-07-03 MED ORDER — HALOPERIDOL 1 MG PO TABS: 1.5000 mg | ORAL_TABLET | Freq: Two times a day (BID) | ORAL | 1 refills | Status: DC

## 2016-07-03 MED ORDER — CITALOPRAM HYDROBROMIDE 20 MG PO TABS: ORAL_TABLET | ORAL | 5 refills | Status: DC

## 2016-07-03 MED ORDER — OMEPRAZOLE 20 MG PO CPDR: DELAYED_RELEASE_CAPSULE | ORAL | 1 refills | Status: DC

## 2016-07-03 NOTE — Patient Instructions (Signed)
Fall Prevention in Hospitals, Adult WHAT ARE SOME SAFETY TIPS FOR PREVENTING FALLS? If you or a loved one has to stay in the hospital, talk with the health care providers about the risk of falling. Find out which medicines or treatments can cause dizziness or affect balance. Make a plan with the health care providers to prevent falls. The plan may include these points:  Ask for help moving around, especially after surgery or when feeling unwell.  Have support available when getting up and moving around.  Wear nonskid footwear.  Use the safety straps on wheelchairs.  Ask for help to get objects that are out of reach.  Wear eyeglasses.  Remove all clutter from the floor and the sides of the bed.  Keep equipment and wires securely out of the way.  Keep the bed locked in the low position.  Keep the side rails up on the bed.  Keep the nurse call button within reach.  Keep the door open when no one else is in the room.  Have someone stay in the hospital with you or your loved one.  Ask for a bed alarm if you are not able to stay with your loved one who is at risk for getting up without help.  Ask if sleeping pills or other medicines that alter mental status are necessary. WHAT INCREASES THE RISK FOR FALLS? Certain conditions and treatments may increase a patient's risk for falls in a hospital. These include:  Being in an unfamiliar environment.  Being on bed rest.  Having surgery.  Taking certain medicines, such as sleeping pills.  Having tubes in place, such as IV lines or catheters. Additional risk factors for falls in a hospital include:  Having vision problems.  Having a change in thinking, feeling, or behavior (altered mental status).  Having trouble with balance.  Needing to use the toilet frequently.  Having fallen in the past three months.  Having low blood pressure when standing up quickly (orthostatic hypotension). WHAT DOES THE HOSPITAL STAFF DO TO HELP  PREVENT ME OR MY LOVED ONE FROM FALLING? Hospitals have systems in place to prevent falls and accidents. Talk with the hospital staff about:  Doing an assessment to discuss fall risks and create a personalized plan to prevent falls.  Checking in regularly to see if help is needed for moving around and to assess any changes in fall risk.  Knowing where the nurse call button is and how to use it. Use this to call a nursing care provider any time.  Keeping personal items within reach. This includes eyeglasses, phones, and other electronic devices.  Following general safety guidelines when moving around.  Keeping the area around the bed free from clutter.  Removing unnecessary equipment or tubes to reduce the risk of tripping.  Using safety equipment, such as:  Walkers, crutches, and other walking devices for support.  Safety rails on the bed.  Safety straps in the bed.  A bed that can be lowered and locked to prevent movement.  Handrails in the bathroom.  Nonskid socks and shoes.  Locking mechanisms to secure equipment in place.  Lifting and transfer equipment. WHAT CAN I DO TO HELP PREVENT A FALL?  Talk with health care providers about fall prevention.  Have a personalized fall prevention plan in place.  Do not try to move around if you feel off balance or ill.  Change position slowly.  Sit on the side of the bed before standing up.  Sit down and call  for help if you feel dizzy or unsteady when standing.  Keep the hospital room clear of clutter. WHEN SHOULD I ASK FOR HELP? Ask for help whenever you:  Are not sure if you are able to move around safely.  Feel dizzy or unsteady.  Are not comfortable helping your loved one move around or use the bathroom. If you or a loved one falls, tell the hospital staff. This is important. This information is not intended to replace advice given to you by your health care provider. Make sure you discuss any questions you have  with your health care provider. Document Released: 07/31/2000 Document Revised: 12/31/2015 Document Reviewed: 05/16/2015 Elsevier Interactive Patient Education  2017 [employer].

## 2016-07-03 NOTE — Progress Notes (Signed)
Subjective:    Patient ID: Sergio Baker, male    DOB: 1935-05-01, 81 y.o.   MRN: [id number]   Patient here today for follow up of chronic medical problems. He says that he is doing well without complaints today.  Outpatient Encounter Prescriptions as of 07/03/2016  Medication Sig  . aspirin EC 81 MG tablet Take 81 mg by mouth daily.  Raelyn Ensign Pollen 550 MG CAPS Take 1 tablet by mouth.  . Calcium Carbonate-Vitamin D (CALCIUM 600/VITAMIN D PO) Take 1 tablet by mouth daily.  . Cholecalciferol (VITAMIN D3) 2000 UNITS capsule Take 2,000 Units by mouth daily.  . citalopram (CELEXA) 20 MG tablet TAKE ONE (1) TABLET EACH DAY  . doxycycline (VIBRAMYCIN) 100 MG capsule Take 1 capsule (100 mg total) by mouth 2 (two) times daily.  Marland Kitchen glucosamine-chondroitin 500-400 MG tablet Take 1 tablet by mouth daily.  . haloperidol (HALDOL) 1 MG tablet Take 1.5 tablets (1.5 mg total) by mouth 2 (two) times daily.  Marland Kitchen levothyroxine (SYNTHROID, LEVOTHROID) 50 MCG tablet TAKE ONE (1) TABLET EACH DAY  . Multiple Vitamin (MULTIVITAMIN WITH MINERALS) TABS Take 1 tablet by mouth daily.  . Omega-3 Fatty Acids 1200 MG CAPS Take 1,200 capsules by mouth daily.  Marland Kitchen omeprazole (PRILOSEC) 20 MG capsule TAKE ONE (1) CAPSULE EACH DAY   No facility-administered encounter medications on file as of 07/03/2016.     Atrial Fibrillation  Presents for follow-up visit. Symptoms are negative for chest pain, shortness of breath and weakness. Past medical history includes atrial fibrillation and hyperlipidemia.  Hyperlipidemia  This is a chronic problem. The current episode started more than 1 year ago. Recent lipid tests were reviewed and are variable. Pertinent negatives include no chest pain or shortness of breath. Current antihyperlipidemic treatment includes statins. The current treatment provides moderate improvement of lipids. Compliance problems include adherence to diet and adherence to exercise.  Risk factors for coronary artery  disease include dyslipidemia and male sex.   Huntington's/ gait abnormality No much change since last visit-Was encouraged to use a cane as gait has been more unsteady but has not felt the need to use it as he has not fallen. His wife how ever thinks he is getting some worse He sees neurologist every 3 months. Hypothyroidism  Currnetly on levothyroxin 50 mcg- doing well- no c/o fatigue Depression Currently on celexa which he says is working well without side effects. Hyperlipidemia  Patient has had for >10 years. He is currently taking crestor without any problems. He is trying to watch diet. GERD Omeprazole daily keeps symptoms under control Ulcerative colitis Has had recent flare up and was put on lialda which is helping Atrial fib currently on no meds BPH Psa has been elevated and he is seeing urologist for follow up. Has some urinary hesistancy daily. HAs follow up in October. Memory deficit Wife sees a slight decline in his memory and she just wants  [place] to be aware of it. Not at point where he needs medications she does not think.  Review of Systems  Constitutional: Negative.   HENT: Negative.   Respiratory: Negative.  Negative for chest tightness and shortness of breath.   Cardiovascular: Negative.  Negative for chest pain.  Gastrointestinal: Negative for abdominal pain and nausea.  Genitourinary: Negative.   Musculoskeletal: Positive for arthralgias and gait problem (Some unsteadiness, denies falls. ). Negative for joint swelling and neck stiffness.  Neurological: Negative for tremors, syncope and weakness.  Psychiatric/Behavioral: Negative.   All other  systems reviewed and are negative.      Objective:   Physical Exam  Constitutional: He is oriented to person, place, and time. He appears well-developed and well-nourished.  HENT:  Head: Normocephalic.  Right Ear: External ear normal.  Left Ear: External ear normal.  Nose: Nose normal.  Mouth/Throat: Oropharynx is  clear and moist.  Eyes: EOM are normal. Pupils are equal, round, and reactive to light.  Neck: Normal range of motion. Neck supple. No JVD present. No thyromegaly present.  Cardiovascular: Normal rate, normal heart sounds and intact distal pulses.  Exam reveals no gallop and no friction rub.   No murmur heard. Irregular heart rate   Pulmonary/Chest: Effort normal and breath sounds normal. No respiratory distress. He has no wheezes. He has no rales. He exhibits no tenderness.  Abdominal: Soft. Bowel sounds are normal. He exhibits no mass. There is no tenderness.  Genitourinary: Prostate normal and penis normal.  Musculoskeletal: Normal range of motion. He exhibits no edema.  Constant jerking movements. Gait slightly unsteady.  Lymphadenopathy:    He has no cervical adenopathy.  Neurological: He is alert and oriented to person, place, and time. No cranial nerve deficit. Abnormal gait: Unsteady gait.   Skin: Skin is warm and dry.  Psychiatric: He has a normal mood and affect. His speech is normal and behavior is normal. Judgment and thought content normal.  Vitals reviewed.  BP (!) 144/82 (BP Location: Right Arm, Cuff Size: Normal)   Pulse 75   Temp (!) 96.8 F (36 C) (Oral)   Ht _0  (1.651 m)   Wt 161 lb (73 kg)   BMI 26.79 kg/m      Assessment & Plan:  1. Chronic atrial fibrillation (HCC) Avoid caffeine  2. Gastroesophageal reflux disease, esophagitis presence not specified Avoid spicy foods Do not eat 2 hours prior to bedtime - omeprazole (PRILOSEC) 20 MG capsule; TAKE ONE (1) CAPSULE EACH DAY  Dispense: 90 capsule; Refill: 1  3. 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 Hypothyroidism due to acquired atrophy of thyroid - Thyroid Panel With TSH  5. Huntington's chorea ([place]) Fall precautions - haloperidol (HALDOL) 1 MG tablet; Take 1.5 tablets (1.5 mg total) by mouth 2 (two) times daily.  Dispense: 270 tablet; Refill: 1  6. Benign prostatic hyperplasia  without lower urinary tract symptoms Keep follow up with urologist  7. Abnormality of gait Fall precautions  8. BMI 27.0-27.9,adult Discussed diet and exercise for person with BMI >25 Will recheck weight in 3-6 months  9. Recurrent major depressive disorder, in full remission ([place]) Stress management - citalopram (CELEXA) 20 MG tablet; TAKE ONE (1) TABLET EACH DAY  Dispense: 30 tablet; Refill: 5 - CMP14+EGFR  10. Hyperlipidemia with target LDL less than 100 Low fat diet - Lipid panel  11. Memory deficits    Labs pending Health maintenance reviewed Diet and exercise encouraged Continue all meds Follow up  In 3 months   [place], FNP

## 2016-07-04 LAB — CMP14+EGFR
A/G RATIO: 2 (ref 1.2–2.2)
ALBUMIN: 4.7 g/dL (ref 3.5–4.8)
ALK PHOS: 66 IU/L (ref 39–117)
ALT: 14 IU/L (ref 0–44)
AST: 19 IU/L (ref 0–40)
BILIRUBIN TOTAL: 0.4 mg/dL (ref 0.0–1.2)
BUN / CREAT RATIO: 19 (ref 10–24)
BUN: 17 mg/dL (ref 8–27)
CO2: 26 mmol/L (ref 18–29)
CREATININE: 0.89 mg/dL (ref 0.76–1.27)
Calcium: 9.4 mg/dL (ref 8.6–10.2)
Chloride: 99 mmol/L (ref 96–106)
GFR calc Af Amer: 94 mL/min/{1.73_m2} (ref >59)
GFR calc non Af Amer: 81 mL/min/{1.73_m2} (ref >59)
GLOBULIN, TOTAL: 2.3 g/dL (ref 1.5–4.5)
Glucose: 104 mg/dL — ABNORMAL HIGH (ref 65–99)
Potassium: 3.9 mmol/L (ref 3.5–5.2)
SODIUM: 142 mmol/L (ref 134–144)
Total Protein: 7 g/dL (ref 6.0–8.5)

## 2016-07-04 LAB — LIPID PANEL
CHOLESTEROL TOTAL: 195 mg/dL (ref 100–199)
Chol/HDL Ratio: 5.6 ratio units — ABNORMAL HIGH (ref 0.0–5.0)
HDL: 35 mg/dL — ABNORMAL LOW (ref >39)
LDL CALC: 131 mg/dL — AB (ref 0–99)
Triglycerides: 145 mg/dL (ref 0–149)
VLDL Cholesterol Cal: 29 mg/dL (ref 5–40)

## 2016-07-04 LAB — THYROID PANEL WITH TSH
Free Thyroxine Index: 1.5 (ref 1.2–4.9)
T3 UPTAKE RATIO: 29 % (ref 24–39)
T4 TOTAL: 5.3 ug/dL (ref 4.5–12.0)
TSH: 3.41 u[IU]/mL (ref 0.450–4.500)

## 2016-07-27 ENCOUNTER — Encounter: Payer: Self-pay | Admitting: Nurse Practitioner

## 2016-07-27 ENCOUNTER — Ambulatory Visit (INDEPENDENT_AMBULATORY_CARE_PROVIDER_SITE_OTHER): Payer: Medicare Other | Admitting: Nurse Practitioner

## 2016-07-27 ENCOUNTER — Telehealth: Payer: Self-pay | Admitting: Nurse Practitioner

## 2016-07-27 ENCOUNTER — Ambulatory Visit (INDEPENDENT_AMBULATORY_CARE_PROVIDER_SITE_OTHER): Payer: Medicare Other

## 2016-07-27 VITALS — BP 145/96 | HR 69 | Temp 97.4°F | Ht 65.0 in | Wt 161.0 lb

## 2016-07-27 DIAGNOSIS — M25572 Pain in left ankle and joints of left foot: Secondary | ICD-10-CM | POA: Diagnosis not present

## 2016-07-27 MED ORDER — MELOXICAM 15 MG PO TABS: 15.0000 mg | ORAL_TABLET | Freq: Every day | ORAL | 1 refills | Status: DC

## 2016-07-27 MED ORDER — HYDROCODONE-ACETAMINOPHEN 5-325 MG PO TABS: 1.0000 | ORAL_TABLET | Freq: Four times a day (QID) | ORAL | 0 refills | Status: DC | PRN

## 2016-07-27 NOTE — Addendum Note (Signed)
Addended by: Chevis Pretty on: 07/27/2016 01:46 PM   Modules accepted: Orders

## 2016-07-27 NOTE — Progress Notes (Addendum)
   Subjective:    Patient ID: Sergio Baker, male    DOB: 12/14/1934, 81 y.o.   MRN: [id number]  HPI Patient is brought in by his wife C/O left ankle swelling and pain- said he was just walking across the floor and all the sudden his ankle started hurting. Denies any injury. Now it is swollen.    Review of Systems  Constitutional: Negative.   HENT: Negative.   Respiratory: Negative.   Cardiovascular: Negative.   Genitourinary: Negative.   Neurological: Negative.   Psychiatric/Behavioral: Negative.   All other systems reviewed and are negative.      Objective:   Physical Exam  Constitutional: He appears well-developed and well-nourished. No distress.  Cardiovascular: Normal rate, regular rhythm and normal heart sounds.   Pulmonary/Chest: Effort normal and breath sounds normal.  Musculoskeletal:  Left ankle swelling, more lateral then medial.with pain on flexion and extension.  Neurological: He is alert.  Skin: Skin is warm.  Psychiatric: He has a normal mood and affect. His behavior is normal. Judgment and thought content normal.   BP (!) 145/96   Pulse 69   Temp 97.4 F (36.3 C) (Oral)   Ht 5' 5"  (1.651 m)   Wt 161 lb (73 kg)   BMI 26.79 kg/m    Left ankle xray- old abnormality on left medial ankle- no fracture visible.     Assessment & Plan:   1. Pain of joint of left ankle and foot    Ice BID Elevate when sitting RTO prn  Meds ordered this encounter  Medications  . meloxicam (MOBIC) 15 MG tablet    Sig: Take 1 tablet (15 mg total) by mouth daily.    Dispense:  30 tablet    Refill:  1    Order Specific Question:   Supervising Provider    Answer:   VINCENT, CAROL L [[id number]]  . HYDROcodone-acetaminophen (LORTAB) 5-325 MG tablet    Sig: Take 1 tablet by mouth every 6 (six) hours as needed for moderate pain.    Dispense:  30 tablet    Refill:  0    Order Specific Question:   Supervising Provider    Answer:   Eustaquio Maize [[id number]]    Mary-Margaret  Hassell Done, FNP

## 2016-07-27 NOTE — Telephone Encounter (Signed)
Pt called in with ankle pain appt scheduled

## 2016-07-27 NOTE — Patient Instructions (Signed)
Ankle Pain Many things can cause ankle pain, including an injury to the area and overuse of the ankle.The ankle joint holds your body weight and allows you to move around. Ankle pain can occur on either side or the back of one ankle or both ankles. Ankle pain may be sharp and burning or dull and aching. There may be tenderness, stiffness, redness, or warmth around the ankle. Follow these instructions at home: Activity  Rest your ankle as told by your health care provider. Avoid any activities that cause ankle pain.  Do exercises as told by your health care provider.  Ask your health care provider if you can drive. Using a brace, a bandage, or crutches  If you were given a brace: ? Wear it as told by your health care provider. ? Remove it when you take a bath or a shower. ? Try not to move your ankle very much, but wiggle your toes from time to time. This helps to prevent swelling.  If you were given an elastic bandage: ? Remove it when you take a bath or a shower. ? Try not to move your ankle very much, but wiggle your toes from time to time. This helps to prevent swelling. ? Adjust the bandage to make it more comfortable if it feels too tight. ? Loosen the bandage if you have numbness or tingling in your foot or if your foot turns cold and blue.  If you have crutches, use them as told by your health care provider. Continue to use them until you can walk without feeling pain in your ankle. Managing pain, stiffness, and swelling  Raise (elevate) your ankle above the level of your heart while you are sitting or lying down.  If directed, apply ice to the area: ? Put ice in a plastic bag. ? Place a towel between your skin and the bag. ? Leave the ice on for 20 minutes, 2-3 times per day. General instructions  Keep all follow-up visits as told by your health care provider. This is important.  Record this information that may be helpful for you and your health care provider: ? How  often you have ankle pain. ? Where the pain is located. ? What the pain feels like.  Take over-the-counter and prescription medicines only as told by your health care provider. Contact a health care provider if:  Your pain gets worse.  Your pain is not relieved with medicines.  You have a fever or chills.  You are having more trouble with walking.  You have new symptoms. Get help right away if:  Your foot, leg, toes, or ankle tingles or becomes numb.  Your foot, leg, toes, or ankle becomes swollen.  Your foot, leg, toes, or ankle turns pale or blue. This information is not intended to replace advice given to you by your health care provider. Make sure you discuss any questions you have with your health care provider. Document Released: 01/21/2010 Document Revised: 04/03/2016 Document Reviewed: 03/05/2015 Elsevier Interactive Patient Education  2017 Elsevier Inc.  

## 2016-07-30 DIAGNOSIS — M79673 Pain in unspecified foot: Secondary | ICD-10-CM | POA: Diagnosis not present

## 2016-07-30 DIAGNOSIS — R601 Generalized edema: Secondary | ICD-10-CM | POA: Diagnosis not present

## 2016-07-30 DIAGNOSIS — S9032XA Contusion of left foot, initial encounter: Secondary | ICD-10-CM | POA: Diagnosis not present

## 2016-08-19 ENCOUNTER — Encounter: Payer: Self-pay | Admitting: Internal Medicine

## 2016-08-27 ENCOUNTER — Telehealth: Payer: Self-pay

## 2016-08-27 NOTE — Telephone Encounter (Signed)
Pt's wife called office. He received letter that it is time for a colonoscopy, but he would need an OV first. She said he is having no problems at all. No diarrhea, constipation or bleeding. Michela Pitcher he is almost 83 years old and she wants to know if it is necessary for him to have another colonoscopy. Advised pt I would send a message to Dr. Gala Romney.

## 2016-08-28 NOTE — Telephone Encounter (Signed)
He asks a very good question.  He may not need another TCS; he should have an OV scheduled sometime later this year - we can then discuss.

## 2016-08-31 NOTE — Telephone Encounter (Signed)
Called and informed pt's wife. She said she would make a note to call later to schedule OV, said maybe in June.

## 2016-09-07 ENCOUNTER — Ambulatory Visit: Payer: Medicare Other | Admitting: Adult Health

## 2016-09-15 DIAGNOSIS — H25813 Combined forms of age-related cataract, bilateral: Secondary | ICD-10-CM | POA: Diagnosis not present

## 2016-09-15 DIAGNOSIS — H524 Presbyopia: Secondary | ICD-10-CM | POA: Diagnosis not present

## 2016-09-15 DIAGNOSIS — H52223 Regular astigmatism, bilateral: Secondary | ICD-10-CM | POA: Diagnosis not present

## 2016-09-15 DIAGNOSIS — H5203 Hypermetropia, bilateral: Secondary | ICD-10-CM | POA: Diagnosis not present

## 2016-09-16 ENCOUNTER — Ambulatory Visit (INDEPENDENT_AMBULATORY_CARE_PROVIDER_SITE_OTHER): Payer: Medicare Other | Admitting: Adult Health

## 2016-09-16 ENCOUNTER — Encounter: Payer: Self-pay | Admitting: Adult Health

## 2016-09-16 VITALS — BP 146/87 | HR 90 | Ht 65.0 in | Wt 157.0 lb

## 2016-09-16 DIAGNOSIS — G10 Huntington's disease: Secondary | ICD-10-CM

## 2016-09-16 NOTE — Progress Notes (Signed)
I have read the note, and I agree with the clinical assessment and plan.  Billiejean Schimek KEITH   

## 2016-09-16 NOTE — Progress Notes (Signed)
PATIENT: Sergio Baker DOB: 12-29-1934  REASON FOR VISIT: follow up- Huntington's disease HISTORY FROM: patient  HISTORY OF PRESENT ILLNESS: Today 09/16/2016: Sergio Baker is a 82 year old male with a history of Huntington's disease. He returns today for follow-up. He continues on Haldol 1.5 mg twice a day. He reports that this is working well for him. His wife reports occasionally he'll have days where he has more movement than others but for the most part his symptoms are stable. Denies any significant changes with his gait. The patient reports that he did have a foot injury several months back. He has recovered well from this. Denies any new neurological symptoms. Returns today for an evaluation.  HISTORY 02/26/16: Sergio Baker is a 82 year old right-handed white male with a history of Huntington's disease. The patient has a daughter who is more severely affected than he is. The patient has had some problems with choreiform movements, he has had a gait disorder, no recent falls. The patient is on Haldol taking 1.5 mg twice daily. He is tolerating the medication well. He denies any issues with swallowing or choking. He has not had any significant memory issues. He returns to this office for an evaluation. He recently had a Bakers cyst in the left knee.  REVIEW OF SYSTEMS: Out of a complete 14 system review of symptoms, the patient complains only of the following symptoms, and all other reviewed systems are negative.  Hearing loss, daytime sleepiness, rash  ALLERGIES: No Known Allergies  HOME MEDICATIONS: Outpatient Medications Prior to Visit  Medication Sig Dispense Refill  . aspirin EC 81 MG tablet Take 81 mg by mouth daily.    Raelyn Ensign Pollen 550 MG CAPS Take 1 tablet by mouth.    . Calcium Carbonate-Vitamin D (CALCIUM 600/VITAMIN D PO) Take 1 tablet by mouth daily.    . Cholecalciferol (VITAMIN D3) 2000 UNITS capsule Take 2,000 Units by mouth daily.    . citalopram (CELEXA) 20 MG  tablet TAKE ONE (1) TABLET EACH DAY 30 tablet 5  . glucosamine-chondroitin 500-400 MG tablet Take 1 tablet by mouth daily.    . haloperidol (HALDOL) 1 MG tablet Take 1.5 tablets (1.5 mg total) by mouth 2 (two) times daily. 270 tablet 1  . levothyroxine (SYNTHROID, LEVOTHROID) 50 MCG tablet TAKE ONE (1) TABLET EACH DAY 90 tablet 3  . Multiple Vitamin (MULTIVITAMIN WITH MINERALS) TABS Take 1 tablet by mouth daily.    . Omega-3 Fatty Acids 1200 MG CAPS Take 1,200 capsules by mouth daily.    Marland Kitchen omeprazole (PRILOSEC) 20 MG capsule TAKE ONE (1) CAPSULE EACH DAY 90 capsule 1  . HYDROcodone-acetaminophen (LORTAB) 5-325 MG tablet Take 1 tablet by mouth every 6 (six) hours as needed for moderate pain. 30 tablet 0  . meloxicam (MOBIC) 15 MG tablet Take 1 tablet (15 mg total) by mouth daily. 30 tablet 1   No facility-administered medications prior to visit.     PAST MEDICAL HISTORY: Past Medical History:  Diagnosis Date  . A-fib ([place])   . Abnormality of gait   . Arthritis   . Depression   . Diverticula of colon   . Enlarged prostate   . GERD (gastroesophageal reflux disease)   . Hearing loss   . Hemorrhoids   . Hiatal hernia    small  . HOH (hard of hearing)   . Huntington's disease ([place]) 2010  . Hyperlipidemia   . Joint pain   . Memory deficits 01/17/2014  . Proctocolitis, ulcerative ([place])  Diagnosed in the 1980s.  . Schatzki's ring   . Ulcerative colitis ([place])    left side    PAST SURGICAL HISTORY: Past Surgical History:  Procedure Laterality Date  . APPENDECTOMY    . COLONOSCOPY  02/2010   patchy erythema, minimal granularity in distal rectum, left-sided diverticula, ileal diverticulum.  Bx benign. next TCS 02/2012  . COLONOSCOPY  03/24/2012   Dr. Gala Romney- inflammatory changes of the rectum and colon consistent with history of U/C. bx= benign colonic mucosa  . COLONOSCOPY N/A 10/05/2014   RMR: abnormal rectal and sigmoid mucosa as described above status post segmental biopsy. active  colitis sigmoid colon.  . cyst removed from urinary bladder    . ESOPHAGOGASTRODUODENOSCOPY  04/2008   schatzki ring, s/p ED, hh  . LUMBAR FUSION    . SHOULDER ARTHROSCOPY W/ ROTATOR CUFF REPAIR Bilateral   . TONSILLECTOMY      FAMILY HISTORY: Family History  Problem Relation Age of Onset  . Lung cancer Mother 38    deceased  . Colon cancer Neg Hx   . Other Brother     Posttraumatic stress disorder  . Cancer Brother     skin cancer  . Huntington's disease Daughter   . Cancer Sister     thyroid  . Arthritis Sister   . SIDS Brother     SOCIAL HISTORY: Social History   Social History  . Marital status: Married    Spouse name: [name]  . Number of children: 1  . Years of education: 12th   Occupational History  . retired Furniture conservator/restorer   . works as Administrator, [employer] at [employer]    Social History Main Topics  . Smoking status: Former Smoker    Packs/day: 0.25    Types: Cigarettes  . Smokeless tobacco: Never Used     Comment: Quit 1970's   . Alcohol use No  . Drug use: No  . Sexual activity: Not on file   Other Topics Concern  . Not on file   Social History Narrative   Patient lives at home with spouse.   Caffeine Use: 1 cup weekly   Right-handed      PHYSICAL EXAM  Vitals:   09/16/16 1456  BP: (!) 146/87  Pulse: 90  Weight: 157 lb (71.2 kg)  Height: 5' 5"  (1.651 m)   Body mass index is 26.13 kg/m.  MMSE - Mini Mental State Exam 09/16/2016 02/26/2016 08/22/2015  Orientation to time 5 4 5   Orientation to Place 5 5 4   Registration 3 3 3   Attention/ Calculation 5 5 5   Recall 3 3 3   Language- name 2 objects 2 2 2   Language- repeat 1 1 1   Language- follow 3 step command 3 3 2   Language- read & follow direction 1 1 1   Write a sentence 1 1 1   Copy design 1 1 1   Total score 30 29 28      Generalized: Well developed, in no acute distress   Neurological examination  Mentation: Alert oriented to time, place, history taking. Follows all commands speech and  language fluent Cranial nerve II-XII: Pupils were equal round reactive to light. Extraocular movements were full, visual field were full on confrontational test. Facial sensation and strength were normal. Uvula tongue midline. Head turning and shoulder shrug  were normal and symmetric. Motor: The motor testing reveals 5 over 5 strength of all 4 extremities. Good symmetric motor tone is noted throughout.  Sensory: Sensory testing is intact to soft touch on  all 4 extremities. No evidence of extinction is noted.  Coordination: Cerebellar testing reveals good finger-nose-finger and heel-to-shin bilaterally.  Gait and station: Gait is normal.  Reflexes: Deep tendon reflexes are symmetric and normal bilaterally.   DIAGNOSTIC DATA (LABS, IMAGING, TESTING) - I reviewed patient records, labs, notes, testing and imaging myself where available.  Lab Results  Component Value Date   WBC 6.7 06/19/2013   HGB 14.8 06/19/2013   HCT 44.9 06/19/2013   MCV 88.2 06/19/2013   PLT 228 11/01/2007      Component Value Date/Time   NA 142 07/03/2016 0917   K 3.9 07/03/2016 0917   CL 99 07/03/2016 0917   CO2 26 07/03/2016 0917   GLUCOSE 104 (H) 07/03/2016 0917   GLUCOSE 107 (H) 12/26/2012 0907   BUN 17 07/03/2016 0917   CREATININE 0.89 07/03/2016 0917   CREATININE 0.90 12/26/2012 0907   CALCIUM 9.4 07/03/2016 0917   PROT 7.0 07/03/2016 0917   ALBUMIN 4.7 07/03/2016 0917   AST 19 07/03/2016 0917   ALT 14 07/03/2016 0917   ALKPHOS 66 07/03/2016 0917   BILITOT 0.4 07/03/2016 0917   GFRNONAA 81 07/03/2016 0917   GFRNONAA 83 12/26/2012 0907   GFRAA 94 07/03/2016 0917   GFRAA >89 12/26/2012 0907   Lab Results  Component Value Date   CHOL 195 07/03/2016   HDL 35 (L) 07/03/2016   LDLCALC 131 (H) 07/03/2016   TRIG 145 07/03/2016   CHOLHDL 5.6 (H) 07/03/2016   Lab Results  Component Value Date   HGBA1C 6.1 09/05/2013   Lab Results  Component Value Date   VITAMINB12 607 11/21/2012   Lab Results    Component Value Date   TSH 3.410 07/03/2016      ASSESSMENT AND PLAN 82 y.o. year old male  has a past medical history of A-fib ([place]); Abnormality of gait; Arthritis; Depression; Diverticula of colon; Enlarged prostate; GERD (gastroesophageal reflux disease); Hearing loss; Hemorrhoids; Hiatal hernia; HOH (hard of hearing); Huntington's disease ([place]) (2010); Hyperlipidemia; Joint pain; Memory deficits (01/17/2014); Proctocolitis, ulcerative ([place]); Schatzki's ring; and Ulcerative colitis ([place]). here with:  1. Huntington's disease  Overall the patient is doing well. He will continue taking Haldol 1.5 mg twice a day. Advised patient that if his symptoms worsen or he develops new symptoms he should let us know. He will follow-up in 6 months or sooner if needed.  I spent 15 minutes with the patient 50% of this time was spent discussing new treatment options.     Ward Givens, MSN, NP-C 09/16/2016, 3:13 PM [facility] [address], [place], [place] [address] [phone]

## 2016-09-16 NOTE — Patient Instructions (Signed)
Continue Haldol  If your symptoms worsen or you develop new symptoms please let us know.

## 2016-09-21 ENCOUNTER — Ambulatory Visit: Payer: Medicare Other | Admitting: Pharmacist

## 2016-09-21 ENCOUNTER — Ambulatory Visit (INDEPENDENT_AMBULATORY_CARE_PROVIDER_SITE_OTHER): Payer: Medicare Other

## 2016-09-21 ENCOUNTER — Encounter: Payer: Self-pay | Admitting: Family Medicine

## 2016-09-21 ENCOUNTER — Ambulatory Visit (INDEPENDENT_AMBULATORY_CARE_PROVIDER_SITE_OTHER): Payer: Medicare Other | Admitting: Family Medicine

## 2016-09-21 VITALS — BP 149/86 | HR 76 | Temp 97.7°F | Ht 65.0 in | Wt 157.6 lb

## 2016-09-21 DIAGNOSIS — J209 Acute bronchitis, unspecified: Secondary | ICD-10-CM | POA: Diagnosis not present

## 2016-09-21 MED ORDER — AMOXICILLIN 500 MG PO CAPS: 500.0000 mg | ORAL_CAPSULE | Freq: Three times a day (TID) | ORAL | 0 refills | Status: DC

## 2016-09-21 MED ORDER — FLUTICASONE PROPIONATE 50 MCG/ACT NA SUSP: 2.0000 | Freq: Every day | NASAL | 0 refills | Status: DC

## 2016-09-21 NOTE — Progress Notes (Signed)
   HPI  Patient presents today he was coughing congestion.  Patient states that he's had symptoms for about one week. He complains of severe congestion, frequent throat clearing, and subsequent cough. He denies any chills, fever, sweats, or dyspnea.  He has a history of Huntington's disease with Huntington's coria, , GERD, A. fib, hyperlipidemia, and depression.  He's tolerating food and fluids normally.  PMH: Smoking status noted ROS: Per HPI  Objective: BP (!) 149/86   Pulse 76   Temp 97.7 F (36.5 C) (Oral)   Ht 5' 5"  (1.651 m)   Wt 157 lb 9.6 oz (71.5 kg)   BMI 26.23 kg/m  Gen: NAD, alert, cooperative with exam HEENT: NCAT, oropharynx moist and clear with slight erythema, hearing aids in place bilaterally, nares clear CV: RRR, good S1/S2, no murmur Resp: CTABL, no wheezes, non-labored Ext: No edema, warm Neuro: Alert and oriented, No gross deficits  Assessment and plan:  # 2 bronchitis Chest x-ray appears clear without any acute infiltrate Treat with amoxicillin. ( considering age and co-morbidities this i Discussed very low threshold for follow up if symptoms worsen or persist.  FLonase RTC with any concerns    Orders Placed This Encounter  Procedures  . DG Chest 2 View    Standing Status:   Future    Number of Occurrences:   1    Standing Expiration Date:   11/19/2017    Order Specific Question:   Reason for Exam (SYMPTOM  OR DIAGNOSIS REQUIRED)    Answer:   COugh, R/O CAP    Order Specific Question:   Preferred imaging location?    Answer:   Internal    Meds ordered this encounter  Medications  . amoxicillin (AMOXIL) 500 MG capsule    Sig: Take 1 capsule (500 mg total) by mouth 3 (three) times daily.    Dispense:  30 capsule    Refill:  0  . fluticasone (FLONASE) 50 MCG/ACT nasal spray    Sig: Place 2 sprays into both nostrils daily.    Dispense:  16 g    Refill:  [place], MD [facility] 09/21/2016, 12:03  PM

## 2016-09-21 NOTE — Patient Instructions (Signed)
Great to see you!  Be sure to finish all antibiotics.   Call or come back with any concerns  We will call with chest x ray results.   Consider trying flonase 2 sprays per nostril daily for 1-2 weeks   Acute Bronchitis, Adult Acute bronchitis is when air tubes (bronchi) in the lungs suddenly get swollen. The condition can make it hard to breathe. It can also cause these symptoms:  A cough.  Coughing up clear, yellow, or green mucus.  Wheezing.  Chest congestion.  Shortness of breath.  A fever.  Body aches.  Chills.  A sore throat. Follow these instructions at home: Medicines  Take over-the-counter and prescription medicines only as told by your doctor.  If you were prescribed an antibiotic medicine, take it as told by your doctor. Do not stop taking the antibiotic even if you start to feel better. General instructions  Rest.  Drink enough fluids to keep your pee (urine) clear or pale yellow.  Avoid smoking and secondhand smoke. If you smoke and you need help quitting, ask your doctor. Quitting will help your lungs heal faster.  Use an inhaler, cool mist vaporizer, or humidifier as told by your doctor.  Keep all follow-up visits as told by your doctor. This is important. How is this prevented? To lower your risk of getting this condition again:  Wash your hands often with soap and water. If you cannot use soap and water, use hand sanitizer.  Avoid contact with people who have cold symptoms.  Try not to touch your hands to your mouth, nose, or eyes.  Make sure to get the flu shot every year. Contact a doctor if:  Your symptoms do not get better in 2 weeks. Get help right away if:  You cough up blood.  You have chest pain.  You have very bad shortness of breath.  You become dehydrated.  You faint (pass out) or keep feeling like you are going to pass out.  You keep throwing up (vomiting).  You have a very bad headache.  Your fever or chills gets  worse. This information is not intended to replace advice given to you by your health care provider. Make sure you discuss any questions you have with your health care provider. Document Released: 01/20/2008 Document Revised: 03/11/2016 Document Reviewed: 01/22/2016 Elsevier Interactive Patient Education  2017 [employer].

## 2016-09-22 NOTE — Progress Notes (Signed)
Patient aware.

## 2016-09-24 DIAGNOSIS — H2513 Age-related nuclear cataract, bilateral: Secondary | ICD-10-CM | POA: Diagnosis not present

## 2016-09-24 DIAGNOSIS — H40033 Anatomical narrow angle, bilateral: Secondary | ICD-10-CM | POA: Diagnosis not present

## 2016-10-02 ENCOUNTER — Encounter: Payer: Self-pay | Admitting: Family Medicine

## 2016-10-02 ENCOUNTER — Ambulatory Visit (INDEPENDENT_AMBULATORY_CARE_PROVIDER_SITE_OTHER): Payer: Medicare Other | Admitting: Family Medicine

## 2016-10-02 VITALS — BP 134/76 | HR 95 | Temp 98.4°F | Ht 65.0 in | Wt 156.0 lb

## 2016-10-02 DIAGNOSIS — J189 Pneumonia, unspecified organism: Secondary | ICD-10-CM

## 2016-10-02 DIAGNOSIS — R6883 Chills (without fever): Secondary | ICD-10-CM

## 2016-10-02 DIAGNOSIS — R05 Cough: Secondary | ICD-10-CM | POA: Diagnosis not present

## 2016-10-02 DIAGNOSIS — R350 Frequency of micturition: Secondary | ICD-10-CM | POA: Diagnosis not present

## 2016-10-02 DIAGNOSIS — R0981 Nasal congestion: Secondary | ICD-10-CM | POA: Diagnosis not present

## 2016-10-02 LAB — URINALYSIS
Bilirubin, UA: NEGATIVE
GLUCOSE, UA: NEGATIVE
KETONES UA: NEGATIVE
LEUKOCYTES UA: NEGATIVE
Nitrite, UA: NEGATIVE
PROTEIN UA: NEGATIVE
Specific Gravity, UA: 1.015 (ref 1.005–1.030)
Urobilinogen, Ur: 0.2 mg/dL (ref 0.2–1.0)
pH, UA: 6.5 (ref 5.0–7.5)

## 2016-10-02 LAB — VERITOR FLU A/B WAIVED
INFLUENZA A: NEGATIVE
INFLUENZA B: NEGATIVE

## 2016-10-02 MED ORDER — DOXYCYCLINE HYCLATE 100 MG PO TABS: 100.0000 mg | ORAL_TABLET | Freq: Two times a day (BID) | ORAL | 0 refills | Status: DC

## 2016-10-02 NOTE — Progress Notes (Signed)
   HPI  Patient presents today with continued cough.  Patient and his wife are present, they explain that he's had a headache, cough, and sore throat. He is previously seen with primarily cough ingestion.  He has now developed headache, right ear pain, sore throat, and subjective fever and chills. Patient's temperature is been measured at 99.5.  He's tolerating food and fluids normally.  He seems to have slightly increased work of breathing at home. He also complains of frequent urination for about 2-3 days.  Patient had a chest x-ray last visit, this was largely clear, he was treated with amoxicillin.  PMH: Smoking status noted ROS: Per HPI  Objective: BP 134/76   Pulse 95   Temp 98.4 F (36.9 C) (Oral)   Ht 5' 5"  (1.651 m)   Wt 156 lb (70.8 kg)   BMI 25.96 kg/m  Gen: NAD, alert, cooperative with exam HEENT: NCAT, oropharynx moist and clear, TMs normal bilaterally, nares with mild erythema but clear. CV: RRR, good S1/S2, no murmur Resp: Soft crackles at the bases bilaterally, good air movement, nonlabored Abd: SNTND, BS present, no guarding or organomegaly and no CVA tenderness, no suprapubic tenderness to palpation Ext: No edema, warm Neuro: Alert and oriented, No gross deficits  Urinalysis is negative for nitrites and leukocytes, micro-not available  Assessment and plan:  # CAP Tx with Doxy, Non septic appearing Flu negative Pt with recent amox course and clear CXR Discusssed supportive care RTC with any concerns  Frequent urination Unlikely UTI, Continue to monitor.     Orders Placed This Encounter  Procedures  . Veritor Flu A/B Waived    Order Specific Question:   Source    Answer:   nasal  . Urinalysis    Meds ordered this encounter  Medications  . doxycycline (VIBRA-TABS) 100 MG tablet    Sig: Take 1 tablet (100 mg total) by mouth 2 (two) times daily. 1 po bid    Dispense:  20 tablet    Refill:  0    Laroy Apple, MD [place] 10/02/2016, 11:51 AM

## 2016-10-02 NOTE — Patient Instructions (Signed)
Great to see you!  Come back with any questions.    Community-Acquired Pneumonia, Adult Pneumonia is an infection of the lungs. There are different types of pneumonia. One type can develop while a person is in a hospital. A different type, called community-acquired pneumonia, develops in people who are not, or have not recently been, in the hospital or other health care facility. What are the causes? Pneumonia may be caused by bacteria, viruses, or funguses. Community-acquired pneumonia is often caused by Streptococcus pneumonia bacteria. These bacteria are often passed from one person to another by breathing in droplets from the cough or sneeze of an infected person. What increases the risk? The condition is more likely to develop in:  People who havechronic diseases, such as chronic obstructive pulmonary disease (COPD), asthma, congestive heart failure, cystic fibrosis, diabetes, or kidney disease.  People who haveearly-stage or late-stage HIV.  People who havesickle cell disease.  People who havehad their spleen removed (splenectomy).  People who havepoor Human resources officer.  People who havemedical conditions that increase the risk of breathing in (aspirating) secretions their own mouth and nose.  People who havea weakened immune system (immunocompromised).  People who smoke.  People whotravel to areas where pneumonia-causing germs commonly exist.  People whoare around animal habitats or animals that have pneumonia-causing germs, including birds, bats, rabbits, cats, and farm animals. What are the signs or symptoms? Symptoms of this condition include:  Adry cough.  A wet (productive) cough.  Fever.  Sweating.  Chest pain, especially when breathing deeply or coughing.  Rapid breathing or difficulty breathing.  Shortness of breath.  Shaking chills.  Fatigue.  Muscle aches. How is this diagnosed? Your health care provider will take a medical history and  perform a physical exam. You may also have other tests, including:  Imaging studies of your chest, including X-rays.  Tests to check your blood oxygen level and other blood gases.  Other tests on blood, mucus (sputum), fluid around your lungs (pleural fluid), and urine. If your pneumonia is severe, other tests may be done to identify the specific cause of your illness. How is this treated? The type of treatment that you receive depends on many factors, such as the cause of your pneumonia, the medicines you take, and other medical conditions that you have. For most adults, treatment and recovery from pneumonia may occur at home. In some cases, treatment must happen in a hospital. Treatment may include:  Antibiotic medicines, if the pneumonia was caused by bacteria.  Antiviral medicines, if the pneumonia was caused by a virus.  Medicines that are given by mouth or through an IV tube.  Oxygen.  Respiratory therapy. Although rare, treating severe pneumonia may include:  Mechanical ventilation. This is done if you are not breathing well on your own and you cannot maintain a safe blood oxygen level.  Thoracentesis. This procedureremoves fluid around one lung or both lungs to help you breathe better. Follow these instructions at home:  Take over-the-counter and prescription medicines only as told by your health care provider.  Only takecough medicine if you are losing sleep. Understand that cough medicine can prevent your body's natural ability to remove mucus from your lungs.  If you were prescribed an antibiotic medicine, take it as told by your health care provider. Do not stop taking the antibiotic even if you start to feel better.  Sleep in a semi-upright position at night. Try sleeping in a reclining chair, or place a few pillows under your head.  Do not use tobacco products, including cigarettes, chewing tobacco, and e-cigarettes. If you need help quitting, ask your health care  provider.  Drink enough water to keep your urine clear or pale yellow. This will help to thin out mucus secretions in your lungs. How is this prevented? There are ways that you can decrease your risk of developing community-acquired pneumonia. Consider getting a pneumococcal vaccine if:  You are older than 80 years of age.  You are older than 80 years of age and are undergoing cancer treatment, have chronic lung disease, or have other medical conditions that affect your immune system. Ask your health care provider if this applies to you. There are different types and schedules of pneumococcal vaccines. Ask your health care provider which vaccination option is best for you. You may also prevent community-acquired pneumonia if you take these actions:  Get an influenza vaccine every year. Ask your health care provider which type of influenza vaccine is best for you.  Go to the dentist on a regular basis.  Wash your hands often. Use hand sanitizer if soap and water are not available. Contact a health care provider if:  You have a fever.  You are losing sleep because you cannot control your cough with cough medicine. Get help right away if:  You have worsening shortness of breath.  You have increased chest pain.  Your sickness becomes worse, especially if you are an older adult or have a weakened immune system.  You cough up blood. This information is not intended to replace advice given to you by your health care provider. Make sure you discuss any questions you have with your health care provider. Document Released: 08/03/2005 Document Revised: 12/12/2015 Document Reviewed: 11/28/2014 Elsevier Interactive Patient Education  2017 [employer].

## 2016-10-05 ENCOUNTER — Encounter: Payer: Self-pay | Admitting: Pediatrics

## 2016-10-05 ENCOUNTER — Telehealth: Payer: Self-pay

## 2016-10-05 ENCOUNTER — Ambulatory Visit (INDEPENDENT_AMBULATORY_CARE_PROVIDER_SITE_OTHER): Payer: Medicare Other | Admitting: Pediatrics

## 2016-10-05 VITALS — BP 154/88 | HR 83 | Temp 97.1°F | Ht 65.0 in | Wt 154.0 lb

## 2016-10-05 DIAGNOSIS — E785 Hyperlipidemia, unspecified: Secondary | ICD-10-CM | POA: Diagnosis not present

## 2016-10-05 DIAGNOSIS — I10 Essential (primary) hypertension: Secondary | ICD-10-CM

## 2016-10-05 DIAGNOSIS — R51 Headache: Secondary | ICD-10-CM | POA: Diagnosis not present

## 2016-10-05 DIAGNOSIS — R519 Headache, unspecified: Secondary | ICD-10-CM

## 2016-10-05 MED ORDER — LISINOPRIL 10 MG PO TABS: 10.0000 mg | ORAL_TABLET | Freq: Every day | ORAL | 3 refills | Status: DC

## 2016-10-05 NOTE — Telephone Encounter (Signed)
Patient seen Sergio Baker Friday and wife states he is worse. Wife states that he has had a bad headache, eye pain and sore throat. She states patient has been in a "fog" and stumbling around more than normal. Wife states that he has no fever. Wants to know if he should come back in to be seen? Please advise

## 2016-10-05 NOTE — Progress Notes (Signed)
  Subjective:   Patient ID: Sergio Baker, male    DOB: Mar 26, 1935, 81 y.o.   MRN: [id number] CC: Recheck from last Friday (Headache and confusion)  HPI: Sergio Baker is a 82 y.o. male presenting for Recheck from last Friday (Headache and confusion)  Seen 3 days ago for pneumonia, started on doxycycline Now with headache Started yesterday Hurts all over his head, sometimes more R side Comes and goes, feels throbbing when at its worst Has h/o huntingtons chorea Here with his wife She thinks his thinking has been off He woke up form a nap, thought it was the next morning instead of the afternoon at first She thinks he has been more off balance than usual No fevers No on any medications for blood pressure  No weakness one side of the body compared with other  Relevant past medical, surgical, family and social history reviewed. Allergies and medications reviewed and updated. History  Smoking Status  . Former Smoker  . Packs/day: 0.25  . Types: Cigarettes  Smokeless Tobacco  . Never Used    Comment: Quit 1970's    ROS: Per HPI   Objective:    BP (!) 154/88   Pulse 83   Temp 97.1 F (36.2 C)   Ht 5' 5"  (1.651 m)   Wt 154 lb (69.9 kg)   BMI 25.63 kg/m   Wt Readings from Last 3 Encounters:  10/05/16 154 lb (69.9 kg)  10/02/16 156 lb (70.8 kg)  09/21/16 157 lb 9.6 oz (71.5 kg)    Gen: NAD, alert, cooperative with exam, NCAT EYES: EOMI, no conjunctival injection, or no icterus ENT:  TMs pearly gray b/l, OP without erythema LYMPH: no cervical LAD CV: NRRR, normal S1/S2, no murmur Resp: CTABL, no wheezes, normal WOB Abd: +BS, soft, NTND. no guarding or organomegaly Ext: No edema, warm Neuro: Alert, strength 5/5 hand grip, ext/flex elbow, knee, ankle Sensation intact b/l UE, LE CN III-XII intact  MMSE 25, missed for object recall, says we didn't ask the same 3 objects that are usually asked, and didn't know the exact date or day, though knew it was close to  valentine's day  Assessment & Plan:  [name] was seen today for recheck from last friday.  Diagnoses and all orders for this visit: Nonintractable headache, unspecified chronicity pattern, unspecified headache type No focal neuro deficits Persistently elevated BP, slightly higher today than it has been over last few months Will start lisinopril as below for BP If HA worsening, needs to be seen  Confusion Oriented though not able to say exact date and day, wife says this can be normal, able to describe events from earlier today and over the weekend Receiving treatment for pneumonia If any worsening needs to be seen  Essential hypertension Not on meds, start lisinopril, if any worsenin gin lightheadedness, balance, let us know F/u 1 week -     BMP8+EGFR -     CBC with Differential/Platelet -     lisinopril (PRINIVIL,ZESTRIL) 10 MG tablet; Take 1 tablet (10 mg total) by mouth daily.  Hyperlipidemia with target LDL less than 100 -     Lipid panel   Follow up plan: 1 week as scheduled, sooner if needed Assunta Found, MD [place]

## 2016-10-05 NOTE — Telephone Encounter (Signed)
Appointment made for 5pm today.

## 2016-10-05 NOTE — Telephone Encounter (Signed)
[place], MD [facility] 10/05/2016, 9:05 AM

## 2016-10-06 ENCOUNTER — Other Ambulatory Visit: Payer: Self-pay

## 2016-10-06 DIAGNOSIS — E039 Hypothyroidism, unspecified: Secondary | ICD-10-CM | POA: Diagnosis not present

## 2016-10-06 DIAGNOSIS — Z7982 Long term (current) use of aspirin: Secondary | ICD-10-CM | POA: Diagnosis not present

## 2016-10-06 DIAGNOSIS — R51 Headache: Secondary | ICD-10-CM | POA: Diagnosis not present

## 2016-10-06 DIAGNOSIS — R410 Disorientation, unspecified: Secondary | ICD-10-CM

## 2016-10-06 DIAGNOSIS — Z79899 Other long term (current) drug therapy: Secondary | ICD-10-CM | POA: Diagnosis not present

## 2016-10-07 DIAGNOSIS — R51 Headache: Secondary | ICD-10-CM | POA: Diagnosis not present

## 2016-10-07 LAB — CBC WITH DIFFERENTIAL/PLATELET
BASOS: 1 %
Basophils Absolute: 0.1 10*3/uL (ref 0.0–0.2)
EOS (ABSOLUTE): 0.2 10*3/uL (ref 0.0–0.4)
Eos: 2 %
Hematocrit: 49.3 % (ref 37.5–51.0)
Hemoglobin: 17.7 g/dL (ref 13.0–17.7)
Immature Grans (Abs): 0 10*3/uL (ref 0.0–0.1)
Immature Granulocytes: 0 %
Lymphocytes Absolute: 2.4 10*3/uL (ref 0.7–3.1)
Lymphs: 29 %
MCH: 33.8 pg — ABNORMAL HIGH (ref 26.6–33.0)
MCHC: 35.9 g/dL — ABNORMAL HIGH (ref 31.5–35.7)
MCV: 94 fL (ref 79–97)
MONOS ABS: 0.8 10*3/uL (ref 0.1–0.9)
Monocytes: 10 %
NEUTROS ABS: 4.7 10*3/uL (ref 1.4–7.0)
Neutrophils: 58 %
PLATELETS: 292 10*3/uL (ref 150–379)
RBC: 5.24 x10E6/uL (ref 4.14–5.80)
RDW: 14.4 % (ref 12.3–15.4)
WBC: 8.1 10*3/uL (ref 3.4–10.8)

## 2016-10-07 LAB — BMP8+EGFR
BUN/Creatinine Ratio: 16 (ref 10–24)
BUN: 15 mg/dL (ref 8–27)
CO2: 25 mmol/L (ref 18–29)
Calcium: 10 mg/dL (ref 8.6–10.2)
Chloride: 97 mmol/L (ref 96–106)
Creatinine, Ser: 0.92 mg/dL (ref 0.76–1.27)
GFR, EST AFRICAN AMERICAN: 91 (ref >59)
GFR, EST NON AFRICAN AMERICAN: 79 (ref >59)
Glucose: 93 mg/dL (ref 65–99)
POTASSIUM: 5.3 mmol/L — AB (ref 3.5–5.2)
SODIUM: 141 mmol/L (ref 134–144)

## 2016-10-07 LAB — LIPID PANEL
CHOL/HDL RATIO: 6.1 — AB (ref 0.0–5.0)
CHOLESTEROL TOTAL: 208 mg/dL — AB (ref 100–199)
HDL: 34 mg/dL — ABNORMAL LOW (ref >39)
LDL CALC: 151 — AB (ref 0–99)
Triglycerides: 113 mg/dL (ref 0–149)
VLDL CHOLESTEROL CAL: 23 (ref 5–40)

## 2016-10-08 ENCOUNTER — Telehealth: Payer: Self-pay | Admitting: Adult Health

## 2016-10-08 NOTE — Telephone Encounter (Signed)
Dr Jannifer Franklin- do you want pt to go to PCP first for new c/o HA first since he has not been seen here for this?

## 2016-10-08 NOTE — Telephone Encounter (Signed)
I called patient, left a message, I will call back later. 

## 2016-10-08 NOTE — Telephone Encounter (Signed)
I called the patient again, left a message again, will call back later.

## 2016-10-08 NOTE — Telephone Encounter (Signed)
Patients wife called office in reference to patient having an extremely bad headache.  Patient had a fall on Monday (already had headache prior to fall).  Tuesday patient was brought to the ER by wife due to headaches where they did complete a CT scan.  Patients wife is requesting patient to be seen ASAP.  Please call

## 2016-10-09 ENCOUNTER — Ambulatory Visit
Admission: RE | Admit: 2016-10-09 | Discharge: 2016-10-09 | Disposition: A | Discharge status: Home / Self Care | Payer: Medicare Other | Source: Ambulatory Visit | Attending: Neurology | Admitting: Neurology

## 2016-10-09 ENCOUNTER — Encounter: Payer: Self-pay | Admitting: Neurology

## 2016-10-09 ENCOUNTER — Ambulatory Visit (INDEPENDENT_AMBULATORY_CARE_PROVIDER_SITE_OTHER): Payer: Medicare Other | Admitting: Neurology

## 2016-10-09 VITALS — BP 140/79 | HR 86 | Ht 65.0 in | Wt 152.0 lb

## 2016-10-09 DIAGNOSIS — G10 Huntington's disease: Secondary | ICD-10-CM | POA: Diagnosis not present

## 2016-10-09 DIAGNOSIS — R269 Unspecified abnormalities of gait and mobility: Secondary | ICD-10-CM

## 2016-10-09 DIAGNOSIS — S060X0A Concussion without loss of consciousness, initial encounter: Secondary | ICD-10-CM | POA: Diagnosis not present

## 2016-10-09 DIAGNOSIS — R413 Other amnesia: Secondary | ICD-10-CM

## 2016-10-09 DIAGNOSIS — G4489 Other headache syndrome: Secondary | ICD-10-CM | POA: Diagnosis not present

## 2016-10-09 DIAGNOSIS — M542 Cervicalgia: Secondary | ICD-10-CM | POA: Diagnosis not present

## 2016-10-09 MED ORDER — PREDNISONE 5 MG PO TABS: ORAL_TABLET | ORAL | 0 refills | Status: DC

## 2016-10-09 NOTE — Telephone Encounter (Signed)
Placed pt on schedule today at 12pm per CW,MD request. Also called [facility] and spoke with Wells Guiles in medical records. She requested fax cover sheet be faxed to [phone] and she will fax back requested records per CW,MD. I faxed this as requested.

## 2016-10-09 NOTE — Patient Instructions (Addendum)
We will get an X-ray of the neck  And blood work today.

## 2016-10-09 NOTE — Telephone Encounter (Signed)
I called again, finally able to reach the wife. The patient apparently has been sick for about 2 weeks, he has had a sore throat, he began having a headache that came on gradually that was also associated with confusion. The patient is not running fevers, apparently went to [facility], he has been placed on amoxicillin, and then on doxycycline. He has been checked for influenza, this apparently was negative. He has had a CT scan of the brain that reportedly was okay. The patient was getting confused before he fell 4 days ago. He bumped the back of his head. The headaches continue, and the confusion continues. A lumbar puncture has not been done. He has not been complaining of stiff neck.  I will have the patient worked in to the office today. The patient does not usually have headaches.  The patient was noted to have an elevated blood pressure in the 150/100 range.  We will try to get the CT head report and blood work done in the emergency room at [facility] recently.

## 2016-10-09 NOTE — Progress Notes (Signed)
Reason for visit: Huntington's disease  Sergio Baker is an 82 y.o. male  History of present illness:  Sergio Baker is a 82 year old right-handed white male with a history of Huntington's disease. 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 Within the last 2 weeks, the patient has gotten sick to some degree, he has developed sinus drainage, and a sore throat. He has not been running any fevers. His wife believes that his had some slight increasing confusion since that time. He has developed a headache on the top of the head that is present, but not severe. Four days ago, the patient had a fall in his yard, he bumped his head, no loss of consciousness was noted. The patient has had some neck discomfort since that time, his headache continues and has worsened some. The wife believes that he is somewhat more confused. He typically will sleep 12 hours or more daily, this is normal for him, this has continued. The patient has had about 5 pound weight loss since he was seen at the end of January 2018. The patient comes to this office as an urgent work in. He has been seen through the emergency room on February 21, a CT scan of the brain was done and was unremarkable. Blood work showed only a low sodium level that was very slight, he has not had a sedimentation rate. In general, the patient has never had headaches throughout his life.  Past Medical History:  Diagnosis Date  . A-fib ([place])   . Abnormality of gait   . Arthritis   . Depression   . Diverticula of colon   . Enlarged prostate   . GERD (gastroesophageal reflux disease)   . Hearing loss   . Hemorrhoids   . Hiatal hernia    small  . HOH (hard of hearing)   . Huntington's disease ([place]) 2010  . Hyperlipidemia   . Joint pain   . Memory deficits 01/17/2014  . 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  . Schatzki's ring   . Ulcerative colitis ([place])    left side    Past Surgical History:  Procedure Laterality  Date  . APPENDECTOMY    . COLONOSCOPY  02/2010   patchy erythema, minimal granularity in distal rectum, left-sided diverticula, ileal diverticulum.  Bx benign. next TCS 02/2012  . COLONOSCOPY  03/24/2012   Dr. Gala Romney- inflammatory changes of the rectum and colon consistent with history of U/C. bx= benign colonic mucosa  . COLONOSCOPY N/A 10/05/2014   RMR: abnormal rectal and sigmoid mucosa as described above status post segmental biopsy. active colitis sigmoid colon.  . cyst removed from urinary bladder    . ESOPHAGOGASTRODUODENOSCOPY  04/2008   schatzki ring, s/p ED, hh  . LUMBAR FUSION    . SHOULDER ARTHROSCOPY W/ ROTATOR CUFF REPAIR Bilateral   . TONSILLECTOMY      Family History  Problem Relation Age of Onset  . Lung cancer Mother 69    deceased  . Other Brother     Posttraumatic stress disorder  . Cancer Brother     skin cancer  . Cancer Sister     thyroid  . Arthritis Sister   . SIDS Brother   . Huntington's disease Daughter   . Colon cancer Neg Hx     Social history:  reports that he has quit smoking. His smoking use included Cigarettes. He smoked 0.25 packs per day. He has never used smokeless tobacco. He  reports that he does not drink alcohol or use drugs.   No Known Allergies  Medications:  Prior to Admission medications   Medication Sig Start Date End Date Taking? Authorizing Provider  aspirin EC 81 MG tablet Take 81 mg by mouth daily.   Yes Historical Provider, MD  Bee Pollen 550 MG CAPS Take 1 tablet by mouth.   Yes Historical Provider, MD  Calcium Carbonate-Vitamin D (CALCIUM 600/VITAMIN D PO) Take 1 tablet by mouth daily.   Yes Historical Provider, MD  Cholecalciferol (VITAMIN D3) 2000 UNITS capsule Take 2,000 Units by mouth daily.   Yes Historical Provider, MD  citalopram (CELEXA) 20 MG tablet TAKE ONE (1) TABLET EACH DAY 07/03/16  Yes Mary-Margaret Hassell Done, FNP  doxycycline (VIBRA-TABS) 100 MG tablet Take 1 tablet (100 mg total) by mouth 2 (two) times daily. 1 po  bid 10/02/16  Yes Timmothy Euler, MD  fluticasone [facility]) 50 MCG/ACT nasal spray Place 2 sprays into both nostrils daily. 09/21/16  Yes Timmothy Euler, MD  glucosamine-chondroitin 500-400 MG tablet Take 1 tablet by mouth daily.   Yes Historical Provider, MD  haloperidol (HALDOL) 1 MG tablet Take 1.5 tablets (1.5 mg total) by mouth 2 (two) times daily. 07/03/16  Yes Mary-Margaret Hassell Done, FNP  levothyroxine (SYNTHROID, LEVOTHROID) 50 MCG tablet TAKE ONE (1) TABLET EACH DAY 12/11/15  Yes Mary-Margaret Hassell Done, FNP  lisinopril (PRINIVIL,ZESTRIL) 10 MG tablet Take 1 tablet (10 mg total) by mouth daily. 10/05/16  Yes Eustaquio Maize, MD  Multiple Vitamin (MULTIVITAMIN WITH MINERALS) TABS Take 1 tablet by mouth daily.   Yes Historical Provider, MD  Omega-3 Fatty Acids 1200 MG CAPS Take 1,200 capsules by mouth daily.   Yes Historical Provider, MD  omeprazole (PRILOSEC) 20 MG capsule TAKE ONE (1) CAPSULE EACH DAY 07/03/16  Yes Mary-Margaret Hassell Done, FNP  predniSONE (DELTASONE) 5 MG tablet Begin taking 6 tablets daily, taper by one tablet daily until off the medication. 10/09/16   Kathrynn Ducking, MD    ROS:  Out of a complete 14 system review of symptoms, the patient complains only of the following symptoms, and all other reviewed systems are negative.  Decreased activity, decreased appetite, fatigue, decreased weight Hearing loss, ear pain Light sensitivity Daytime sleepiness Back pain, walking difficulty Memory loss, weakness, tremors, confusion, anxiety  Blood pressure 140/79, pulse 86, height 5' 5"  (1.651 m), weight 152 lb (68.9 kg).  Physical Exam  General: The patient is alert and cooperative at the time of the examination.  Neuromuscular: The patient lacks about 20 of full lateral rotation of the cervical spine bilaterally.  Skin: No significant peripheral edema is noted.   Neurologic Exam  Mental status: The patient is alert and oriented x 3 at the time of the examination. The  patient has apparent normal recent and remote memory, with an apparently normal attention span and concentration ability. Mini-Mental Status Examination done today shows a total score of 30/30.   Cranial nerves: Facial symmetry is present. Speech is normal, no aphasia or dysarthria is noted. Extraocular movements are full. Visual fields are full.  Motor: The patient has good strength in all 4 extremities. The patient has incomplete abduction of the left shoulder.  Sensory examination: Soft touch sensation is symmetric on the face, arms, and legs.  Coordination: The patient has good finger-nose-finger and heel-to-shin bilaterally. Choreoathetotic movements were noted on all 4 extremities.  Gait and station: The patient has a wide-based gait, unsteady. Tandem gait was not attempted. Romberg is negative. No  drift is seen.  Reflexes: Deep tendon reflexes are symmetric.   Assessment/Plan:  1. Huntington's disease  2. Gait disorder  3. Mild memory disorder  4. Headache, new onset, possible mild concussion  The patient has had new onset headaches, he has had weight loss. The patient will need to be sent for blood work today to exclude temporal arteritis. The patient will be placed on a prednisone Dosepak for 6 days, if the headaches do not abate, MRI of the brain will be done. The patient is having neck pain following the fall, we will send him for an x-ray of the cervical spine.  Jill Alexanders MD 10/09/2016 12:28 PM  [facility] [address] [place], [place] [address]  Phone [phone] Fax [phone]

## 2016-10-09 NOTE — Telephone Encounter (Signed)
Received records from [facility]. Gave to CW,MD for review.

## 2016-10-10 LAB — C-REACTIVE PROTEIN: CRP: 1.8 mg/L (ref 0.0–4.9)

## 2016-10-10 LAB — SEDIMENTATION RATE: Sed Rate: 2 mm/hr (ref 0–30)

## 2016-10-11 ENCOUNTER — Telehealth: Payer: Self-pay | Admitting: Neurology

## 2016-10-11 NOTE — Telephone Encounter (Signed)
I called patient, talk with the wife. The x-ray of the neck shows degenerative changes, no fracture was seen.  The patient has gained some improvement with the headaches on prednisone, he still remains slightly confused. If the headaches worsen, she is to contact our office, we will check MRI of the brain.   C spine XR 10/09/16:  IMPRESSION: No acute or traumatic finding. Rather considerable degenerative changes throughout the cervical region, typically seen at this age.

## 2016-10-12 ENCOUNTER — Telehealth: Payer: Self-pay | Admitting: *Deleted

## 2016-10-12 DIAGNOSIS — R410 Disorientation, unspecified: Secondary | ICD-10-CM

## 2016-10-12 NOTE — Telephone Encounter (Signed)
-----   Message from Kathrynn Ducking, MD sent at 10/11/2016  5:36 PM EST -----  The blood work results are unremarkable. Please call the patient.  ----- Message ----- From: Lavone Neri Lab Results In Sent: 10/10/2016   7:40 AM To: Kathrynn Ducking, MD

## 2016-10-12 NOTE — Telephone Encounter (Signed)
Called and spoke to wife, [name] (on [place]) about lab results per CW,MD note.  She will call if pt finishes prednisone and headache still persists after finishing prednisone.

## 2016-10-13 ENCOUNTER — Ambulatory Visit (INDEPENDENT_AMBULATORY_CARE_PROVIDER_SITE_OTHER): Payer: Medicare Other | Admitting: Nurse Practitioner

## 2016-10-13 ENCOUNTER — Encounter: Payer: Self-pay | Admitting: Nurse Practitioner

## 2016-10-13 VITALS — BP 121/80 | HR 67 | Temp 96.9°F | Ht 65.0 in | Wt 151.0 lb

## 2016-10-13 DIAGNOSIS — E034 Atrophy of thyroid (acquired): Secondary | ICD-10-CM

## 2016-10-13 DIAGNOSIS — N400 Enlarged prostate without lower urinary tract symptoms: Secondary | ICD-10-CM

## 2016-10-13 DIAGNOSIS — F3342 Major depressive disorder, recurrent, in full remission: Secondary | ICD-10-CM | POA: Diagnosis not present

## 2016-10-13 DIAGNOSIS — G10 Huntington's disease: Secondary | ICD-10-CM

## 2016-10-13 DIAGNOSIS — K5120 Ulcerative (chronic) proctitis without complications: Secondary | ICD-10-CM

## 2016-10-13 DIAGNOSIS — I482 Chronic atrial fibrillation, unspecified: Secondary | ICD-10-CM

## 2016-10-13 DIAGNOSIS — K219 Gastro-esophageal reflux disease without esophagitis: Secondary | ICD-10-CM

## 2016-10-13 DIAGNOSIS — R413 Other amnesia: Secondary | ICD-10-CM

## 2016-10-13 DIAGNOSIS — E785 Hyperlipidemia, unspecified: Secondary | ICD-10-CM | POA: Diagnosis not present

## 2016-10-13 DIAGNOSIS — R269 Unspecified abnormalities of gait and mobility: Secondary | ICD-10-CM | POA: Diagnosis not present

## 2016-10-13 NOTE — Progress Notes (Signed)
Subjective:    Patient ID: Sergio Baker, male    DOB: 1935/03/18, 82 y.o.   MRN: [id number]   Patient here today for follow up of chronic medical problems. He says that he is doing well without complaints today.  Outpatient Encounter Prescriptions as of 10/13/2016  Medication Sig  . aspirin EC 81 MG tablet Take 81 mg by mouth daily.  Raelyn Ensign Pollen 550 MG CAPS Take 1 tablet by mouth.  . Calcium Carbonate-Vitamin D (CALCIUM 600/VITAMIN D PO) Take 1 tablet by mouth daily.  . Cholecalciferol (VITAMIN D3) 2000 UNITS capsule Take 2,000 Units by mouth daily.  . citalopram (CELEXA) 20 MG tablet TAKE ONE (1) TABLET EACH DAY  . doxycycline (VIBRA-TABS) 100 MG tablet Take 1 tablet (100 mg total) by mouth 2 (two) times daily. 1 po bid  . fluticasone (FLONASE) 50 MCG/ACT nasal spray Place 2 sprays into both nostrils daily.  Marland Kitchen glucosamine-chondroitin 500-400 MG tablet Take 1 tablet by mouth daily.  . haloperidol (HALDOL) 1 MG tablet Take 1.5 tablets (1.5 mg total) by mouth 2 (two) times daily.  Marland Kitchen levothyroxine (SYNTHROID, LEVOTHROID) 50 MCG tablet TAKE ONE (1) TABLET EACH DAY  . lisinopril (PRINIVIL,ZESTRIL) 10 MG tablet Take 1 tablet (10 mg total) by mouth daily.  . Multiple Vitamin (MULTIVITAMIN WITH MINERALS) TABS Take 1 tablet by mouth daily.  . Omega-3 Fatty Acids 1200 MG CAPS Take 1,200 capsules by mouth daily.  Marland Kitchen omeprazole (PRILOSEC) 20 MG capsule TAKE ONE (1) CAPSULE EACH DAY  . predniSONE (DELTASONE) 5 MG tablet Begin taking 6 tablets daily, taper by one tablet daily until off the medication.   No facility-administered encounter medications on file as of 10/13/2016.    * Patient was seen 10/05/16 with c/o headache and confusion- he saw his neurologist and was testing him for temporal arteritis which wife says was negative. According to chart he had a fall which he hit his head and they are saying slight concussion. But his headaches started before he fell and hit his head. Head CT done at  [facility] was negative. His neurologist put him on prednisone which has stopped headache. Wife says he is still confused at times. Neurologist said he would order MRI if confusion continues or worsens  Atrial Fibrillation  Presents for follow-up visit. Symptoms are negative for chest pain, shortness of breath and weakness. Past medical history includes atrial fibrillation and hyperlipidemia.  Hyperlipidemia  This is a chronic problem. The current episode started more than 1 year ago. Recent lipid tests were reviewed and are variable. Pertinent negatives include no chest pain or shortness of breath. Current antihyperlipidemic treatment includes statins. The current treatment provides moderate improvement of lipids. Compliance problems include adherence to diet and adherence to exercise.  Risk factors for coronary artery disease include dyslipidemia and male sex.   Huntington's/ gait abnormality No much change since last visit-Was encouraged to use a cane as gait has been more unsteady but has not felt the need to use it as he has not fallen. He sees neurologist every 3 months. Hypothyroidism  Currnetly on levothyroxin 50 mcg- doing well- no c/o fatigue Depression Currently on celexa which he says is working well without side effects. Hyperlipidemia  Patient has had for >10 years. He is currently taking crestor without any problems. He is trying to watch diet. GERD Omeprazole daily keeps symptoms under control Ulcerative colitis Has had recent flare up and was put on lialda which is helping Atrial fib currently on no  meds BPH Psa has been elevated and he is seeing urologist for follow up. Has some urinary hesistancy daily. HAs follow up in October. Memory deficit Wife feels that this is worsening some- discussed with neurologist and he did not feel that anything needed to be done at this time.  Review of Systems  Constitutional: Negative.   HENT: Negative.   Respiratory: Negative.  Negative  for chest tightness and shortness of breath.   Cardiovascular: Negative.  Negative for chest pain.  Gastrointestinal: Negative for abdominal pain and nausea.  Genitourinary: Negative.   Musculoskeletal: Positive for arthralgias and gait problem (Some unsteadiness, denies falls. ). Negative for joint swelling and neck stiffness.  Neurological: Negative for tremors, syncope and weakness.  Psychiatric/Behavioral: Negative.   All other systems reviewed and are negative.      Objective:   Physical Exam  Constitutional: He appears well-developed and well-nourished.  HENT:  Head: Normocephalic.  Right Ear: External ear normal.  Left Ear: External ear normal.  Nose: Nose normal.  Mouth/Throat: Oropharynx is clear and moist.  Eyes: EOM are normal. Pupils are equal, round, and reactive to light.  Neck: Normal range of motion. Neck supple. No JVD present. No thyromegaly present.  Cardiovascular: Normal rate, normal heart sounds and intact distal pulses.  Exam reveals no gallop and no friction rub.   No murmur heard. Irregular heart rate   Pulmonary/Chest: Effort normal and breath sounds normal. No respiratory distress. He has no wheezes. He has no rales. He exhibits no tenderness.  Abdominal: Soft. Bowel sounds are normal. He exhibits no mass. There is no tenderness.  Genitourinary: Prostate normal and penis normal.  Musculoskeletal: Normal range of motion. He exhibits no edema.  Constant jerking movements. Gait slightly unsteady.  Lymphadenopathy:    He has no cervical adenopathy.  Neurological: He is alert. No cranial nerve deficit. Abnormal gait: Unsteady gait.   Not oriented to place and time  Skin: Skin is warm and dry.  Psychiatric: He has a normal mood and affect. His speech is normal and behavior is normal. Judgment and thought content normal.  Patient appears very slepy  Vitals reviewed.  BP 121/80   Pulse 67   Temp (!) 96.9 F (36.1 C) (Oral)   Ht 5' 5"  (1.651 m)   Wt 151  lb (68.5 kg)   BMI 25.13 kg/m       Assessment & Plan:   1. Chronic atrial fibrillation ([place])   2. Gastroesophageal reflux disease, esophagitis presence not specified   3. 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 Hypothyroidism due to acquired atrophy of thyroid   5. Huntington's chorea ([place])   6. Benign prostatic hyperplasia without lower urinary tract symptoms   7. Abnormality of gait   8. Recurrent major depressive disorder, in full remission ([place])   9. Hyperlipidemia with target LDL less than 100   10. Memory deficits    keep follow up with neurology If confusion worsens need to go back to ER or call neurologist Information given on fall precautions in the home No labs needed to day Follow up in 3 months and prn  Mary-Margaret Hassell Done, FNP

## 2016-10-16 DIAGNOSIS — E039 Hypothyroidism, unspecified: Secondary | ICD-10-CM | POA: Diagnosis not present

## 2016-10-16 DIAGNOSIS — R4781 Slurred speech: Secondary | ICD-10-CM | POA: Diagnosis not present

## 2016-10-16 DIAGNOSIS — M1990 Unspecified osteoarthritis, unspecified site: Secondary | ICD-10-CM | POA: Diagnosis not present

## 2016-10-16 DIAGNOSIS — I4891 Unspecified atrial fibrillation: Secondary | ICD-10-CM | POA: Diagnosis not present

## 2016-10-16 DIAGNOSIS — R4701 Aphasia: Secondary | ICD-10-CM | POA: Diagnosis not present

## 2016-10-16 DIAGNOSIS — R51 Headache: Secondary | ICD-10-CM | POA: Diagnosis not present

## 2016-10-16 DIAGNOSIS — R531 Weakness: Secondary | ICD-10-CM | POA: Diagnosis not present

## 2016-10-16 DIAGNOSIS — R29818 Other symptoms and signs involving the nervous system: Secondary | ICD-10-CM | POA: Diagnosis not present

## 2016-10-16 DIAGNOSIS — Z5321 Procedure and treatment not carried out due to patient leaving prior to being seen by health care provider: Secondary | ICD-10-CM | POA: Diagnosis not present

## 2016-10-16 DIAGNOSIS — Z79899 Other long term (current) drug therapy: Secondary | ICD-10-CM | POA: Diagnosis not present

## 2016-10-16 DIAGNOSIS — R251 Tremor, unspecified: Secondary | ICD-10-CM | POA: Diagnosis not present

## 2016-10-16 DIAGNOSIS — Z7982 Long term (current) use of aspirin: Secondary | ICD-10-CM | POA: Diagnosis not present

## 2016-10-16 DIAGNOSIS — R410 Disorientation, unspecified: Secondary | ICD-10-CM | POA: Diagnosis not present

## 2016-10-19 ENCOUNTER — Ambulatory Visit
Admission: RE | Admit: 2016-10-19 | Discharge: 2016-10-19 | Disposition: A | Discharge status: Home / Self Care | Payer: Medicare Other | Source: Ambulatory Visit | Attending: Neurology | Admitting: Neurology

## 2016-10-19 DIAGNOSIS — R51 Headache: Secondary | ICD-10-CM | POA: Diagnosis not present

## 2016-10-19 DIAGNOSIS — R410 Disorientation, unspecified: Secondary | ICD-10-CM

## 2016-10-19 MED ORDER — PREDNISONE 5 MG PO TABS: ORAL_TABLET | ORAL | 0 refills | Status: DC

## 2016-10-19 NOTE — Telephone Encounter (Signed)
Patients wife [name] (listed on [place]). called office stating patient is still having headaches after completing prednisone.  Patients wife said patient had a couple of episodes went to ER on Friday on Saturday patient had another episode with right arm numbness, slurred speech. Patients wife would like to see about doing a scan of patients head.  Please call

## 2016-10-19 NOTE — Telephone Encounter (Signed)
I called the patient, I talk with the wife. The patient was placed on prednisone last week, this seemed to improve the headache some, but not completely eliminate the headaches. He seemed to function better on the prednisone.  His sedimentation rate came back as normal.  He began having episodes over the weekend of slurred speech, severe headache, confusion. He went to [facility], they tried to transfer him to [facility], [facility] was too busy. They tried to transfer him to [place], the patient did not wish to go there and signed out AMA.  The patient is not doing well, we will set up MRI evaluation as an outpatient, carotid Doppler study. The patient is on aspirin. I will give another trial on prednisone.  I did recommend going to the emergency room if another event occurs.

## 2016-10-19 NOTE — Telephone Encounter (Signed)
Dr Jannifer Franklin- please advise

## 2016-10-19 NOTE — Addendum Note (Signed)
Addended by: Margette Fast on: 10/19/2016 08:38 AM   Modules accepted: Orders

## 2016-10-20 ENCOUNTER — Telehealth: Payer: Self-pay | Admitting: Neurology

## 2016-10-20 DIAGNOSIS — R410 Disorientation, unspecified: Secondary | ICD-10-CM

## 2016-10-20 NOTE — Addendum Note (Signed)
Addended by: Margette Fast on: 10/20/2016 05:21 PM   Modules accepted: Orders

## 2016-10-20 NOTE — Telephone Encounter (Signed)
Patient's wife calling to get MRI results. She is very concerned.

## 2016-10-20 NOTE — Telephone Encounter (Signed)
Called and spoke with wife. Advised results not back yet. We will call as soon as they come back. I reassured wife. Advised I will send message to CW,MD so he can call once those come back.   She also wanted to know if patient should schedule US carotid. She referenced doppler, which I advised is the US carotid. She wants to know if she should wait to hear about MRI results first before scheduling this.

## 2016-10-20 NOTE — Telephone Encounter (Signed)
I called the patient, talk with the wife. I have seen the MRI the brain, the formal results are not yet available to me. I do not see any acute changes in the brain, minimal white matter changes. Nothing to explain confusion and headache.  The patient apparently is having intermittent episodes of confusion that come and go associated with slurred speech. He is to have a carotid Doppler study, I will check an EEG evaluation as well.

## 2016-10-21 ENCOUNTER — Telehealth: Payer: Self-pay | Admitting: Neurology

## 2016-10-21 NOTE — Telephone Encounter (Signed)
I have are a discussed the results with the wife, no acute changes are seen. We will check a carotid Doppler study, and an EEG study.   MRI brain 10/20/16:  IMPRESSION:  This MRI of the brain without contrast shows the following: 1.   Moderate cortical atrophy, a little more pronounced in the mesial temporal lobes and the parietal lobes. 2.   Minimal chronic microvascular ischemic change, normal for age. 3.   CSF spaces generous in the right parietal convexity and this could represent a subdural hygroma. 4.   There are no acute findings.

## 2016-10-22 ENCOUNTER — Ambulatory Visit: Payer: Medicare Other | Admitting: Pharmacist

## 2016-10-23 ENCOUNTER — Ambulatory Visit (INDEPENDENT_AMBULATORY_CARE_PROVIDER_SITE_OTHER): Payer: Medicare Other | Admitting: Neurology

## 2016-10-23 ENCOUNTER — Telehealth: Payer: Self-pay | Admitting: Neurology

## 2016-10-23 DIAGNOSIS — R410 Disorientation, unspecified: Secondary | ICD-10-CM

## 2016-10-23 NOTE — Telephone Encounter (Signed)
I called patient. EEG study was normal, carotid Doppler study is pending. Nothing yet has been found that would explain his confusion.

## 2016-10-23 NOTE — Procedures (Signed)
    History:  Sergio Baker is a 82 year old gentleman with a history of Huntington's disease. The patient has had some increased confusion that will come and go over the last several weeks. He has had some problems with weight loss and increased gait instability. He is being evaluated for this issue.  This is a routine EEG. No skull defects are noted. Medications include aspirin, calcium supplementation, Celexa, Flonase, Haldol, Synthroid, lisinopril, multivitamins, Prilosec, and prednisone.   EEG classification: Essentially normal awake  Description of the recording: The background rhythms of this recording consists of a fairly well modulated medium amplitude alpha rhythm of 8 Hz that is reactive to eye opening and closure. As the record progresses, the patient appears to remain in the waking state throughout the recording. Photic stimulation was performed, resulting in a bilateral and symmetric photic driving response. Hyperventilation was not performed. At no time during the recording does there appear to be evidence of spike or spike wave discharges or evidence of focal slowing. EKG monitor shows no evidence of cardiac rhythm abnormalities with a heart rate of 60.  Impression: This is an essentially normal EEG recording in the waking state. No evidence of ictal or interictal discharges are seen.

## 2016-10-29 ENCOUNTER — Ambulatory Visit (INDEPENDENT_AMBULATORY_CARE_PROVIDER_SITE_OTHER): Payer: Medicare Other

## 2016-10-29 DIAGNOSIS — R410 Disorientation, unspecified: Secondary | ICD-10-CM | POA: Diagnosis not present

## 2016-11-03 ENCOUNTER — Telehealth: Payer: Self-pay | Admitting: Neurology

## 2016-11-03 NOTE — Telephone Encounter (Signed)
Called, spoke to [place]. Verified pt had study 10/29/16. Advised results still pending. I will send message to CW,MD and he will call once results back. She verbalized understanding.

## 2016-11-03 NOTE — Telephone Encounter (Signed)
Patients wife is calling in reference to receiving results from Carotid doppler.  Please call

## 2016-11-06 NOTE — Telephone Encounter (Signed)
Pt's wife called back for results. I read msg from below to her and advised Dr Jannifer Franklin would call her when he gets the results and it could be 4-7 business days for them to be read and today was the 6th day. I advised her again Dr Jannifer Franklin would call with the results. She said ok

## 2016-11-11 ENCOUNTER — Telehealth: Payer: Self-pay | Admitting: Neurology

## 2016-11-11 NOTE — Telephone Encounter (Signed)
I called the wife. The carotid Doppler study is unremarkable, the patient seems to have improved, his headaches have gotten better, the confusion has improved. This may have been related to a viral illness.  The patient started being better at this time. We will watch the patient conservatively.

## 2016-11-18 DIAGNOSIS — Z981 Arthrodesis status: Secondary | ICD-10-CM | POA: Diagnosis not present

## 2016-11-18 DIAGNOSIS — M4316 Spondylolisthesis, lumbar region: Secondary | ICD-10-CM | POA: Diagnosis not present

## 2016-11-18 DIAGNOSIS — M5126 Other intervertebral disc displacement, lumbar region: Secondary | ICD-10-CM | POA: Diagnosis not present

## 2016-11-18 DIAGNOSIS — M549 Dorsalgia, unspecified: Secondary | ICD-10-CM | POA: Diagnosis not present

## 2016-11-19 DIAGNOSIS — M5126 Other intervertebral disc displacement, lumbar region: Secondary | ICD-10-CM | POA: Diagnosis not present

## 2016-11-19 DIAGNOSIS — M4727 Other spondylosis with radiculopathy, lumbosacral region: Secondary | ICD-10-CM | POA: Diagnosis not present

## 2016-11-26 ENCOUNTER — Other Ambulatory Visit: Payer: Self-pay | Admitting: Nurse Practitioner

## 2016-11-26 DIAGNOSIS — F3342 Major depressive disorder, recurrent, in full remission: Secondary | ICD-10-CM

## 2016-12-14 ENCOUNTER — Encounter: Payer: Self-pay | Admitting: Family Medicine

## 2016-12-14 ENCOUNTER — Ambulatory Visit (INDEPENDENT_AMBULATORY_CARE_PROVIDER_SITE_OTHER): Payer: Self-pay

## 2016-12-14 ENCOUNTER — Ambulatory Visit (INDEPENDENT_AMBULATORY_CARE_PROVIDER_SITE_OTHER): Payer: Self-pay | Admitting: Family Medicine

## 2016-12-14 VITALS — BP 139/88 | HR 67 | Temp 98.4°F | Ht 65.0 in | Wt 153.6 lb

## 2016-12-14 DIAGNOSIS — M25512 Pain in left shoulder: Secondary | ICD-10-CM

## 2016-12-14 DIAGNOSIS — M79642 Pain in left hand: Secondary | ICD-10-CM

## 2016-12-14 NOTE — Progress Notes (Signed)
BP 139/88   Pulse 67   Temp 98.4 F (36.9 C) (Oral)   Ht 5' 5"  (1.651 m)   Wt 153 lb 9.6 oz (69.7 kg)   BMI 25.56 kg/m    Subjective:    Patient ID: Sergio Baker, male    DOB: 27-Jan-1935, 82 y.o.   MRN: [id number]  HPI: Sergio Baker is a 82 y.o. male presenting on 12/14/2016 for Hand Pain (left, fell saturday night while eating out) and Shoulder Pain (left)   HPI Hand pain and shoulder pain  Patient comes in with complaints of left hand pain and left shoulder pain after falling 3 days ago. He says he was at a pizza place and slipped on a piece of pizza. He landed on an outstretched left arm and feels like he tweaked his shoulder and his hand. He had significant bruising and swelling over the dorsum of his left hand initially and that subsided but he still is slightly tender over the dorsum of his left hand and wrist. He still has some tenderness over the lateral aspect of the shoulder as well. He denies any numbness or weakness or difficulties with range of motion. He has been using a hand splint which has helped some with his left hand. He are he has decreased range of motion with the left shoulder but that has not changed from previous.  Relevant past medical, surgical, family and social history reviewed and updated as indicated. Interim medical history since our last visit reviewed. Allergies and medications reviewed and updated.  Review of Systems  Constitutional: Negative for chills and fever.  Respiratory: Negative for shortness of breath and wheezing.   Cardiovascular: Negative for chest pain and leg swelling.  Musculoskeletal: Positive for arthralgias. Negative for back pain, gait problem and joint swelling.  Skin: Negative for rash.  All other systems reviewed and are negative.   Per HPI unless specifically indicated above     Objective:    BP 139/88   Pulse 67   Temp 98.4 F (36.9 C) (Oral)   Ht 5' 5"  (1.651 m)   Wt 153 lb 9.6 oz (69.7 kg)   BMI 25.56  kg/m   Wt Readings from Last 3 Encounters:  12/14/16 153 lb 9.6 oz (69.7 kg)  10/13/16 151 lb (68.5 kg)  10/09/16 152 lb (68.9 kg)    Physical Exam  Constitutional: He is oriented to person, place, and time. He appears well-developed and well-nourished. No distress.  Eyes: Conjunctivae are normal. No scleral icterus.  Cardiovascular: Normal rate, regular rhythm, normal heart sounds and intact distal pulses.   No murmur heard. Pulmonary/Chest: Effort normal and breath sounds normal. No respiratory distress. He has no wheezes.  Musculoskeletal: Normal range of motion. He exhibits tenderness (Pain over base of second metacarpal). He exhibits no edema.  Neurological: He is alert and oriented to person, place, and time. Coordination normal.  Skin: Skin is warm and dry. No rash noted. He is not diaphoretic.  Psychiatric: He has a normal mood and affect. His behavior is normal.  Nursing note and vitals reviewed.  Left hand x-ray: Concern for possible fracture at base of second metacarpal. Await final read by radiologist, significant arthritic changes in the first carpometacarpal joint    Assessment & Plan:   Problem List Items Addressed This Visit    None    Visit Diagnoses    Acute pain of left shoulder    -  Primary   Relevant Orders  DG Shoulder Left   Hand pain, left       Relevant Orders   DG Hand Complete Left       Follow up plan: Return if symptoms worsen or fail to improve.  Counseling provided for all of the vaccine components Orders Placed This Encounter  Procedures  . DG Hand Complete Left  . DG Shoulder Left    Caryl Pina, MD [facility] 12/14/2016, 5:05 PM

## 2017-01-26 ENCOUNTER — Other Ambulatory Visit: Payer: Self-pay | Admitting: Nurse Practitioner

## 2017-01-26 DIAGNOSIS — E034 Atrophy of thyroid (acquired): Secondary | ICD-10-CM

## 2017-03-02 ENCOUNTER — Encounter: Payer: Self-pay | Admitting: Nurse Practitioner

## 2017-03-02 ENCOUNTER — Ambulatory Visit (INDEPENDENT_AMBULATORY_CARE_PROVIDER_SITE_OTHER): Payer: Medicare Other | Admitting: Nurse Practitioner

## 2017-03-02 VITALS — BP 132/74 | HR 64 | Temp 98.4°F | Ht 65.0 in | Wt 159.0 lb

## 2017-03-02 DIAGNOSIS — I482 Chronic atrial fibrillation, unspecified: Secondary | ICD-10-CM

## 2017-03-02 DIAGNOSIS — I10 Essential (primary) hypertension: Secondary | ICD-10-CM

## 2017-03-02 DIAGNOSIS — E034 Atrophy of thyroid (acquired): Secondary | ICD-10-CM | POA: Diagnosis not present

## 2017-03-02 DIAGNOSIS — K219 Gastro-esophageal reflux disease without esophagitis: Secondary | ICD-10-CM | POA: Diagnosis not present

## 2017-03-02 DIAGNOSIS — R269 Unspecified abnormalities of gait and mobility: Secondary | ICD-10-CM | POA: Diagnosis not present

## 2017-03-02 DIAGNOSIS — N400 Enlarged prostate without lower urinary tract symptoms: Secondary | ICD-10-CM

## 2017-03-02 DIAGNOSIS — E785 Hyperlipidemia, unspecified: Secondary | ICD-10-CM | POA: Diagnosis not present

## 2017-03-02 DIAGNOSIS — F3342 Major depressive disorder, recurrent, in full remission: Secondary | ICD-10-CM | POA: Diagnosis not present

## 2017-03-02 DIAGNOSIS — R413 Other amnesia: Secondary | ICD-10-CM

## 2017-03-02 DIAGNOSIS — Z6827 Body mass index (BMI) 27.0-27.9, adult: Secondary | ICD-10-CM

## 2017-03-02 DIAGNOSIS — G10 Huntington's disease: Secondary | ICD-10-CM | POA: Diagnosis not present

## 2017-03-02 MED ORDER — CITALOPRAM HYDROBROMIDE 20 MG PO TABS: ORAL_TABLET | ORAL | 1 refills | Status: DC

## 2017-03-02 MED ORDER — HALOPERIDOL 1 MG PO TABS: 1.5000 mg | ORAL_TABLET | Freq: Two times a day (BID) | ORAL | 1 refills | Status: DC

## 2017-03-02 MED ORDER — OMEPRAZOLE 20 MG PO CPDR: DELAYED_RELEASE_CAPSULE | ORAL | 1 refills | Status: DC

## 2017-03-02 MED ORDER — LEVOTHYROXINE SODIUM 50 MCG PO TABS: ORAL_TABLET | ORAL | 1 refills | Status: DC

## 2017-03-02 MED ORDER — LISINOPRIL 10 MG PO TABS: 10.0000 mg | ORAL_TABLET | Freq: Every day | ORAL | 1 refills | Status: DC

## 2017-03-02 NOTE — Patient Instructions (Signed)

## 2017-03-02 NOTE — Progress Notes (Signed)
Subjective:    Patient ID: Sergio Baker, male    DOB: [birth year], 82 y.o.   MRN: [id number]  HPI  Sergio Baker is here today for follow up of chronic medical problem.  Outpatient Encounter Prescriptions as of 03/02/2017  Medication Sig  . aspirin EC 81 MG tablet Take 81 mg by mouth daily.  Raelyn Ensign Pollen 550 MG CAPS Take 1 tablet by mouth.  . Calcium Carbonate-Vitamin D (CALCIUM 600/VITAMIN D PO) Take 1 tablet by mouth daily.  . Cholecalciferol (VITAMIN D3) 2000 UNITS capsule Take 2,000 Units by mouth daily.  . citalopram (CELEXA) 20 MG tablet TAKE ONE (1) TABLET EACH DAY  . glucosamine-chondroitin 500-400 MG tablet Take 1 tablet by mouth daily.  . haloperidol (HALDOL) 1 MG tablet Take 1.5 tablets (1.5 mg total) by mouth 2 (two) times daily.  Marland Kitchen levothyroxine (SYNTHROID, LEVOTHROID) 50 MCG tablet TAKE ONE (1) TABLET EACH DAY  . lisinopril (PRINIVIL,ZESTRIL) 10 MG tablet Take 1 tablet (10 mg total) by mouth daily.  . Multiple Vitamin (MULTIVITAMIN WITH MINERALS) TABS Take 1 tablet by mouth daily.  . Omega-3 Fatty Acids 1200 MG CAPS Take 1,200 capsules by mouth daily.  Marland Kitchen omeprazole (PRILOSEC) 20 MG capsule TAKE ONE (1) CAPSULE EACH DAY   No facility-administered encounter medications on file as of 03/02/2017.     1. Chronic atrial fibrillation (HCC)  No palpitations  2. Gastroesophageal reflux disease, esophagitis presence not specified  Omeprazole works well for symptoms  3. Hypothyroidism due to acquired atrophy of thyroid  No problems  4. Huntington's chorea [facility])  See neurologist every 3 months- seems to be doing okay  5. Benign prostatic hyperplasia without lower urinary tract symptoms  No problems voiding  6. Memory deficits  Currently holding his own- no worse but is also no better. Currently not taking anything  7. Hyperlipidemia with target LDL less than 100  Wife has to provide meals and she tries not to give him fried foods  8. Recurrent major depressive disorder,  in full remission ([place])  Is on celexa which is working well for him  9. BMI 27.0-27.9,adult  No recent weight changes  10. Abnormality of gait  Due to huntington chorea- doe snot use cane or walker. Has had no recent falls.    New complaints: None today  Social history: Daughter is dying from huntington's. He is the only family member she will let see her. SHe does not get along with his wife at all.    Review of Systems  Constitutional: Negative for activity change and appetite change.  HENT: Negative.   Eyes: Negative for pain.  Respiratory: Negative for shortness of breath.   Cardiovascular: Negative for chest pain, palpitations and leg swelling.  Gastrointestinal: Negative for abdominal pain.  Endocrine: Negative for polydipsia.  Genitourinary: Negative.   Musculoskeletal: Negative.   Skin: Negative for rash.  Neurological: Positive for tremors and weakness. Negative for dizziness and headaches.  Hematological: Does not bruise/bleed easily.  Psychiatric/Behavioral: Negative.   All other systems reviewed and are negative.      Objective:   Physical Exam  Constitutional: He is oriented to person, place, and time. He appears well-developed and well-nourished.  HENT:  Head: Normocephalic.  Right Ear: External ear normal.  Left Ear: External ear normal.  Nose: Nose normal.  Mouth/Throat: Oropharynx is clear and moist.  Eyes: Pupils are equal, round, and reactive to light. EOM are normal.  Neck: Normal range of motion. Neck supple. No JVD  present. No thyromegaly present.  Cardiovascular: Normal rate, regular rhythm, normal heart sounds and intact distal pulses.  Exam reveals no gallop and no friction rub.   No murmur heard. Pulmonary/Chest: Effort normal and breath sounds normal. No respiratory distress. He has no wheezes. He has no rales. He exhibits no tenderness.  Abdominal: Soft. Bowel sounds are normal. He exhibits no mass. There is no tenderness.  Genitourinary:  Prostate normal and penis normal.  Musculoskeletal: Normal range of motion. He exhibits no edema.  Gait slow and steady Very shaky- cannot sit still without jerking like motions  Lymphadenopathy:    He has no cervical adenopathy.  Neurological: He is alert and oriented to person, place, and time. No cranial nerve deficit.  Repeats hisself often  Skin: Skin is warm and dry.  Psychiatric: He has a normal mood and affect. His behavior is normal. Judgment and thought content normal.    BP 132/74   Pulse 64   Temp 98.4 F (36.9 C) (Oral)   Ht 5' 5"  (1.651 m)   Wt 159 lb (72.1 kg)   BMI 26.46 kg/m          Assessment & Plan:  1. Chronic atrial fibrillation (HCC) Avoid caffeine  2. Gastroesophageal reflux disease, esophagitis presence not specified Avoid spicy foods Do not eat 2 hours prior to bedtime - omeprazole (PRILOSEC) 20 MG capsule; TAKE ONE (1) CAPSULE EACH DAY  Dispense: 90 capsule; Refill: 1  3. Hypothyroidism due to acquired atrophy of thyroid - levothyroxine (SYNTHROID, LEVOTHROID) 50 MCG tablet; TAKE ONE (1) TABLET EACH DAY  Dispense: 90 tablet; Refill: 1 - Thyroid Panel With TSH  4. Huntington's chorea ([place]) Keep follow up with neurology - haloperidol (HALDOL) 1 MG tablet; Take 1.5 tablets (1.5 mg total) by mouth 2 (two) times daily.  Dispense: 270 tablet; Refill: 1  5. Benign prostatic hyperplasia without lower urinary tract symptoms Keep follow up appointment with urology next week  6. Memory deficits Continue to orient daily  7. Hyperlipidemia with target LDL less than 100 Low fat diet - Lipid panel  8. Recurrent major depressive disorder, in full remission ([place]) Stress management - citalopram (CELEXA) 20 MG tablet; TAKE ONE (1) TABLET EACH DAY  Dispense: 90 tablet; Refill: 1  9. BMI 27.0-27.9,adult Discussed diet and exercise for person with BMI >25 Will recheck weight in 3-6 months  10. Abnormality of gait Fall prevention  11. Essential  hypertension Low sodium diet - lisinopril (PRINIVIL,ZESTRIL) 10 MG tablet; Take 1 tablet (10 mg total) by mouth daily.  Dispense: 90 tablet; Refill: 1 - CMP14+EGFR    Labs pending Health maintenance reviewed Diet and exercise encouraged Continue all meds Follow up  In 3 months   [place], FNP

## 2017-03-03 LAB — LIPID PANEL
Chol/HDL Ratio: 5.7 ratio — ABNORMAL HIGH (ref 0.0–5.0)
Cholesterol, Total: 187 mg/dL (ref 100–199)
HDL: 33 mg/dL — ABNORMAL LOW (ref >39)
LDL Calculated: 128 mg/dL — ABNORMAL HIGH (ref 0–99)
Triglycerides: 129 mg/dL (ref 0–149)
VLDL Cholesterol Cal: 26 mg/dL (ref 5–40)

## 2017-03-03 LAB — CMP14+EGFR
A/G RATIO: 1.6 (ref 1.2–2.2)
ALBUMIN: 4.4 g/dL (ref 3.5–4.7)
ALK PHOS: 62 IU/L (ref 39–117)
ALT: 16 IU/L (ref 0–44)
AST: 23 IU/L (ref 0–40)
BILIRUBIN TOTAL: 0.7 mg/dL (ref 0.0–1.2)
BUN / CREAT RATIO: 16 (ref 10–24)
BUN: 14 mg/dL (ref 8–27)
CHLORIDE: 100 mmol/L (ref 96–106)
CO2: 26 mmol/L (ref 20–29)
Calcium: 9.6 mg/dL (ref 8.6–10.2)
Creatinine, Ser: 0.88 mg/dL (ref 0.76–1.27)
GFR calc non Af Amer: 81 mL/min/{1.73_m2} (ref >59)
GFR, EST AFRICAN AMERICAN: 94 mL/min/{1.73_m2} (ref >59)
Globulin, Total: 2.7 g/dL (ref 1.5–4.5)
Glucose: 99 mg/dL (ref 65–99)
POTASSIUM: 4.7 mmol/L (ref 3.5–5.2)
Sodium: 141 mmol/L (ref 134–144)
TOTAL PROTEIN: 7.1 g/dL (ref 6.0–8.5)

## 2017-03-03 LAB — THYROID PANEL WITH TSH
FREE THYROXINE INDEX: 2 (ref 1.2–4.9)
T3 UPTAKE RATIO: 30 % (ref 24–39)
T4 TOTAL: 6.5 ug/dL (ref 4.5–12.0)
TSH: 1.9 u[IU]/mL (ref 0.450–4.500)

## 2017-03-09 DIAGNOSIS — M4727 Other spondylosis with radiculopathy, lumbosacral region: Secondary | ICD-10-CM | POA: Diagnosis not present

## 2017-03-09 DIAGNOSIS — B379 Candidiasis, unspecified: Secondary | ICD-10-CM | POA: Diagnosis not present

## 2017-03-12 ENCOUNTER — Encounter: Payer: Self-pay | Admitting: Family Medicine

## 2017-03-12 ENCOUNTER — Ambulatory Visit (INDEPENDENT_AMBULATORY_CARE_PROVIDER_SITE_OTHER): Payer: Medicare Other | Admitting: Family Medicine

## 2017-03-12 VITALS — BP 148/88 | HR 69 | Temp 97.7°F | Ht 65.0 in | Wt 155.0 lb

## 2017-03-12 DIAGNOSIS — T162XXA Foreign body in left ear, initial encounter: Secondary | ICD-10-CM

## 2017-03-12 DIAGNOSIS — R972 Elevated prostate specific antigen [PSA]: Secondary | ICD-10-CM

## 2017-03-12 NOTE — Progress Notes (Signed)
Chief Complaint  Patient presents with  . Foreign Body in Ear    piece of hearing aid in left ear. Came off in his ear about an our ago    HPI  Patient presents today for Patient was adjusting his hearing aid and the speculum portion of it came off in the left ear canal. This happened about an hour ago. Patient brings in the right hearing aid for comparison. Patient is also due for a repeat of his PSA since it was elevated at 11 at his physical. The first repeat, done 3 months later was down to 5+. Repeat is due today as well  PMH: Smoking status noted ROS: Per HPI  Objective: BP (!) 148/88 (BP Location: Right Arm, Patient Position: Sitting, Cuff Size: Normal)   Pulse 69   Temp 97.7 F (36.5 C) (Oral)   Ht 5' 5"  (1.651 m)   Wt 155 lb (70.3 kg)   BMI 25.79 kg/m  Gen: NAD, alert, cooperative with exam HEENT: NCAT, EOMI, PERRL. The identification strip on the speculum was noted via otoscope. Using the alligator forceps the piece was captured and removed without difficulty.   Assessment and plan:  1. Elevated PSA, less than 10 ng/ml   2. Acute foreign body of left ear canal, initial encounter     The foreign body was removed without difficulty and the piece given to the patient with a caution to check with his audiologist before using it again to make sure it was secure.  Orders Placed This Encounter  Procedures  . PSA Total (Reflex To Free)    Follow up as needed.  Claretta Fraise, MD

## 2017-03-14 LAB — PSA TOTAL (REFLEX TO FREE): Prostate Specific Ag, Serum: 5.3 ng/mL — ABNORMAL HIGH (ref 0.0–4.0)

## 2017-03-14 LAB — FPSA% REFLEX
% FREE PSA: 21.7 %
PSA, FREE: 1.15 ng/mL

## 2017-03-22 DIAGNOSIS — N401 Enlarged prostate with lower urinary tract symptoms: Secondary | ICD-10-CM | POA: Diagnosis not present

## 2017-03-22 DIAGNOSIS — R3914 Feeling of incomplete bladder emptying: Secondary | ICD-10-CM | POA: Diagnosis not present

## 2017-03-22 DIAGNOSIS — R972 Elevated prostate specific antigen [PSA]: Secondary | ICD-10-CM | POA: Diagnosis not present

## 2017-03-23 ENCOUNTER — Ambulatory Visit (INDEPENDENT_AMBULATORY_CARE_PROVIDER_SITE_OTHER): Payer: Medicare Other | Admitting: Adult Health

## 2017-03-23 ENCOUNTER — Encounter: Payer: Self-pay | Admitting: Adult Health

## 2017-03-23 VITALS — BP 153/89 | HR 78 | Ht 66.0 in | Wt 156.5 lb

## 2017-03-23 DIAGNOSIS — R413 Other amnesia: Secondary | ICD-10-CM

## 2017-03-23 DIAGNOSIS — G10 Huntington's disease: Secondary | ICD-10-CM | POA: Diagnosis not present

## 2017-03-23 NOTE — Progress Notes (Signed)
I have read the note, and I agree with the clinical assessment and plan.  Vedanth Sirico KEITH   

## 2017-03-23 NOTE — Progress Notes (Signed)
PATIENT: Sergio Baker DOB: Oct 18, 1934  REASON FOR VISIT: follow up-Huntington's disease HISTORY FROM: patient  HISTORY OF PRESENT ILLNESS: Today 03/23/17 Sergio Baker is an 82 year old male with a history of Huntington disease as well as memory disturbance. He returns today for follow-up. He remains on Haldol and is tolerating it well. He reports that this controls his involuntary movements well. He denies any significant changes with his memory. In February the patient developed a headache and confusion. He had a thorough workup including MRI, labs and carotid Dopplers all of which was unremarkable. He reports that it was determined that perhaps he had a sinus infection? Patient states that he has since returned to his baseline. Denies headache or increasing confusion. He reports him and his wife just celebrated a 18 year anniversary. He denies any new symptoms. He returns today for an evaluation.  HISTORY 10/09/16: Sergio Baker is a 82 year old right-handed white male with a history of Huntington's disease. 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 Within the last 2 weeks, the patient has gotten sick to some degree, he has developed sinus drainage, and a sore throat. He has not been running any fevers. His wife believes that his had some slight increasing confusion since that time. He has developed a headache on the top of the head that is present, but not severe. Four days ago, the patient had a fall in his yard, he bumped his head, no loss of consciousness was noted. The patient has had some neck discomfort since that time, his headache continues and has worsened some. The wife believes that he is somewhat more confused. He typically will sleep 12 hours or more daily, this is normal for him, this has continued. The patient has had about 5 pound weight loss since he was seen at the end of January 2018. The patient comes to this office as an urgent work in. He has been seen through the  emergency room on February 21, a CT scan of the brain was done and was unremarkable. Blood work showed only a low sodium level that was very slight, he has not had a sedimentation rate. In general, the patient has never had headaches throughout his life.   REVIEW OF SYSTEMS: Out of a complete 14 system review of symptoms, the patient complains only of the following symptoms, and all other reviewed systems are negative.  Incontinence of bladder, frequency of urination, urgency, joint pain, joint swelling, back pain, walking difficulty, depression, nervous/anxious, moles, daytime sleepiness, snoring, restless leg, hearing loss  ALLERGIES: No Known Allergies  HOME MEDICATIONS: Outpatient Medications Prior to Visit  Medication Sig Dispense Refill  . aspirin EC 81 MG tablet Take 81 mg by mouth daily.    . Calcium Carbonate-Vitamin D (CALCIUM 600/VITAMIN D PO) Take 1 tablet by mouth daily.    . Cholecalciferol (VITAMIN D3) 2000 UNITS capsule Take 2,000 Units by mouth daily.    . citalopram (CELEXA) 20 MG tablet TAKE ONE (1) TABLET EACH DAY 90 tablet 1  . glucosamine-chondroitin 500-400 MG tablet Take 1 tablet by mouth daily.    . haloperidol (HALDOL) 1 MG tablet Take 1.5 tablets (1.5 mg total) by mouth 2 (two) times daily. 270 tablet 1  . levothyroxine (SYNTHROID, LEVOTHROID) 50 MCG tablet TAKE ONE (1) TABLET EACH DAY 90 tablet 1  . Multiple Vitamin (MULTIVITAMIN WITH MINERALS) TABS Take 1 tablet by mouth daily.    . Omega-3 Fatty Acids 1200 MG CAPS Take 1,200 capsules by mouth daily.    Marland Kitchen  omeprazole (PRILOSEC) 20 MG capsule TAKE ONE (1) CAPSULE EACH DAY 90 capsule 1  . lisinopril (PRINIVIL,ZESTRIL) 10 MG tablet Take 1 tablet (10 mg total) by mouth daily. 90 tablet 1   No facility-administered medications prior to visit.     PAST MEDICAL HISTORY: Past Medical History:  Diagnosis Date  . A-fib ([place])   . Abnormality of gait   . Arthritis   . Depression   . Diverticula of colon   .  Enlarged prostate   . GERD (gastroesophageal reflux disease)   . Hearing loss   . Hemorrhoids   . Hiatal hernia    small  . HOH (hard of hearing)   . Huntington's disease ([place]) 2010  . Hyperlipidemia   . Joint pain   . Memory deficits 01/17/2014  . 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  . Schatzki's ring   . Ulcerative colitis ([place])    left side    PAST SURGICAL HISTORY: Past Surgical History:  Procedure Laterality Date  . APPENDECTOMY    . COLONOSCOPY  02/2010   patchy erythema, minimal granularity in distal rectum, left-sided diverticula, ileal diverticulum.  Bx benign. next TCS 02/2012  . COLONOSCOPY  03/24/2012   Dr. Gala Romney- inflammatory changes of the rectum and colon consistent with history of U/C. bx= benign colonic mucosa  . COLONOSCOPY N/A 10/05/2014   RMR: abnormal rectal and sigmoid mucosa as described above status post segmental biopsy. active colitis sigmoid colon.  . cyst removed from urinary bladder    . ESOPHAGOGASTRODUODENOSCOPY  04/2008   schatzki ring, s/p ED, hh  . LUMBAR FUSION    . SHOULDER ARTHROSCOPY W/ ROTATOR CUFF REPAIR Bilateral   . TONSILLECTOMY      FAMILY HISTORY: Family History  Problem Relation Age of Onset  . Lung cancer Mother 23       deceased  . Other Brother        Posttraumatic stress disorder  . Cancer Brother        skin cancer  . Cancer Sister        thyroid  . Arthritis Sister   . SIDS Brother   . Huntington's disease Daughter   . Colon cancer Neg Hx     SOCIAL HISTORY: Social History   Social History  . Marital status: Married    Spouse name: [name]  . Number of children: 1  . Years of education: 12th   Occupational History  . retired Furniture conservator/restorer   . works as Administrator, [employer] at [employer]    Social History Main Topics  . Smoking status: Former Smoker    Packs/day: 0.25    Types: Cigarettes  . Smokeless tobacco: Never Used     Comment: Quit 1970's   . Alcohol use No  . Drug use: No  . Sexual  activity: Not on file   Other Topics Concern  . Not on file   Social History Narrative   Patient lives at home with spouse.   Caffeine Use: 1 cup weekly   Right-handed      PHYSICAL EXAM  Vitals:   03/23/17 1334  BP: (!) 153/89  Pulse: 78  Weight: 156 lb 8 oz (71 kg)  Height: 5' 6"  (1.676 m)   Body mass index is 25.26 kg/m.   MMSE - Mini Mental State Exam 03/23/2017 10/09/2016 10/05/2016  Orientation to time 5 5 3   Orientation to Place 5 5 5   Registration 3 3 2   Attention/ Calculation 5 5  5  Recall 3 3 1   Language- name 2 objects 2 2 2   Language- repeat 1 1 1   Language- follow 3 step command 3 3 3   Language- read & follow direction 1 1 1   Write a sentence 1 1 1   Copy design 1 1 1   Total score 30 30 25      Generalized: Well developed, in no acute distress   Neurological examination  Mentation: Alert oriented to time, place, history taking. Follows all commands speech and language fluent Cranial nerve II-XII: Pupils were equal round reactive to light. Extraocular movements were full, visual field were full on confrontational test. Facial sensation and strength were normal. Uvula tongue midline. Head turning and shoulder shrug  were normal and symmetric. Motor: The motor testing reveals 5 over 5 strength of all 4 extremities.Limited range of motion in the left shoulder. Good symmetric motor tone is noted throughout.  Sensory: Sensory testing is intact to soft touch on all 4 extremities. No evidence of extinction is noted.  Coordination: Cerebellar testing reveals good finger-nose-finger and heel-to-shin bilaterally.  Gait and station: Gait is wide-based. Tandem gait not attempted. Reflexes: Deep tendon reflexes are symmetric and normal bilaterally.   DIAGNOSTIC DATA (LABS, IMAGING, TESTING) - I reviewed patient records, labs, notes, testing and imaging myself where available.  Lab Results  Component Value Date   WBC 8.1 10/05/2016   HGB 17.7 10/05/2016   HCT 49.3  10/05/2016   MCV 94 10/05/2016   PLT 292 10/05/2016      Component Value Date/Time   NA 141 03/02/2017 1457   K 4.7 03/02/2017 1457   CL 100 03/02/2017 1457   CO2 26 03/02/2017 1457   GLUCOSE 99 03/02/2017 1457   GLUCOSE 107 (H) 12/26/2012 0907   BUN 14 03/02/2017 1457   CREATININE 0.88 03/02/2017 1457   CREATININE 0.90 12/26/2012 0907   CALCIUM 9.6 03/02/2017 1457   PROT 7.1 03/02/2017 1457   ALBUMIN 4.4 03/02/2017 1457   AST 23 03/02/2017 1457   ALT 16 03/02/2017 1457   ALKPHOS 62 03/02/2017 1457   BILITOT 0.7 03/02/2017 1457   GFRNONAA 81 03/02/2017 1457   GFRNONAA 83 12/26/2012 0907   GFRAA 94 03/02/2017 1457   GFRAA >89 12/26/2012 0907   Lab Results  Component Value Date   CHOL 187 03/02/2017   HDL 33 (L) 03/02/2017   LDLCALC 128 (H) 03/02/2017   TRIG 129 03/02/2017   CHOLHDL 5.7 (H) 03/02/2017   Lab Results  Component Value Date   HGBA1C 6.1 09/05/2013   Lab Results  Component Value Date   VITAMINB12 607 11/21/2012   Lab Results  Component Value Date   TSH 1.900 03/02/2017      ASSESSMENT AND PLAN 82 y.o. 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 here with:  1. Huntington's disease 2. Memory disturbance  Overall the patient has done well. He will continue on Haldol. His memory score has remained stable. We will continue to monitor his symptoms. He is advised that if his symptoms worsen or he develops new symptoms he should let us know. He will follow-up in 6 months or sooner if needed.    Ward Givens, MSN, NP-C 03/23/2017, 2:12 PM [facility]  [address]  [place], [place] [address] [phone]

## 2017-03-23 NOTE — Patient Instructions (Signed)
Continue Haldol If your symptoms worsen or you develop new symptoms please let us know.

## 2017-03-25 DIAGNOSIS — H2513 Age-related nuclear cataract, bilateral: Secondary | ICD-10-CM | POA: Diagnosis not present

## 2017-04-28 NOTE — Patient Instructions (Signed)
Your procedure is scheduled on:  05/07/2017  Report to [facility] at  640  AM.  Call this number if you have problems the morning of surgery: [phone]   Do not eat food or drink liquids :After Midnight.      Take these medicines the morning of surgery with A SIP OF WATER: celexa, haldol, levothyroxine, prilosec.   Do not wear jewelry, make-up or nail polish.  Do not wear lotions, powders, or perfumes. You may wear deodorant.  Do not shave 48 hours prior to surgery.  Do not bring valuables to the hospital.  Contacts, dentures or bridgework may not be worn into surgery.  Leave suitcase in the car. After surgery it may be brought to your room.  For patients admitted to the hospital, checkout time is 11:00 AM the day of discharge.   Patients discharged the day of surgery will not be allowed to drive home.  :     Please read over the following fact sheets that you were given: Coughing and Deep Breathing, Surgical Site Infection Prevention, Anesthesia Post-op Instructions and Care and Recovery After Surgery    Cataract A cataract is a clouding of the lens of the eye. When a lens becomes cloudy, vision is reduced based on the degree and nature of the clouding. Many cataracts reduce vision to some degree. Some cataracts make people more near-sighted as they develop. Other cataracts increase glare. Cataracts that are ignored and become worse can sometimes look white. The white color can be seen through the pupil. CAUSES   Aging. However, cataracts may occur at any age, even in newborns.   Certain drugs.   Trauma to the eye.   Certain diseases such as diabetes.   Specific eye diseases such as chronic inflammation inside the eye or a sudden attack of a rare form of glaucoma.   Inherited or acquired medical problems.  SYMPTOMS   Gradual, progressive drop in vision in the affected eye.   Severe, rapid visual loss. This most often happens when trauma is the cause.  DIAGNOSIS  To detect  a cataract, an eye doctor examines the lens. Cataracts are best diagnosed with an exam of the eyes with the pupils enlarged (dilated) by drops.  TREATMENT  For an early cataract, vision may improve by using different eyeglasses or stronger lighting. If that does not help your vision, surgery is the only effective treatment. A cataract needs to be surgically removed when vision loss interferes with your everyday activities, such as driving, reading, or watching TV. A cataract may also have to be removed if it prevents examination or treatment of another eye problem. Surgery removes the cloudy lens and usually replaces it with a substitute lens (intraocular lens, IOL).  At a time when both you and your doctor agree, the cataract will be surgically removed. If you have cataracts in both eyes, only one is usually removed at a time. This allows the operated eye to heal and be out of danger from any possible problems after surgery (such as infection or poor wound healing). In rare cases, a cataract may be doing damage to your eye. In these cases, your caregiver may advise surgical removal right away. The vast majority of people who have cataract surgery have better vision afterward. HOME CARE INSTRUCTIONS  If you are not planning surgery, you may be asked to do the following:  Use different eyeglasses.   Use stronger or brighter lighting.   Ask your eye doctor about reducing  your medicine dose or changing medicines if it is thought that a medicine caused your cataract. Changing medicines does not make the cataract go away on its own.   Become familiar with your surroundings. Poor vision can lead to injury. Avoid bumping into things on the affected side. You are at a higher risk for tripping or falling.   Exercise extreme care when driving or operating machinery.   Wear sunglasses if you are sensitive to bright light or experiencing problems with glare.  SEEK IMMEDIATE MEDICAL CARE IF:   You have a  worsening or sudden vision loss.   You notice redness, swelling, or increasing pain in the eye.   You have a fever.  Document Released: 08/03/2005 Document Revised: 07/23/2011 Document Reviewed: 03/27/2011 [facility] Patient Information 2012 [place].PATIENT INSTRUCTIONS POST-ANESTHESIA  IMMEDIATELY FOLLOWING SURGERY:  Do not drive or operate machinery for the first twenty four hours after surgery.  Do not make any important decisions for twenty four hours after surgery or while taking narcotic pain medications or sedatives.  If you develop intractable nausea and vomiting or a severe headache please notify your doctor immediately.  FOLLOW-UP:  Please make an appointment with your surgeon as instructed. You do not need to follow up with anesthesia unless specifically instructed to do so.  WOUND CARE INSTRUCTIONS (if applicable):  Keep a dry clean dressing on the anesthesia/puncture wound site if there is drainage.  Once the wound has quit draining you may leave it open to air.  Generally you should leave the bandage intact for twenty four hours unless there is drainage.  If the epidural site drains for more than 36-48 hours please call the anesthesia department.  QUESTIONS?:  Please feel free to call your physician or the hospital operator if you have any questions, and they will be happy to assist you.

## 2017-04-30 DIAGNOSIS — H2512 Age-related nuclear cataract, left eye: Secondary | ICD-10-CM | POA: Diagnosis not present

## 2017-05-03 ENCOUNTER — Encounter (HOSPITAL_COMMUNITY)
Admission: RE | Admit: 2017-05-03 | Discharge: 2017-05-03 | Disposition: A | Discharge status: Home / Self Care | Payer: Medicare Other | Source: Ambulatory Visit | Attending: Ophthalmology | Admitting: Ophthalmology

## 2017-05-03 ENCOUNTER — Encounter (HOSPITAL_COMMUNITY): Payer: Self-pay

## 2017-05-03 DIAGNOSIS — Z01812 Encounter for preprocedural laboratory examination: Secondary | ICD-10-CM | POA: Insufficient documentation

## 2017-05-03 DIAGNOSIS — Z01810 Encounter for preprocedural cardiovascular examination: Secondary | ICD-10-CM | POA: Insufficient documentation

## 2017-05-03 DIAGNOSIS — H25812 Combined forms of age-related cataract, left eye: Secondary | ICD-10-CM | POA: Diagnosis not present

## 2017-05-03 HISTORY — DX: Cardiac arrhythmia, unspecified: I49.9

## 2017-05-03 HISTORY — DX: Hypothyroidism, unspecified: E03.9

## 2017-05-03 LAB — BASIC METABOLIC PANEL
ANION GAP: 9 (ref 5–15)
BUN: 18 mg/dL (ref 6–20)
CALCIUM: 9.4 mg/dL (ref 8.9–10.3)
CO2: 29 mmol/L (ref 22–32)
CREATININE: 0.85 mg/dL (ref 0.61–1.24)
Chloride: 102 mmol/L (ref 101–111)
GFR calc non Af Amer: >60 mL/min (ref >60)
Glucose, Bld: 96 mg/dL (ref 65–99)
Potassium: 4.2 mmol/L (ref 3.5–5.1)
Sodium: 140 mmol/L (ref 135–145)

## 2017-05-03 LAB — CBC WITH DIFFERENTIAL/PLATELET
Basophils Absolute: 0 10*3/uL (ref 0.0–0.1)
Basophils Relative: 1 %
Eosinophils Absolute: 0.1 10*3/uL (ref 0.0–0.7)
Eosinophils Relative: 3 %
HCT: 47 % (ref 39.0–52.0)
HEMOGLOBIN: 16.5 g/dL (ref 13.0–17.0)
LYMPHS ABS: 1.7 10*3/uL (ref 0.7–4.0)
LYMPHS PCT: 33 %
MCH: 33.5 pg (ref 26.0–34.0)
MCHC: 35.1 g/dL (ref 30.0–36.0)
MCV: 95.3 fL (ref 78.0–100.0)
MONOS PCT: 15 %
Monocytes Absolute: 0.8 10*3/uL (ref 0.1–1.0)
NEUTROS PCT: 48 %
Neutro Abs: 2.5 10*3/uL (ref 1.7–7.7)
Platelets: 208 10*3/uL (ref 150–400)
RBC: 4.93 MIL/uL (ref 4.22–5.81)
RDW: 12.7 % (ref 11.5–15.5)
WBC: 5.2 10*3/uL (ref 4.0–10.5)

## 2017-05-07 ENCOUNTER — Encounter (HOSPITAL_COMMUNITY): Payer: Self-pay | Admitting: Anesthesiology

## 2017-05-07 ENCOUNTER — Ambulatory Visit (HOSPITAL_COMMUNITY): Payer: Medicare Other | Admitting: Anesthesiology

## 2017-05-07 ENCOUNTER — Ambulatory Visit (HOSPITAL_COMMUNITY)
Admission: RE | Admit: 2017-05-07 | Discharge: 2017-05-07 | Disposition: A | Discharge status: Home / Self Care | Payer: Medicare Other | Source: Ambulatory Visit | Attending: Ophthalmology | Admitting: Ophthalmology

## 2017-05-07 ENCOUNTER — Encounter (HOSPITAL_COMMUNITY)
Admission: RE | Disposition: A | Discharge status: Home / Self Care | Payer: Self-pay | Source: Ambulatory Visit | Attending: Ophthalmology

## 2017-05-07 DIAGNOSIS — E039 Hypothyroidism, unspecified: Secondary | ICD-10-CM | POA: Insufficient documentation

## 2017-05-07 DIAGNOSIS — I4891 Unspecified atrial fibrillation: Secondary | ICD-10-CM | POA: Insufficient documentation

## 2017-05-07 DIAGNOSIS — E7800 Pure hypercholesterolemia, unspecified: Secondary | ICD-10-CM | POA: Insufficient documentation

## 2017-05-07 DIAGNOSIS — Z79899 Other long term (current) drug therapy: Secondary | ICD-10-CM | POA: Diagnosis not present

## 2017-05-07 DIAGNOSIS — G10 Huntington's disease: Secondary | ICD-10-CM | POA: Diagnosis not present

## 2017-05-07 DIAGNOSIS — F329 Major depressive disorder, single episode, unspecified: Secondary | ICD-10-CM | POA: Diagnosis not present

## 2017-05-07 DIAGNOSIS — Z7982 Long term (current) use of aspirin: Secondary | ICD-10-CM | POA: Diagnosis not present

## 2017-05-07 DIAGNOSIS — M1990 Unspecified osteoarthritis, unspecified site: Secondary | ICD-10-CM | POA: Insufficient documentation

## 2017-05-07 DIAGNOSIS — Z87891 Personal history of nicotine dependence: Secondary | ICD-10-CM | POA: Diagnosis not present

## 2017-05-07 DIAGNOSIS — K219 Gastro-esophageal reflux disease without esophagitis: Secondary | ICD-10-CM | POA: Diagnosis not present

## 2017-05-07 DIAGNOSIS — Z8711 Personal history of peptic ulcer disease: Secondary | ICD-10-CM | POA: Diagnosis not present

## 2017-05-07 DIAGNOSIS — H2512 Age-related nuclear cataract, left eye: Secondary | ICD-10-CM | POA: Insufficient documentation

## 2017-05-07 DIAGNOSIS — H4020X0 Unspecified primary angle-closure glaucoma, stage unspecified: Secondary | ICD-10-CM | POA: Diagnosis not present

## 2017-05-07 HISTORY — PX: CATARACT EXTRACTION W/PHACO: SHX586

## 2017-05-07 SURGERY — PHACOEMULSIFICATION, CATARACT, WITH IOL INSERTION
Anesthesia: Monitor Anesthesia Care | Site: Eye | Laterality: Left

## 2017-05-07 MED ORDER — CYCLOPENTOLATE-PHENYLEPHRINE 0.2-1 % OP SOLN
1.0000 [drp] | OPHTHALMIC | Status: AC
  Administered 2017-05-07 (×3): 1 [drp] via OPHTHALMIC

## 2017-05-07 MED ORDER — EPINEPHRINE PF 1 MG/ML IJ SOLN
INTRAOCULAR | Status: DC | PRN
  Administered 2017-05-07: 1 mL via OPHTHALMIC

## 2017-05-07 MED ORDER — LIDOCAINE HCL 3.5 % OP GEL
1.0000 "application " | Freq: Once | OPHTHALMIC | Status: AC
  Administered 2017-05-07: 1 via OPHTHALMIC

## 2017-05-07 MED ORDER — SODIUM HYALURONATE 23 MG/ML IO SOLN
INTRAOCULAR | Status: DC | PRN
  Administered 2017-05-07: 0.6 mL via INTRAOCULAR

## 2017-05-07 MED ORDER — PHENYLEPHRINE HCL 2.5 % OP SOLN
1.0000 [drp] | OPHTHALMIC | Status: AC
  Administered 2017-05-07 (×3): 1 [drp] via OPHTHALMIC

## 2017-05-07 MED ORDER — LACTATED RINGERS IV SOLN
INTRAVENOUS | Status: DC
  Administered 2017-05-07: 08:00:00 via INTRAVENOUS

## 2017-05-07 MED ORDER — PROVISC 10 MG/ML IO SOLN
INTRAOCULAR | Status: DC | PRN
  Administered 2017-05-07: 0.85 mL via INTRAOCULAR

## 2017-05-07 MED ORDER — NEOMYCIN-POLYMYXIN-DEXAMETH 3.5-10000-0.1 OP SUSP
OPHTHALMIC | Status: DC | PRN
  Administered 2017-05-07: 2 [drp] via OPHTHALMIC

## 2017-05-07 MED ORDER — ONDANSETRON 4 MG PO TBDP
4.0000 mg | ORAL_TABLET | Freq: Once | ORAL | Status: AC
  Administered 2017-05-07: 4 mg via ORAL
  Filled 2017-05-07: qty 1

## 2017-05-07 MED ORDER — BSS IO SOLN
INTRAOCULAR | Status: DC | PRN
  Administered 2017-05-07: 15 mL

## 2017-05-07 MED ORDER — MIDAZOLAM HCL 2 MG/2ML IJ SOLN
1.0000 mg | Freq: Once | INTRAMUSCULAR | Status: AC | PRN
  Administered 2017-05-07: 2 mg via INTRAVENOUS
  Filled 2017-05-07: qty 2

## 2017-05-07 MED ORDER — EPINEPHRINE PF 1 MG/ML IJ SOLN
INTRAMUSCULAR | Status: DC | PRN
  Administered 2017-05-07: 500 mL

## 2017-05-07 MED ORDER — POVIDONE-IODINE 5 % OP SOLN
OPHTHALMIC | Status: DC | PRN
  Administered 2017-05-07: 1 via OPHTHALMIC

## 2017-05-07 MED ORDER — TETRACAINE HCL 0.5 % OP SOLN
1.0000 [drp] | OPHTHALMIC | Status: AC
Start: 2017-05-07 — End: 2017-05-07
  Administered 2017-05-07 (×3): 1 [drp] via OPHTHALMIC

## 2017-05-07 SURGICAL SUPPLY — 15 items
CLOTH BEACON ORANGE TIMEOUT ST (SAFETY) IMPLANT
EYE SHIELD UNIVERSAL CLEAR (GAUZE/BANDAGES/DRESSINGS) ×1 IMPLANT
GLOVE BIOGEL PI IND STRL 6.5 (GLOVE) IMPLANT
GLOVE BIOGEL PI IND STRL 7.0 (GLOVE) IMPLANT
GLOVE BIOGEL PI INDICATOR 6.5 (GLOVE) ×1
GLOVE BIOGEL PI INDICATOR 7.0 (GLOVE) ×1
LENS ALC ACRYL/TECN (Ophthalmic Related) ×1 IMPLANT
NDL HYPO 18GX1.5 BLUNT FILL (NEEDLE) IMPLANT
NEEDLE HYPO 18GX1.5 BLUNT FILL (NEEDLE) ×2 IMPLANT
PAD ARMBOARD 7.5X6 YLW CONV (MISCELLANEOUS) ×1 IMPLANT
SYR TB 1ML LL NO SAFETY (SYRINGE) ×1 IMPLANT
TAPE SURG TRANSPORE 1 IN (GAUZE/BANDAGES/DRESSINGS) IMPLANT
TAPE SURGICAL TRANSPORE 1 IN (GAUZE/BANDAGES/DRESSINGS) ×1
VISCOELASTIC ADDITIONAL (OPHTHALMIC RELATED) ×1 IMPLANT
WATER STERILE IRR 250ML POUR (IV SOLUTION) ×1 IMPLANT

## 2017-05-07 NOTE — Anesthesia Postprocedure Evaluation (Signed)
Anesthesia Post Note  Patient: Sergio Baker  Procedure(s) Performed: Procedure(s) (LRB): CATARACT EXTRACTION PHACO AND INTRAOCULAR LENS PLACEMENT (IOC) (Left)  Patient location during evaluation: Short Stay Anesthesia Type: MAC Level of consciousness: awake and alert Pain management: satisfactory to patient Vital Signs Assessment: post-procedure vital signs reviewed and stable Respiratory status: spontaneous breathing Cardiovascular status: stable Postop Assessment: no apparent nausea or vomiting Anesthetic complications: no     Last Vitals:  Vitals:   05/07/17 0730 05/07/17 0745  BP: (!) 149/97 (!) 162/105  Resp: (!) 30 (!) 29  Temp:    SpO2: 97% 97%    Last Pain:  Vitals:   05/07/17 0717  TempSrc: Oral                 Cylas Falzone

## 2017-05-07 NOTE — Discharge Instructions (Signed)
Please discharge patient when stable, will follow up today with Dr. Marisa Hua at the [facility] office at 10:35AM.  Leave shield in place until visit.  All paperwork with discharge instructions will be given at the office.  Monitored Anesthesia Care, Care After These instructions provide you with information about caring for yourself after your procedure. Your health care provider may also give you more specific instructions. Your treatment has been planned according to current medical practices, but problems sometimes occur. Call your health care provider if you have any problems or questions after your procedure. What can I expect after the procedure? After your procedure, it is common to:  Feel sleepy for several hours.  Feel clumsy and have poor balance for several hours.  Feel forgetful about what happened after the procedure.  Have poor judgment for several hours.  Feel nauseous or vomit.  Have a sore throat if you had a breathing tube during the procedure.  Follow these instructions at home: For at least 24 hours after the procedure:   Do not: ? Participate in activities in which you could fall or become injured. ? Drive. ? Use heavy machinery. ? Drink alcohol. ? Take sleeping pills or medicines that cause drowsiness. ? Make important decisions or sign legal documents. ? Take care of children on your own.  Rest. Eating and drinking  Follow the diet that is recommended by your health care provider.  If you vomit, drink water, juice, or soup when you can drink without vomiting.  Make sure you have little or no nausea before eating solid foods. General instructions  Have a responsible adult stay with you until you are awake and alert.  Take over-the-counter and prescription medicines only as told by your health care provider.  If you smoke, do not smoke without supervision.  Keep all follow-up visits as told by your health care provider. This is  important. Contact a health care provider if:  You keep feeling nauseous or you keep vomiting.  You feel light-headed.  You develop a rash.  You have a fever. Get help right away if:  You have trouble breathing. This information is not intended to replace advice given to you by your health care provider. Make sure you discuss any questions you have with your health care provider. Document Released: 11/24/2015 Document Revised: 03/25/2016 Document Reviewed: 11/24/2015 Elsevier Interactive Patient Education  [employer].

## 2017-05-07 NOTE — Anesthesia Preprocedure Evaluation (Addendum)
Anesthesia Evaluation  Patient identified by MRN, date of birth, ID band Patient awake    Reviewed: Allergy & Precautions, NPO status , Patient's Chart, lab work & pertinent test results  Airway Mallampati: I  TM Distance: >3 FB Neck ROM: Full    Dental  (+) Teeth Intact   Pulmonary former smoker,    breath sounds clear to auscultation       Cardiovascular Exercise Tolerance: Poor + dysrhythmias Atrial Fibrillation  Rhythm:Regular Rate:Normal  Atrial fibrillation with slow ventricular response Septal infarct , age undetermined Abnormal ECG   Neuro/Psych PSYCHIATRIC DISORDERS Depression Decreased memoryHuntington's Chorea, stable, mild    GI/Hepatic hiatal hernia, PUD, GERD  Medicated and Controlled,stable   Endo/Other  Hypothyroidism   Renal/GU      Musculoskeletal  (+) Arthritis ,   Abdominal Normal abdominal exam  (+)   Peds  Hematology   Anesthesia Other Findings   Reproductive/Obstetrics                            Anesthesia Physical Anesthesia Plan  ASA: III  Anesthesia Plan: MAC   Post-op Pain Management:    Induction:   PONV Risk Score and Plan: Ondansetron and Midazolam  Airway Management Planned: Simple Face Mask  Additional Equipment:   Intra-op Plan:   Post-operative Plan:   Informed Consent: I have reviewed the patients History and Physical, chart, labs and discussed the procedure including the risks, benefits and alternatives for the proposed anesthesia with the patient or authorized representative who has indicated his/her understanding and acceptance.   Dental advisory given  Plan Discussed with: CRNA  Anesthesia Plan Comments:         Anesthesia Quick Evaluation

## 2017-05-07 NOTE — Op Note (Signed)
Date of procedure: 05/07/17  Pre-operative diagnosis: Visually significant cataract, Left Eye  Post-operative diagnosis: Visually significant cataract, Left Eye  Procedure: Removal of cataract via phacoemulsification and insertion of intra-ocular lens AMO PCB00  +26.5D into the capsular bag of the Left Eye  Attending surgeon: Gerda Diss. Kearstyn Avitia, MD, MA  Anesthesia: MAC, Topical [place]  Complications: None  Estimated Blood Loss: <72m (minimal)  Specimens: None  Implants: As above  Indications:  Visually significant cataract, Left Eye  Procedure:  The patient was seen and identified in the pre-operative area. The operative eye was identified and dilated.  The operative eye was marked.  Topical anesthesia was administered to the operative eye.     The patient was then to the operative suite and placed in the supine position.  A timeout was performed confirming the patient, procedure to be performed, and all other relevant information.   The patient's face was prepped and draped in the usual fashion for intra-ocular surgery.  A lid speculum was placed into the operative eye and the surgical microscope moved into place and focused.  A superotemporal paracentesis was created using a 20 gauge paracentesis blade.  Shugarcaine was injected into the anterior chamber.  Viscoelastic was injected into the anterior chamber.  A temporal clear-corneal main wound incision was created using a 2.467mmicrokeratome.  A continuous curvilinear capsulorrhexis was initiated using an irrigating cystitome and completed using capsulorrhexis forceps.  Hydrodissection and hydrodeliniation were performed.  Viscoelastic was injected into the anterior chamber.  A phacoemulsification handpiece and a chopper as a second instrument were used to remove the nucleus and epinucleus. The irrigation/aspiration handpiece was used to remove any remaining cortical material.   The capsular bag was reinflated with viscoelastic, checked,  and found to be intact.  The intraocular lens was inserted into the capsular bag and dialed into place using a Kuglen hook.  The irrigation/aspiration handpiece was used to remove any remaining viscoelastic.  The clear corneal wound and paracentesis wounds were then hydrated and checked with Weck-Cels to be watertight.  The lid-speculum and drape was removed, and the patient's face was cleaned with a wet and dry 4x4.  Maxitrol was instilled in the eye before a clear shield was taped over the eye. The patient was taken to the post-operative care unit in good condition, having tolerated the procedure well.  Post-Op Instructions: [facility] and will receive all other orders and instructions.

## 2017-05-07 NOTE — H&P (Signed)
The H and P was reviewed and updated. The patient was examined.  No changes were found after exam.  The surgical eye was marked.

## 2017-05-07 NOTE — Transfer of Care (Signed)
Immediate Anesthesia Transfer of Care Note  Patient: Sergio Baker  Procedure(s) Performed: Procedure(s) with comments: CATARACT EXTRACTION PHACO AND INTRAOCULAR LENS PLACEMENT (IOC) (Left) - CDE: 10.49  Patient Location: Short Stay  Anesthesia Type:MAC  Level of Consciousness: awake and patient cooperative  Airway & Oxygen Therapy: Patient Spontanous Breathing  Post-op Assessment: Report given to RN and Post -op Vital signs reviewed and stable  Post vital signs: Reviewed and stable  Last Vitals:  Vitals:   05/07/17 0730 05/07/17 0745  BP: (!) 149/97 (!) 162/105  Resp: (!) 30 (!) 29  Temp:    SpO2: 97% 97%    Last Pain:  Vitals:   05/07/17 0717  TempSrc: Oral      Patients Stated Pain Goal: 8 (45/85/92 9244)  Complications: No apparent anesthesia complications

## 2017-05-10 ENCOUNTER — Encounter (HOSPITAL_COMMUNITY): Payer: Self-pay | Admitting: Ophthalmology

## 2017-05-12 ENCOUNTER — Ambulatory Visit (INDEPENDENT_AMBULATORY_CARE_PROVIDER_SITE_OTHER): Payer: Medicare Other

## 2017-05-12 DIAGNOSIS — Z23 Encounter for immunization: Secondary | ICD-10-CM

## 2017-05-18 ENCOUNTER — Inpatient Hospital Stay (HOSPITAL_COMMUNITY): Admission: RE | Admit: 2017-05-18 | Payer: Medicare Other | Source: Ambulatory Visit

## 2017-05-25 ENCOUNTER — Ambulatory Visit: Payer: Medicare Other | Admitting: Internal Medicine

## 2017-05-27 DIAGNOSIS — H25811 Combined forms of age-related cataract, right eye: Secondary | ICD-10-CM | POA: Diagnosis not present

## 2017-05-31 ENCOUNTER — Encounter (HOSPITAL_COMMUNITY)
Admission: RE | Admit: 2017-05-31 | Discharge: 2017-05-31 | Disposition: A | Discharge status: Home / Self Care | Payer: Medicare Other | Source: Ambulatory Visit | Attending: Ophthalmology | Admitting: Ophthalmology

## 2017-05-31 ENCOUNTER — Encounter (HOSPITAL_COMMUNITY): Payer: Self-pay

## 2017-06-04 ENCOUNTER — Ambulatory Visit (HOSPITAL_COMMUNITY): Payer: Medicare Other | Admitting: Anesthesiology

## 2017-06-04 ENCOUNTER — Encounter (HOSPITAL_COMMUNITY)
Admission: RE | Disposition: A | Discharge status: Home / Self Care | Payer: Self-pay | Source: Ambulatory Visit | Attending: Ophthalmology

## 2017-06-04 ENCOUNTER — Ambulatory Visit (HOSPITAL_COMMUNITY)
Admission: RE | Admit: 2017-06-04 | Discharge: 2017-06-04 | Disposition: A | Discharge status: Home / Self Care | Payer: Medicare Other | Source: Ambulatory Visit | Attending: Ophthalmology | Admitting: Ophthalmology

## 2017-06-04 ENCOUNTER — Encounter (HOSPITAL_COMMUNITY): Payer: Self-pay

## 2017-06-04 DIAGNOSIS — G10 Huntington's disease: Secondary | ICD-10-CM | POA: Insufficient documentation

## 2017-06-04 DIAGNOSIS — K219 Gastro-esophageal reflux disease without esophagitis: Secondary | ICD-10-CM | POA: Diagnosis not present

## 2017-06-04 DIAGNOSIS — E7800 Pure hypercholesterolemia, unspecified: Secondary | ICD-10-CM | POA: Diagnosis not present

## 2017-06-04 DIAGNOSIS — Z79899 Other long term (current) drug therapy: Secondary | ICD-10-CM | POA: Insufficient documentation

## 2017-06-04 DIAGNOSIS — H2511 Age-related nuclear cataract, right eye: Secondary | ICD-10-CM | POA: Diagnosis not present

## 2017-06-04 DIAGNOSIS — H269 Unspecified cataract: Secondary | ICD-10-CM | POA: Diagnosis not present

## 2017-06-04 DIAGNOSIS — M1990 Unspecified osteoarthritis, unspecified site: Secondary | ICD-10-CM | POA: Diagnosis not present

## 2017-06-04 DIAGNOSIS — H25811 Combined forms of age-related cataract, right eye: Secondary | ICD-10-CM | POA: Diagnosis not present

## 2017-06-04 DIAGNOSIS — I4891 Unspecified atrial fibrillation: Secondary | ICD-10-CM | POA: Diagnosis not present

## 2017-06-04 DIAGNOSIS — Z7982 Long term (current) use of aspirin: Secondary | ICD-10-CM | POA: Insufficient documentation

## 2017-06-04 DIAGNOSIS — E039 Hypothyroidism, unspecified: Secondary | ICD-10-CM | POA: Diagnosis not present

## 2017-06-04 HISTORY — PX: CATARACT EXTRACTION W/PHACO: SHX586

## 2017-06-04 SURGERY — PHACOEMULSIFICATION, CATARACT, WITH IOL INSERTION
Anesthesia: Monitor Anesthesia Care | Site: Eye | Laterality: Right

## 2017-06-04 MED ORDER — TETRACAINE HCL 0.5 % OP SOLN
1.0000 [drp] | OPHTHALMIC | Status: AC
  Administered 2017-06-04 (×3): 1 [drp] via OPHTHALMIC

## 2017-06-04 MED ORDER — PHENYLEPHRINE HCL 2.5 % OP SOLN
1.0000 [drp] | OPHTHALMIC | Status: AC
  Administered 2017-06-04 (×3): 1 [drp] via OPHTHALMIC

## 2017-06-04 MED ORDER — LACTATED RINGERS IV SOLN
INTRAVENOUS | Status: DC
  Administered 2017-06-04: 12:00:00 via INTRAVENOUS

## 2017-06-04 MED ORDER — CYCLOPENTOLATE-PHENYLEPHRINE 0.2-1 % OP SOLN
1.0000 [drp] | OPHTHALMIC | Status: AC
  Administered 2017-06-04 (×3): 1 [drp] via OPHTHALMIC

## 2017-06-04 MED ORDER — EPINEPHRINE PF 1 MG/ML IJ SOLN
INTRAMUSCULAR | Status: AC
  Filled 2017-06-04: qty 2

## 2017-06-04 MED ORDER — LIDOCAINE HCL (PF) 1 % IJ SOLN
INTRAOCULAR | Status: DC | PRN
  Administered 2017-06-04: 1 mL via OPHTHALMIC

## 2017-06-04 MED ORDER — BSS IO SOLN
INTRAOCULAR | Status: DC | PRN
  Administered 2017-06-04: 15 mL via INTRAOCULAR

## 2017-06-04 MED ORDER — SODIUM HYALURONATE 23 MG/ML IO SOLN
INTRAOCULAR | Status: DC | PRN
  Administered 2017-06-04: 0.6 mL via INTRAOCULAR

## 2017-06-04 MED ORDER — NEOMYCIN-POLYMYXIN-DEXAMETH 3.5-10000-0.1 OP SUSP
OPHTHALMIC | Status: DC | PRN
  Administered 2017-06-04: 2 [drp] via OPHTHALMIC

## 2017-06-04 MED ORDER — FENTANYL CITRATE (PF) 100 MCG/2ML IJ SOLN
25.0000 ug | Freq: Once | INTRAMUSCULAR | Status: AC
  Administered 2017-06-04: 25 ug via INTRAVENOUS
  Filled 2017-06-04: qty 2

## 2017-06-04 MED ORDER — PROVISC 10 MG/ML IO SOLN
INTRAOCULAR | Status: DC | PRN
  Administered 2017-06-04: 0.85 mL via INTRAOCULAR

## 2017-06-04 MED ORDER — MIDAZOLAM HCL 2 MG/2ML IJ SOLN
1.0000 mg | INTRAMUSCULAR | Status: AC
  Administered 2017-06-04: 2 mg via INTRAVENOUS
  Filled 2017-06-04: qty 2

## 2017-06-04 MED ORDER — BSS IO SOLN
INTRAOCULAR | Status: DC | PRN
  Administered 2017-06-04: 500 mL

## 2017-06-04 MED ORDER — LIDOCAINE HCL 3.5 % OP GEL
1.0000 "application " | Freq: Once | OPHTHALMIC | Status: AC
  Administered 2017-06-04: 1 via OPHTHALMIC

## 2017-06-04 MED ORDER — POVIDONE-IODINE 5 % OP SOLN
OPHTHALMIC | Status: DC | PRN
  Administered 2017-06-04: 1 via OPHTHALMIC

## 2017-06-04 SURGICAL SUPPLY — 14 items
CLOTH BEACON ORANGE TIMEOUT ST (SAFETY) ×1 IMPLANT
EYE SHIELD UNIVERSAL CLEAR (GAUZE/BANDAGES/DRESSINGS) ×1 IMPLANT
GLOVE BIOGEL PI IND STRL 7.0 (GLOVE) IMPLANT
GLOVE BIOGEL PI INDICATOR 7.0 (GLOVE) ×2
LENS ALC ACRYL/TECN (Ophthalmic Related) ×1 IMPLANT
NDL HYPO 18GX1.5 BLUNT FILL (NEEDLE) IMPLANT
NEEDLE HYPO 18GX1.5 BLUNT FILL (NEEDLE) ×2 IMPLANT
PAD ARMBOARD 7.5X6 YLW CONV (MISCELLANEOUS) ×1 IMPLANT
RING MALYGIN (MISCELLANEOUS) IMPLANT
SYR TB 1ML LL NO SAFETY (SYRINGE) ×1 IMPLANT
TAPE SURG TRANSPORE 1 IN (GAUZE/BANDAGES/DRESSINGS) IMPLANT
TAPE SURGICAL TRANSPORE 1 IN (GAUZE/BANDAGES/DRESSINGS) ×1
VISCOELASTIC ADDITIONAL (OPHTHALMIC RELATED) ×1 IMPLANT
WATER STERILE IRR 250ML POUR (IV SOLUTION) ×1 IMPLANT

## 2017-06-04 NOTE — Anesthesia Preprocedure Evaluation (Signed)
Anesthesia Evaluation  Patient identified by MRN, date of birth, ID band Patient awake    Reviewed: Allergy & Precautions, NPO status , Patient's Chart, lab work & pertinent test results  Airway Mallampati: I  TM Distance: >3 FB Neck ROM: Full    Dental  (+) Teeth Intact   Pulmonary former smoker,    breath sounds clear to auscultation       Cardiovascular Exercise Tolerance: Poor + dysrhythmias Atrial Fibrillation  Rhythm:Regular Rate:Normal  Atrial fibrillation with slow ventricular response Septal infarct , age undetermined Abnormal ECG   Neuro/Psych PSYCHIATRIC DISORDERS Depression Decreased memoryHuntington's Chorea, stable, mild    GI/Hepatic hiatal hernia, PUD, GERD  Medicated and Controlled,stable   Endo/Other  Hypothyroidism   Renal/GU      Musculoskeletal  (+) Arthritis ,   Abdominal Normal abdominal exam  (+)   Peds  Hematology   Anesthesia Other Findings   Reproductive/Obstetrics                             Anesthesia Physical Anesthesia Plan  ASA: III  Anesthesia Plan: MAC   Post-op Pain Management:    Induction: Intravenous  PONV Risk Score and Plan:   Airway Management Planned: Nasal Cannula  Additional Equipment:   Intra-op Plan:   Post-operative Plan:   Informed Consent: I have reviewed the patients History and Physical, chart, labs and discussed the procedure including the risks, benefits and alternatives for the proposed anesthesia with the patient or authorized representative who has indicated his/her understanding and acceptance.   Dental advisory given  Plan Discussed with: CRNA  Anesthesia Plan Comments:         Anesthesia Quick Evaluation

## 2017-06-04 NOTE — H&P (Signed)
The H and P was reviewed and updated. The patient was examined.  No changes were found after exam.  The surgical eye was marked.

## 2017-06-04 NOTE — Op Note (Signed)
Date of procedure: 06/04/17  Pre-operative diagnosis: Visually significant cataract, Right Eye  Post-operative diagnosis: Visually significant cataract, Right Eye  Procedure: Removal of cataract via phacoemulsification and insertion of intra-ocular lens AMO PCB00  +26.0D into the capsular bag of the Right Eye  Attending surgeon: Gerda Diss. Tremeka Helbling, MD, MA  Anesthesia: MAC, Topical [place]  Complications: None  Estimated Blood Loss: <78m (minimal)  Specimens: None  Implants: As above  Indications:  Visually significant cataract, Right Eye  Procedure:  The patient was seen and identified in the pre-operative area. The operative eye was identified and dilated.  The operative eye was marked.  Topical anesthesia was administered to the operative eye.     The patient was then to the operative suite and placed in the supine position.  A timeout was performed confirming the patient, procedure to be performed, and all other relevant information.   The patient's face was prepped and draped in the usual fashion for intra-ocular surgery.  A lid speculum was placed into the operative eye and the surgical microscope moved into place and focused.  A superotemporal paracentesis was created using a 20 gauge paracentesis blade.  Shugarcaine was injected into the anterior chamber.  Viscoelastic was injected into the anterior chamber.  A temporal clear-corneal main wound incision was created using a 2.439mmicrokeratome.  A continuous curvilinear capsulorrhexis was initiated using an irrigating cystitome and completed using capsulorrhexis forceps.  Hydrodissection and hydrodeliniation were performed.  Viscoelastic was injected into the anterior chamber.  A phacoemulsification handpiece and a chopper as a second instrument were used to remove the nucleus and epinucleus. The irrigation/aspiration handpiece was used to remove any remaining cortical material.   The capsular bag was reinflated with viscoelastic,  checked, and found to be intact.  The intraocular lens was inserted into the capsular bag and dialed into place using a Kuglen hook.  The irrigation/aspiration handpiece was used to remove any remaining viscoelastic.  The clear corneal wound and paracentesis wounds were then hydrated and checked with Weck-Cels to be watertight.  The lid-speculum and drape was removed, and the patient's face was cleaned with a wet and dry 4x4.  Maxitrol was instilled in the eye before a clear shield was taped over the eye. The patient was taken to the post-operative care unit in good condition, having tolerated the procedure well.  Post-Op Instructions: [facility] and will receive all other orders and instructions.

## 2017-06-04 NOTE — Anesthesia Postprocedure Evaluation (Signed)
Anesthesia Post Note  Patient: Sergio Baker  Procedure(s) Performed: CATARACT EXTRACTION PHACO AND INTRAOCULAR LENS PLACEMENT RIGHT EYE (Right Eye)  Patient location during evaluation: Short Stay Anesthesia Type: MAC Level of consciousness: awake Pain management: pain level controlled Vital Signs Assessment: post-procedure vital signs reviewed and stable Respiratory status: spontaneous breathing Cardiovascular status: blood pressure returned to baseline Postop Assessment: no apparent nausea or vomiting Anesthetic complications: no     Last Vitals:  Vitals:   06/04/17 1200 06/04/17 1205  BP: (!) 169/91 (!) 150/97  Pulse:    Resp: 14   Temp:    SpO2: 91% 94%    Last Pain:  Vitals:   06/04/17 1151  TempSrc: Oral                 [name] J

## 2017-06-04 NOTE — Discharge Instructions (Signed)
Please discharge patient when stable, will follow up today with Dr. Marisa Hua at the [facility] office immediately following discharge.  Leave shield in place until visit.  All paperwork with discharge instructions will be given at the office.   Monitored Anesthesia Care, Care After These instructions provide you with information about caring for yourself after your procedure. Your health care provider may also give you more specific instructions. Your treatment has been planned according to current medical practices, but problems sometimes occur. Call your health care provider if you have any problems or questions after your procedure. What can I expect after the procedure? After your procedure, it is common to:  Feel sleepy for several hours.  Feel clumsy and have poor balance for several hours.  Feel forgetful about what happened after the procedure.  Have poor judgment for several hours.  Feel nauseous or vomit.  Have a sore throat if you had a breathing tube during the procedure.  Follow these instructions at home: For at least 24 hours after the procedure:   Do not: ? Participate in activities in which you could fall or become injured. ? Drive. ? Use heavy machinery. ? Drink alcohol. ? Take sleeping pills or medicines that cause drowsiness. ? Make important decisions or sign legal documents. ? Take care of children on your own.  Rest. Eating and drinking  Follow the diet that is recommended by your health care provider.  If you vomit, drink water, juice, or soup when you can drink without vomiting.  Make sure you have little or no nausea before eating solid foods. General instructions  Have a responsible adult stay with you until you are awake and alert.  Take over-the-counter and prescription medicines only as told by your health care provider.  If you smoke, do not smoke without supervision.  Keep all follow-up visits as told by your health care  provider. This is important. Contact a health care provider if:  You keep feeling nauseous or you keep vomiting.  You feel light-headed.  You develop a rash.  You have a fever. Get help right away if:  You have trouble breathing. This information is not intended to replace advice given to you by your health care provider. Make sure you discuss any questions you have with your health care provider. Document Released: 11/24/2015 Document Revised: 03/25/2016 Document Reviewed: 11/24/2015 Elsevier Interactive Patient Education  [employer].

## 2017-06-04 NOTE — Transfer of Care (Signed)
Immediate Anesthesia Transfer of Care Note  Patient: Sergio Baker  Procedure(s) Performed: CATARACT EXTRACTION PHACO AND INTRAOCULAR LENS PLACEMENT RIGHT EYE (Right Eye)  Patient Location: Short Stay  Anesthesia Type:MAC  Level of Consciousness: awake  Airway & Oxygen Therapy: Patient Spontanous Breathing  Post-op Assessment: Report given to RN and Post -op Vital signs reviewed and stable  Post vital signs: Reviewed and stable  Last Vitals:  Vitals:   06/04/17 1200 06/04/17 1205  BP: (!) 169/91 (!) 150/97  Pulse:    Resp: 14   Temp:    SpO2: 91% 94%    Last Pain:  Vitals:   06/04/17 1151  TempSrc: Oral         Complications: No apparent anesthesia complications

## 2017-06-07 ENCOUNTER — Ambulatory Visit (INDEPENDENT_AMBULATORY_CARE_PROVIDER_SITE_OTHER): Payer: Medicare Other | Admitting: Nurse Practitioner

## 2017-06-07 ENCOUNTER — Encounter (HOSPITAL_COMMUNITY): Payer: Self-pay | Admitting: Ophthalmology

## 2017-06-07 VITALS — BP 133/79 | HR 66 | Temp 96.8°F | Ht 69.0 in | Wt 162.0 lb

## 2017-06-07 DIAGNOSIS — I482 Chronic atrial fibrillation, unspecified: Secondary | ICD-10-CM

## 2017-06-07 DIAGNOSIS — F3342 Major depressive disorder, recurrent, in full remission: Secondary | ICD-10-CM | POA: Diagnosis not present

## 2017-06-07 DIAGNOSIS — E785 Hyperlipidemia, unspecified: Secondary | ICD-10-CM | POA: Diagnosis not present

## 2017-06-07 DIAGNOSIS — E034 Atrophy of thyroid (acquired): Secondary | ICD-10-CM

## 2017-06-07 DIAGNOSIS — R269 Unspecified abnormalities of gait and mobility: Secondary | ICD-10-CM

## 2017-06-07 DIAGNOSIS — Z6827 Body mass index (BMI) 27.0-27.9, adult: Secondary | ICD-10-CM | POA: Diagnosis not present

## 2017-06-07 DIAGNOSIS — K219 Gastro-esophageal reflux disease without esophagitis: Secondary | ICD-10-CM

## 2017-06-07 DIAGNOSIS — R1319 Other dysphagia: Secondary | ICD-10-CM | POA: Diagnosis not present

## 2017-06-07 DIAGNOSIS — R413 Other amnesia: Secondary | ICD-10-CM

## 2017-06-07 DIAGNOSIS — G10 Huntington's disease: Secondary | ICD-10-CM

## 2017-06-07 MED ORDER — OMEPRAZOLE 20 MG PO CPDR: DELAYED_RELEASE_CAPSULE | ORAL | 1 refills | Status: DC

## 2017-06-07 MED ORDER — CITALOPRAM HYDROBROMIDE 20 MG PO TABS: 20.0000 mg | ORAL_TABLET | Freq: Every day | ORAL | 1 refills | Status: DC

## 2017-06-07 MED ORDER — LEVOTHYROXINE SODIUM 50 MCG PO TABS: ORAL_TABLET | ORAL | 1 refills | Status: DC

## 2017-06-07 MED ORDER — HYDROCODONE-ACETAMINOPHEN 5-325 MG PO TABS: 1.0000 | ORAL_TABLET | Freq: Every day | ORAL | 0 refills | Status: DC | PRN

## 2017-06-07 MED ORDER — HALOPERIDOL 1 MG PO TABS: 1.5000 mg | ORAL_TABLET | Freq: Two times a day (BID) | ORAL | 1 refills | Status: DC

## 2017-06-07 NOTE — Progress Notes (Signed)
Subjective:    Patient ID: Sergio Baker, male    DOB: 1935/07/17, 82 y.o.   MRN: [id number]  HPI  Sergio Baker is here today for follow up of chronic medical problem.  Outpatient Encounter Prescriptions as of 06/07/2017  Medication Sig  . acetaminophen (TYLENOL) 650 MG CR tablet Take 1,300 mg by mouth daily as needed for pain.  . Aloe Vera 25 MG CAPS Take 25 mg by mouth daily.  Marland Kitchen aspirin EC 81 MG tablet Take 81 mg by mouth daily.  Marland Kitchen CALCIUM CITRATE PO Take 1 tablet by mouth daily.  . Cholecalciferol (VITAMIN D3) 2000 UNITS capsule Take 2,000 Units by mouth daily.  . citalopram (CELEXA) 20 MG tablet TAKE ONE (1) TABLET EACH DAY (Patient taking differently: Take 20 mg by mouth daily. )  . Glucosamine-Chondroit-Vit C-Mn (GLUCOSAMINE CHONDR 1500 COMPLX PO) Take 1 tablet by mouth daily.  . haloperidol (HALDOL) 1 MG tablet Take 1.5 tablets (1.5 mg total) by mouth 2 (two) times daily. (Patient taking differently: Take 1 mg by mouth daily. )  . HYDROcodone-acetaminophen (NORCO/VICODIN) 5-325 MG tablet Take 1 tablet by mouth daily as needed for moderate pain.  Marland Kitchen levothyroxine (SYNTHROID, LEVOTHROID) 50 MCG tablet TAKE ONE (1) TABLET EACH DAY (Patient taking differently: Take 50 mcg by mouth daily before breakfast. )  . Methylsulfonylmethane (MSM) 1000 MG CAPS Take 1,000 mg by mouth daily.  . Multiple Vitamin (MULTIVITAMIN WITH MINERALS) TABS Take 1 tablet by mouth daily. For Men 50+  . Omega-3 Fatty Acids (FISH OIL PO) Take 1 tablet by mouth daily.  Marland Kitchen omeprazole (PRILOSEC) 20 MG capsule TAKE ONE (1) CAPSULE EACH DAY (Patient taking differently: Take 20 mg by mouth daily. )  . Prednisolon-Gatiflox-Bromfenac 1-0.5-0.075 % SUSP Place 1 drop into the left eye 2 (two) times daily.   No facility-administered encounter medications on file as of 06/07/2017.     1. Chronic atrial fibrillation (HCC)  denies nay palpitaions or SOB, takes a daily aspirin  2. Gastroesophageal reflux disease,  esophagitis presence not specified Omeprazole daily works well  3. Other dysphagia  Still has trouble swallowing some things  4. Hypothyroidism due to acquired atrophy of thyroid  No problems that he is aware of  5. Huntington's chorea ([place])  Saw neurologist on 03/23/17. No changes were made at that time and he is to follow up in February unless he develops any problems. He is currently on haldol which his wife says is working well. He has developed some back pain and he takes an occasional lortab 5/325. Only takes 2-3x a week.  6. Memory deficits  Wife feels he os maintaining right now- he is on no medicatiion  7. Hyperlipidemia with target LDL less than 100  Wife does not fix him any fried foods  8. Recurrent major depressive disorder, in full remission ([place])  currrently takes celexa which is working well or him.. No side effects from medication.  9. BMI 27.0-27.9,adult  No recent weight changes  10. Abnormality of gait  From huntington's chorea. He denies any falls.    New complaints: None today  Social history: Had cataract surgery last week- doing well- wife is hie sole caregiver. His daughter also has Huntington's chorea and she is not doing well.    Review of Systems  Constitutional: Negative.   HENT: Negative.   Respiratory: Negative for cough and shortness of breath.   Cardiovascular: Negative for chest pain, palpitations and leg swelling.  Gastrointestinal: Negative.   Genitourinary:  Negative.   Musculoskeletal: Positive for gait problem.  Neurological: Positive for tremors.  Psychiatric/Behavioral: Negative.   All other systems reviewed and are negative.      Objective:   Physical Exam  Constitutional: He is oriented to person, place, and time. He appears well-developed and well-nourished.  HENT:  Head: Normocephalic.  Right Ear: External ear normal.  Left Ear: External ear normal.  Nose: Nose normal.  Mouth/Throat: Oropharynx is clear and moist.  Eyes:  Pupils are equal, round, and reactive to light. EOM are normal.  Neck: Normal range of motion. Neck supple. No JVD present. No thyromegaly present.  Cardiovascular: Normal rate, regular rhythm, normal heart sounds and intact distal pulses.  Exam reveals no gallop and no friction rub.   No murmur heard. Pulmonary/Chest: Effort normal and breath sounds normal. No respiratory distress. He has no wheezes. He has no rales. He exhibits no tenderness.  Abdominal: Soft. Bowel sounds are normal. He exhibits no mass. There is no tenderness.  Genitourinary: Prostate normal and penis normal.  Musculoskeletal: Normal range of motion. He exhibits no edema.  Lymphadenopathy:    He has no cervical adenopathy.  Neurological: He is alert and oriented to person, place, and time. No cranial nerve deficit.  Involuntary movements of bil arms, legs and head at times  Skin: Skin is warm and dry.  Psychiatric: He has a normal mood and affect. His behavior is normal. Judgment and thought content normal.   BP 133/79   Pulse 66   Temp (!) 96.8 F (36 C) (Oral)   Ht 5' 9"  (1.753 m)   Wt 162 lb (73.5 kg)   BMI 23.92 kg/m        Assessment & Plan:  1. Chronic atrial fibrillation (HCC) Continue aspirin daily  2. Gastroesophageal reflux disease, esophagitis presence not specified Avoid spicy foods Do not eat 2 hours prior to bedtime - omeprazole (PRILOSEC) 20 MG capsule; TAKE ONE (1) CAPSULE EACH DAY  Dispense: 90 capsule; Refill: 1  3. Other dysphagia Avoid food st hat have toruble swallowing  4. Hypothyroidism due to acquired atrophy of thyroid - levothyroxine (SYNTHROID, LEVOTHROID) 50 MCG tablet; TAKE ONE (1) TABLET EACH DAY  Dispense: 90 tablet; Refill: 1 - Thyroid Panel With TSH  5. Huntington's chorea ([place]) Keep follow up with neurologist - haloperidol (HALDOL) 1 MG tablet; Take 1.5 tablets (1.5 mg total) by mouth 2 (two) times daily.  Dispense: 270 tablet; Refill: 1 - HYDROcodone-acetaminophen  (NORCO/VICODIN) 5-325 MG tablet; Take 1 tablet by mouth daily as needed for moderate pain.  Dispense: 30 tablet; Refill: 0  6. Memory deficits Brain games will help  7. Hyperlipidemia with target LDL less than 100 Low fat diet - CMP14+EGFR - Lipid panel  8. Recurrent major depressive disorder, in full remission ([place]) Stress management - citalopram (CELEXA) 20 MG tablet; Take 1 tablet (20 mg total) by mouth daily.  Dispense: 90 tablet; Refill: 1  9. BMI 27.0-27.9,adult  10. Abnormality of gait Fall precautions    Labs pending Health maintenance reviewed Diet and exercise encouraged Continue all meds Follow up  In 3 months   [place], FNP

## 2017-06-07 NOTE — Patient Instructions (Signed)

## 2017-06-08 ENCOUNTER — Encounter: Payer: Self-pay | Admitting: Internal Medicine

## 2017-06-08 ENCOUNTER — Telehealth: Payer: Self-pay

## 2017-06-08 ENCOUNTER — Ambulatory Visit: Payer: Medicare Other | Admitting: *Deleted

## 2017-06-08 ENCOUNTER — Other Ambulatory Visit: Payer: Self-pay

## 2017-06-08 ENCOUNTER — Ambulatory Visit (INDEPENDENT_AMBULATORY_CARE_PROVIDER_SITE_OTHER): Payer: Medicare Other | Admitting: Internal Medicine

## 2017-06-08 VITALS — BP 128/71 | HR 71 | Temp 97.0°F | Ht 66.0 in | Wt 160.4 lb

## 2017-06-08 DIAGNOSIS — K51919 Ulcerative colitis, unspecified with unspecified complications: Secondary | ICD-10-CM

## 2017-06-08 DIAGNOSIS — K219 Gastro-esophageal reflux disease without esophagitis: Secondary | ICD-10-CM | POA: Diagnosis not present

## 2017-06-08 DIAGNOSIS — K5100 Ulcerative (chronic) pancolitis without complications: Secondary | ICD-10-CM | POA: Diagnosis not present

## 2017-06-08 LAB — CMP14+EGFR
ALBUMIN: 4.6 g/dL (ref 3.5–4.7)
ALT: 15 IU/L (ref 0–44)
AST: 22 IU/L (ref 0–40)
Albumin/Globulin Ratio: 1.9 (ref 1.2–2.2)
Alkaline Phosphatase: 63 IU/L (ref 39–117)
BUN / CREAT RATIO: 15 (ref 10–24)
BUN: 14 mg/dL (ref 8–27)
Bilirubin Total: 0.8 mg/dL (ref 0.0–1.2)
CALCIUM: 9.7 mg/dL (ref 8.6–10.2)
CO2: 26 mmol/L (ref 20–29)
CREATININE: 0.92 mg/dL (ref 0.76–1.27)
Chloride: 101 mmol/L (ref 96–106)
GFR, EST AFRICAN AMERICAN: 90 mL/min/{1.73_m2} (ref >59)
GFR, EST NON AFRICAN AMERICAN: 78 mL/min/{1.73_m2} (ref >59)
GLUCOSE: 107 mg/dL — AB (ref 65–99)
Globulin, Total: 2.4 g/dL (ref 1.5–4.5)
Potassium: 4.9 mmol/L (ref 3.5–5.2)
Sodium: 146 mmol/L — ABNORMAL HIGH (ref 134–144)
TOTAL PROTEIN: 7 g/dL (ref 6.0–8.5)

## 2017-06-08 LAB — THYROID PANEL WITH TSH
Free Thyroxine Index: 1.7 (ref 1.2–4.9)
T3 Uptake Ratio: 29 % (ref 24–39)
T4, Total: 5.8 ug/dL (ref 4.5–12.0)
TSH: 2.66 u[IU]/mL (ref 0.450–4.500)

## 2017-06-08 LAB — LIPID PANEL
Chol/HDL Ratio: 5.3 ratio — ABNORMAL HIGH (ref 0.0–5.0)
Cholesterol, Total: 189 mg/dL (ref 100–199)
HDL: 36 mg/dL — AB (ref >39)
LDL CALC: 132 mg/dL — AB (ref 0–99)
Triglycerides: 103 mg/dL (ref 0–149)
VLDL CHOLESTEROL CAL: 21 mg/dL (ref 5–40)

## 2017-06-08 NOTE — Patient Instructions (Addendum)
Schedule a surveillance colonoscopy (longstanding Ulcerative Colitis)  Propofol sedation / split prep  Further recommendations to follow

## 2017-06-08 NOTE — Telephone Encounter (Signed)
Called and informed pt's wife of pre-op appt 07/13/17 at 11:00am. Letter mailed.

## 2017-06-08 NOTE — Progress Notes (Signed)
Primary Care Physician:  Sergio Pretty, FNP Primary Gastroenterologist:  Dr. Gala Baker  Pre-Procedure History & Physical: HPI:  Sergio Baker is a 82 y.o. male here for for follow-up GERD and 30+ year history of left-sided proctocolitis. [name] states he has done very well now taking only aloe vera for his UC. Stop taking mesalamine due to cost. Has about 3 bowel movements daily. Has passed a scant amount of red blood on occasion earlier this year. He denies constipation, diarrhea, abdominal pain. Reflux symptoms well controlled on omeprazole 20 mg daily. No dysphagia. We have talked about 1 more surveillance colonoscopy at some point in the near future.  Past Medical History:  Diagnosis Date  . A-fib ([place])   . Abnormality of gait   . Arthritis   . Depression   . Diverticula of colon   . Dysrhythmia    AFib  . Enlarged prostate   . GERD (gastroesophageal reflux disease)   . Hearing loss   . Hemorrhoids   . Hiatal hernia    small  . HOH (hard of hearing)   . Huntington's disease ([place]) 2010  . Hyperlipidemia   . Hypothyroidism   . Joint pain   . Memory deficits 01/17/2014  . 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  . Schatzki's ring   . Ulcerative colitis ([place])    left side    Past Surgical History:  Procedure Laterality Date  . APPENDECTOMY    . CATARACT EXTRACTION W/PHACO Left 05/07/2017   Procedure: CATARACT EXTRACTION PHACO AND INTRAOCULAR LENS PLACEMENT (IOC);  Surgeon: Sergio Goldmann, MD;  Location: [facility];  Service: Ophthalmology;  Laterality: Left;  CDE: 10.49  . CATARACT EXTRACTION W/PHACO Right 06/04/2017   Procedure: CATARACT EXTRACTION PHACO AND INTRAOCULAR LENS PLACEMENT RIGHT EYE;  Surgeon: Sergio Goldmann, MD;  Location: [facility];  Service: Ophthalmology;  Laterality: Right;  CDE: 7.07  . COLONOSCOPY  02/2010   patchy erythema, minimal granularity in distal rectum, left-sided diverticula, ileal diverticulum.  Bx benign. next TCS  02/2012  . COLONOSCOPY  03/24/2012   Dr. Gala Baker- inflammatory changes of the rectum and colon consistent with history of U/C. bx= benign colonic mucosa  . COLONOSCOPY N/A 10/05/2014   RMR: abnormal rectal and sigmoid mucosa as described above status post segmental biopsy. active colitis sigmoid colon.  . cyst removed from urinary bladder    . ESOPHAGOGASTRODUODENOSCOPY  04/2008   schatzki ring, s/p ED, hh  . LUMBAR FUSION    . SHOULDER ARTHROSCOPY W/ ROTATOR CUFF REPAIR Bilateral   . TONSILLECTOMY      Prior to Admission medications   Medication Sig Start Date End Date Taking? Authorizing Provider  acetaminophen (TYLENOL) 650 MG CR tablet Take 1,300 mg by mouth daily as needed for pain.   Yes [provider]  Aloe Vera 25 MG CAPS Take 25 mg by mouth daily.   Yes [provider]  aspirin EC 81 MG tablet Take 81 mg by mouth daily.   Yes [provider]  CALCIUM CITRATE PO Take 1 tablet by mouth daily.   Yes [provider]  Cholecalciferol (VITAMIN D3) 2000 UNITS capsule Take 2,000 Units by mouth daily.   Yes [provider]  citalopram (CELEXA) 20 MG tablet Take 1 tablet (20 mg total) by mouth daily. 06/07/17  Yes Martin, Mary-Margaret, FNP  Glucosamine-Chondroit-Vit C-Mn (GLUCOSAMINE CHONDR 1500 COMPLX PO) Take 1 tablet by mouth daily.   Yes [provider]  haloperidol (HALDOL)  1 MG tablet Take 1.5 tablets (1.5 mg total) by mouth 2 (two) times daily. 06/07/17  Yes Martin, Mary-Margaret, FNP  HYDROcodone-acetaminophen (NORCO/VICODIN) 5-325 MG tablet Take 1 tablet by mouth daily as needed for moderate pain. 06/07/17  Yes Sergio Done, Mary-Margaret, FNP  levothyroxine (SYNTHROID, LEVOTHROID) 50 MCG tablet TAKE ONE (1) TABLET EACH DAY 06/07/17  Yes Martin, Mary-Margaret, FNP  Methylsulfonylmethane (MSM) 1000 MG CAPS Take 1,000 mg by mouth daily.   Yes [provider]  Multiple Vitamin (MULTIVITAMIN WITH MINERALS) TABS Take 1 tablet by mouth  daily. For Men 50+   Yes [provider]  Omega-3 Fatty Acids (FISH OIL PO) Take 1 tablet by mouth daily.   Yes [provider]  omeprazole (PRILOSEC) 20 MG capsule TAKE ONE (1) CAPSULE EACH DAY 06/07/17  Yes Sergio Done, Mary-Margaret, FNP  Prednisolon-Gatiflox-Bromfenac 1-0.5-0.075 % SUSP Place 1 drop into the left eye 2 (two) times daily.   Yes [provider]    Allergies as of 06/08/2017  . (No Known Allergies)    Family History  Problem Relation Age of Onset  . Lung cancer Mother 38       deceased  . Other Brother        Posttraumatic stress disorder  . Cancer Brother        skin cancer  . Cancer Sister        thyroid  . Arthritis Sister   . SIDS Brother   . Huntington's disease Daughter   . Colon cancer Neg Hx     Social History   Social History  . Marital status: Married    Spouse name: Sergio Baker  . Number of children: 1  . Years of education: 12th   Occupational History  . retired Furniture conservator/restorer   . works as Administrator, arts at [employer]    Social History Main Topics  . Smoking status: Former Smoker    Packs/day: 0.25    Years: 8.00    Types: Cigarettes    Quit date: 05/03/1973  . Smokeless tobacco: Never Used     Comment: Quit 1970's   . Alcohol use No  . Drug use: No  . Sexual activity: No   Other Topics Concern  . Not on file   Social History Narrative   Patient lives at home with spouse.   Caffeine Use: 1 cup weekly   Right-handed    Review of Systems: See HPI, otherwise negative ROS  Physical Exam: BP 128/71   Pulse 71   Temp (!) 97 F (36.1 C) (Oral)   Ht 5' 6"  (1.676 m)   Wt 160 lb 6.4 oz (72.8 kg)   BMI 25.89 kg/m  General:   Alert,  , pleasant and cooperative in NAD.  In-voluntary movements noted. Skin:  Intact without significant lesions or rashes. Neck:  Supple; no masses or thyromegaly. No significant cervical adenopathy. Lungs:  Clear throughout to auscultation.   No wheezes, crackles, or rhonchi. No acute  distress. Heart:  Regular rate and rhythm; no murmurs, clicks, rubs,  or gallops. Abdomen: Non-distended, normal bowel sounds.  Soft and nontender without appreciable mass or hepatosplenomegaly.  Pulses:  Normal pulses noted. Extremities:  Without clubbing or edema.  Impression:  Very pleasant 82 year old gentleman with Huntington's disease with long-standing left-sided proctocolitis. He seems to be in more or less a remission on a homeopathic regimen. No history of dysplasia. We discussed the utility of 1 more surveillance colonoscopy. Discussed the risks versus the benefits. We mutually agreed it is worth  pursuing one more surveillance colonoscopy at his age.  GERD symptoms well controlled on omeprazole 20 mg daily  Recommendations:  I have offered the patient a surveillance colonoscopy.  The risks, benefits, limitations, alternatives and imponderables have been reviewed with the patient. Questions have been answered. All parties are agreeable.   We will schedule a surveillance colonoscopy (longstanding Ulcerative Colitis).    Propofol sedation / split prep  Further recommendations to follow        Notice: This dictation was prepared with Dragon dictation along with smaller phrase technology. Any transcriptional errors that result from this process are unintentional and may not be corrected upon review.

## 2017-06-10 ENCOUNTER — Encounter: Payer: Self-pay | Admitting: *Deleted

## 2017-06-10 ENCOUNTER — Ambulatory Visit (INDEPENDENT_AMBULATORY_CARE_PROVIDER_SITE_OTHER): Payer: Medicare Other | Admitting: *Deleted

## 2017-06-10 VITALS — BP 150/80 | HR 72 | Ht 66.0 in | Wt 162.0 lb

## 2017-06-10 DIAGNOSIS — Z0000 Encounter for general adult medical examination without abnormal findings: Secondary | ICD-10-CM | POA: Diagnosis not present

## 2017-06-10 NOTE — Patient Instructions (Signed)
  [name] , Thank you for taking time to come for your Medicare Wellness Visit. I appreciate your ongoing commitment to your health goals. Please review the following plan we discussed and let me know if I can assist you in the future.   These are the goals we discussed: Goals    . Exercise 150 minutes per week (moderate activity)       This is a list of the screening recommended for you and due dates:  Health Maintenance  Topic Date Due  . Tetanus Vaccine  01/26/2018  . Flu Shot  Completed  . Pneumonia vaccines  Completed

## 2017-06-11 NOTE — Progress Notes (Signed)
Subjective:   Sergio Baker is a 82 y.o. male who presents for a subsequent Medicare Annual Wellness Visit. 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 He lives at home with his wife who accompanies him today.   Review of Systems  Health is about the same as last year.   Cardiac Risk Factors include: advanced age (>56mn, >>74women);sedentary lifestyle;male gender  Other systems negative today.   Objective:    Today's Vitals   06/10/17 1519  BP: (!) 150/80  Pulse: 72  Weight: 162 lb (73.5 kg)  Height: 5' 6"  (1.676 m)   Body mass index is 26.15 kg/m.  Current Medications (verified) Outpatient Encounter Prescriptions as of 06/10/2017  Medication Sig  . acetaminophen (TYLENOL) 650 MG CR tablet Take 1,300 mg by mouth daily as needed for pain.  . Aloe Vera 25 MG CAPS Take 25 mg by mouth daily.  .Marland Kitchenaspirin EC 81 MG tablet Take 81 mg by mouth daily.  .Marland KitchenCALCIUM CITRATE PO Take 1 tablet by mouth daily.  . Cholecalciferol (VITAMIN D3) 2000 UNITS capsule Take 2,000 Units by mouth daily.  . citalopram (CELEXA) 20 MG tablet Take 1 tablet (20 mg total) by mouth daily.  . Glucosamine-Chondroit-Vit C-Mn (GLUCOSAMINE CHONDR 1500 COMPLX PO) Take 1 tablet by mouth daily.  . haloperidol (HALDOL) 1 MG tablet Take 1.5 tablets (1.5 mg total) by mouth 2 (two) times daily.  .Marland KitchenHYDROcodone-acetaminophen (NORCO/VICODIN) 5-325 MG tablet Take 1 tablet by mouth daily as needed for moderate pain.  .Marland Kitchenlevothyroxine (SYNTHROID, LEVOTHROID) 50 MCG tablet TAKE ONE (1) TABLET EACH DAY  . Methylsulfonylmethane (MSM) 1000 MG CAPS Take 1,000 mg by mouth daily.  . Multiple Vitamin (MULTIVITAMIN WITH MINERALS) TABS Take 1 tablet by mouth daily. For Men 50+  . Omega-3 Fatty Acids (FISH OIL PO) Take 1 tablet by mouth daily.  .Marland Kitchenomeprazole (PRILOSEC) 20 MG capsule TAKE ONE (1) CAPSULE EACH DAY  . Prednisolon-Gatiflox-Bromfenac 1-0.5-0.075 % SUSP Place 1 drop into the left eye 2 (two) times daily.   No facility-administered  encounter medications on file as of 06/10/2017.     Allergies (verified) Patient has no known allergies.   History: Past Medical History:  Diagnosis Date  . [place]-fib ([place]   . Abnormality of gait   . Arthritis   . Depression   . Diverticula of colon   . Dysrhythmia    AFib  . Enlarged prostate   . GERD (gastroesophageal reflux disease)   . Hearing loss   . Hemorrhoids   . Hiatal hernia    small  . HOH (hard of hearing)   . [place]'s disease ([place]  . Hyperlipidemia   . Hypothyroidism   . Joint pain   . Memory deficits 01/17/2014  . Pro[place]ocolitis, ulcerative ([place]    Diagnosed in the 1980s.  . Schatzki's ring   . Ulcerative colitis ([place]    left side   Past Surgical History:  Procedure Laterality Date  . APPENDECTOMY    . CATARACT EXTRACTION W/PHACO Left 05/07/2017   Procedure: CATARACT EXTRACTION PHACO AND INTRAOCULAR LENS PLACEMENT (IOC);  Surgeon: WBaruch Goldmann MD;  Location: [facility];  Service: Ophthalmology;  Laterality: Left;  CDE: 10.49  . CATARACT EXTRACTION W/PHACO Right 06/04/2017   Procedure: CATARACT EXTRACTION PHACO AND INTRAOCULAR LENS PLACEMENT RIGHT EYE;  Surgeon: WBaruch Goldmann MD;  Location: [facility];  Service: Ophthalmology;  Laterality: Right;  CDE: 7.07  . COLONOSCOPY  02/2010   patchy erythema, minimal granularity in distal rectum, left-sided  diverticula, ileal diverticulum.  Bx benign. next TCS 02/2012  . COLONOSCOPY  03/24/2012   Dr. Gala Romney- inflammatory changes of the rectum and colon consistent with history of U/C. bx= benign colonic mucosa  . COLONOSCOPY N/A 10/05/2014   RMR: abnormal rectal and sigmoid mucosa as described above status post segmental biopsy. active colitis sigmoid colon.  . cyst removed from urinary bladder    . ESOPHAGOGASTRODUODENOSCOPY  04/2008   schatzki ring, s/p ED, hh  . LUMBAR FUSION    . SHOULDER ARTHROSCOPY W/ ROTATOR CUFF REPAIR Bilateral   . TONSILLECTOMY     Family History  Problem Relation Age of Onset  . Lung  cancer Mother 59       deceased  . Other Brother        Posttraumatic stress disorder  . Cancer Brother        skin cancer  . Cancer Sister        thyroid  . Arthritis Sister   . SIDS Brother   . Huntington's disease Daughter   . Colon cancer Neg Hx    Social History   Occupational History  . retired Furniture conservator/restorer   . works as Administrator, [employer] at [employer]    Social History Main Topics  . Smoking status: Former Smoker    Packs/day: 0.25    Years: 8.00    Types: Cigarettes    Quit date: 05/03/1973  . Smokeless tobacco: Never Used     Comment: Quit 1970's   . Alcohol use No  . Drug use: No  . Sexual activity: No   Tobacco Counseling No tobacco use  Activities of Daily Living In your present state of health, do you have any difficulty performing the following activities: 06/10/2017 05/03/2017  Hearing? Y Y  Comment Hearing aids bilateral hearing aids  Vision? Y Y  Comment had cataracts removed recently. Still having some blurred vision. Has f/u scheduled -  Difficulty concentrating or making decisions? N N  Walking or climbing stairs? Y Y  Dressing or bathing? N N  Doing errands, shopping? N N  Preparing Food and eating ? N -  Using the Toilet? N -  In the past six months, have you accidently leaked urine? N -  Do you have problems with loss of bowel control? N -  Managing your Medications? N -  Managing your Finances? N -  Housekeeping or managing your Housekeeping? Y -  Some recent data might be hidden    Immunizations and Health Maintenance Immunization History  Administered Date(s) Administered  . Influenza, High Dose Seasonal PF 05/12/2017  . Influenza,inj,Quad PF,6+ Mos 05/14/2014, 05/16/2015, 05/07/2016  . Pneumococcal Conjugate-13 10/25/2014  . Pneumococcal Polysaccharide-23 05/18/2007   There are no preventive care reminders to display for this patient.  Patient Care Team: Chevis Pretty, FNP as PCP - General (Nurse Practitioner) Daneil Dolin, MD  (Gastroenterology)  No hospitalizations, ER visits or surgeries this past year.     Assessment:   This is a routine wellness examination for Abdoulaye.   Hearing/Vision screen No deficits noted. Eye exam is up to date  Dietary issues and exercise activities discussed: Current Exercise Habits: The patient does not participate in regular exercise at present, Exercise limited by: neurologic condition(s) (Huntington's Disease)  Diet: 3 meals a day. They eat out frequently.   Goals    . Exercise 150 minutes per week (moderate activity)      Depression Screen [facility] 2/9 Scores 06/10/2017 06/07/2017 03/02/2017 12/14/2016  PHQ - 2  Score 0 0 0 0  PHQ- 9 Score - - - -    Fall Risk Fall Risk  06/10/2017 06/07/2017 03/02/2017 12/14/2016 10/13/2016  Falls in the past year? Yes Yes Yes - Yes  Number falls in past yr: 2 or more 1 2 or more 2 or more 1  Injury with Fall? No Yes No Yes No  Comment - bump on his head - - -  Risk for fall due to : Impaired balance/gait - - - -  Follow up Falls prevention discussed - - - -    Cognitive Function: MMSE - Mini Mental State Exam 06/10/2017 03/23/2017 10/09/2016 10/05/2016 09/16/2016  Orientation to time 5 5 5 3 5   Orientation to Place 5 5 5 5 5   Registration 3 3 3 2 3   Attention/ Calculation 5 5 5 5 5   Recall 2 3 3 1 3   Language- name 2 objects 2 2 2 2 2   Language- repeat 1 1 1 1 1   Language- follow 3 step command 3 3 3 3 3   Language- read & follow direction 1 1 1 1 1   Write a sentence 1 1 1 1 1   Copy design 1 1 1 1 1   Total score 29 30 30 25 30         Screening Tests Health Maintenance  Topic Date Due  . TETANUS/TDAP  01/26/2018  . INFLUENZA VACCINE  Completed  . PNA vac Low Risk Adult  Completed        Plan:  Keep f/u with PCP Increase physical activity as tolerated.  Move carefully to avoid falls.   I have personally reviewed and noted the following in the patient's chart:   . Medical and social history . Use of alcohol, tobacco or  illicit drugs  . Current medications and supplements . Functional ability and status . Nutritional status . Physical activity . Advanced directives . List of other physicians . Hospitalizations, surgeries, and ER visits in previous 12 months . Vitals . Screenings to include cognitive, depression, and falls . Referrals and appointments  In addition, I have reviewed and discussed with patient certain preventive protocols, quality metrics, and best practice recommendations. A written personalized care plan for preventive services as well as general preventive health recommendations were provided to patient.     Chong Sicilian, RN   06/11/2017   I have reviewed and agree with the above AWV documentation.   Mary-Margaret Hassell Done, FNP

## 2017-06-15 DIAGNOSIS — M47816 Spondylosis without myelopathy or radiculopathy, lumbar region: Secondary | ICD-10-CM | POA: Diagnosis not present

## 2017-06-20 IMAGING — DX DG HAND COMPLETE 3+V*L*
3 series · 3 of 3 positions shown · non-contrast
Comparison: None.

CLINICAL DATA: 81-year-old who fell two days ago when he slipped on
a piece of pizza at a restaurant. Injuries to the left hand and
shoulder. Initial encounter.

EXAM:
LEFT HAND - COMPLETE 3+ VIEW

[hand pa]
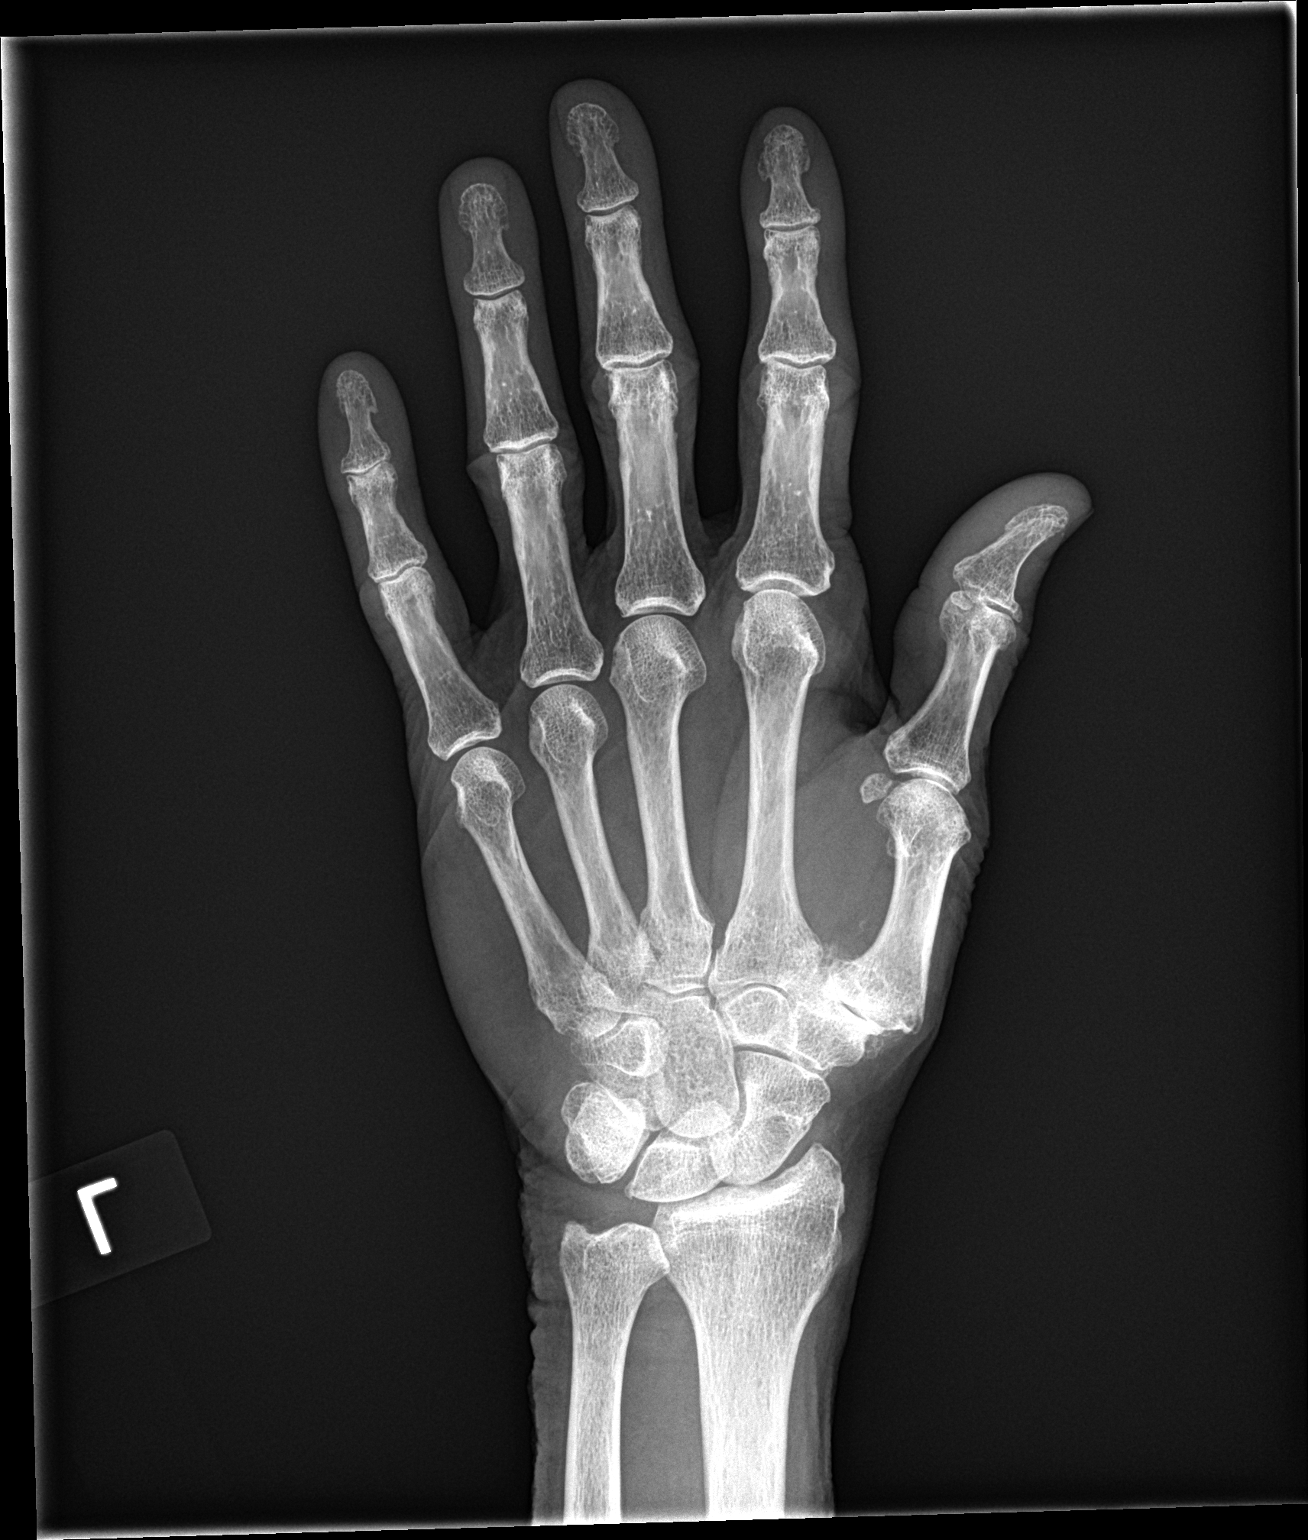

[hand obl]
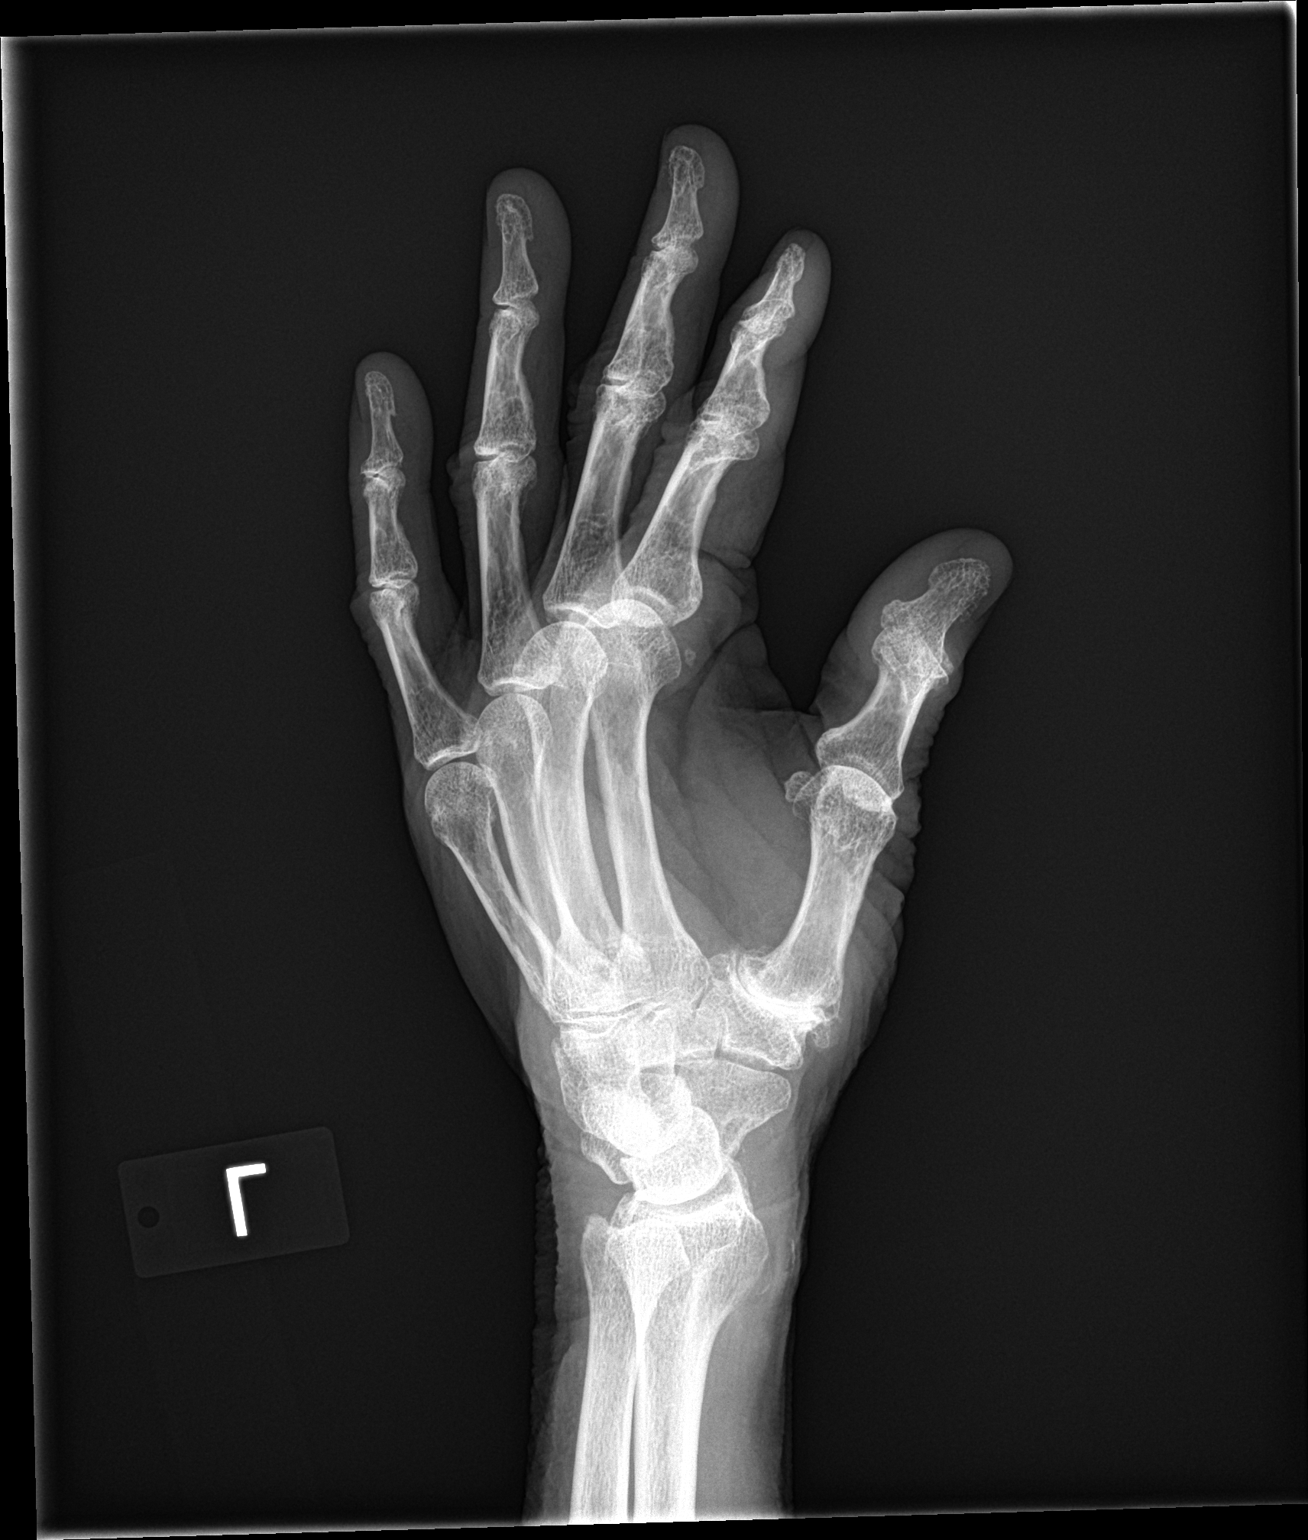

[hand lat]
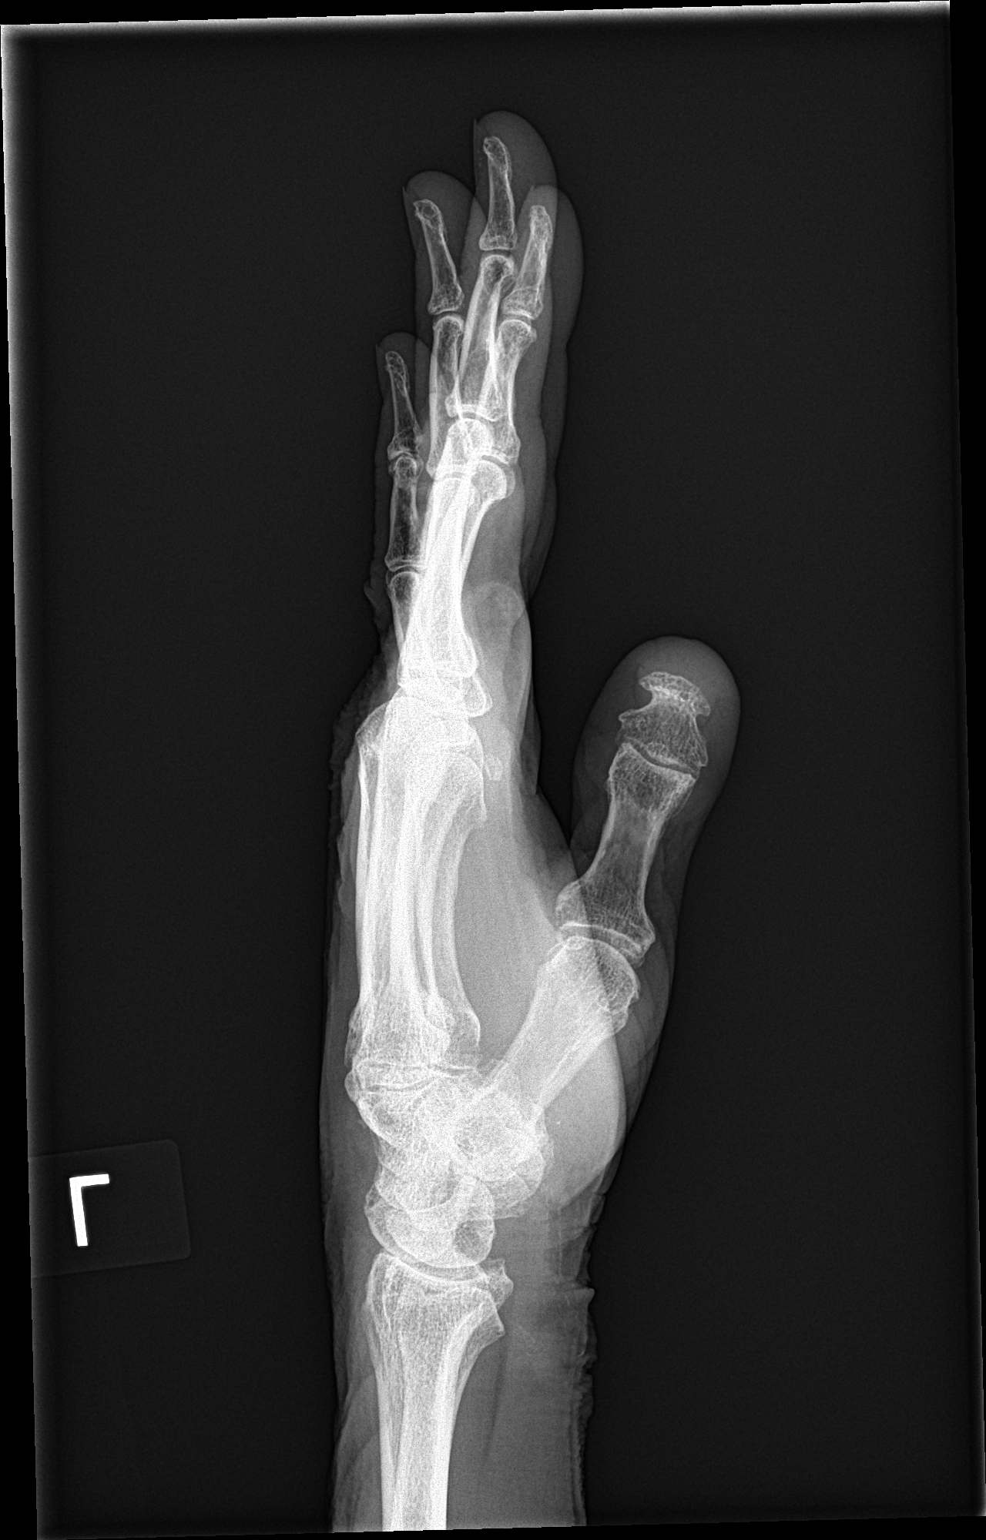

[3 of 3 positions shown; findings below may reference images not displayed]

FINDINGS: No evidence of acute fracture or dislocation. Narrowing of the IP
joint space of the thumb and multiple IP joint spaces of the
fingers. MCP joint spaces well-preserved. Severe narrowing of the
trapezium-first metacarpal joint space with associated hypertrophic
spurring.
IMPRESSION: 1. No acute osseous abnormality.
2. Severe osteoarthritis involving the trapezium-first metacarpal
joint. Mild osteoarthritis involving multiple IP joints of the hand.

## 2017-06-22 ENCOUNTER — Encounter: Payer: Self-pay | Admitting: *Deleted

## 2017-06-23 DIAGNOSIS — H2512 Age-related nuclear cataract, left eye: Secondary | ICD-10-CM | POA: Diagnosis not present

## 2017-06-23 DIAGNOSIS — Z961 Presence of intraocular lens: Secondary | ICD-10-CM | POA: Diagnosis not present

## 2017-06-23 DIAGNOSIS — H2511 Age-related nuclear cataract, right eye: Secondary | ICD-10-CM | POA: Diagnosis not present

## 2017-07-07 NOTE — Patient Instructions (Signed)
Sergio Baker  07/07/2017     @PREFPERIOPPHARMACY @   Your procedure is scheduled on 07/19/2017.  Report to [facility] at 6:15 A.M.  Call this number if you have problems the morning of surgery:  [phone]   Remember:  Do not eat food or drink liquids after midnight.  Take these medicines the morning of surgery with A SIP OF WATER Celexa, Haldol, Hydrocodone. Synthroid, Prilosec   Do not wear jewelry, make-up or nail polish.  Do not wear lotions, powders, or perfumes, or deoderant.  Do not shave 48 hours prior to surgery.  Men may shave face and neck.  Do not bring valuables to the hospital.  [facility] is not responsible for any belongings or valuables.  Contacts, dentures or bridgework may not be worn into surgery.  Leave your suitcase in the car.  After surgery it may be brought to your room.  For patients admitted to the hospital, discharge time will be determined by your treatment team.  Patients discharged the day of surgery will not be allowed to drive home.    Please read over the following fact sheets that you were given. Anesthesia Post-op Instructions     PATIENT INSTRUCTIONS POST-ANESTHESIA  IMMEDIATELY FOLLOWING SURGERY:  Do not drive or operate machinery for the first twenty four hours after surgery.  Do not make any important decisions for twenty four hours after surgery or while taking narcotic pain medications or sedatives.  If you develop intractable nausea and vomiting or a severe headache please notify your doctor immediately.  FOLLOW-UP:  Please make an appointment with your surgeon as instructed. You do not need to follow up with anesthesia unless specifically instructed to do so.  WOUND CARE INSTRUCTIONS (if applicable):  Keep a dry clean dressing on the anesthesia/puncture wound site if there is drainage.  Once the wound has quit draining you may leave it open to air.  Generally you should leave the bandage intact for twenty four hours unless  there is drainage.  If the epidural site drains for more than 36-48 hours please call the anesthesia department.  QUESTIONS?:  Please feel free to call your physician or the hospital operator if you have any questions, and they will be happy to assist you.      Colonoscopy, Adult A colonoscopy is an exam to look at the entire large intestine. During the exam, a lubricated, bendable tube is inserted into the anus and then passed into the rectum, colon, and other parts of the large intestine. A colonoscopy is often done as a part of normal colorectal screening or in response to certain symptoms, such as anemia, persistent diarrhea, abdominal pain, and blood in the stool. The exam can help screen for and diagnose medical problems, including:  Tumors.  Polyps.  Inflammation.  Areas of bleeding.  Tell a health care provider about:  Any allergies you have.  All medicines you are taking, including vitamins, herbs, eye drops, creams, and over-the-counter medicines.  Any problems you or family members have had with anesthetic medicines.  Any blood disorders you have.  Any surgeries you have had.  Any medical conditions you have.  Any problems you have had passing stool. What are the risks? Generally, this is a safe procedure. However, problems may occur, including:  Bleeding.  A tear in the intestine.  A reaction to medicines given during the exam.  Infection (rare).  What happens before the procedure? Eating and drinking restrictions Follow instructions from your health  care provider about eating and drinking, which may include:  A few days before the procedure - follow a low-fiber diet. Avoid nuts, seeds, dried fruit, raw fruits, and vegetables.  1-3 days before the procedure - follow a clear liquid diet. Drink only clear liquids, such as clear broth or bouillon, black coffee or tea, clear juice, clear soft drinks or sports drinks, gelatin dessert, and popsicles. Avoid any  liquids that contain red or purple dye.  On the day of the procedure - do not eat or drink anything during the 2 hours before the procedure, or within the time period that your health care provider recommends.  Bowel prep If you were prescribed an oral bowel prep to clean out your colon:  Take it as told by your health care provider. Starting the day before your procedure, you will need to drink a large amount of medicated liquid. The liquid will cause you to have multiple loose stools until your stool is almost clear or light green.  If your skin or anus gets irritated from diarrhea, you may use these to relieve the irritation: ? Medicated wipes, such as adult wet wipes with aloe and vitamin E. ? A skin soothing-product like petroleum jelly.  If you vomit while drinking the bowel prep, take a break for up to 60 minutes and then begin the bowel prep again. If vomiting continues and you cannot take the bowel prep without vomiting, call your health care provider.  General instructions  Ask your health care provider about changing or stopping your regular medicines. This is especially important if you are taking diabetes medicines or blood thinners.  Plan to have someone take you home from the hospital or clinic. What happens during the procedure?  An IV tube may be inserted into one of your veins.  You will be given medicine to help you relax (sedative).  To reduce your risk of infection: ? Your health care team will wash or sanitize their hands. ? Your anal area will be washed with soap.  You will be asked to lie on your side with your knees bent.  Your health care provider will lubricate a long, thin, flexible tube. The tube will have a camera and a light on the end.  The tube will be inserted into your anus.  The tube will be gently eased through your rectum and colon.  Air will be delivered into your colon to keep it open. You may feel some pressure or cramping.  The camera  will be used to take images during the procedure.  A small tissue sample may be removed from your body to be examined under a microscope (biopsy). If any potential problems are found, the tissue will be sent to a lab for testing.  If small polyps are found, your health care provider may remove them and have them checked for cancer cells.  The tube that was inserted into your anus will be slowly removed. The procedure may vary among health care providers and hospitals. What happens after the procedure?  Your blood pressure, heart rate, breathing rate, and blood oxygen level will be monitored until the medicines you were given have worn off.  Do not drive for 24 hours after the exam.  You may have a small amount of blood in your stool.  You may pass gas and have mild abdominal cramping or bloating due to the air that was used to inflate your colon during the exam.  It is up to you to  get the results of your procedure. Ask your health care provider, or the department performing the procedure, when your results will be ready. This information is not intended to replace advice given to you by your health care provider. Make sure you discuss any questions you have with your health care provider. Document Released: 07/31/2000 Document Revised: 06/03/2016 Document Reviewed: 10/15/2015 Elsevier Interactive Patient Education  2018 [employer].

## 2017-07-13 ENCOUNTER — Telehealth: Payer: Self-pay | Admitting: Internal Medicine

## 2017-07-13 ENCOUNTER — Encounter (HOSPITAL_COMMUNITY)
Admission: RE | Admit: 2017-07-13 | Discharge: 2017-07-13 | Disposition: A | Discharge status: Home / Self Care | Payer: Medicare Other | Source: Ambulatory Visit | Attending: Internal Medicine | Admitting: Internal Medicine

## 2017-07-13 NOTE — Telephone Encounter (Addendum)
Called spoke with Sergio Baker and she did receive a message from spouse to cancel preop for today. Preop is r/s'd for 07/14/17 at 1:15pm. Spoke with spouse and made aware of new preop appt details. If this does not work she will call back to cancel. Nothing further needed at this time.

## 2017-07-13 NOTE — Telephone Encounter (Signed)
Pt's wife called to say a family emergency has come up and patient will have to reschedule his pre op for today. I gave her Carolyn's number to call and to reschedule. His procedure is scheduled for Monday. She said if anything changes where he can't keep procedure date that she would call back.

## 2017-07-13 NOTE — Patient Instructions (Signed)
Sergio Baker  07/13/2017     @PREFPERIOPPHARMACY @   Your procedure is scheduled on  07/19/2017   Report to [facility] at  65  A.M.  Call this number if you have problems the morning of surgery:  [phone]   Remember:  Do not eat food or drink liquids after midnight.  Take these medicines the morning of surgery with A SIP OF WATER  Celexa, haldol, hydrocodone, levothyroxine, prilosec.   Do not wear jewelry, make-up or nail polish.  Do not wear lotions, powders, or perfumes, or deoderant.  Do not shave 48 hours prior to surgery.  Men may shave face and neck.  Do not bring valuables to the hospital.  [facility] is not responsible for any belongings or valuables.  Contacts, dentures or bridgework may not be worn into surgery.  Leave your suitcase in the car.  After surgery it may be brought to your room.  For patients admitted to the hospital, discharge time will be determined by your treatment team.  Patients discharged the day of surgery will not be allowed to drive home.   Name and phone number of your driver:   family Special instructions:  Follow the diet and prep instructions given to you by Dr Roseanne Kaufman office.  Please read over the following fact sheets that you were given. Anesthesia Post-op Instructions and Care and Recovery After Surgery       Colonoscopy, Adult A colonoscopy is an exam to look at the entire large intestine. During the exam, a lubricated, bendable tube is inserted into the anus and then passed into the rectum, colon, and other parts of the large intestine. A colonoscopy is often done as a part of normal colorectal screening or in response to certain symptoms, such as anemia, persistent diarrhea, abdominal pain, and blood in the stool. The exam can help screen for and diagnose medical problems, including:  Tumors.  Polyps.  Inflammation.  Areas of bleeding.  Tell a health care provider about:  Any allergies you  have.  All medicines you are taking, including vitamins, herbs, eye drops, creams, and over-the-counter medicines.  Any problems you or family members have had with anesthetic medicines.  Any blood disorders you have.  Any surgeries you have had.  Any medical conditions you have.  Any problems you have had passing stool. What are the risks? Generally, this is a safe procedure. However, problems may occur, including:  Bleeding.  A tear in the intestine.  A reaction to medicines given during the exam.  Infection (rare).  What happens before the procedure? Eating and drinking restrictions Follow instructions from your health care provider about eating and drinking, which may include:  A few days before the procedure - follow a low-fiber diet. Avoid nuts, seeds, dried fruit, raw fruits, and vegetables.  1-3 days before the procedure - follow a clear liquid diet. Drink only clear liquids, such as clear broth or bouillon, black coffee or tea, clear juice, clear soft drinks or sports drinks, gelatin dessert, and popsicles. Avoid any liquids that contain red or purple dye.  On the day of the procedure - do not eat or drink anything during the 2 hours before the procedure, or within the time period that your health care provider recommends.  Bowel prep If you were prescribed an oral bowel prep to clean out your colon:  Take it as told by your health care provider. Starting the day before  your procedure, you will need to drink a large amount of medicated liquid. The liquid will cause you to have multiple loose stools until your stool is almost clear or light green.  If your skin or anus gets irritated from diarrhea, you may use these to relieve the irritation: ? Medicated wipes, such as adult wet wipes with aloe and vitamin E. ? A skin soothing-product like petroleum jelly.  If you vomit while drinking the bowel prep, take a break for up to 60 minutes and then begin the bowel prep  again. If vomiting continues and you cannot take the bowel prep without vomiting, call your health care provider.  General instructions  Ask your health care provider about changing or stopping your regular medicines. This is especially important if you are taking diabetes medicines or blood thinners.  Plan to have someone take you home from the hospital or clinic. What happens during the procedure?  An IV tube may be inserted into one of your veins.  You will be given medicine to help you relax (sedative).  To reduce your risk of infection: ? Your health care team will wash or sanitize their hands. ? Your anal area will be washed with soap.  You will be asked to lie on your side with your knees bent.  Your health care provider will lubricate a long, thin, flexible tube. The tube will have a camera and a light on the end.  The tube will be inserted into your anus.  The tube will be gently eased through your rectum and colon.  Air will be delivered into your colon to keep it open. You may feel some pressure or cramping.  The camera will be used to take images during the procedure.  A small tissue sample may be removed from your body to be examined under a microscope (biopsy). If any potential problems are found, the tissue will be sent to a lab for testing.  If small polyps are found, your health care provider may remove them and have them checked for cancer cells.  The tube that was inserted into your anus will be slowly removed. The procedure may vary among health care providers and hospitals. What happens after the procedure?  Your blood pressure, heart rate, breathing rate, and blood oxygen level will be monitored until the medicines you were given have worn off.  Do not drive for 24 hours after the exam.  You may have a small amount of blood in your stool.  You may pass gas and have mild abdominal cramping or bloating due to the air that was used to inflate your colon  during the exam.  It is up to you to get the results of your procedure. Ask your health care provider, or the department performing the procedure, when your results will be ready. This information is not intended to replace advice given to you by your health care provider. Make sure you discuss any questions you have with your health care provider. Document Released: 07/31/2000 Document Revised: 06/03/2016 Document Reviewed: 10/15/2015 Elsevier Interactive Patient Education  2018 [employer].  Colonoscopy, Adult, Care After This sheet gives you information about how to care for yourself after your procedure. Your health care provider may also give you more specific instructions. If you have problems or questions, contact your health care provider. What can I expect after the procedure? After the procedure, it is common to have:  A small amount of blood in your stool for 24 hours after the  procedure.  Some gas.  Mild abdominal cramping or bloating.  Follow these instructions at home: General instructions   For the first 24 hours after the procedure: ? Do not drive or use machinery. ? Do not sign important documents. ? Do not drink alcohol. ? Do your regular daily activities at a slower pace than normal. ? Eat soft, easy-to-digest foods. ? Rest often.  Take over-the-counter or prescription medicines only as told by your health care provider.  It is up to you to get the results of your procedure. Ask your health care provider, or the department performing the procedure, when your results will be ready. Relieving cramping and bloating  Try walking around when you have cramps or feel bloated.  Apply heat to your abdomen as told by your health care provider. Use a heat source that your health care provider recommends, such as a moist heat pack or a heating pad. ? Place a towel between your skin and the heat source. ? Leave the heat on for 20-30 minutes. ? Remove the heat if your  skin turns bright red. This is especially important if you are unable to feel pain, heat, or cold. You may have a greater risk of getting burned. Eating and drinking  Drink enough fluid to keep your urine clear or pale yellow.  Resume your normal diet as instructed by your health care provider. Avoid heavy or fried foods that are hard to digest.  Avoid drinking alcohol for as long as instructed by your health care provider. Contact a health care provider if:  You have blood in your stool 2-3 days after the procedure. Get help right away if:  You have more than a small spotting of blood in your stool.  You pass large blood clots in your stool.  Your abdomen is swollen.  You have nausea or vomiting.  You have a fever.  You have increasing abdominal pain that is not relieved with medicine. This information is not intended to replace advice given to you by your health care provider. Make sure you discuss any questions you have with your health care provider. Document Released: 03/17/2004 Document Revised: 04/27/2016 Document Reviewed: 10/15/2015 Elsevier Interactive Patient Education  2018 [place], Care After These instructions provide you with information about caring for yourself after your procedure. Your health care provider may also give you more specific instructions. Your treatment has been planned according to current medical practices, but problems sometimes occur. Call your health care provider if you have any problems or questions after your procedure. What can I expect after the procedure? After your procedure, it is common to:  Feel sleepy for several hours.  Feel clumsy and have poor balance for several hours.  Feel forgetful about what happened after the procedure.  Have poor judgment for several hours.  Feel nauseous or vomit.  Have a sore throat if you had a breathing tube during the procedure.  Follow these instructions at  home: For at least 24 hours after the procedure:   Do not: ? Participate in activities in which you could fall or become injured. ? Drive. ? Use heavy machinery. ? Drink alcohol. ? Take sleeping pills or medicines that cause drowsiness. ? Make important decisions or sign legal documents. ? Take care of children on your own.  Rest. Eating and drinking  Follow the diet that is recommended by your health care provider.  If you vomit, drink water, juice, or soup when you can drink  without vomiting.  Make sure you have little or no nausea before eating solid foods. General instructions  Have a responsible adult stay with you until you are awake and alert.  Take over-the-counter and prescription medicines only as told by your health care provider.  If you smoke, do not smoke without supervision.  Keep all follow-up visits as told by your health care provider. This is important. Contact a health care provider if:  You keep feeling nauseous or you keep vomiting.  You feel light-headed.  You develop a rash.  You have a fever. Get help right away if:  You have trouble breathing. This information is not intended to replace advice given to you by your health care provider. Make sure you discuss any questions you have with your health care provider. Document Released: 11/24/2015 Document Revised: 03/25/2016 Document Reviewed: 11/24/2015 Elsevier Interactive Patient Education  2018 [place] Anesthesia is a term that refers to techniques, procedures, and medicines that help a person stay safe and comfortable during a medical procedure. Monitored anesthesia care, or sedation, is one type of anesthesia. Your anesthesia specialist may recommend sedation if you will be having a procedure that does not require you to be unconscious, such as:  Cataract surgery.  A dental procedure.  A biopsy.  A colonoscopy.  During the procedure, you may receive a  medicine to help you relax (sedative). There are three levels of sedation:  Mild sedation. At this level, you may feel awake and relaxed. You will be able to follow directions.  Moderate sedation. At this level, you will be sleepy. You may not remember the procedure.  Deep sedation. At this level, you will be asleep. You will not remember the procedure.  The more medicine you are given, the deeper your level of sedation will be. Depending on how you respond to the procedure, the anesthesia specialist may change your level of sedation or the type of anesthesia to fit your needs. An anesthesia specialist will monitor you closely during the procedure. Let your health care provider know about:  Any allergies you have.  All medicines you are taking, including vitamins, herbs, eye drops, creams, and over-the-counter medicines.  Any use of steroids (by mouth or as a cream).  Any problems you or family members have had with sedatives and anesthetic medicines.  Any blood disorders you have.  Any surgeries you have had.  Any medical conditions you have, such as sleep apnea.  Whether you are pregnant or may be pregnant.  Any use of cigarettes, alcohol, or street drugs. What are the risks? Generally, this is a safe procedure. However, problems may occur, including:  Getting too much medicine (oversedation).  Nausea.  Allergic reaction to medicines.  Trouble breathing. If this happens, a breathing tube may be used to help with breathing. It will be removed when you are awake and breathing on your own.  Heart trouble.  Lung trouble.  Before the procedure Staying hydrated Follow instructions from your health care provider about hydration, which may include:  Up to 2 hours before the procedure - you may continue to drink clear liquids, such as water, clear fruit juice, black coffee, and plain tea.  Eating and drinking restrictions Follow instructions from your health care provider  about eating and drinking, which may include:  8 hours before the procedure - stop eating heavy meals or foods such as meat, fried foods, or fatty foods.  6 hours before the procedure - stop eating light  meals or foods, such as toast or cereal.  6 hours before the procedure - stop drinking milk or drinks that contain milk.  2 hours before the procedure - stop drinking clear liquids.  Medicines Ask your health care provider about:  Changing or stopping your regular medicines. This is especially important if you are taking diabetes medicines or blood thinners.  Taking medicines such as aspirin and ibuprofen. These medicines can thin your blood. Do not take these medicines before your procedure if your health care provider instructs you not to.  Tests and exams  You will have a physical exam.  You may have blood tests done to show: ? How well your kidneys and liver are working. ? How well your blood can clot.  General instructions  Plan to have someone take you home from the hospital or clinic.  If you will be going home right after the procedure, plan to have someone with you for 24 hours.  What happens during the procedure?  Your blood pressure, heart rate, breathing, level of pain and overall condition will be monitored.  An IV tube will be inserted into one of your veins.  Your anesthesia specialist will give you medicines as needed to keep you comfortable during the procedure. This may mean changing the level of sedation.  The procedure will be performed. After the procedure  Your blood pressure, heart rate, breathing rate, and blood oxygen level will be monitored until the medicines you were given have worn off.  Do not drive for 24 hours if you received a sedative.  You may: ? Feel sleepy, clumsy, or nauseous. ? Feel forgetful about what happened after the procedure. ? Have a sore throat if you had a breathing tube during the procedure. ? Vomit. This information  is not intended to replace advice given to you by your health care provider. Make sure you discuss any questions you have with your health care provider. Document Released: 04/29/2005 Document Revised: 01/10/2016 Document Reviewed: 11/24/2015 Elsevier Interactive Patient Education  [employer].

## 2017-07-14 ENCOUNTER — Encounter (HOSPITAL_COMMUNITY): Payer: Self-pay

## 2017-07-14 ENCOUNTER — Other Ambulatory Visit: Payer: Self-pay

## 2017-07-14 ENCOUNTER — Encounter (HOSPITAL_COMMUNITY)
Admission: RE | Admit: 2017-07-14 | Discharge: 2017-07-14 | Disposition: A | Discharge status: Home / Self Care | Payer: Medicare Other | Source: Ambulatory Visit | Attending: Internal Medicine | Admitting: Internal Medicine

## 2017-07-14 DIAGNOSIS — Z01818 Encounter for other preprocedural examination: Secondary | ICD-10-CM | POA: Insufficient documentation

## 2017-07-14 DIAGNOSIS — K51919 Ulcerative colitis, unspecified with unspecified complications: Secondary | ICD-10-CM | POA: Insufficient documentation

## 2017-07-14 LAB — BASIC METABOLIC PANEL
Anion gap: 8 (ref 5–15)
BUN: 18 mg/dL (ref 6–20)
CALCIUM: 9.1 mg/dL (ref 8.9–10.3)
CO2: 27 mmol/L (ref 22–32)
CREATININE: 0.84 mg/dL (ref 0.61–1.24)
Chloride: 103 mmol/L (ref 101–111)
GFR calc Af Amer: >60 mL/min (ref >60)
GLUCOSE: 89 mg/dL (ref 65–99)
Potassium: 4.2 mmol/L (ref 3.5–5.1)
SODIUM: 138 mmol/L (ref 135–145)

## 2017-07-14 LAB — CBC WITH DIFFERENTIAL/PLATELET
BASOS ABS: 0 10*3/uL (ref 0.0–0.1)
Basophils Relative: 0 %
EOS ABS: 0.2 10*3/uL (ref 0.0–0.7)
EOS PCT: 2 %
HCT: 47.6 % (ref 39.0–52.0)
Hemoglobin: 15.7 g/dL (ref 13.0–17.0)
LYMPHS PCT: 29 %
Lymphs Abs: 2.2 10*3/uL (ref 0.7–4.0)
MCH: 32.6 pg (ref 26.0–34.0)
MCHC: 33 g/dL (ref 30.0–36.0)
MCV: 98.8 fL (ref 78.0–100.0)
MONO ABS: 0.9 10*3/uL (ref 0.1–1.0)
Monocytes Relative: 13 %
Neutro Abs: 4.1 10*3/uL (ref 1.7–7.7)
Neutrophils Relative %: 56 %
PLATELETS: 234 10*3/uL (ref 150–400)
RBC: 4.82 MIL/uL (ref 4.22–5.81)
RDW: 12.9 % (ref 11.5–15.5)
WBC: 7.3 10*3/uL (ref 4.0–10.5)

## 2017-07-19 ENCOUNTER — Ambulatory Visit (HOSPITAL_COMMUNITY): Payer: Medicare Other | Admitting: Anesthesiology

## 2017-07-19 ENCOUNTER — Encounter (HOSPITAL_COMMUNITY)
Admission: RE | Disposition: A | Discharge status: Home / Self Care | Payer: Self-pay | Source: Ambulatory Visit | Attending: Internal Medicine

## 2017-07-19 ENCOUNTER — Encounter (HOSPITAL_COMMUNITY): Payer: Self-pay | Admitting: *Deleted

## 2017-07-19 ENCOUNTER — Ambulatory Visit (HOSPITAL_COMMUNITY)
Admission: RE | Admit: 2017-07-19 | Discharge: 2017-07-19 | Disposition: A | Discharge status: Home / Self Care | Payer: Medicare Other | Source: Ambulatory Visit | Attending: Internal Medicine | Admitting: Internal Medicine

## 2017-07-19 DIAGNOSIS — K5130 Ulcerative (chronic) rectosigmoiditis without complications: Secondary | ICD-10-CM | POA: Diagnosis not present

## 2017-07-19 DIAGNOSIS — Z1211 Encounter for screening for malignant neoplasm of colon: Secondary | ICD-10-CM | POA: Diagnosis not present

## 2017-07-19 DIAGNOSIS — I4891 Unspecified atrial fibrillation: Secondary | ICD-10-CM | POA: Diagnosis not present

## 2017-07-19 DIAGNOSIS — H9190 Unspecified hearing loss, unspecified ear: Secondary | ICD-10-CM | POA: Insufficient documentation

## 2017-07-19 DIAGNOSIS — Z7982 Long term (current) use of aspirin: Secondary | ICD-10-CM | POA: Insufficient documentation

## 2017-07-19 DIAGNOSIS — G10 Huntington's disease: Secondary | ICD-10-CM | POA: Diagnosis not present

## 2017-07-19 DIAGNOSIS — F329 Major depressive disorder, single episode, unspecified: Secondary | ICD-10-CM | POA: Insufficient documentation

## 2017-07-19 DIAGNOSIS — K5150 Left sided colitis without complications: Secondary | ICD-10-CM | POA: Diagnosis not present

## 2017-07-19 DIAGNOSIS — K5730 Diverticulosis of large intestine without perforation or abscess without bleeding: Secondary | ICD-10-CM | POA: Insufficient documentation

## 2017-07-19 DIAGNOSIS — R413 Other amnesia: Secondary | ICD-10-CM | POA: Insufficient documentation

## 2017-07-19 DIAGNOSIS — Z79899 Other long term (current) drug therapy: Secondary | ICD-10-CM | POA: Diagnosis not present

## 2017-07-19 DIAGNOSIS — M1990 Unspecified osteoarthritis, unspecified site: Secondary | ICD-10-CM | POA: Diagnosis not present

## 2017-07-19 DIAGNOSIS — K633 Ulcer of intestine: Secondary | ICD-10-CM | POA: Diagnosis not present

## 2017-07-19 DIAGNOSIS — Z8711 Personal history of peptic ulcer disease: Secondary | ICD-10-CM | POA: Diagnosis not present

## 2017-07-19 DIAGNOSIS — K219 Gastro-esophageal reflux disease without esophagitis: Secondary | ICD-10-CM | POA: Insufficient documentation

## 2017-07-19 DIAGNOSIS — Z87891 Personal history of nicotine dependence: Secondary | ICD-10-CM | POA: Diagnosis not present

## 2017-07-19 DIAGNOSIS — E039 Hypothyroidism, unspecified: Secondary | ICD-10-CM | POA: Diagnosis not present

## 2017-07-19 DIAGNOSIS — K449 Diaphragmatic hernia without obstruction or gangrene: Secondary | ICD-10-CM | POA: Diagnosis not present

## 2017-07-19 DIAGNOSIS — E785 Hyperlipidemia, unspecified: Secondary | ICD-10-CM | POA: Insufficient documentation

## 2017-07-19 DIAGNOSIS — K5190 Ulcerative colitis, unspecified, without complications: Secondary | ICD-10-CM | POA: Diagnosis not present

## 2017-07-19 DIAGNOSIS — K51919 Ulcerative colitis, unspecified with unspecified complications: Secondary | ICD-10-CM

## 2017-07-19 HISTORY — PX: COLONOSCOPY WITH PROPOFOL: SHX5780

## 2017-07-19 HISTORY — PX: BIOPSY: SHX5522

## 2017-07-19 SURGERY — COLONOSCOPY WITH PROPOFOL
Anesthesia: Monitor Anesthesia Care

## 2017-07-19 MED ORDER — PROPOFOL 10 MG/ML IV BOLUS
INTRAVENOUS | Status: DC | PRN
  Administered 2017-07-19: 20 mg via INTRAVENOUS

## 2017-07-19 MED ORDER — CHLORHEXIDINE GLUCONATE CLOTH 2 % EX PADS: 6.0000 | MEDICATED_PAD | Freq: Once | CUTANEOUS | Status: DC

## 2017-07-19 MED ORDER — ONDANSETRON 4 MG PO TBDP
ORAL_TABLET | ORAL | Status: AC
  Filled 2017-07-19: qty 1

## 2017-07-19 MED ORDER — PROPOFOL 500 MG/50ML IV EMUL
INTRAVENOUS | Status: DC | PRN
  Administered 2017-07-19: 08:00:00 via INTRAVENOUS
  Administered 2017-07-19: 150 ug/kg/min via INTRAVENOUS

## 2017-07-19 MED ORDER — PROPOFOL 10 MG/ML IV BOLUS
INTRAVENOUS | Status: AC
  Filled 2017-07-19: qty 40

## 2017-07-19 MED ORDER — ONDANSETRON 4 MG PO TBDP
4.0000 mg | ORAL_TABLET | Freq: Once | ORAL | Status: AC
  Administered 2017-07-19: 4 mg via ORAL

## 2017-07-19 MED ORDER — LACTATED RINGERS IV SOLN
INTRAVENOUS | Status: DC
  Administered 2017-07-19: 07:00:00 via INTRAVENOUS

## 2017-07-19 NOTE — Anesthesia Preprocedure Evaluation (Signed)
Anesthesia Evaluation  Patient identified by MRN, date of birth, ID band Patient awake    Airway Mallampati: I       Dental no notable dental hx. (+) Teeth Intact   Pulmonary former smoker,    Pulmonary exam normal        Cardiovascular Exercise Tolerance: Good METS: 5 - 7 Mets + dysrhythmias (stable, rate-controlled) Atrial Fibrillation  Rhythm:Regular Rate:Normal     Neuro/Psych PSYCHIATRIC DISORDERS Depression Huntington's chorea  Neuromuscular disease    GI/Hepatic hiatal hernia, PUD, GERD  Medicated and Controlled,Ulcerative colitis   Endo/Other  Hypothyroidism   Renal/GU      Musculoskeletal   Abdominal Normal abdominal exam  (+)   Peds  Hematology   Anesthesia Other Findings   Reproductive/Obstetrics                             Anesthesia Physical Anesthesia Plan  ASA: III  Anesthesia Plan: MAC   Post-op Pain Management:    Induction:   PONV Risk Score and Plan:   Airway Management Planned: Nasal Cannula  Additional Equipment:   Intra-op Plan:   Post-operative Plan: Extubation in OR  Informed Consent: I have reviewed the patients History and Physical, chart, labs and discussed the procedure including the risks, benefits and alternatives for the proposed anesthesia with the patient or authorized representative who has indicated his/her understanding and acceptance.   Dental advisory given  Plan Discussed with: CRNA  Anesthesia Plan Comments:         Anesthesia Quick Evaluation

## 2017-07-19 NOTE — H&P (Signed)
@LOGO @   Primary Care Physician:  Chevis Pretty, FNP Primary Gastroenterologist:  Dr. Gala Romney  Pre-Procedure History & Physical: HPI:  Sergio Baker is a 82 y.o. 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 Clinically, doing well on [place] alone. GERD well controlled on PPI.  Past Medical History:  Diagnosis Date  . A-fib ([place])   . Abnormality of gait   . Arthritis   . Depression   . Diverticula of colon   . Dysrhythmia    AFib  . Enlarged prostate   . GERD (gastroesophageal reflux disease)   . Hearing loss   . Hemorrhoids   . Hiatal hernia    small  . HOH (hard of hearing)   . Huntington's disease ([place]) 2010  . Hyperlipidemia   . Hypothyroidism   . Joint pain   . Memory deficits 01/17/2014  . 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  . Schatzki's ring   . Ulcerative colitis ([place])    left side    Past Surgical History:  Procedure Laterality Date  . APPENDECTOMY    . CATARACT EXTRACTION W/PHACO Left 05/07/2017   Procedure: CATARACT EXTRACTION PHACO AND INTRAOCULAR LENS PLACEMENT (IOC);  Surgeon: Baruch Goldmann, MD;  Location: [facility];  Service: Ophthalmology;  Laterality: Left;  CDE: 10.49  . CATARACT EXTRACTION W/PHACO Right 06/04/2017   Procedure: CATARACT EXTRACTION PHACO AND INTRAOCULAR LENS PLACEMENT RIGHT EYE;  Surgeon: Baruch Goldmann, MD;  Location: [facility];  Service: Ophthalmology;  Laterality: Right;  CDE: 7.07  . COLONOSCOPY  02/2010   patchy erythema, minimal granularity in distal rectum, left-sided diverticula, ileal diverticulum.  Bx benign. next TCS 02/2012  . COLONOSCOPY  03/24/2012   Dr. Gala Romney- inflammatory changes of the rectum and colon consistent with history of U/C. bx= benign colonic mucosa  . COLONOSCOPY N/A 10/05/2014   RMR: abnormal rectal and sigmoid mucosa as described above status post segmental biopsy. active colitis sigmoid colon.  . cyst removed from urinary  bladder    . ESOPHAGOGASTRODUODENOSCOPY  04/2008   schatzki ring, s/p ED, hh  . LUMBAR FUSION    . SHOULDER ARTHROSCOPY W/ ROTATOR CUFF REPAIR Bilateral   . TONSILLECTOMY      Prior to Admission medications   Medication Sig Start Date End Date Taking? Authorizing Provider  acetaminophen (TYLENOL) 650 MG CR tablet Take 1,300 mg by mouth daily as needed for pain.   Yes [provider]  Aloe Vera 25 MG CAPS Take 25 mg by mouth daily.   Yes [provider]  CALCIUM CITRATE PO Take 1 tablet by mouth daily.   Yes [provider]  Cholecalciferol (VITAMIN D3) 2000 UNITS capsule Take 2,000 Units by mouth daily.   Yes [provider]  citalopram (CELEXA) 20 MG tablet Take 1 tablet (20 mg total) by mouth daily. 06/07/17  Yes Martin, Mary-Margaret, FNP  Glucosamine-Chondroit-Vit C-Mn (GLUCOSAMINE CHONDR 1500 COMPLX PO) Take 1 tablet by mouth daily.   Yes [provider]  haloperidol (HALDOL) 1 MG tablet Take 1.5 tablets (1.5 mg total) by mouth 2 (two) times daily. 06/07/17  Yes Martin, Mary-Margaret, FNP  HYDROcodone-acetaminophen (NORCO/VICODIN) 5-325 MG tablet Take 1 tablet by mouth daily as needed for moderate pain. 06/07/17  Yes Hassell Done, Mary-Margaret, FNP  levothyroxine (SYNTHROID, LEVOTHROID) 50 MCG tablet TAKE ONE (1) TABLET EACH DAY 06/07/17  Yes Martin, Mary-Margaret, FNP  Methylsulfonylmethane (MSM) 1000 MG CAPS Take 1,000 mg by mouth daily.   Yes [provider]  Multiple Vitamin (MULTIVITAMIN WITH MINERALS) TABS Take 1 tablet by mouth daily. For Men 50+   Yes [provider]  Omega-3 Fatty Acids (FISH OIL PO) Take 1 tablet by mouth daily.   Yes [provider]  omeprazole (PRILOSEC) 20 MG capsule TAKE ONE (1) CAPSULE EACH DAY 06/07/17  Yes Hassell Done, Mary-Margaret, FNP  aspirin EC 81 MG tablet Take 81 mg by mouth daily.    [provider]    Allergies as of 06/08/2017  . (No Known Allergies)    Family History   Problem Relation Age of Onset  . Lung cancer Mother 51       deceased  . Other Brother        Posttraumatic stress disorder  . Cancer Brother        skin cancer  . Cancer Sister        thyroid  . Arthritis Sister   . SIDS Brother   . Huntington's disease Daughter   . Colon cancer Neg Hx     Social History   Socioeconomic History  . Marital status: Married    Spouse name: [name]  . Number of children: 1  . Years of education: 12th  . Highest education level: Not on file  Social Needs  . Financial resource strain: Not on file  . Food insecurity - worry: Not on file  . Food insecurity - inability: Not on file  . Transportation needs - medical: Not on file  . Transportation needs - non-medical: Not on file  Occupational History  . Occupation: retired Furniture conservator/restorer  . Occupation: works as Administrator, arts at [employer]  Tobacco Use  . Smoking status: Former Smoker    Packs/day: 0.25    Years: 8.00    Pack years: 2.00    Types: Cigarettes    Last attempt to quit: 05/03/1973    Years since quitting: 44.2  . Smokeless tobacco: Never Used  . Tobacco comment: Quit 1970's   Substance and Sexual Activity  . Alcohol use: No    Alcohol/week: 0.0 oz  . Drug use: No  . Sexual activity: No    Birth control/protection: None  Other Topics Concern  . Not on file  Social History Narrative   Patient lives at home with spouse.   Caffeine Use: 1 cup weekly   Right-handed    Review of Systems: See HPI, otherwise negative ROS  Physical Exam: BP (!) 162/112   Pulse 82   Temp 97.9 F (36.6 C) (Oral)   Resp (!) 24   Ht 5' 6"  (1.676 m)   Wt 155 lb (70.3 kg)   BMI 25.02 kg/m  General:   Alert,  bearded, pleasant and cooperative in NAD Neck:  Supple; no masses or thyromegaly. No significant cervical adenopathy. Lungs:  Clear throughout to auscultation.   No wheezes, crackles, or rhonchi. No acute distress. Heart:  Regular rate and rhythm; no murmurs, clicks, rubs,  or gallops. Abdomen:  Non-distended, normal bowel sounds.  Soft and nontender without appreciable mass or hepatosplenomegaly.  Pulses:  Normal pulses noted. Extremities:  Without clubbing or edema.  Impression:   Pleasant 82 year old gentleman with long-standing left-sided proctocolitis.  Here for perhaps one more surveillance colonoscopy per plan.  Recommendations:  I have offered the patient a surveillance colonoscopy today.  The risks, benefits, limitations, alternatives and imponderables have been reviewed with the patient. Questions have been answered. All parties are agreeable.      Notice: This dictation was prepared  with Dragon dictation along with smaller phrase technology. Any transcriptional errors that result from this process are unintentional and may not be corrected upon review.

## 2017-07-19 NOTE — Transfer of Care (Signed)
Immediate Anesthesia Transfer of Care Note  Patient: Sergio Baker  Procedure(s) Performed: COLONOSCOPY WITH PROPOFOL (N/A ) BIOPSY  Patient Location: PACU  Anesthesia Type:MAC  Level of Consciousness: sedated  Airway & Oxygen Therapy: Patient Spontanous Breathing and Patient connected to nasal cannula oxygen  Post-op Assessment: Report given to RN  Post vital signs: Reviewed and stable  Last Vitals:  Vitals:   07/19/17 0659 07/19/17 0806  BP: (!) 162/112   Pulse: 82 (P) 81  Resp: (!) 24 (P) 14  Temp: 36.6 C (!) (P) 36.4 C  SpO2:  (P) 96%    Last Pain:  Vitals:   07/19/17 0659  TempSrc: Oral      Patients Stated Pain Goal: 8 (21/22/48 2500)  Complications: No apparent anesthesia complications

## 2017-07-19 NOTE — Discharge Instructions (Signed)
Colonoscopy Discharge Instructions  Read the instructions outlined below and refer to this sheet in the next few weeks. These discharge instructions provide you with general information on caring for yourself after you leave the hospital. Your doctor may also give you specific instructions. While your treatment has been planned according to the most current medical practices available, unavoidable complications occasionally occur. If you have any problems or questions after discharge, call Dr. Gala Romney at [phone]. ACTIVITY  You may resume your regular activity, but move at a slower pace for the next 24 hours.   Take frequent rest periods for the next 24 hours.   Walking will help get rid of the air and reduce the bloated feeling in your belly (abdomen).   No driving for 24 hours (because of the medicine (anesthesia) used during the test).    Do not sign any important legal documents or operate any machinery for 24 hours (because of the anesthesia used during the test).  NUTRITION  Drink plenty of fluids.   You may resume your normal diet as instructed by your doctor.   Begin with a light meal and progress to your normal diet. Heavy or fried foods are harder to digest and may make you feel sick to your stomach (nauseated).   Avoid alcoholic beverages for 24 hours or as instructed.  MEDICATIONS  You may resume your normal medications unless your doctor tells you otherwise.  WHAT YOU CAN EXPECT TODAY  Some feelings of bloating in the abdomen.   Passage of more gas than usual.   Spotting of blood in your stool or on the toilet paper.  IF YOU HAD POLYPS REMOVED DURING THE COLONOSCOPY:  No aspirin products for 7 days or as instructed.   No alcohol for 7 days or as instructed.   Eat a soft diet for the next 24 hours.  FINDING OUT THE RESULTS OF YOUR TEST Not all test results are available during your visit. If your test results are not back during the visit, make an appointment  with your caregiver to find out the results. Do not assume everything is normal if you have not heard from your caregiver or the medical facility. It is important for you to follow up on all of your test results.  SEEK IMMEDIATE MEDICAL ATTENTION IF:  You have more than a spotting of blood in your stool.   Your belly is swollen (abdominal distention).   You are nauseated or vomiting.   You have a temperature over 101.   You have abdominal pain or discomfort that is severe or gets worse throughout the day.     Diverticulosis information provided  Further recommendations to follow pending review of pathology report  Office visit with [place] in 6 months     Diverticulosis Diverticulosis is a condition that develops when small pouches (diverticula) form in the wall of the large intestine (colon). The colon is where water is absorbed and stool is formed. The pouches form when the inside layer of the colon pushes through weak spots in the outer layers of the colon. You may have a few pouches or many of them. What are the causes? The cause of this condition is not known. What increases the risk? The following factors may make you more likely to develop this condition:  Being older than age 60. Your risk for this condition increases with age. Diverticulosis is rare among people younger than age 35. By age 62, many people have it.  Eating a low-fiber  diet.  Having frequent constipation.  Being overweight.  Not getting enough exercise.  Smoking.  Taking over-the-counter pain medicines, like aspirin and ibuprofen.  Having a family history of diverticulosis.  What are the signs or symptoms? In most people, there are no symptoms of this condition. If you do have symptoms, they may include:  Bloating.  Cramps in the abdomen.  Constipation or diarrhea.  Pain in the lower left side of the abdomen.  How is this diagnosed? This condition is most often diagnosed during an exam for  other colon problems. Because diverticulosis usually has no symptoms, it often cannot be diagnosed independently. This condition may be diagnosed by:  Using a flexible scope to examine the colon (colonoscopy).  Taking an X-ray of the colon after dye has been put into the colon (barium enema).  Doing a CT scan.  How is this treated? You may not need treatment for this condition if you have never developed an infection related to diverticulosis. If you have had an infection before, treatment may include:  Eating a high-fiber diet. This may include eating more fruits, vegetables, and grains.  Taking a fiber supplement.  Taking a live bacteria supplement (probiotic).  Taking medicine to relax your colon.  Taking antibiotic medicines.  Follow these instructions at home:  Drink 6-8 glasses of water or more each day to prevent constipation.  Try not to strain when you have a bowel movement.  If you have had an infection before: ? Eat more fiber as directed by your health care provider or your diet and nutrition specialist (dietitian). ? Take a fiber supplement or probiotic, if your health care provider approves.  Take over-the-counter and prescription medicines only as told by your health care provider.  If you were prescribed an antibiotic, take it as told by your health care provider. Do not stop taking the antibiotic even if you start to feel better.  Keep all follow-up visits as told by your health care provider. This is important. Contact a health care provider if:  You have pain in your abdomen.  You have bloating.  You have cramps.  You have not had a bowel movement in 3 days. Get help right away if:  Your pain gets worse.  Your bloating becomes very bad.  You have a fever or chills, and your symptoms suddenly get worse.  You vomit.  You have bowel movements that are bloody or black.  You have bleeding from your rectum. Summary  Diverticulosis is a  condition that develops when small pouches (diverticula) form in the wall of the large intestine (colon).  You may have a few pouches or many of them.  This condition is most often diagnosed during an exam for other colon problems.  If you have had an infection related to diverticulosis, treatment may include increasing the fiber in your diet, taking supplements, or taking medicines. This information is not intended to replace advice given to you by your health care provider. Make sure you discuss any questions you have with your health care provider. Document Released: 04/30/2004 Document Revised: 06/22/2016 Document Reviewed: 06/22/2016 Elsevier Interactive Patient Education  2017 [employer].

## 2017-07-19 NOTE — Op Note (Signed)
[facility] Patient Name: Sergio Baker Procedure Date: 07/19/2017 7:13 AM MRN: [id number] Date of Birth: 1934-11-03 Attending MD: Norvel Richards , MD CSN: [id number] Age: 82 Admit Type: Outpatient Procedure:                Colonoscopy Indications:              High risk colon cancer surveillance: Ulcerative                            left sided colitis Providers:                Norvel Richards, MD, Otis Peak B. Sharon Seller, RN,                            Randa Spike, Technician, Nelma Rothman, Technician Referring MD:              Medicines:                Midazolam mg IV Complications:            No immediate complications. Estimated Blood Loss:     Estimated blood loss was minimal. Procedure:                Pre-Anesthesia Assessment:                           - Prior to the procedure, a History and Physical                            was performed, and patient medications and                            allergies were reviewed. The patient's tolerance of                            previous anesthesia was also reviewed. The risks                            and benefits of the procedure and the sedation                            options and risks were discussed with the patient.                            All questions were answered, and informed consent                            was obtained. Prior Anticoagulants: The patient has                            taken no previous anticoagulant or antiplatelet                            agents. ASA Grade Assessment: III - A patient with  severe systemic disease. After reviewing the risks                            and benefits, the patient was deemed in                            satisfactory condition to undergo the procedure.                           After obtaining informed consent, the colonoscope                            was passed under direct vision. Throughout the   procedure, the patient's blood pressure, pulse, and                            oxygen saturations were monitored continuously. The                            EC-[id number] ([id number]) scope was introduced through                            the and advanced to the the terminal ileum. The                            colonoscopy was performed without difficulty. The                            patient tolerated the procedure well. The quality                            of the bowel preparation was adequate. The terminal                            ileum, ileocecal valve, appendiceal orifice, and                            rectum were photographed. Scope In: 7:43:37 AM Scope Out: 7:59:30 AM Scope Withdrawal Time: 0 hours 9 minutes 35 seconds  Total Procedure Duration: 0 hours 15 minutes 53 seconds  Findings:      The perianal and digital rectal examinations were normal.      Multiple small and large-mouthed diverticula were found in the sigmoid       colon. Segmental biopsies ofrectum and colon taken for histologic study.      Minimal granularity of the rectal mucosa diffusely. Vascular pattern was       preserved. No erosions or ulcerations found. The colon, aside from       sigmoid diverticula, appered normal normal. Distal 5 cm of the terminal       ileal mucosa appeared normal except for single diverticulum segmental       biopsies of the right, transverse, descending, sigmoid and rectal mucosa       were taken for histologic study.Biopsies, Impression:               - Diverticulosis in the sigmoid colon.  Minimal                            granularity of the rectum.                           - Status post biopsies. Moderate Sedation:      Moderate (conscious) sedation was personally administered by an       anesthesia professional. The following parameters were monitored: oxygen       saturation, heart rate, blood pressure, respiratory rate, EKG, adequacy       of pulmonary ventilation, and  response to care. Total physician       intraservice time was 24 minutes. Recommendation:           - Patient has a contact number available for                            emergencies. The signs and symptoms of potential                            delayed complications were discussed with the                            patient. Return to normal activities tomorrow.                            Written discharge instructions were provided to the                            patient.                           - Resume previous diet.                           - Continue present medications.                           - Await pathology results. ffice visit with [place] in 6                            months                           - No repeat colonoscopy due to age. Procedure Code(s):        --- Professional ---                           [phone], Colonoscopy, flexible; with biopsy, single                            or multiple Diagnosis Code(s):        --- Professional ---                           K51.50, Left sided colitis without complications  K57.30, Diverticulosis of large intestine without                            perforation or abscess without bleeding CPT copyright 2016 American Medical Association. All rights reserved. The codes documented in this report are preliminary and upon coder review may  be revised to meet current compliance requirements. Cristopher Estimable. Shaleigh Laubscher, MD Norvel Richards, MD 07/19/2017 9:33:43 AM This report has been signed electronically. Number of Addenda: 0

## 2017-07-19 NOTE — Anesthesia Postprocedure Evaluation (Signed)
Anesthesia Post Note  Patient: Sergio Baker  Procedure(s) Performed: COLONOSCOPY WITH PROPOFOL (N/A ) BIOPSY  Patient location during evaluation: PACU Anesthesia Type: MAC Level of consciousness: awake and alert and oriented Pain management: pain level controlled Vital Signs Assessment: post-procedure vital signs reviewed and stable Respiratory status: spontaneous breathing Cardiovascular status: stable Postop Assessment: no apparent nausea or vomiting Anesthetic complications: no     Last Vitals:  Vitals:   07/19/17 0821 07/19/17 0827  BP: 105/75 108/72  Pulse: 75 78  Resp: 14 18  Temp:    SpO2: 95% 94%    Last Pain:  Vitals:   07/19/17 0659  TempSrc: Oral                 Fenton Candee

## 2017-07-20 ENCOUNTER — Encounter: Payer: Self-pay | Admitting: Internal Medicine

## 2017-07-21 ENCOUNTER — Encounter (HOSPITAL_COMMUNITY): Payer: Self-pay | Admitting: Internal Medicine

## 2017-08-12 DIAGNOSIS — L821 Other seborrheic keratosis: Secondary | ICD-10-CM | POA: Diagnosis not present

## 2017-08-12 DIAGNOSIS — B078 Other viral warts: Secondary | ICD-10-CM | POA: Diagnosis not present

## 2017-08-12 DIAGNOSIS — L218 Other seborrheic dermatitis: Secondary | ICD-10-CM | POA: Diagnosis not present

## 2017-09-14 DIAGNOSIS — M47816 Spondylosis without myelopathy or radiculopathy, lumbar region: Secondary | ICD-10-CM | POA: Diagnosis not present

## 2017-09-20 ENCOUNTER — Encounter: Payer: Self-pay | Admitting: Nurse Practitioner

## 2017-09-20 ENCOUNTER — Ambulatory Visit (INDEPENDENT_AMBULATORY_CARE_PROVIDER_SITE_OTHER): Payer: Medicare Other | Admitting: Nurse Practitioner

## 2017-09-20 VITALS — BP 149/82 | HR 67 | Temp 96.7°F | Ht 66.0 in | Wt 164.0 lb

## 2017-09-20 DIAGNOSIS — E034 Atrophy of thyroid (acquired): Secondary | ICD-10-CM

## 2017-09-20 DIAGNOSIS — N400 Enlarged prostate without lower urinary tract symptoms: Secondary | ICD-10-CM

## 2017-09-20 DIAGNOSIS — Z6827 Body mass index (BMI) 27.0-27.9, adult: Secondary | ICD-10-CM | POA: Diagnosis not present

## 2017-09-20 DIAGNOSIS — E785 Hyperlipidemia, unspecified: Secondary | ICD-10-CM

## 2017-09-20 DIAGNOSIS — K219 Gastro-esophageal reflux disease without esophagitis: Secondary | ICD-10-CM

## 2017-09-20 DIAGNOSIS — K5730 Diverticulosis of large intestine without perforation or abscess without bleeding: Secondary | ICD-10-CM

## 2017-09-20 DIAGNOSIS — G10 Huntington's disease: Secondary | ICD-10-CM | POA: Diagnosis not present

## 2017-09-20 DIAGNOSIS — F3342 Major depressive disorder, recurrent, in full remission: Secondary | ICD-10-CM

## 2017-09-20 DIAGNOSIS — I482 Chronic atrial fibrillation, unspecified: Secondary | ICD-10-CM

## 2017-09-20 NOTE — Patient Instructions (Signed)

## 2017-09-20 NOTE — Progress Notes (Signed)
Subjective:    Patient ID: Sergio Baker, male    DOB: 08/07/1935, 83 y.o.   MRN: [id number]  HPI  Sergio Baker is here today for follow up of chronic medical problem.  Outpatient Encounter Medications as of 09/20/2017  Medication Sig  . acetaminophen (TYLENOL) 650 MG CR tablet Take 1,300 mg by mouth daily as needed for pain.  . Aloe Vera 25 MG CAPS Take 25 mg by mouth daily.  Marland Kitchen aspirin EC 81 MG tablet Take 81 mg by mouth daily.  Marland Kitchen CALCIUM CITRATE PO Take 1 tablet by mouth daily.  . Cholecalciferol (VITAMIN D3) 2000 UNITS capsule Take 2,000 Units by mouth daily.  . citalopram (CELEXA) 20 MG tablet Take 1 tablet (20 mg total) by mouth daily.  . Glucosamine-Chondroit-Vit C-Mn (GLUCOSAMINE CHONDR 1500 COMPLX PO) Take 1 tablet by mouth daily.  . haloperidol (HALDOL) 1 MG tablet Take 1.5 tablets (1.5 mg total) by mouth 2 (two) times daily.  Marland Kitchen HYDROcodone-acetaminophen (NORCO/VICODIN) 5-325 MG tablet Take 1 tablet by mouth daily as needed for moderate pain.  Marland Kitchen levothyroxine (SYNTHROID, LEVOTHROID) 50 MCG tablet TAKE ONE (1) TABLET EACH DAY  . Methylsulfonylmethane (MSM) 1000 MG CAPS Take 1,000 mg by mouth daily.  . Multiple Vitamin (MULTIVITAMIN WITH MINERALS) TABS Take 1 tablet by mouth daily. For Men 50+  . Omega-3 Fatty Acids (FISH OIL PO) Take 1 tablet by mouth daily.  Marland Kitchen omeprazole (PRILOSEC) 20 MG capsule TAKE ONE (1) CAPSULE EACH DAY     1. Chronic atrial fibrillation (HCC)  denies any palpitations  2. Gastroesophageal reflux disease, esophagitis presence not specified  Has reflux symptoms daily  3. Hypothyroidism due to acquired atrophy of thyroid  No problems that e is aware of.  4. Huntington's chorea ([place])  He sees neurologist every 3 months. No changes were made at last visit  5. Benign prostatic hyperplasia without lower urinary tract symptoms  No problems voiding currently  6. Diverticulosis of large intestine without hemorrhage  Had colonoscopy in December and was  diagnosed with ulcerative colitis which is currently better.  7. Hyperlipidemia with target LDL less than 100  Watches diet  8. Recurrent major depressive disorder, in full remission ([place])  Is currently doing well.  9. BMI 27.0-27.9,adult  No recent weight changes    New complaints: None today  Social history: Lives with wife. His daughter also has hunting's and she is in a nursing facility.    Review of Systems  Constitutional: Negative for activity change and appetite change.  HENT: Negative.   Eyes: Negative for pain.  Respiratory: Negative for shortness of breath.   Cardiovascular: Negative for chest pain, palpitations and leg swelling.  Gastrointestinal: Negative for abdominal pain.  Endocrine: Negative for polydipsia.  Genitourinary: Negative.   Skin: Negative for rash.  Neurological: Negative for dizziness, weakness and headaches.  Hematological: Does not bruise/bleed easily.  Psychiatric/Behavioral: Negative.   All other systems reviewed and are negative.      Objective:   Physical Exam  Constitutional: He is oriented to person, place, and time. He appears well-developed and well-nourished.  HENT:  Head: Normocephalic.  Right Ear: External ear normal.  Left Ear: External ear normal.  Nose: Nose normal.  Mouth/Throat: Oropharynx is clear and moist.  Eyes: EOM are normal. Pupils are equal, round, and reactive to light.  Neck: Normal range of motion. Neck supple. No JVD present. No thyromegaly present.  Cardiovascular: Normal rate, regular rhythm, normal heart sounds and intact distal pulses.  Exam reveals no gallop and no friction rub.  No murmur heard. Pulmonary/Chest: Effort normal and breath sounds normal. No respiratory distress. He has no wheezes. He has no rales. He exhibits no tenderness.  Abdominal: Soft. Bowel sounds are normal. He exhibits no mass. There is no tenderness.  Genitourinary: Prostate normal and penis normal.  Musculoskeletal: Normal range  of motion. He exhibits no edema.  Lymphadenopathy:    He has no cervical adenopathy.  Neurological: He is alert and oriented to person, place, and time. No cranial nerve deficit.  Skin: Skin is warm and dry.  Psychiatric: He has a normal mood and affect. His behavior is normal. Judgment and thought content normal.    BP (!) 149/82   Pulse 67   Temp (!) 96.7 F (35.9 C) (Oral)   Ht 5' 6"  (1.676 m)   Wt 164 lb (74.4 kg)   BMI 26.47 kg/m         Assessment & Plan:  1. Chronic atrial fibrillation (HCC) Avoid caffeine  2. Gastroesophageal reflux disease, esophagitis presence not specified Avoid spicy foods Do not eat 2 hours prior to bedtime  3. Hypothyroidism due to acquired atrophy of thyroid - Thyroid Panel With TSH  4. Huntington's chorea ([place]) Fall precautions  5. Benign prostatic hyperplasia without lower urinary tract symptoms Keep follow up with urologist  6. Diverticulosis of large intestine without hemorrhage Watch diet  7. Hyperlipidemia with target LDL less than 100 Low fat diet - CMP14+EGFR - Lipid panel  8. Recurrent major depressive disorder, in full remission ([place]) stress management  9. BMI 27.0-27.9,adult Discussed diet and exercise for person with BMI >25 Will recheck weight in 3-6 months     Labs pending Health maintenance reviewed Diet and exercise encouraged Continue all meds Follow up  In 3 months   [place], FNP

## 2017-09-21 LAB — THYROID PANEL WITH TSH
FREE THYROXINE INDEX: 1.8 (ref 1.2–4.9)
T3 Uptake Ratio: 29 % (ref 24–39)
T4 TOTAL: 6.1 ug/dL (ref 4.5–12.0)
TSH: 2.84 u[IU]/mL (ref 0.450–4.500)

## 2017-09-21 LAB — CMP14+EGFR
ALK PHOS: 59 IU/L (ref 39–117)
ALT: 16 IU/L (ref 0–44)
AST: 25 IU/L (ref 0–40)
Albumin/Globulin Ratio: 1.6 (ref 1.2–2.2)
Albumin: 4.4 g/dL (ref 3.5–4.7)
BUN / CREAT RATIO: 19 (ref 10–24)
BUN: 15 mg/dL (ref 8–27)
Bilirubin Total: 0.8 mg/dL (ref 0.0–1.2)
CHLORIDE: 102 mmol/L (ref 96–106)
CO2: 24 mmol/L (ref 20–29)
CREATININE: 0.8 mg/dL (ref 0.76–1.27)
Calcium: 9.2 mg/dL (ref 8.6–10.2)
GFR calc non Af Amer: 84 mL/min/{1.73_m2} (ref >59)
GFR, EST AFRICAN AMERICAN: 98 mL/min/{1.73_m2} (ref >59)
GLUCOSE: 101 mg/dL — AB (ref 65–99)
Globulin, Total: 2.7 g/dL (ref 1.5–4.5)
POTASSIUM: 4.3 mmol/L (ref 3.5–5.2)
SODIUM: 143 mmol/L (ref 134–144)
Total Protein: 7.1 g/dL (ref 6.0–8.5)

## 2017-09-21 LAB — LIPID PANEL
CHOL/HDL RATIO: 4.7 ratio (ref 0.0–5.0)
Cholesterol, Total: 194 mg/dL (ref 100–199)
HDL: 41 mg/dL (ref >39)
LDL Calculated: 136 mg/dL — ABNORMAL HIGH (ref 0–99)
Triglycerides: 83 mg/dL (ref 0–149)
VLDL Cholesterol Cal: 17 mg/dL (ref 5–40)

## 2017-10-04 ENCOUNTER — Ambulatory Visit (INDEPENDENT_AMBULATORY_CARE_PROVIDER_SITE_OTHER): Payer: Medicare Other | Admitting: *Deleted

## 2017-10-04 ENCOUNTER — Ambulatory Visit: Payer: Medicare Other | Admitting: Neurology

## 2017-10-04 NOTE — Progress Notes (Signed)
Erroneous encounter

## 2017-11-29 ENCOUNTER — Encounter: Payer: Self-pay | Admitting: Nurse Practitioner

## 2017-11-29 ENCOUNTER — Ambulatory Visit (INDEPENDENT_AMBULATORY_CARE_PROVIDER_SITE_OTHER): Payer: Medicare Other | Admitting: Nurse Practitioner

## 2017-11-29 VITALS — BP 130/82 | HR 78 | Temp 97.1°F | Ht 66.0 in | Wt 159.0 lb

## 2017-11-29 DIAGNOSIS — J301 Allergic rhinitis due to pollen: Secondary | ICD-10-CM | POA: Diagnosis not present

## 2017-11-29 MED ORDER — FLUTICASONE PROPIONATE 50 MCG/ACT NA SUSP: 2.0000 | Freq: Every day | NASAL | 6 refills | Status: DC

## 2017-11-29 NOTE — Patient Instructions (Signed)

## 2017-11-29 NOTE — Progress Notes (Signed)
   Subjective:    Patient ID: Sergio Baker, male    DOB: [birth year], 83 y.o.   MRN: [id number]  HPI  Patient come sin c/lo sneezing, runny nose and watery eyes. Has not taken any meds.   Review of Systems  Constitutional: Negative for chills and fever.  HENT: Positive for congestion, rhinorrhea and sneezing. Negative for sore throat and trouble swallowing.   Eyes: Negative for visual disturbance.  Respiratory: Negative.  Negative for cough.   Cardiovascular: Negative.   Neurological: Negative.   Psychiatric/Behavioral: Negative.   All other systems reviewed and are negative.      Objective:   Physical Exam  Constitutional: He appears well-developed and well-nourished. No distress.  HENT:  Right Ear: Hearing, tympanic membrane, external ear and ear canal normal.  Left Ear: Hearing, tympanic membrane, external ear and ear canal normal.  Nose: Mucosal edema and rhinorrhea present. Right sinus exhibits no maxillary sinus tenderness and no frontal sinus tenderness. Left sinus exhibits no maxillary sinus tenderness and no frontal sinus tenderness.  Mouth/Throat: Uvula is midline, oropharynx is clear and moist and mucous membranes are normal.   BP 130/82   Pulse 78   Temp (!) 97.1 F (36.2 C) (Oral)   Ht 5' 6"  (1.676 m)   Wt 159 lb (72.1 kg)   BMI 25.66 kg/m        Assessment & Plan:   1. Seasonal allergic rhinitis due to pollen    Meds ordered this encounter  Medications  . fluticasone (FLONASE) 50 MCG/ACT nasal spray    Sig: Place 2 sprays into both nostrils daily.    Dispense:  16 g    Refill:  6    Order Specific Question:   Supervising Provider    Answer:   Assunta Found L [[id number]]   claritin otc Allergy eye drops OTC Force fluids RTO prn  Mary-Margaret Hassell Done, FNP

## 2017-12-07 DIAGNOSIS — M47816 Spondylosis without myelopathy or radiculopathy, lumbar region: Secondary | ICD-10-CM | POA: Diagnosis not present

## 2017-12-24 ENCOUNTER — Ambulatory Visit: Payer: Medicare Other | Admitting: Nurse Practitioner

## 2017-12-24 ENCOUNTER — Other Ambulatory Visit: Payer: Medicare Other

## 2018-01-18 ENCOUNTER — Ambulatory Visit: Payer: Medicare Other | Admitting: Nurse Practitioner

## 2018-01-18 ENCOUNTER — Encounter: Payer: Self-pay | Admitting: Nurse Practitioner

## 2018-01-18 VITALS — BP 164/99 | HR 71 | Temp 96.7°F | Ht 66.0 in | Wt 159.8 lb

## 2018-01-18 DIAGNOSIS — K219 Gastro-esophageal reflux disease without esophagitis: Secondary | ICD-10-CM | POA: Diagnosis not present

## 2018-01-18 DIAGNOSIS — K5100 Ulcerative (chronic) pancolitis without complications: Secondary | ICD-10-CM | POA: Diagnosis not present

## 2018-01-18 NOTE — Progress Notes (Signed)
Referring Provider: Chevis Pretty, * Primary Care Physician:  Chevis Pretty, FNP Primary GI:  Dr. Gala Romney  Chief Complaint  Patient presents with  . ulcerative pancolitis    f/u, doing ok    HPI:   Sergio Baker is a 83 y.o. male who presents for six-month follow-up on ulcerative pancolitis.  The patient was last seen in our office 06/08/2017 for the same as well as GERD.  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  He stopped mesalamine due to cost and stated at his last visit he was doing very well on aloe vera.  3 bowel movements daily with no diarrhea or constipation.  Scant amount of red blood on occasion earlier in the year.  Reflux well controlled on omeprazole.  Recommended 1 more surveillance colonoscopy, given his age.  Colonoscopy was completed 07/19/2017 and found sigmoid diverticulosis, minimal granularity of the rectum status post biopsy.  Surgical pathology found the biopsies to be a mix of benign colon mucosa, some with lymphoid aggregates and some with focal surface erosions.  Recommended to up in 6 months.  Today he states he's doing well. Denies abdominal pain, N/Vhematochezia, melena, fever, chills, unintentional weight loss. Still on Aloe Vera for his disease. GERD continues to do well on PPI. Denies chest pain, dyspnea, dizziness, lightheadedness, syncope, near syncope. Denies any other upper or lower GI symptoms.  Past Medical History:  Diagnosis Date  . A-fib ([place])   . Abnormality of gait   . Arthritis   . Depression   . Diverticula of colon   . Dysrhythmia    AFib  . Enlarged prostate   . GERD (gastroesophageal reflux disease)   . Hearing loss   . Hemorrhoids   . Hiatal hernia    small  . HOH (hard of hearing)   . Huntington's disease ([place]) 2010  . Hyperlipidemia   . Hypothyroidism   . Joint pain   . Memory deficits 01/17/2014  . Proctocolitis, ulcerative ([place])    Diagnosed in the 1980s.  . Schatzki's ring   . Ulcerative  colitis ([place])    left side    Past Surgical History:  Procedure Laterality Date  . APPENDECTOMY    . BIOPSY  07/19/2017   Procedure: BIOPSY;  Surgeon: Daneil Dolin, MD;  Location: [facility];  Service: Endoscopy;;  colon  . CATARACT EXTRACTION W/PHACO Left 05/07/2017   Procedure: CATARACT EXTRACTION PHACO AND INTRAOCULAR LENS PLACEMENT (IOC);  Surgeon: Baruch Goldmann, MD;  Location: [facility];  Service: Ophthalmology;  Laterality: Left;  CDE: 10.49  . CATARACT EXTRACTION W/PHACO Right 06/04/2017   Procedure: CATARACT EXTRACTION PHACO AND INTRAOCULAR LENS PLACEMENT RIGHT EYE;  Surgeon: Baruch Goldmann, MD;  Location: [facility];  Service: Ophthalmology;  Laterality: Right;  CDE: 7.07  . COLONOSCOPY  02/2010   patchy erythema, minimal granularity in distal rectum, left-sided diverticula, ileal diverticulum.  Bx benign. next TCS 02/2012  . COLONOSCOPY  03/24/2012   Dr. Gala Romney- inflammatory changes of the rectum and colon consistent with history of U/C. bx= benign colonic mucosa  . COLONOSCOPY N/A 10/05/2014   RMR: abnormal rectal and sigmoid mucosa as described above status post segmental biopsy. active colitis sigmoid colon.  . COLONOSCOPY WITH PROPOFOL N/A 07/19/2017   Procedure: COLONOSCOPY WITH PROPOFOL;  Surgeon: Daneil Dolin, MD;  Location: [facility];  Service: Endoscopy;  Laterality: N/A;  7:30am  . cyst removed from urinary bladder    . ESOPHAGOGASTRODUODENOSCOPY  04/2008   schatzki ring,  s/p ED, hh  . LUMBAR FUSION    . SHOULDER ARTHROSCOPY W/ ROTATOR CUFF REPAIR Bilateral   . TONSILLECTOMY      Current Outpatient Medications  Medication Sig Dispense Refill  . acetaminophen (TYLENOL) 650 MG CR tablet Take 1,300 mg by mouth daily as needed for pain.    . Aloe Vera 25 MG CAPS Take 25 mg by mouth daily.    Marland Kitchen aspirin EC 81 MG tablet Take 81 mg by mouth daily.    Marland Kitchen CALCIUM CITRATE PO Take 1 tablet by mouth daily.    . Cholecalciferol (VITAMIN D3) 2000 UNITS capsule Take 2,000 Units by  mouth daily.    . citalopram (CELEXA) 20 MG tablet Take 1 tablet (20 mg total) by mouth daily. 90 tablet 1  . Glucosamine-Chondroit-Vit C-Mn (GLUCOSAMINE CHONDR 1500 COMPLX PO) Take 1 tablet by mouth daily.    . haloperidol (HALDOL) 1 MG tablet Take 1.5 tablets (1.5 mg total) by mouth 2 (two) times daily. 270 tablet 1  . HYDROcodone-acetaminophen (NORCO/VICODIN) 5-325 MG tablet Take 1 tablet by mouth daily as needed for moderate pain. 30 tablet 0  . levothyroxine (SYNTHROID, LEVOTHROID) 50 MCG tablet TAKE ONE (1) TABLET EACH DAY 90 tablet 1  . Methylsulfonylmethane (MSM) 1000 MG CAPS Take 1,000 mg by mouth daily.    . Multiple Vitamin (MULTIVITAMIN WITH MINERALS) TABS Take 1 tablet by mouth daily. For Men 50+    . Omega-3 Fatty Acids (FISH OIL PO) Take 1 tablet by mouth daily.    Marland Kitchen omeprazole (PRILOSEC) 20 MG capsule TAKE ONE (1) CAPSULE EACH DAY 90 capsule 1   No current facility-administered medications for this visit.     Allergies as of 01/18/2018  . (No Known Allergies)    Family History  Problem Relation Age of Onset  . Lung cancer Mother 55       deceased  . Other Brother        Posttraumatic stress disorder  . Cancer Brother        skin cancer  . Cancer Sister        thyroid  . Arthritis Sister   . SIDS Brother   . Huntington's disease Daughter   . Colon cancer Neg Hx     Social History   Socioeconomic History  . Marital status: Married    Spouse name: [name]  . Number of children: 1  . Years of education: 12th  . Highest education level: Not on file  Occupational History  . Occupation: retired Furniture conservator/restorer  . Occupation: works as Administrator, arts at [employer]  . Financial resource strain: Not on file  . Food insecurity:    Worry: Not on file    Inability: Not on file  . Transportation needs:    Medical: Not on file    Non-medical: Not on file  Tobacco Use  . Smoking status: Former Smoker    Packs/day: 0.25    Years: 8.00    Pack years: 2.00     Types: Cigarettes    Last attempt to quit: 05/03/1973    Years since quitting: 44.7  . Smokeless tobacco: Never Used  . Tobacco comment: Quit 1970's   Substance and Sexual Activity  . Alcohol use: No    Alcohol/week: 0.0 oz  . Drug use: No  . Sexual activity: Never    Birth control/protection: None  Lifestyle  . Physical activity:    Days per week: Not on file    Minutes per  session: Not on file  . Stress: Not on file  Relationships  . Social connections:    Talks on phone: Not on file    Gets together: Not on file    Attends religious service: Not on file    Active member of club or organization: Not on file    Attends meetings of clubs or organizations: Not on file    Relationship status: Not on file  Other Topics Concern  . Not on file  Social History Narrative   Patient lives at home with spouse.   Caffeine Use: 1 cup weekly   Right-handed    Review of Systems: General: Negative for anorexia, weight loss, fever, chills, fatigue, weakness. ENT: Negative for hoarseness, difficulty swallowing. CV: Negative for chest pain, angina, peripheral edema. Admits heartbeat irregularity. Respiratory: Negative for dyspnea at rest, cough, sputum, wheezing.  GI: See history of present illness.  Endo: Negative for unusual weight change.  Heme: Negative for bruising or bleeding.   Physical Exam: BP (!) 164/99   Pulse 71   Temp (!) 96.7 F (35.9 C) (Oral)   Ht 5' 6"  (1.676 m)   Wt 159 lb 12.8 oz (72.5 kg)   BMI 25.79 kg/m  General:   Alert and oriented. Pleasant and cooperative. Well-nourished and well-developed.  Eyes:  Without icterus, sclera clear and conjunctiva pink.  Ears:  Normal auditory acuity. Cardiovascular:  Irregularly irregular without murmurs appreciated. Extremities without clubbing or edema. Respiratory:  Clear to auscultation bilaterally. No wheezes, rales, or rhonchi. No distress.  Gastrointestinal:  +BS, soft, non-tender and non-distended. No HSM noted. No  guarding or rebound. No masses appreciated.  Rectal:  Deferred  Musculoskalatal:  Symmetrical without gross deformities. Neurologic:  Alert and oriented x4;  grossly normal neurologically. Psych:  Alert and cooperative. Normal mood and affect. Heme/Lymph/Immune: No excessive bruising noted.    01/18/2018 9:26 AM   Disclaimer: This note was dictated with voice recognition software. Similar sounding words can inadvertently be transcribed and may not be corrected upon review.

## 2018-01-18 NOTE — Patient Instructions (Signed)
1. Continue your current medications. 2. Follow-up in 6 months with Dr. Gala Romney. 3. Call us if you have any questions or concerns.  At [facility] we value your feedback. You may receive a survey about your visit today. Please share your experience as we strive to create trusting relationships with our patients to provide genuine, compassionate, quality care.  It was great to meet you today!  I hope you have a great summer!!

## 2018-01-18 NOTE — Progress Notes (Signed)
CC'D TO PCP °

## 2018-01-18 NOTE — Assessment & Plan Note (Signed)
GERD doing well on PPI.  Continue medications, follow-up in 6 months.

## 2018-01-18 NOTE — Assessment & Plan Note (Signed)
Continues to do well.  Denies any abdominal pain, weight loss, changes in stools, diarrhea, hematochezia.  He is not on any active UC medicines other than aloe vera supplementation.  His colonoscopy was updated at the end of 2018 which was mostly unremarkable.  He has no complaints today.  Recommend he continue his current medications and follow-up in 6 months.  Lab work was recently completed including CMP.  Given no UC medications no need for every 1-monthCBC without concerns for bleeding.

## 2018-02-10 DIAGNOSIS — L218 Other seborrheic dermatitis: Secondary | ICD-10-CM | POA: Diagnosis not present

## 2018-02-10 DIAGNOSIS — L821 Other seborrheic keratosis: Secondary | ICD-10-CM | POA: Diagnosis not present

## 2018-03-08 DIAGNOSIS — M47816 Spondylosis without myelopathy or radiculopathy, lumbar region: Secondary | ICD-10-CM | POA: Diagnosis not present

## 2018-04-19 ENCOUNTER — Other Ambulatory Visit: Payer: Self-pay | Admitting: Nurse Practitioner

## 2018-04-19 DIAGNOSIS — E034 Atrophy of thyroid (acquired): Secondary | ICD-10-CM

## 2018-04-26 ENCOUNTER — Other Ambulatory Visit: Payer: Self-pay | Admitting: Nurse Practitioner

## 2018-04-26 DIAGNOSIS — K219 Gastro-esophageal reflux disease without esophagitis: Secondary | ICD-10-CM

## 2018-04-28 ENCOUNTER — Encounter: Payer: Self-pay | Admitting: Nurse Practitioner

## 2018-04-28 ENCOUNTER — Ambulatory Visit (INDEPENDENT_AMBULATORY_CARE_PROVIDER_SITE_OTHER): Payer: Medicare Other | Admitting: Nurse Practitioner

## 2018-04-28 VITALS — BP 131/77 | HR 74 | Temp 96.9°F | Ht 66.0 in | Wt 164.0 lb

## 2018-04-28 DIAGNOSIS — K5730 Diverticulosis of large intestine without perforation or abscess without bleeding: Secondary | ICD-10-CM

## 2018-04-28 DIAGNOSIS — R269 Unspecified abnormalities of gait and mobility: Secondary | ICD-10-CM

## 2018-04-28 DIAGNOSIS — G10 Huntington's disease: Secondary | ICD-10-CM

## 2018-04-28 DIAGNOSIS — K219 Gastro-esophageal reflux disease without esophagitis: Secondary | ICD-10-CM | POA: Diagnosis not present

## 2018-04-28 DIAGNOSIS — N400 Enlarged prostate without lower urinary tract symptoms: Secondary | ICD-10-CM

## 2018-04-28 DIAGNOSIS — R413 Other amnesia: Secondary | ICD-10-CM

## 2018-04-28 DIAGNOSIS — E785 Hyperlipidemia, unspecified: Secondary | ICD-10-CM | POA: Diagnosis not present

## 2018-04-28 DIAGNOSIS — F3342 Major depressive disorder, recurrent, in full remission: Secondary | ICD-10-CM

## 2018-04-28 DIAGNOSIS — Z6827 Body mass index (BMI) 27.0-27.9, adult: Secondary | ICD-10-CM

## 2018-04-28 DIAGNOSIS — E034 Atrophy of thyroid (acquired): Secondary | ICD-10-CM

## 2018-04-28 MED ORDER — CITALOPRAM HYDROBROMIDE 20 MG PO TABS: 20.0000 mg | ORAL_TABLET | Freq: Every day | ORAL | 1 refills | Status: DC

## 2018-04-28 MED ORDER — LEVOTHYROXINE SODIUM 50 MCG PO TABS: 50.0000 ug | ORAL_TABLET | Freq: Every day | ORAL | 1 refills | Status: DC

## 2018-04-28 MED ORDER — OMEPRAZOLE 20 MG PO CPDR: 20.0000 mg | DELAYED_RELEASE_CAPSULE | Freq: Every day | ORAL | 1 refills | Status: DC

## 2018-04-28 MED ORDER — HALOPERIDOL 1 MG PO TABS: 1.5000 mg | ORAL_TABLET | Freq: Two times a day (BID) | ORAL | 1 refills | Status: DC

## 2018-04-28 NOTE — Progress Notes (Signed)
Subjective:    Patient ID: Sergio Baker, male    DOB: Jan 28, 1935, 83 y.o.   MRN: [id number]   Chief Complaint: Medical Management of Chronic Issues   HPI:  1. Gastroesophageal reflux disease, esophagitis presence not specified Is on omeprazole which is working well for him   2. Hypothyroidism due to acquired atrophy of thyroid  Not having any problems that he is aware of  3. Huntington's chorea ([place])  See dr. Jannifer Franklin every 6 months and sooner if needed. He is maintaing right now. Gait is a little unsteady but he uses cane when walking. deneis any recent falls  4. Benign prostatic hyperplasia without lower urinary tract symptoms Sees dr. Jeffie Pollock and he would like PSA drawn today. He denies any urinary issues  5. Abnormality of gait  Due to the huntington. Again no recent falls  6. Memory deficits  Wife says he is maintaining  7. Hyperlipidemia with target LDL less than 100  Wife tries to not fry foods  8. Recurrent major depressive disorder, in full remission ([place])  Is on celexa and is doing ell.  Depression screen [facility] 2/9 04/28/2018 11/29/2017 09/20/2017 06/10/2017 06/07/2017  Decreased Interest 0 0 0 0 0  Down, Depressed, Hopeless 0 0 0 0 0  PHQ - 2 Score 0 0 0 0 0  Altered sleeping - - - - -  Tired, decreased energy - - - - -  Change in appetite - - - - -  Feeling bad or failure about yourself  - - - - -  Trouble concentrating - - - - -  Moving slowly or fidgety/restless - - - - -  Suicidal thoughts - - - - -  PHQ-9 Score - - - - -     9. BMI 27.0-27.9,adult  No recnt weight changes  10. Diverticulosis of large intestine without hemorrhage  Denies any recent flare ups.    Outpatient Encounter Medications as of 04/28/2018  Medication Sig  . acetaminophen (TYLENOL) 650 MG CR tablet Take 1,300 mg by mouth daily as needed for pain.  . Aloe Vera 25 MG CAPS Take 25 mg by mouth daily.  Marland Kitchen aspirin EC 81 MG tablet Take 81 mg by mouth daily.  Marland Kitchen CALCIUM CITRATE PO Take 1 tablet  by mouth daily.  . Cholecalciferol (VITAMIN D3) 2000 UNITS capsule Take 2,000 Units by mouth daily.  . citalopram (CELEXA) 20 MG tablet Take 1 tablet (20 mg total) by mouth daily.  . Glucosamine-Chondroit-Vit C-Mn (GLUCOSAMINE CHONDR 1500 COMPLX PO) Take 1 tablet by mouth daily.  . haloperidol (HALDOL) 1 MG tablet Take 1.5 tablets (1.5 mg total) by mouth 2 (two) times daily.  Marland Kitchen HYDROcodone-acetaminophen (NORCO/VICODIN) 5-325 MG tablet Take 1 tablet by mouth daily as needed for moderate pain.  Marland Kitchen levothyroxine (SYNTHROID, LEVOTHROID) 50 MCG tablet TAKE ONE (1) TABLET EACH DAY  . Methylsulfonylmethane (MSM) 1000 MG CAPS Take 1,000 mg by mouth daily.  . Multiple Vitamin (MULTIVITAMIN WITH MINERALS) TABS Take 1 tablet by mouth daily. For Men 50+  . Omega-3 Fatty Acids (FISH OIL PO) Take 1 tablet by mouth daily.  Marland Kitchen omeprazole (PRILOSEC) 20 MG capsule TAKE ONE (1) CAPSULE EACH DAY    New complaints: None today  Social history: Lives with wife. Has a daughter in nursing home with end stages of huntington's   Review of Systems  Constitutional: Negative for activity change and appetite change.  HENT: Negative.   Eyes: Negative for pain.  Respiratory: Negative for shortness  of breath.   Cardiovascular: Negative for chest pain, palpitations and leg swelling.  Gastrointestinal: Negative for abdominal pain.  Endocrine: Negative for polydipsia.  Genitourinary: Negative.   Skin: Negative for rash.  Neurological: Positive for dizziness and tremors. Negative for weakness and headaches.  Hematological: Does not bruise/bleed easily.  Psychiatric/Behavioral: Negative.   All other systems reviewed and are negative.      Objective:   Physical Exam  Constitutional: He is oriented to person, place, and time. He appears well-developed and well-nourished.  HENT:  Head: Normocephalic.  Nose: Nose normal.  Mouth/Throat: Oropharynx is clear and moist.  Eyes: Pupils are equal, round, and reactive to  light. EOM are normal.  Neck: Normal range of motion and phonation normal. Neck supple. No JVD present. Carotid bruit is not present. No thyroid mass and no thyromegaly present.  Cardiovascular: Normal rate and regular rhythm.  Pulmonary/Chest: Effort normal and breath sounds normal. No respiratory distress.  Abdominal: Soft. Normal appearance, normal aorta and bowel sounds are normal. There is no tenderness.  Musculoskeletal: Normal range of motion.  Shaky gait- walks with cane  Lymphadenopathy:    He has no cervical adenopathy.  Neurological: He is alert and oriented to person, place, and time.  Skin: Skin is warm and dry.  Psychiatric: He has a normal mood and affect. His behavior is normal. Judgment and thought content normal.  Nursing note and vitals reviewed.  BP 131/77   Pulse 74   Temp (!) 96.9 F (36.1 C) (Oral)   Ht _0  (1.676 m)   Wt 164 lb (74.4 kg)   BMI 26.47 kg/m          Assessment & Plan:  Sergio Baker comes in today with chief complaint of Medical Management of Chronic Issues   Diagnosis and orders addressed:  1. Gastroesophageal reflux disease, esophagitis presence not specified Avoid spicy foods Do not eat 2 hours prior to bedtime - omeprazole (PRILOSEC) 20 MG capsule; Take 1 capsule (20 mg total) by mouth daily.  Dispense: 90 capsule; Refill: 1  2. Hypothyroidism due to acquired atrophy of thyroid - levothyroxine (SYNTHROID, LEVOTHROID) 50 MCG tablet; Take 1 tablet (50 mcg total) by mouth daily before breakfast.  Dispense: 90 tablet; Refill: 1  3. Huntington's chorea ([place]) Keep follow up with neurology- DR. Willis - haloperidol (HALDOL) 1 MG tablet; Take 1.5 tablets (1.5 mg total) by mouth 2 (two) times daily.  Dispense: 270 tablet; Refill: 1  4. Benign prostatic hyperplasia without lower urinary tract symptoms wll send PSA results to Dr. Jeffie Pollock - PSA, total and free  5. Abnormality of gait Fall precautions  6. Memory deficits Orient  daily  7. Hyperlipidemia with target LDL less than 100 Low fat diet - CMP14+EGFR - Lipid panel  8. Recurrent major depressive disorder, in full remission ([place]) Stress management - citalopram (CELEXA) 20 MG tablet; Take 1 tablet (20 mg total) by mouth daily.  Dispense: 90 tablet; Refill: 1  9. BMI 27.0-27.9,adult Discussed diet and exercise for person with BMI >25 Will recheck weight in 3-6 months  10. Diverticulosis of large intestine without hemorrhage   Labs pending Health Maintenance reviewed Diet and exercise encouraged  Follow up plan: 3 month   [place], FNP

## 2018-04-28 NOTE — Patient Instructions (Signed)

## 2018-04-29 LAB — LIPID PANEL
CHOL/HDL RATIO: 5.8 ratio — AB (ref 0.0–5.0)
Cholesterol, Total: 202 mg/dL — ABNORMAL HIGH (ref 100–199)
HDL: 35 mg/dL — ABNORMAL LOW (ref >39)
LDL CALC: 142 mg/dL — AB (ref 0–99)
Triglycerides: 123 mg/dL (ref 0–149)
VLDL Cholesterol Cal: 25 mg/dL (ref 5–40)

## 2018-04-29 LAB — CMP14+EGFR
A/G RATIO: 1.9 (ref 1.2–2.2)
ALBUMIN: 4.7 g/dL (ref 3.5–4.7)
ALT: 14 IU/L (ref 0–44)
AST: 24 IU/L (ref 0–40)
Alkaline Phosphatase: 70 IU/L (ref 39–117)
BUN / CREAT RATIO: 15 (ref 10–24)
BUN: 16 mg/dL (ref 8–27)
Bilirubin Total: 0.7 mg/dL (ref 0.0–1.2)
CALCIUM: 9.8 mg/dL (ref 8.6–10.2)
CO2: 23 mmol/L (ref 20–29)
CREATININE: 1.05 mg/dL (ref 0.76–1.27)
Chloride: 98 mmol/L (ref 96–106)
GFR, EST AFRICAN AMERICAN: 77 mL/min/{1.73_m2} (ref >59)
GFR, EST NON AFRICAN AMERICAN: 66 mL/min/{1.73_m2} (ref >59)
GLOBULIN, TOTAL: 2.5 g/dL (ref 1.5–4.5)
Glucose: 106 mg/dL — ABNORMAL HIGH (ref 65–99)
POTASSIUM: 4.9 mmol/L (ref 3.5–5.2)
SODIUM: 140 mmol/L (ref 134–144)
TOTAL PROTEIN: 7.2 g/dL (ref 6.0–8.5)

## 2018-04-29 LAB — PSA, TOTAL AND FREE
PROSTATE SPECIFIC AG, SERUM: 5.5 ng/mL — AB (ref 0.0–4.0)
PSA FREE PCT: 22.4 %
PSA, Free: 1.23 ng/mL

## 2018-05-17 ENCOUNTER — Ambulatory Visit (INDEPENDENT_AMBULATORY_CARE_PROVIDER_SITE_OTHER): Payer: Medicare Other

## 2018-05-17 DIAGNOSIS — Z23 Encounter for immunization: Secondary | ICD-10-CM

## 2018-05-31 DIAGNOSIS — M47816 Spondylosis without myelopathy or radiculopathy, lumbar region: Secondary | ICD-10-CM | POA: Diagnosis not present

## 2018-06-14 ENCOUNTER — Encounter: Payer: Self-pay | Admitting: *Deleted

## 2018-06-14 ENCOUNTER — Ambulatory Visit: Payer: Medicare Other | Admitting: *Deleted

## 2018-06-14 VITALS — BP 147/78 | HR 54 | Ht 64.0 in | Wt 161.0 lb

## 2018-06-14 DIAGNOSIS — Z0000 Encounter for general adult medical examination without abnormal findings: Secondary | ICD-10-CM

## 2018-06-14 NOTE — Patient Instructions (Signed)
  Sergio Baker , Thank you for taking time to come for your Medicare Wellness Visit. I appreciate your ongoing commitment to your health goals. Please review the following plan we discussed and let me know if I can assist you in the future.   These are the goals we discussed: Goals    . Exercise 150 minutes per week (moderate activity)       This is a list of the screening recommended for you and due dates:  Health Maintenance  Topic Date Due  . Tetanus Vaccine  09/14/2018*  . Flu Shot  Completed  . Pneumonia vaccines  Completed  *Topic was postponed. The date shown is not the original due date.

## 2018-06-14 NOTE — Progress Notes (Addendum)
Subjective:   Sergio Baker is a 83 y.o. male who presents for a Medicare Annual Wellness Visit. [name] lives at home with his wife. He is retired.    Review of Systems    Patient reports that his overall health is unchanged compared to last year.  Cardiac Risk Factors include: advanced age (>2mn, >>108women);sedentary lifestyle;male gender  Neuromuscular: Constant movement due to Huntington's   All other systems negative  Current Medications (verified) Outpatient Encounter Medications as of 06/14/2018  Medication Sig  . acetaminophen (TYLENOL) 650 MG CR tablet Take 1,300 mg by mouth daily as needed for pain.  . Aloe Vera 25 MG CAPS Take 25 mg by mouth daily.  .Marland Kitchenaspirin EC 81 MG tablet Take 81 mg by mouth daily.  .Marland KitchenCALCIUM CITRATE PO Take 1 tablet by mouth daily.  . Cholecalciferol (VITAMIN D3) 2000 UNITS capsule Take 2,000 Units by mouth daily.  . citalopram (CELEXA) 20 MG tablet Take 1 tablet (20 mg total) by mouth daily.  . Glucosamine-Chondroit-Vit C-Mn (GLUCOSAMINE CHONDR 1500 COMPLX PO) Take 1 tablet by mouth daily.  . haloperidol (HALDOL) 1 MG tablet Take 1.5 tablets (1.5 mg total) by mouth 2 (two) times daily.  .Marland KitchenHYDROcodone-acetaminophen (NORCO/VICODIN) 5-325 MG tablet Take 1 tablet by mouth daily as needed for moderate pain.  .Marland Kitchenlevothyroxine (SYNTHROID, LEVOTHROID) 50 MCG tablet Take 1 tablet (50 mcg total) by mouth daily before breakfast.  . Methylsulfonylmethane (MSM) 1000 MG CAPS Take 1,000 mg by mouth daily.  . Multiple Vitamin (MULTIVITAMIN WITH MINERALS) TABS Take 1 tablet by mouth daily. For Men 50+  . Omega-3 Fatty Acids (FISH OIL PO) Take 1 tablet by mouth daily.  .Marland Kitchenomeprazole (PRILOSEC) 20 MG capsule Take 1 capsule (20 mg total) by mouth daily.   No facility-administered encounter medications on file as of 06/14/2018.     Allergies (verified) Patient has no known allergies.   History: Past Medical History:  Diagnosis Date  . A-fib ([place]   .  Abnormality of gait   . Arthritis   . Depression   . Diverticula of colon   . Dysrhythmia    AFib  . Enlarged prostate   . GERD (gastroesophageal reflux disease)   . Hearing loss   . Hemorrhoids   . Hiatal hernia    small  . HOH (hard of hearing)   . Huntington's disease ([place] 2010  . Hyperlipidemia   . Hypothyroidism   . Joint pain   . Memory deficits 01/17/2014  . Proctocolitis, ulcerative ([place]    Diagnosed in the 1980s.  . Schatzki's ring   . Ulcerative colitis ([place]    left side   Past Surgical History:  Procedure Laterality Date  . APPENDECTOMY    . BIOPSY  07/19/2017   Procedure: BIOPSY;  Surgeon: RDaneil Dolin MD;  [facility];  Service: Endoscopy;;  colon  . BRAIN SURGERY    . CATARACT EXTRACTION W/PHACO Left 05/07/2017   Procedure: CATARACT EXTRACTION PHACO AND INTRAOCULAR LENS PLACEMENT (IOC);  Surgeon: WBaruch Goldmann MD;  Location: [facility];  Service: Ophthalmology;  Laterality: Left;  CDE: 10.49  . CATARACT EXTRACTION W/PHACO Right 06/04/2017   Procedure: CATARACT EXTRACTION PHACO AND INTRAOCULAR LENS PLACEMENT RIGHT EYE;  Surgeon: WBaruch Goldmann MD;  Location: [facility];  Service: Ophthalmology;  Laterality: Right;  CDE: 7.07  . COLONOSCOPY  02/2010   patchy erythema, minimal granularity in distal rectum, left-sided diverticula, ileal diverticulum.  Bx benign. next TCS 02/2012  .  COLONOSCOPY  03/24/2012   Dr. Gala Romney- inflammatory changes of the rectum and colon consistent with history of U/C. bx= benign colonic mucosa  . COLONOSCOPY N/A 10/05/2014   RMR: abnormal rectal and sigmoid mucosa as described above status post segmental biopsy. active colitis sigmoid colon.  . COLONOSCOPY WITH PROPOFOL N/A 07/19/2017   Procedure: COLONOSCOPY WITH PROPOFOL;  Surgeon: Daneil Dolin, MD;  [facility]: [facility];  Service: Endoscopy;  Laterality: N/A;  7:30am  . cyst removed from urinary bladder    . ESOPHAGOGASTRODUODENOSCOPY  04/2008   schatzki ring, s/p ED, hh  .  LUMBAR FUSION    . SHOULDER ARTHROSCOPY W/ ROTATOR CUFF REPAIR Bilateral   . TONSILLECTOMY     Family History  Problem Relation Age of Onset  . Lung cancer Mother 98       deceased  . Other Brother        Posttraumatic stress disorder  . Cancer Brother        skin cancer  . Cancer Sister        thyroid  . Arthritis Sister   . SIDS Brother   . Huntington's disease Daughter   . Colon cancer Neg Hx    Social History   Socioeconomic History  . Marital status: Married    Spouse name: [name]  . Number of children: 1  . Years of education: 12th  . Highest education level: 12th grade  Occupational History  . Occupation: retired Furniture conservator/restorer  . Occupation: works as Administrator, arts at [employer]  . Financial resource strain: Not hard at all  . Food insecurity:    Worry: Never true    Inability: Never true  . Transportation needs:    Medical: No    Non-medical: No  Tobacco Use  . Smoking status: Former Smoker    Packs/day: 0.25    Years: 8.00    Pack years: 2.00    Types: Cigarettes    Last attempt to quit: 05/03/1973    Years since quitting: 45.1  . Smokeless tobacco: Never Used  . Tobacco comment: Quit 1970's   Substance and Sexual Activity  . Alcohol use: No    Alcohol/week: 0.0 standard drinks  . Drug use: No  . Sexual activity: Not Currently    Birth control/protection: None  Lifestyle  . Physical activity:    Days per week: 0 days    Minutes per session: 0 min  . Stress: Only a little  Relationships  . Social connections:    Talks on phone: More than three times a week    Gets together: More than three times a week    Attends religious service: More than 4 times per year    Active member of club or organization: Yes    Attends meetings of clubs or organizations: More than 4 times per year    Relationship status: Married  Other Topics Concern  . Not on file  Social History Narrative   Patient lives at home with his wife.     Tobacco  Use No.  Clinical Intake:  Pre-visit preparation completed: No  Pain : No/denies pain     Nutritional Status: BMI 25 -29 Overweight(Eats 2 to 3 meals a day. Mostly eats out and they really enjoy eating at [facility]) Diabetes: No  How often do you need to have someone help you when you read instructions, pamphlets, or other written materials from your doctor or pharmacy?: 1 - Never What is the last grade  level you completed in school?: 12     Information entered by :: Colon Flattery, RN  Activities of Daily Living In your present state of health, do you have any difficulty performing the following activities: 06/14/2018 06/14/2018  Hearing? N N  Vision? N (No Data)  Comment - routine eye exams. recent cataract surgery. doing well  Difficulty concentrating or making decisions? N -  Walking or climbing stairs? N -  Dressing or bathing? N -  Doing errands, shopping? N -  Preparing Food and eating ? N -  Using the Toilet? N -  In the past six months, have you accidently leaked urine? N -  Do you have problems with loss of bowel control? N -  Managing your Medications? N -  Managing your Finances? N -  Housekeeping or managing your Housekeeping? N -  Some recent data might be hidden     Exercise Current Exercise Habits: The patient does not participate in regular exercise at present, Exercise limited by: neurologic condition(s)  Depression Screen PHQ 2/9 Scores 04/28/2018 11/29/2017 09/20/2017 06/10/2017  PHQ - 2 Score 0 0 0 0  PHQ- 9 Score - - - -     Fall Risk Fall Risk  04/28/2018 11/29/2017 09/20/2017 06/10/2017 06/07/2017  Falls in the past year? No No No Yes Yes  Number falls in past yr: - - - 2 or more 1  Injury with Fall? - - - No Yes  Comment - - - - bump on his head  Risk for fall due to : - - - Impaired balance/gait -  Follow up - - - Falls prevention discussed -    Safety Is the patient's home free of loose throw rugs in walkways, pet beds, electrical cords, etc?    yes        Handrails on the stairs?   yes      Adequate lighting?   yes  Patient Care Team: Chevis Pretty, FNP as PCP - General (Nurse Practitioner) Daneil Dolin, MD (Gastroenterology)  Hospitalizations, surgeries, and ER visits in previous 12 months No hospitalizations, ER visits, or surgeries this past year.   Objective:    Today's Vitals   06/14/18 1507  BP: (!) 147/78  Pulse: (!) 54  Weight: 161 lb (73 kg)  Height: 5' 4"  (1.626 m)   Body mass index is 27.64 kg/m.  Advanced Directives 06/14/2018 07/19/2017 07/14/2017 06/04/2017 05/07/2017 05/03/2017 02/19/2015  Does Patient Have a Medical Advance Directive? Yes No No No Yes Yes Yes  Type of Paramedic of [place];Living will - - - [place];Living will [place];Living will [place];Living will  Does patient want to make changes to medical advance directive? No - Patient declined - - - - - -  Copy of [place] in Chart? No - copy requested - - - No - copy requested No - copy requested No - copy requested  Would patient like information on creating a medical advance directive? - No - Patient declined No - Patient declined No - Patient declined - - -    Hearing/Vision  No hearing or vision deficits noted during visit.   Cognitive Function: MMSE - Mini Mental State Exam 06/15/2018 06/10/2017 03/23/2017 10/09/2016 10/05/2016  Not completed: Unable to complete - - - -  Orientation to time - 5 5 5 3   Orientation to Place - 5 5 5 5   Registration - 3 3 3  2  Attention/ Calculation - 5 5 5 5   Recall - 2 3 3 1   Language- name 2 objects - 2 2 2 2   Language- repeat - 1 1 1 1   Language- follow 3 step command - 3 3 3 3   Language- read & follow direction - 1 1 1 1   Write a sentence - 1 1 1 1   Copy design - 1 1 1 1   Total score - 29 30 30 25        Normal Cognitive Function Screening: Yes    Immunizations and Health  Maintenance Immunization History  Administered Date(s) Administered  . Influenza, High Dose Seasonal PF 05/12/2017, 05/17/2018  . Influenza,inj,Quad PF,6+ Mos 05/14/2014, 05/16/2015, 05/07/2016  . Pneumococcal Conjugate-13 10/25/2014  . Pneumococcal Polysaccharide-23 05/18/2007   There are no preventive care reminders to display for this patient. Health Maintenance  Topic Date Due  . TETANUS/TDAP  09/14/2018 (Originally 01/26/2018)  . INFLUENZA VACCINE  Completed  . PNA vac Low Risk Adult  Completed        Assessment:   This is a routine wellness examination for Nuel.      Plan:    Goals    . Exercise 150 minutes per week (moderate activity)        Health Maintenance Recommendations: None at this time  Additional Screening Recommendations: Lung: Low Dose CT Chest recommended if Age 30-80 years, 30 pack-year currently smoking OR have quit w/in 15years. Patient does not qualify. Hepatitis C Screening recommended: no  Today's Orders No orders of the defined types were placed in this encounter.   Keep f/u with Chevis Pretty, FNP and any other specialty appointments you may have Continue current medications Move carefully to avoid falls. Use assistive devices like a cane or walker if needed. Aim for at least 150 minutes of moderate activity a week. This can be chair exercises if necessary. Reading or puzzles are a good way to exercise your brain Stay connected with friends and family. Social connections are beneficial to your emotional and mental health.   I have personally reviewed and noted the following in the patient's chart:   . Medical and social history . Use of alcohol, tobacco or illicit drugs  . Current medications and supplements . Functional ability and status . Nutritional status . Physical activity . Advanced directives . List of other physicians . Hospitalizations, surgeries, and ER visits in previous 12 months . Vitals . Screenings to  include cognitive, depression, and falls . Referrals and appointments  In addition, I have reviewed and discussed with patient certain preventive protocols, quality metrics, and best practice recommendations. A written personalized care plan for preventive services as well as general preventive health recommendations were provided to patient.     Chong Sicilian, RN   06/14/2018    I have reviewed and agree with the above AWV documentation.   Mary-Margaret Hassell Done, FNP

## 2018-06-15 ENCOUNTER — Encounter: Payer: Self-pay | Admitting: *Deleted

## 2018-06-16 ENCOUNTER — Encounter: Payer: Self-pay | Admitting: Internal Medicine

## 2018-06-27 ENCOUNTER — Encounter: Payer: Self-pay | Admitting: Internal Medicine

## 2018-07-21 ENCOUNTER — Encounter: Payer: Self-pay | Admitting: *Deleted

## 2018-08-02 ENCOUNTER — Ambulatory Visit: Payer: Medicare Other | Admitting: Nurse Practitioner

## 2018-08-19 ENCOUNTER — Other Ambulatory Visit: Payer: Medicare Other

## 2018-08-19 ENCOUNTER — Other Ambulatory Visit: Payer: Self-pay | Admitting: Nurse Practitioner

## 2018-08-19 DIAGNOSIS — R300 Dysuria: Secondary | ICD-10-CM | POA: Diagnosis not present

## 2018-08-19 LAB — URINALYSIS, COMPLETE
Bilirubin, UA: NEGATIVE
GLUCOSE, UA: NEGATIVE
KETONES UA: NEGATIVE
Nitrite, UA: NEGATIVE
SPEC GRAV UA: 1.02 (ref 1.005–1.030)
Urobilinogen, Ur: 0.2 mg/dL (ref 0.2–1.0)
pH, UA: 7 (ref 5.0–7.5)

## 2018-08-19 LAB — MICROSCOPIC EXAMINATION
Epithelial Cells (non renal): NONE SEEN /hpf (ref 0–10)
WBC, UA: >30 /hpf — ABNORMAL HIGH (ref 0–5)

## 2018-08-19 MED ORDER — CIPROFLOXACIN HCL 500 MG PO TABS: 500.0000 mg | ORAL_TABLET | Freq: Two times a day (BID) | ORAL | 0 refills | Status: DC

## 2018-08-19 NOTE — Progress Notes (Signed)
Urine brought in which showed UTI Meds ordered this encounter  Medications  . ciprofloxacin (CIPRO) 500 MG tablet    Sig: Take 1 tablet (500 mg total) by mouth 2 (two) times daily.    Dispense:  14 tablet    Refill:  0    Order Specific Question:   Supervising Provider    Answer:   Evette Doffing, CAROL L [[id number]]

## 2018-08-20 LAB — URINE CULTURE: ORGANISM ID, BACTERIA: NO GROWTH

## 2018-08-22 ENCOUNTER — Other Ambulatory Visit: Payer: Medicare Other

## 2018-08-22 DIAGNOSIS — E785 Hyperlipidemia, unspecified: Secondary | ICD-10-CM | POA: Diagnosis not present

## 2018-08-22 LAB — CMP14+EGFR
A/G RATIO: 1.6 (ref 1.2–2.2)
ALK PHOS: 73 IU/L (ref 39–117)
ALT: 15 IU/L (ref 0–44)
AST: 24 IU/L (ref 0–40)
Albumin: 4.4 g/dL (ref 3.5–4.7)
BILIRUBIN TOTAL: 1 mg/dL (ref 0.0–1.2)
BUN / CREAT RATIO: 15 (ref 10–24)
BUN: 14 mg/dL (ref 8–27)
CO2: 28 mmol/L (ref 20–29)
CREATININE: 0.91 mg/dL (ref 0.76–1.27)
Calcium: 9.8 mg/dL (ref 8.6–10.2)
Chloride: 100 mmol/L (ref 96–106)
GFR calc Af Amer: 91 mL/min/{1.73_m2} (ref >59)
GFR calc non Af Amer: 79 mL/min/{1.73_m2} (ref >59)
GLOBULIN, TOTAL: 2.7 g/dL (ref 1.5–4.5)
Glucose: 114 mg/dL — ABNORMAL HIGH (ref 65–99)
POTASSIUM: 4.3 mmol/L (ref 3.5–5.2)
SODIUM: 141 mmol/L (ref 134–144)
Total Protein: 7.1 g/dL (ref 6.0–8.5)

## 2018-08-22 LAB — LIPID PANEL
CHOLESTEROL TOTAL: 207 mg/dL — AB (ref 100–199)
Chol/HDL Ratio: 5.3 ratio — ABNORMAL HIGH (ref 0.0–5.0)
HDL: 39 mg/dL — ABNORMAL LOW (ref >39)
LDL CALC: 145 mg/dL — AB (ref 0–99)
Triglycerides: 113 mg/dL (ref 0–149)
VLDL Cholesterol Cal: 23 mg/dL (ref 5–40)

## 2018-08-23 ENCOUNTER — Encounter: Payer: Self-pay | Admitting: Nurse Practitioner

## 2018-08-23 ENCOUNTER — Ambulatory Visit (INDEPENDENT_AMBULATORY_CARE_PROVIDER_SITE_OTHER): Payer: Medicare Other | Admitting: Nurse Practitioner

## 2018-08-23 VITALS — BP 142/82 | HR 73 | Temp 95.8°F | Ht 64.0 in | Wt 163.0 lb

## 2018-08-23 DIAGNOSIS — E785 Hyperlipidemia, unspecified: Secondary | ICD-10-CM | POA: Diagnosis not present

## 2018-08-23 DIAGNOSIS — Z6827 Body mass index (BMI) 27.0-27.9, adult: Secondary | ICD-10-CM

## 2018-08-23 DIAGNOSIS — E034 Atrophy of thyroid (acquired): Secondary | ICD-10-CM

## 2018-08-23 DIAGNOSIS — R413 Other amnesia: Secondary | ICD-10-CM

## 2018-08-23 DIAGNOSIS — N400 Enlarged prostate without lower urinary tract symptoms: Secondary | ICD-10-CM | POA: Diagnosis not present

## 2018-08-23 DIAGNOSIS — R269 Unspecified abnormalities of gait and mobility: Secondary | ICD-10-CM

## 2018-08-23 DIAGNOSIS — K5730 Diverticulosis of large intestine without perforation or abscess without bleeding: Secondary | ICD-10-CM

## 2018-08-23 DIAGNOSIS — K219 Gastro-esophageal reflux disease without esophagitis: Secondary | ICD-10-CM

## 2018-08-23 DIAGNOSIS — G10 Huntington's disease: Secondary | ICD-10-CM | POA: Diagnosis not present

## 2018-08-23 DIAGNOSIS — F3342 Major depressive disorder, recurrent, in full remission: Secondary | ICD-10-CM

## 2018-08-23 MED ORDER — CITALOPRAM HYDROBROMIDE 20 MG PO TABS: 20.0000 mg | ORAL_TABLET | Freq: Every day | ORAL | 1 refills | Status: DC

## 2018-08-23 MED ORDER — OMEPRAZOLE 20 MG PO CPDR: 20.0000 mg | DELAYED_RELEASE_CAPSULE | Freq: Every day | ORAL | 1 refills | Status: DC

## 2018-08-23 MED ORDER — HALOPERIDOL 1 MG PO TABS: 1.5000 mg | ORAL_TABLET | Freq: Two times a day (BID) | ORAL | 1 refills | Status: DC

## 2018-08-23 MED ORDER — LEVOTHYROXINE SODIUM 50 MCG PO TABS: 50.0000 ug | ORAL_TABLET | Freq: Every day | ORAL | 1 refills | Status: DC

## 2018-08-23 NOTE — Patient Instructions (Signed)

## 2018-08-23 NOTE — Progress Notes (Signed)
Subjective:    Patient ID: Sergio Baker, male    DOB: 11-Oct-1934, 84 y.o.   MRN: [id number]   Chief Complaint: medical management of chronic issues  HPI:  1. Gastroesophageal reflux disease, esophagitis presence not specified  Patient is on daily omeprazole. Works well to keep symptoms under control.  2. Hypothyroidism due to acquired atrophy of thyroid  No problems that he is aware of.  3. Huntington's chorea ([place])  Sees DR. Willis. Only see him about every 6 months. He is on halidol which helps.  4. Benign prostatic hyperplasia without lower urinary tract symptoms  Doing well. He had UTI last week but has resolved with antibiotic treatment.  5. Hyperlipidemia with target LDL less than 100  Is not a big eater. Wife tries to avoid lots of fried foods.  6. Diverticulosis of large intestine without hemorrhage  No recent flare ups.  7. Recurrent major depressive disorder, in full remission ([place])  Is on celexa and denie any depression. Depression screen [facility] 2/9 08/23/2018 04/28/2018 11/29/2017  Decreased Interest 0 0 0  Down, Depressed, Hopeless 0 0 0  PHQ - 2 Score 0 0 0  Altered sleeping - - -  Tired, decreased energy - - -  Change in appetite - - -  Feeling bad or failure about yourself  - - -  Trouble concentrating - - -  Moving slowly or fidgety/restless - - -  Suicidal thoughts - - -  PHQ-9 Score - - -     8. BMI 27.0-27.9,adult  No recent weight changes  9. Memory deficits  Wife says that she has not noticed any changes  10. Abnormality of gait  Has fallen a couple of times- but has not got injured    Outpatient Encounter Medications as of 08/23/2018  Medication Sig  . acetaminophen (TYLENOL) 650 MG CR tablet Take 1,300 mg by mouth daily as needed for pain.  . Aloe Vera 25 MG CAPS Take 25 mg by mouth daily.  Marland Kitchen aspirin EC 81 MG tablet Take 81 mg by mouth daily.  Marland Kitchen CALCIUM CITRATE PO Take 1 tablet by mouth daily.  . Cholecalciferol (VITAMIN D3) 2000 UNITS capsule  Take 2,000 Units by mouth daily.  . ciprofloxacin (CIPRO) 500 MG tablet Take 1 tablet (500 mg total) by mouth 2 (two) times daily.  . citalopram (CELEXA) 20 MG tablet Take 1 tablet (20 mg total) by mouth daily.  . Glucosamine-Chondroit-Vit C-Mn (GLUCOSAMINE CHONDR 1500 COMPLX PO) Take 1 tablet by mouth daily.  . haloperidol (HALDOL) 1 MG tablet Take 1.5 tablets (1.5 mg total) by mouth 2 (two) times daily.  Marland Kitchen HYDROcodone-acetaminophen (NORCO/VICODIN) 5-325 MG tablet Take 1 tablet by mouth daily as needed for moderate pain.  Marland Kitchen levothyroxine (SYNTHROID, LEVOTHROID) 50 MCG tablet Take 1 tablet (50 mcg total) by mouth daily before breakfast.  . Methylsulfonylmethane (MSM) 1000 MG CAPS Take 1,000 mg by mouth daily.  . Multiple Vitamin (MULTIVITAMIN WITH MINERALS) TABS Take 1 tablet by mouth daily. For Men 50+  . Omega-3 Fatty Acids (FISH OIL PO) Take 1 tablet by mouth daily.  Marland Kitchen omeprazole (PRILOSEC) 20 MG capsule Take 1 capsule (20 mg total) by mouth daily.       New complaints: None  today  Social history: Wife takes care of him.    Review of Systems  Constitutional: Negative for activity change and appetite change.  HENT: Negative.   Eyes: Negative for pain.  Respiratory: Negative for shortness of breath.  Cardiovascular: Negative for chest pain, palpitations and leg swelling.  Gastrointestinal: Negative for abdominal pain.  Endocrine: Negative for polydipsia.  Genitourinary: Negative.   Musculoskeletal: Positive for gait problem.  Skin: Negative for rash.  Neurological: Negative for dizziness, weakness and headaches.  Hematological: Does not bruise/bleed easily.  Psychiatric/Behavioral: Negative.   All other systems reviewed and are negative.      Objective:   Physical Exam Vitals signs and nursing note reviewed.  Constitutional:      Appearance: Normal appearance. He is well-developed.  HENT:     Head: Normocephalic.     Nose: Nose normal.  Eyes:     Pupils: Pupils  are equal, round, and reactive to light.  Neck:     Musculoskeletal: Normal range of motion and neck supple.     Thyroid: No thyroid mass or thyromegaly.     Vascular: No carotid bruit or JVD.     Trachea: Phonation normal.  Cardiovascular:     Rate and Rhythm: Normal rate and regular rhythm.  Pulmonary:     Effort: Pulmonary effort is normal. No respiratory distress.     Breath sounds: Normal breath sounds.  Abdominal:     General: Bowel sounds are normal.     Palpations: Abdomen is soft.     Tenderness: There is no abdominal tenderness.  Musculoskeletal: Normal range of motion.     Comments: gailt slow and steady today  Lymphadenopathy:     Cervical: No cervical adenopathy.  Skin:    General: Skin is warm and dry.  Neurological:     Mental Status: He is alert and oriented to person, place, and time.     Comments: Jerky movements  Psychiatric:        Behavior: Behavior normal.        Thought Content: Thought content normal.        Judgment: Judgment normal.    BP (!) 142/82 (BP Location: Left Arm, Cuff Size: Normal)   Pulse 73   Temp (!) 95.8 F (35.4 C) (Oral)   Ht 5' 4" (1.626 m)   Wt 163 lb (73.9 kg)   BMI 27.98 kg/m         Assessment & Plan:  Sergio Baker comes in today with chief complaint of Hypothyroidism (3 mo) and Gastroesophageal Reflux   Diagnosis and orders addressed:  1. Gastroesophageal reflux disease, esophagitis presence not specified Avoid spicy foods Do not eat 2 hours prior to bedtime - omeprazole (PRILOSEC) 20 MG capsule; Take 1 capsule (20 mg total) by mouth daily.  Dispense: 90 capsule; Refill: 1  2. Hypothyroidism due to acquired atrophy of thyroid - levothyroxine (SYNTHROID, LEVOTHROID) 50 MCG tablet; Take 1 tablet (50 mcg total) by mouth daily before breakfast.  Dispense: 90 tablet; Refill: 1  3. Huntington's chorea ([place]) Need t o follow up with neurology - haloperidol (HALDOL) 1 MG tablet; Take 1.5 tablets (1.5 mg total) by  mouth 2 (two) times daily.  Dispense: 270 tablet; Refill: 1  4. Benign prostatic hyperplasia without lower urinary tract symptoms Keep yearly check ups with uroogy  5. Hyperlipidemia with target LDL less than 100 Low fat diet - CMP14+EGFR - Lipid panel  6. Diverticulosis of large intestine without hemorrhage  7. Recurrent major depressive disorder, in full remission ([place]) stres management - citalopram (CELEXA) 20 MG tablet; Take 1 tablet (20 mg total) by mouth daily.  Dispense: 90 tablet; Refill: 1  8. BMI 27.0-27.9,adult Discussed diet and exercise for person with BMI >  25 Will recheck weight in 3-6 months  9. Memory deficits Brain exercises daily with word puzzle sor games  10. Abnormality of gait Fall prevention   Labs pending Health Maintenance reviewed Diet and exercise encouraged  Follow up plan: 3 months   [place], FNP

## 2018-09-02 DIAGNOSIS — M47816 Spondylosis without myelopathy or radiculopathy, lumbar region: Secondary | ICD-10-CM | POA: Diagnosis not present

## 2018-09-06 ENCOUNTER — Ambulatory Visit: Payer: Medicare Other | Admitting: Internal Medicine

## 2018-09-13 ENCOUNTER — Encounter: Payer: Self-pay | Admitting: Internal Medicine

## 2018-09-13 ENCOUNTER — Ambulatory Visit: Payer: Medicare Other | Admitting: Internal Medicine

## 2018-09-13 VITALS — BP 166/92 | HR 73 | Temp 96.8°F | Ht 66.0 in | Wt 162.2 lb

## 2018-09-13 DIAGNOSIS — K5100 Ulcerative (chronic) pancolitis without complications: Secondary | ICD-10-CM | POA: Diagnosis not present

## 2018-09-13 DIAGNOSIS — K219 Gastro-esophageal reflux disease without esophagitis: Secondary | ICD-10-CM

## 2018-09-13 NOTE — Progress Notes (Signed)
Primary Care Physician:  Chevis Pretty, FNP Primary Gastroenterologist:  Dr. Gala Romney Pre-Procedure History & Physical: HPI:  Sergio Baker is a 84 y.o. male here for follow-up of longstanding pan ulcerative colitis.  Last colonoscopy demonstrated no evidence of dysplasia.  He is settled in on taking aloe vera tablets daily.  Not interested in more the expensive typical traditional mesalamine products.  He is not having any bleeding or diarrhea.  Reflux symptoms well controlled on omeprazole 20 mg daily.  No dysphagia. As previously discussed, no further surveillance of his longstanding UC warranted.  Blood pressure up a bit today.   Past Medical History:  Diagnosis Date  . A-fib ([place])   . Abnormality of gait   . Arthritis   . Depression   . Diverticula of colon   . Dysrhythmia    AFib  . Enlarged prostate   . GERD (gastroesophageal reflux disease)   . Hearing loss   . Hemorrhoids   . Hiatal hernia    small  . HOH (hard of hearing)   . Huntington's disease ([place]) 2010  . Hyperlipidemia   . Hypothyroidism   . Joint pain   . Memory deficits 01/17/2014  . 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  . Schatzki's ring   . Ulcerative colitis ([place])    left side    Past Surgical History:  Procedure Laterality Date  . APPENDECTOMY    . BIOPSY  07/19/2017   Procedure: BIOPSY;  Surgeon: Daneil Dolin, MD;  Location: [facility];  Service: Endoscopy;;  colon  . BRAIN SURGERY    . CATARACT EXTRACTION W/PHACO Left 05/07/2017   Procedure: CATARACT EXTRACTION PHACO AND INTRAOCULAR LENS PLACEMENT (IOC);  Surgeon: Baruch Goldmann, MD;  Location: [facility];  Service: Ophthalmology;  Laterality: Left;  CDE: 10.49  . CATARACT EXTRACTION W/PHACO Right 06/04/2017   Procedure: CATARACT EXTRACTION PHACO AND INTRAOCULAR LENS PLACEMENT RIGHT EYE;  Surgeon: Baruch Goldmann, MD;  Location: [facility];  Service: Ophthalmology;  Laterality: Right;  CDE: 7.07  . COLONOSCOPY  02/2010     patchy erythema, minimal granularity in distal rectum, left-sided diverticula, ileal diverticulum.  Bx benign. next TCS 02/2012  . COLONOSCOPY  03/24/2012   Dr. Gala Romney- inflammatory changes of the rectum and colon consistent with history of U/C. bx= benign colonic mucosa  . COLONOSCOPY N/A 10/05/2014   RMR: abnormal rectal and sigmoid mucosa as described above status post segmental biopsy. active colitis sigmoid colon.  . COLONOSCOPY WITH PROPOFOL N/A 07/19/2017   Procedure: COLONOSCOPY WITH PROPOFOL;  Surgeon: Daneil Dolin, MD;  Location: [facility];  Service: Endoscopy;  Laterality: N/A;  7:30am  . cyst removed from urinary bladder    . ESOPHAGOGASTRODUODENOSCOPY  04/2008   schatzki ring, s/p ED, hh  . LUMBAR FUSION    . SHOULDER ARTHROSCOPY W/ ROTATOR CUFF REPAIR Bilateral   . TONSILLECTOMY      Prior to Admission medications   Medication Sig Start Date End Date Taking? Authorizing Provider  acetaminophen (TYLENOL) 650 MG CR tablet Take 1,300 mg by mouth daily as needed for pain.   Yes [provider]  Aloe Vera 25 MG CAPS Take 25 mg by mouth daily.   Yes [provider]  aspirin EC 81 MG tablet Take 81 mg by mouth daily.   Yes [provider]  CALCIUM CITRATE PO Take 1 tablet by mouth daily.   Yes [provider]  Cholecalciferol (VITAMIN D3) 2000 UNITS  capsule Take 2,000 Units by mouth daily.   Yes [provider]  citalopram (CELEXA) 20 MG tablet Take 1 tablet (20 mg total) by mouth daily. 08/23/18  Yes Martin, Mary-Margaret, FNP  Glucosamine-Chondroit-Vit C-Mn (GLUCOSAMINE CHONDR 1500 COMPLX PO) Take 1 tablet by mouth daily.   Yes [provider]  haloperidol (HALDOL) 1 MG tablet Take 1.5 tablets (1.5 mg total) by mouth 2 (two) times daily. 08/23/18  Yes Martin, Mary-Margaret, FNP  HYDROcodone-acetaminophen (NORCO/VICODIN) 5-325 MG tablet Take 1 tablet by mouth daily as needed for moderate pain. 06/07/17  Yes Hassell Done, Mary-Margaret,  FNP  levothyroxine (SYNTHROID, LEVOTHROID) 50 MCG tablet Take 1 tablet (50 mcg total) by mouth daily before breakfast. 08/23/18  Yes Hassell Done, Mary-Margaret, FNP  Methylsulfonylmethane (MSM) 1000 MG CAPS Take 1,000 mg by mouth daily.   Yes [provider]  Multiple Vitamin (MULTIVITAMIN WITH MINERALS) TABS Take 1 tablet by mouth daily. For Men 50+   Yes [provider]  Omega-3 Fatty Acids (FISH OIL PO) Take 1 tablet by mouth daily.   Yes [provider]  omeprazole (PRILOSEC) 20 MG capsule Take 1 capsule (20 mg total) by mouth daily. 08/23/18  Yes Hassell Done, Mary-Margaret, FNP  ciprofloxacin (CIPRO) 500 MG tablet Take 1 tablet (500 mg total) by mouth 2 (two) times daily. Patient not taking: Reported on 09/13/2018 08/19/18   Chevis Pretty, FNP    Allergies as of 09/13/2018  . (No Known Allergies)    Family History  Problem Relation Age of Onset  . Lung cancer Mother 66       deceased  . Other Brother        Posttraumatic stress disorder  . Cancer Brother        skin cancer  . Cancer Sister        thyroid  . Arthritis Sister   . SIDS Brother   . Huntington's disease Daughter   . Colon cancer Neg Hx     Social History   Socioeconomic History  . Marital status: Married    Spouse name: [name]  . Number of children: 1  . Years of education: 12th  . Highest education level: 12th grade  Occupational History  . Occupation: retired Furniture conservator/restorer  . Occupation: works as Administrator, arts at [employer]  . Financial resource strain: Not hard at all  . Food insecurity:    Worry: Never true    Inability: Never true  . Transportation needs:    Medical: No    Non-medical: No  Tobacco Use  . Smoking status: Former Smoker    Packs/day: 0.25    Years: 8.00    Pack years: 2.00    Types: Cigarettes    Last attempt to quit: 05/03/1973    Years since quitting: 45.3  . Smokeless tobacco: Never Used  . Tobacco comment: Quit 1970's   Substance and Sexual  Activity  . Alcohol use: No    Alcohol/week: 0.0 standard drinks  . Drug use: No  . Sexual activity: Not Currently    Birth control/protection: None  Lifestyle  . Physical activity:    Days per week: 0 days    Minutes per session: 0 min  . Stress: Only a little  Relationships  . Social connections:    Talks on phone: More than three times a week    Gets together: More than three times a week    Attends religious service: More than 4 times per year    Active member of  club or organization: Yes    Attends meetings of clubs or organizations: More than 4 times per year    Relationship status: Married  . Intimate partner violence:    Fear of current or ex partner: No    Emotionally abused: No    Physically abused: No    Forced sexual activity: No  Other Topics Concern  . Not on file  Social History Narrative   Patient lives at home with his wife.     Review of Systems: See HPI, otherwise negative ROS  Physical Exam: BP (!) 166/92   Pulse 73   Temp (!) 96.8 F (36 C) (Oral)   Ht 5' 6"  (1.676 m)   Wt 162 lb 3.2 oz (73.6 kg)   BMI 26.18 kg/m  General:   Alert,  pleasant and cooperative in NAD Neck:  Supple; no masses or thyromegaly. No significant cervical adenopathy. Lungs:  Clear throughout to auscultation.   No wheezes, crackles, or rhonchi. No acute distress. Heart:  Regular rate and rhythm; no murmurs, clicks, rubs,  or gallops. Abdomen: Non-distended, normal bowel sounds.  Soft and nontender without appreciable mass or hepatosplenomegaly.  Pulses:  Normal pulses noted. Extremities:  Without clubbing or edema.  Impression/Plan: Pleasant 84 year old gentleman longstanding UC clinically, doing well on unconventional therapy in the way of aloe vera daily.  Given he has some advanced age and comorbidities, it has been mutually agreed that further surveillance not warranted as the benefits may not be worth the risks.  Of course, should he have new symptoms develop in the  future plans can always change.  GERD well-controlled on omeprazole 20 mg daily.  Recommendations: Continue Nexium daily indefinitely  May continue Aloe Vera daily ( as discussed, this is unconventional treatment for UC but we will go with it)  As discussed, no more colonoscopys as benefits likely not outweighing risks  Have your BP re-checked later this week  OV 1 year   Notice: This dictation was prepared with Dragon dictation along with smaller phrase technology. Any transcriptional errors that result from this process are unintentional and may not be corrected upon review.Marland Kitchen

## 2018-09-13 NOTE — Patient Instructions (Signed)
Continue Nexium daily indefinitely  May continue Aloe Vera daily ( as discussed, this is unconventional treatment for UC but we will go with it)  As discussed, no more colonoscopys as benefits likely not outweighing risks  Have your BP re-checked later this week  OV 1 year

## 2018-11-22 ENCOUNTER — Encounter: Payer: Self-pay | Admitting: Nurse Practitioner

## 2018-11-22 ENCOUNTER — Ambulatory Visit (INDEPENDENT_AMBULATORY_CARE_PROVIDER_SITE_OTHER): Payer: Medicare Other | Admitting: Nurse Practitioner

## 2018-11-22 ENCOUNTER — Other Ambulatory Visit: Payer: Self-pay

## 2018-11-22 DIAGNOSIS — K219 Gastro-esophageal reflux disease without esophagitis: Secondary | ICD-10-CM | POA: Diagnosis not present

## 2018-11-22 DIAGNOSIS — E785 Hyperlipidemia, unspecified: Secondary | ICD-10-CM | POA: Diagnosis not present

## 2018-11-22 DIAGNOSIS — R413 Other amnesia: Secondary | ICD-10-CM

## 2018-11-22 DIAGNOSIS — F3342 Major depressive disorder, recurrent, in full remission: Secondary | ICD-10-CM

## 2018-11-22 DIAGNOSIS — E034 Atrophy of thyroid (acquired): Secondary | ICD-10-CM | POA: Diagnosis not present

## 2018-11-22 DIAGNOSIS — G10 Huntington's disease: Secondary | ICD-10-CM | POA: Diagnosis not present

## 2018-11-22 DIAGNOSIS — Z6827 Body mass index (BMI) 27.0-27.9, adult: Secondary | ICD-10-CM

## 2018-11-22 MED ORDER — LEVOTHYROXINE SODIUM 50 MCG PO TABS: 50.0000 ug | ORAL_TABLET | Freq: Every day | ORAL | 1 refills | Status: DC

## 2018-11-22 MED ORDER — HALOPERIDOL 1 MG PO TABS: 1.5000 mg | ORAL_TABLET | Freq: Two times a day (BID) | ORAL | 1 refills | Status: DC

## 2018-11-22 MED ORDER — OMEPRAZOLE 20 MG PO CPDR: 20.0000 mg | DELAYED_RELEASE_CAPSULE | Freq: Every day | ORAL | 1 refills | Status: DC

## 2018-11-22 MED ORDER — CITALOPRAM HYDROBROMIDE 20 MG PO TABS: 20.0000 mg | ORAL_TABLET | Freq: Every day | ORAL | 1 refills | Status: DC

## 2018-11-22 NOTE — Progress Notes (Signed)
Patient ID: Sergio Baker, male   DOB: [birth year], 84 y.o.   MRN: [id number]     Virtual Visit via telephone Note  I connected with Sergio Baker on 11/22/18 at 11:00 AM by telephone and verified that I am speaking with the correct person using two identifiers. Sergio Baker is currently located at home and husband  is currently with her during visit. The provider, Sergio Hassell Done, FNP is located in their office at time of visit.  I discussed the limitations, risks, security and privacy concerns of performing an evaluation and management service by telephone and the availability of in person appointments. I also discussed with the patient that there may be a patient responsible charge related to this service. The patient expressed understanding and agreed to proceed.   History and Present Illness:   Chief Complaint: medical management of chronic issues  HPI:  1. Gastroesophageal reflux disease, esophagitis presence not specified  is on omeprazole daily, working well  2. Hypothyroidism due to acquired atrophy of thyroid  No problems that she is aware of  3. Huntington's chorea ([place])  He is doing okay. Gait is a little worse from shaking  4. Memory deficits  Gradually worsening but wife says it is okay  5. Hyperlipidemia with target LDL less than 100  Tries to watch diet. No exercise  6. Recurrent major depressive disorder, in full remission ([place])  Is on celexa daily and works well to keep hi from worrying so much Depression screen [facility] 2/9 11/22/2018 08/23/2018 04/28/2018  Decreased Interest 0 0 0  Down, Depressed, Hopeless 0 0 0  PHQ - 2 Score 0 0 0  Altered sleeping - - -  Tired, decreased energy - - -  Change in appetite - - -  Feeling bad or failure about yourself  - - -  Trouble concentrating - - -  Moving slowly or fidgety/restless - - -  Suicidal thoughts - - -  PHQ-9 Score - - -     7. BMI 27.0-27.9,adult  No weight changes    Outpatient Encounter  Medications as of 11/22/2018  Medication Sig  . acetaminophen (TYLENOL) 650 MG CR tablet Take 1,300 mg by mouth daily as needed for pain.  . Aloe Vera 25 MG CAPS Take 25 mg by mouth daily.  Marland Kitchen aspirin EC 81 MG tablet Take 81 mg by mouth daily.  Marland Kitchen CALCIUM CITRATE PO Take 1 tablet by mouth daily.  . Cholecalciferol (VITAMIN D3) 2000 UNITS capsule Take 2,000 Units by mouth daily.  . ciprofloxacin (CIPRO) 500 MG tablet Take 1 tablet (500 mg total) by mouth 2 (two) times daily. (Patient not taking: Reported on 09/13/2018)  . citalopram (CELEXA) 20 MG tablet Take 1 tablet (20 mg total) by mouth daily.  . Glucosamine-Chondroit-Vit C-Mn (GLUCOSAMINE CHONDR 1500 COMPLX PO) Take 1 tablet by mouth daily.  . haloperidol (HALDOL) 1 MG tablet Take 1.5 tablets (1.5 mg total) by mouth 2 (two) times daily.  Marland Kitchen HYDROcodone-acetaminophen (NORCO/VICODIN) 5-325 MG tablet Take 1 tablet by mouth daily as needed for moderate pain.  Marland Kitchen levothyroxine (SYNTHROID, LEVOTHROID) 50 MCG tablet Take 1 tablet (50 mcg total) by mouth daily before breakfast.  . Methylsulfonylmethane (MSM) 1000 MG CAPS Take 1,000 mg by mouth daily.  . Multiple Vitamin (MULTIVITAMIN WITH MINERALS) TABS Take 1 tablet by mouth daily. For Men 50+  . Omega-3 Fatty Acids (FISH OIL PO) Take 1 tablet by mouth daily.  Marland Kitchen omeprazole (PRILOSEC) 20 MG capsule Take 1 capsule (  20 mg total) by mouth daily.      New complaints: None today  Social history: Lives with his wife who is his primary caregiver.  Review of Systems  Constitutional: Negative for diaphoresis and weight loss.  Eyes: Negative for blurred vision, double vision and pain.  Respiratory: Negative for shortness of breath.   Cardiovascular: Negative for chest pain, palpitations, orthopnea and leg swelling.  Gastrointestinal: Negative for abdominal pain.  Musculoskeletal:       Unsteady gait  Skin: Negative for rash.  Neurological: Negative for dizziness, sensory change, loss of consciousness,  weakness and headaches.  Endo/Heme/Allergies: Negative for polydipsia. Does not bruise/bleed easily.  Psychiatric/Behavioral: Negative for memory loss. The patient does not have insomnia.   All other systems reviewed and are negative.      Observations/Objective: Alert and oriented- answers all questions appropriately  Assessment and Plan: Sergio Baker comes in today with chief complaint of medical management of medical issues  Diagnosis and orders addressed:  1. Gastroesophageal reflux disease, esophagitis presence not specified Avoid spicy foods Do not eat 2 hours prior to bedtime - omeprazole (PRILOSEC) 20 MG capsule; Take 1 capsule (20 mg total) by mouth daily.  Dispense: 90 capsule; Refill: 1  2. Hypothyroidism due to acquired atrophy of thyroid - levothyroxine (SYNTHROID, LEVOTHROID) 50 MCG tablet; Take 1 tablet (50 mcg total) by mouth daily before breakfast.  Dispense: 90 tablet; Refill: 1  3. Huntington's chorea ([place]) Keep follow up with neurology Fall precautions - haloperidol (HALDOL) 1 MG tablet; Take 1.5 tablets (1.5 mg total) by mouth 2 (two) times daily.  Dispense: 270 tablet; Refill: 1  4. Memory deficits [place] daily  5. Hyperlipidemia with target LDL less than 100 Low fat diet  6. Recurrent major depressive disorder, in full remission ([place]) Stress management - citalopram (CELEXA) 20 MG tablet; Take 1 tablet (20 mg total) by mouth daily.  Dispense: 90 tablet; Refill: 1  7. BMI 27.0-27.9,adult Discussed diet and exercise for person with BMI >25 Will recheck weight in 3-6 months   Previous labs reviewed Health Maintenance reviewed Diet and exercise encouraged  Follow up plan: 3 months    I discussed the assessment and treatment plan with the patient. The patient was provided an opportunity to ask questions and all were answered. The patient agreed with the plan and demonstrated an understanding of the instructions.   The patient was advised to  call back or seek an in-person evaluation if the symptoms worsen or if the condition fails to improve as anticipated.  The above assessment and management plan was discussed with the patient. The patient verbalized understanding of and has agreed to the management plan. Patient is aware to call the clinic if symptoms persist or worsen. Patient is aware when to return to the clinic for a follow-up visit. Patient educated on when it is appropriate to go to the emergency department.    I provided 15 minutes of non-face-to-face time during this encounter.    Sergio Hassell Done, FNP

## 2018-12-15 ENCOUNTER — Encounter: Payer: Self-pay | Admitting: Family Medicine

## 2018-12-15 ENCOUNTER — Ambulatory Visit (INDEPENDENT_AMBULATORY_CARE_PROVIDER_SITE_OTHER): Payer: Medicare Other | Admitting: Family Medicine

## 2018-12-15 ENCOUNTER — Other Ambulatory Visit: Payer: Self-pay

## 2018-12-15 VITALS — BP 153/87 | HR 70 | Temp 98.5°F | Ht 66.0 in | Wt 160.6 lb

## 2018-12-15 DIAGNOSIS — M1990 Unspecified osteoarthritis, unspecified site: Secondary | ICD-10-CM

## 2018-12-15 MED ORDER — PREDNISONE 20 MG PO TABS: ORAL_TABLET | ORAL | 0 refills | Status: DC

## 2018-12-15 NOTE — Progress Notes (Signed)
BP (!) 153/87   Pulse 70   Temp 98.5 F (36.9 C) (Oral)   Ht 5' 6"  (1.676 m)   Wt 160 lb 9.6 oz (72.8 kg)   BMI 25.92 kg/m    Subjective:   Patient ID: Sergio Baker, male    DOB: Feb 23, 1935, 84 y.o.   MRN: [id number]  HPI: Sergio Baker is a 84 y.o. male presenting on 12/15/2018 for Wrist Pain (right x 1 day)   HPI Right wrist pain and swelling Patient comes in today complaining of right wrist pain and swelling that is been going on since yesterday morning.  He says he awoke with it like this in he denies any trauma or event that brought it on.  Patient denies any fevers or chills but does feel like it is very swollen and maybe even bruised and slightly discolored.  He denies any loss of range of motion but it hurts in all range of motion in that wrist.  Patient denies any weakness or numbness.  Relevant past medical, surgical, family and social history reviewed and updated as indicated. Interim medical history since our last visit reviewed. Allergies and medications reviewed and updated.  Review of Systems  Constitutional: Negative for chills and fever.  Respiratory: Negative for shortness of breath and wheezing.   Cardiovascular: Negative for chest pain and leg swelling.  Musculoskeletal: Positive for arthralgias and joint swelling. Negative for back pain and gait problem.  Skin: Negative for rash.  All other systems reviewed and are negative.   Per HPI unless specifically indicated above      Objective:   BP (!) 153/87   Pulse 70   Temp 98.5 F (36.9 C) (Oral)   Ht 5' 6"  (1.676 m)   Wt 160 lb 9.6 oz (72.8 kg)   BMI 25.92 kg/m   Wt Readings from Last 3 Encounters:  12/15/18 160 lb 9.6 oz (72.8 kg)  09/13/18 162 lb 3.2 oz (73.6 kg)  08/23/18 163 lb (73.9 kg)    Physical Exam Vitals signs and nursing note reviewed.  Constitutional:      General: He is not in acute distress.    Appearance: He is well-developed. He is not diaphoretic.  Neck:   Thyroid: No thyromegaly.  Cardiovascular:     Rate and Rhythm: Normal rate and regular rhythm.     Heart sounds: Normal heart sounds. No murmur.  Pulmonary:     Effort: Pulmonary effort is normal. No respiratory distress.     Breath sounds: Normal breath sounds. No wheezing.  Musculoskeletal: Normal range of motion.     Right wrist: He exhibits tenderness (Anterior wrist tenderness and swelling over the right hand.  Mild bruising as well and some swelling going into the palm of his hand as well.). He exhibits normal range of motion, no bony tenderness, no swelling and no deformity.  Skin:    General: Skin is warm and dry.     Findings: No erythema.  Neurological:     Mental Status: He is alert.       Assessment & Plan:   Problem List Items Addressed This Visit    None    Visit Diagnoses    Inflammatory arthritis    -  Primary   Relevant Medications   predniSONE (DELTASONE) 20 MG tablet   Other Relevant Orders   Arthritis Panel       Follow up plan: Return if symptoms worsen or fail to improve.  Counseling provided for all  of the vaccine components Orders Placed This Encounter  Procedures  . Arthritis Panel    Caryl Pina, MD [facility] 12/15/2018, 2:41 PM

## 2018-12-16 LAB — ARTHRITIS PANEL
Basophils Absolute: 0 10*3/uL (ref 0.0–0.2)
Basos: 0 %
EOS (ABSOLUTE): 0.2 10*3/uL (ref 0.0–0.4)
Eos: 2 %
Hematocrit: 46.7 % (ref 37.5–51.0)
Hemoglobin: 16.3 g/dL (ref 13.0–17.7)
Immature Grans (Abs): 0 10*3/uL (ref 0.0–0.1)
Immature Granulocytes: 0 %
Lymphocytes Absolute: 1.9 10*3/uL (ref 0.7–3.1)
Lymphs: 20 %
MCH: 33.5 pg — ABNORMAL HIGH (ref 26.6–33.0)
MCHC: 34.9 g/dL (ref 31.5–35.7)
MCV: 96 fL (ref 79–97)
Monocytes Absolute: 0.9 10*3/uL (ref 0.1–0.9)
Monocytes: 9 %
Neutrophils Absolute: 6.8 10*3/uL (ref 1.4–7.0)
Neutrophils: 69 %
Platelets: 200 10*3/uL (ref 150–450)
RBC: 4.86 x10E6/uL (ref 4.14–5.80)
RDW: 12.8 % (ref 11.6–15.4)
Rhuematoid fact SerPl-aCnc: <10 IU/mL (ref 0.0–13.9)
Sed Rate: 16 mm/hr (ref 0–30)
Uric Acid: 4.9 mg/dL (ref 3.7–8.6)
WBC: 9.9 10*3/uL (ref 3.4–10.8)

## 2018-12-28 ENCOUNTER — Other Ambulatory Visit: Payer: Self-pay | Admitting: *Deleted

## 2019-02-13 DIAGNOSIS — L218 Other seborrheic dermatitis: Secondary | ICD-10-CM | POA: Diagnosis not present

## 2019-02-13 DIAGNOSIS — L821 Other seborrheic keratosis: Secondary | ICD-10-CM | POA: Diagnosis not present

## 2019-02-20 ENCOUNTER — Other Ambulatory Visit: Payer: Medicare Other

## 2019-02-20 ENCOUNTER — Other Ambulatory Visit: Payer: Self-pay

## 2019-02-20 DIAGNOSIS — E034 Atrophy of thyroid (acquired): Secondary | ICD-10-CM

## 2019-02-20 DIAGNOSIS — E785 Hyperlipidemia, unspecified: Secondary | ICD-10-CM

## 2019-02-21 ENCOUNTER — Ambulatory Visit (INDEPENDENT_AMBULATORY_CARE_PROVIDER_SITE_OTHER): Payer: Medicare Other | Admitting: Nurse Practitioner

## 2019-02-21 ENCOUNTER — Encounter: Payer: Self-pay | Admitting: Nurse Practitioner

## 2019-02-21 VITALS — BP 141/80 | HR 70 | Temp 97.7°F | Ht 66.0 in | Wt 161.0 lb

## 2019-02-21 DIAGNOSIS — E034 Atrophy of thyroid (acquired): Secondary | ICD-10-CM

## 2019-02-21 DIAGNOSIS — R413 Other amnesia: Secondary | ICD-10-CM | POA: Diagnosis not present

## 2019-02-21 DIAGNOSIS — K5730 Diverticulosis of large intestine without perforation or abscess without bleeding: Secondary | ICD-10-CM | POA: Diagnosis not present

## 2019-02-21 DIAGNOSIS — F3342 Major depressive disorder, recurrent, in full remission: Secondary | ICD-10-CM

## 2019-02-21 DIAGNOSIS — Z6827 Body mass index (BMI) 27.0-27.9, adult: Secondary | ICD-10-CM

## 2019-02-21 DIAGNOSIS — E785 Hyperlipidemia, unspecified: Secondary | ICD-10-CM | POA: Diagnosis not present

## 2019-02-21 DIAGNOSIS — R269 Unspecified abnormalities of gait and mobility: Secondary | ICD-10-CM

## 2019-02-21 DIAGNOSIS — G10 Huntington's disease: Secondary | ICD-10-CM

## 2019-02-21 DIAGNOSIS — K219 Gastro-esophageal reflux disease without esophagitis: Secondary | ICD-10-CM

## 2019-02-21 LAB — LIPID PANEL
Chol/HDL Ratio: 5.4 ratio — ABNORMAL HIGH (ref 0.0–5.0)
Cholesterol, Total: 196 mg/dL (ref 100–199)
HDL: 36 mg/dL — ABNORMAL LOW (ref >39)
LDL Calculated: 134 mg/dL — ABNORMAL HIGH (ref 0–99)
Triglycerides: 130 mg/dL (ref 0–149)
VLDL Cholesterol Cal: 26 mg/dL (ref 5–40)

## 2019-02-21 LAB — CMP14+EGFR
ALT: 13 IU/L (ref 0–44)
AST: 26 IU/L (ref 0–40)
Albumin/Globulin Ratio: 1.7 (ref 1.2–2.2)
Albumin: 4.7 g/dL — ABNORMAL HIGH (ref 3.6–4.6)
Alkaline Phosphatase: 64 IU/L (ref 39–117)
BUN/Creatinine Ratio: 15 (ref 10–24)
BUN: 13 mg/dL (ref 8–27)
Bilirubin Total: 0.8 mg/dL (ref 0.0–1.2)
CO2: 23 mmol/L (ref 20–29)
Calcium: 9.4 mg/dL (ref 8.6–10.2)
Chloride: 99 mmol/L (ref 96–106)
Creatinine, Ser: 0.85 mg/dL (ref 0.76–1.27)
GFR calc Af Amer: 94 mL/min/{1.73_m2} (ref >59)
GFR calc non Af Amer: 81 mL/min/{1.73_m2} (ref >59)
Globulin, Total: 2.8 g/dL (ref 1.5–4.5)
Glucose: 99 mg/dL (ref 65–99)
Potassium: 4.1 mmol/L (ref 3.5–5.2)
Sodium: 141 mmol/L (ref 134–144)
Total Protein: 7.5 g/dL (ref 6.0–8.5)

## 2019-02-21 LAB — THYROID PANEL WITH TSH
Free Thyroxine Index: 1.9 (ref 1.2–4.9)
T3 Uptake Ratio: 29 % (ref 24–39)
T4, Total: 6.5 ug/dL (ref 4.5–12.0)
TSH: 2.7 u[IU]/mL (ref 0.450–4.500)

## 2019-02-21 MED ORDER — HALOPERIDOL 1 MG PO TABS: 1.5000 mg | ORAL_TABLET | Freq: Two times a day (BID) | ORAL | 1 refills | Status: DC

## 2019-02-21 MED ORDER — OMEPRAZOLE 20 MG PO CPDR: 20.0000 mg | DELAYED_RELEASE_CAPSULE | Freq: Every day | ORAL | 1 refills | Status: DC

## 2019-02-21 MED ORDER — CITALOPRAM HYDROBROMIDE 20 MG PO TABS: 20.0000 mg | ORAL_TABLET | Freq: Every day | ORAL | 1 refills | Status: DC

## 2019-02-21 MED ORDER — LEVOTHYROXINE SODIUM 50 MCG PO TABS: 50.0000 ug | ORAL_TABLET | Freq: Every day | ORAL | 1 refills | Status: DC

## 2019-02-21 NOTE — Patient Instructions (Signed)

## 2019-02-21 NOTE — Progress Notes (Signed)
Subjective:    Patient ID: Sergio Baker, male    DOB: 1934-11-24, 84 y.o.   MRN: [id number]   Chief Complaint: medical mangement of chronic issues   HPI:  1. Hyperlipidemia with target LDL less than 100 Watches diet. Does not do many exercises  2. Gastroesophageal reflux disease, esophagitis presence not specified Takes omeprazole daily and works well for him.  3. Hypothyroidism due to acquired atrophy of thyroid No problems that he is aware of.  4. Memory deficits Wife says he is maintaining. Just repeats hisself on occasion.  5. Diverticulosis of large intestine without hemorrhage No recent flare ups  6. Huntington's chorea ([place]) See neuroloy every 3-6 months. Is on haldol and is doing well.  Still has jerky movements but says he is doing well.   7. Abnormality of gait Due to huntington's. He denies any recent falls. Uses a cane to help with balance.  8. Recurrent major depressive disorder, in full remission ([place]) Is on celexa and is doing well. Depression screen [facility] 2/9 02/21/2019 11/22/2018 08/23/2018  Decreased Interest 0 0 0  Down, Depressed, Hopeless 0 0 0  PHQ - 2 Score 0 0 0  Altered sleeping - - -  Tired, decreased energy - - -  Change in appetite - - -  Feeling bad or failure about yourself  - - -  Trouble concentrating - - -  Moving slowly or fidgety/restless - - -  Suicidal thoughts - - -  PHQ-9 Score - - -    9. BMI 27.0-27.9,adult No recent weight changes    Outpatient Encounter Medications as of 02/21/2019  Medication Sig  . acetaminophen (TYLENOL) 650 MG CR tablet Take 1,300 mg by mouth daily as needed for pain.  . Aloe Vera 25 MG CAPS Take 25 mg by mouth daily.  Marland Kitchen aspirin EC 81 MG tablet Take 81 mg by mouth daily.  Marland Kitchen CALCIUM CITRATE PO Take 1 tablet by mouth daily.  . Cholecalciferol (VITAMIN D3) 2000 UNITS capsule Take 2,000 Units by mouth daily.  . citalopram (CELEXA) 20 MG tablet Take 1 tablet (20 mg total) by mouth daily.  .  Glucosamine-Chondroit-Vit C-Mn (GLUCOSAMINE CHONDR 1500 COMPLX PO) Take 1 tablet by mouth daily.  . haloperidol (HALDOL) 1 MG tablet Take 1.5 tablets (1.5 mg total) by mouth 2 (two) times daily.  Marland Kitchen levothyroxine (SYNTHROID, LEVOTHROID) 50 MCG tablet Take 1 tablet (50 mcg total) by mouth daily before breakfast.  . Methylsulfonylmethane (MSM) 1000 MG CAPS Take 1,000 mg by mouth daily.  . Multiple Vitamin (MULTIVITAMIN WITH MINERALS) TABS Take 1 tablet by mouth daily. For Men 50+  . Omega-3 Fatty Acids (FISH OIL PO) Take 1 tablet by mouth daily.  Marland Kitchen omeprazole (PRILOSEC) 20 MG capsule Take 1 capsule (20 mg total) by mouth daily.  . predniSONE (DELTASONE) 20 MG tablet 2 po at same time daily for 5 days     Past Surgical History:  Procedure Laterality Date  . APPENDECTOMY    . BIOPSY  07/19/2017   Procedure: BIOPSY;  Surgeon: Daneil Dolin, MD;  [facility];  Service: Endoscopy;;  colon  . BRAIN SURGERY    . CATARACT EXTRACTION W/PHACO Left 05/07/2017   Procedure: CATARACT EXTRACTION PHACO AND INTRAOCULAR LENS PLACEMENT (IOC);  Surgeon: Baruch Goldmann, MD;  Location: [facility];  Service: Ophthalmology;  Laterality: Left;  CDE: 10.49  . CATARACT EXTRACTION W/PHACO Right 06/04/2017   Procedure: CATARACT EXTRACTION PHACO AND INTRAOCULAR LENS PLACEMENT RIGHT EYE;  Surgeon: Baruch Goldmann, MD;  Location: [facility];  Service: Ophthalmology;  Laterality: Right;  CDE: 7.07  . COLONOSCOPY  02/2010   patchy erythema, minimal granularity in distal rectum, left-sided diverticula, ileal diverticulum.  Bx benign. next TCS 02/2012  . COLONOSCOPY  03/24/2012   Dr. Gala Romney- inflammatory changes of the rectum and colon consistent with history of U/C. bx= benign colonic mucosa  . COLONOSCOPY N/A 10/05/2014   RMR: abnormal rectal and sigmoid mucosa as described above status post segmental biopsy. active colitis sigmoid colon.  . COLONOSCOPY WITH PROPOFOL N/A 07/19/2017   Procedure: COLONOSCOPY WITH PROPOFOL;   Surgeon: Daneil Dolin, MD;  [facility];  Service: Endoscopy;  Laterality: N/A;  7:30am  . cyst removed from urinary bladder    . ESOPHAGOGASTRODUODENOSCOPY  04/2008   schatzki ring, s/p ED, hh  . LUMBAR FUSION    . SHOULDER ARTHROSCOPY W/ ROTATOR CUFF REPAIR Bilateral   . TONSILLECTOMY      Family History  Problem Relation Age of Onset  . Lung cancer Mother 80       deceased  . Other Brother        Posttraumatic stress disorder  . Cancer Brother        skin cancer  . Cancer Sister        thyroid  . Arthritis Sister   . SIDS Brother   . Huntington's disease Daughter   . Colon cancer Neg Hx     New complaints: None today  Social history: Lives with wife who is his main caregiver  Controlled substance contract: N/A    Review of Systems  Constitutional: Negative for activity change and appetite change.  HENT: Negative.   Eyes: Negative for pain.  Respiratory: Negative for shortness of breath.   Cardiovascular: Negative for chest pain, palpitations and leg swelling.  Gastrointestinal: Negative for abdominal pain.  Endocrine: Negative for polydipsia.  Genitourinary: Negative.   Skin: Negative for rash.  Neurological: Negative for dizziness, weakness and headaches.  Hematological: Does not bruise/bleed easily.  Psychiatric/Behavioral: Negative.   All other systems reviewed and are negative.      Objective:   Physical Exam Vitals signs and nursing note reviewed.  Constitutional:      Appearance: Normal appearance. He is well-developed.  HENT:     Head: Normocephalic.     Nose: Nose normal.  Eyes:     Pupils: Pupils are equal, round, and reactive to light.  Neck:     Musculoskeletal: Normal range of motion and neck supple.     Thyroid: No thyroid mass or thyromegaly.     Vascular: No carotid bruit or JVD.     Trachea: Phonation normal.  Cardiovascular:     Rate and Rhythm: Normal rate and regular rhythm.  Pulmonary:     Effort: Pulmonary  effort is normal. No respiratory distress.     Breath sounds: Normal breath sounds.  Abdominal:     General: Bowel sounds are normal.     Palpations: Abdomen is soft.     Tenderness: There is no abdominal tenderness.  Musculoskeletal: Normal range of motion.  Lymphadenopathy:     Cervical: No cervical adenopathy.  Skin:    General: Skin is warm and dry.  Neurological:     Mental Status: He is alert and oriented to person, place, and time.     Comments: Jerky movements  Psychiatric:        Behavior: Behavior normal.  Thought Content: Thought content normal.        Judgment: Judgment normal.     BP (!) 141/80   Pulse 70   Temp 97.7 F (36.5 C) (Oral)   Ht 5' 6"  (1.676 m)   Wt 161 lb (73 kg)   BMI 25.99 kg/m        Assessment & Plan:  LASEAN GORNIAK comes in today with chief complaint of Medical Management of Chronic Issues   Diagnosis and orders addressed:  1. Hyperlipidemia with target LDL less than 100 Low fat diet  2. Gastroesophageal reflux disease, esophagitis presence not specified Avoid spicy foods Do not eat 2 hours prior to bedtime - omeprazole (PRILOSEC) 20 MG capsule; Take 1 capsule (20 mg total) by mouth daily.  Dispense: 90 capsule; Refill: 1  3. Hypothyroidism due to acquired atrophy of thyroid - levothyroxine (SYNTHROID) 50 MCG tablet; Take 1 tablet (50 mcg total) by mouth daily before breakfast.  Dispense: 90 tablet; Refill: 1  4. Memory deficits Talk to him daily   5. Diverticulosis of large intestine without hemorrhage Watch foods in diet with seeds an undigestible skin  6. Huntington's chorea ([place]) Keep follow up with neurology - haloperidol (HALDOL) 1 MG tablet; Take 1.5 tablets (1.5 mg total) by mouth 2 (two) times daily.  Dispense: 270 tablet; Refill: 1  7. Abnormality of gait Fall prevention  8. Recurrent major depressive disorder, in full remission ([place]) Stress management - citalopram (CELEXA) 20 MG tablet; Take 1 tablet  (20 mg total) by mouth daily.  Dispense: 90 tablet; Refill: 1  9. BMI 27.0-27.9,adult Discussed diet and exercise for person with BMI >25 Will recheck weight in 3-6 months   Labs reviewed Health Maintenance reviewed Diet and exercise encouraged  Follow up plan: 3 months   Mary-Margaret Hassell Done, FNP

## 2019-04-27 ENCOUNTER — Other Ambulatory Visit: Payer: Self-pay

## 2019-04-28 ENCOUNTER — Ambulatory Visit (INDEPENDENT_AMBULATORY_CARE_PROVIDER_SITE_OTHER): Payer: Medicare Other

## 2019-04-28 DIAGNOSIS — Z23 Encounter for immunization: Secondary | ICD-10-CM

## 2019-05-08 ENCOUNTER — Other Ambulatory Visit: Payer: Self-pay | Admitting: Nurse Practitioner

## 2019-05-08 ENCOUNTER — Other Ambulatory Visit: Payer: Self-pay

## 2019-05-08 ENCOUNTER — Other Ambulatory Visit: Payer: Medicare Other

## 2019-05-08 DIAGNOSIS — Z125 Encounter for screening for malignant neoplasm of prostate: Secondary | ICD-10-CM

## 2019-05-08 DIAGNOSIS — E034 Atrophy of thyroid (acquired): Secondary | ICD-10-CM

## 2019-05-08 DIAGNOSIS — E785 Hyperlipidemia, unspecified: Secondary | ICD-10-CM

## 2019-05-09 LAB — THYROID PANEL WITH TSH
Free Thyroxine Index: 2.1 (ref 1.2–4.9)
T3 Uptake Ratio: 31 % (ref 24–39)
T4, Total: 6.9 ug/dL (ref 4.5–12.0)
TSH: 3.68 u[IU]/mL (ref 0.450–4.500)

## 2019-05-09 LAB — PSA, TOTAL AND FREE
PSA, Free Pct: 26.8 %
PSA, Free: 1.42 ng/mL
Prostate Specific Ag, Serum: 5.3 ng/mL — ABNORMAL HIGH (ref 0.0–4.0)

## 2019-05-09 LAB — CMP14+EGFR
ALT: 16 IU/L (ref 0–44)
AST: 28 IU/L (ref 0–40)
Albumin/Globulin Ratio: 1.4 (ref 1.2–2.2)
Albumin: 4.3 g/dL (ref 3.6–4.6)
Alkaline Phosphatase: 70 IU/L (ref 39–117)
BUN/Creatinine Ratio: 21 (ref 10–24)
BUN: 16 mg/dL (ref 8–27)
Bilirubin Total: 0.6 mg/dL (ref 0.0–1.2)
CO2: 24 mmol/L (ref 20–29)
Calcium: 9.2 mg/dL (ref 8.6–10.2)
Chloride: 100 mmol/L (ref 96–106)
Creatinine, Ser: 0.76 mg/dL (ref 0.76–1.27)
GFR calc Af Amer: 98 mL/min/{1.73_m2} (ref >59)
GFR calc non Af Amer: 85 mL/min/{1.73_m2} (ref >59)
Globulin, Total: 3 g/dL (ref 1.5–4.5)
Glucose: 104 mg/dL — ABNORMAL HIGH (ref 65–99)
Potassium: 4 mmol/L (ref 3.5–5.2)
Sodium: 142 mmol/L (ref 134–144)
Total Protein: 7.3 g/dL (ref 6.0–8.5)

## 2019-05-09 LAB — LIPID PANEL
Chol/HDL Ratio: 5.2 ratio — ABNORMAL HIGH (ref 0.0–5.0)
Cholesterol, Total: 194 mg/dL (ref 100–199)
HDL: 37 mg/dL — ABNORMAL LOW (ref >39)
LDL Chol Calc (NIH): 137 mg/dL — ABNORMAL HIGH (ref 0–99)
Triglycerides: 110 mg/dL (ref 0–149)
VLDL Cholesterol Cal: 20 mg/dL (ref 5–40)

## 2019-05-16 ENCOUNTER — Ambulatory Visit (INDEPENDENT_AMBULATORY_CARE_PROVIDER_SITE_OTHER): Payer: Medicare Other | Admitting: Nurse Practitioner

## 2019-05-16 ENCOUNTER — Encounter: Payer: Self-pay | Admitting: Nurse Practitioner

## 2019-05-16 ENCOUNTER — Other Ambulatory Visit: Payer: Self-pay

## 2019-05-16 DIAGNOSIS — I10 Essential (primary) hypertension: Secondary | ICD-10-CM | POA: Diagnosis not present

## 2019-05-16 MED ORDER — LISINOPRIL-HYDROCHLOROTHIAZIDE 20-12.5 MG PO TABS: 1.0000 | ORAL_TABLET | Freq: Every day | ORAL | 1 refills | Status: DC

## 2019-05-16 NOTE — Progress Notes (Signed)
   Virtual Visit via telephone Note Due to COVID-19 pandemic this visit was conducted virtually. This visit type was conducted due to national recommendations for restrictions regarding the COVID-19 Pandemic (e.g. social distancing, sheltering in place) in an effort to limit this patient's exposure and mitigate transmission in our community. All issues noted in this document were discussed and addressed.  A physical exam was not performed with this format.  I connected with Sergio Baker on 05/16/19 at 3:00 by telephone and verified that I am speaking with the correct person using two identifiers. Sergio Baker is currently located at home and his wife is currently with him during visit. The provider, Mary-Margaret Hassell Done, FNP is located in their office at time of visit.  I discussed the limitations, risks, security and privacy concerns of performing an evaluation and management service by telephone and the availability of in person appointments. I also discussed with the patient that there may be a patient responsible charge related to this service. The patient expressed understanding and agreed to proceed.   History and Present Illness:   Chief Complaint: Hypertension   HPI Patient c/o elevated blood pressure. His wife says it has been running 179/102. Yesterday morning 178/109.  He says he feels fine.   BP Readings from Last 3 Encounters:  02/21/19 (!) 141/80  12/15/18 (!) 153/87  09/13/18 (!) 166/92    ROS   Observations/Objective: Alert and oriented- answers all questions appropriately No distress    Assessment and Plan: Sergio Baker in today with chief complaint of Hypertension   1. Essential hypertension Low sodium diet Keep diary of blood pressure Meds ordered this encounter  Medications  . lisinopril-hydrochlorothiazide (ZESTORETIC) 20-12.5 MG tablet    Sig: Take 1 tablet by mouth daily.    Dispense:  90 tablet    Refill:  1    Order Specific Question:    Supervising Provider    Answer:   Caryl Pina A [id number]      Follow Up Instructions: Has follow up appointment oct. 7,2020    I discussed the assessment and treatment plan with the patient. The patient was provided an opportunity to ask questions and all were answered. The patient agreed with the plan and demonstrated an understanding of the instructions.   The patient was advised to call back or seek an in-person evaluation if the symptoms worsen or if the condition fails to improve as anticipated.  The above assessment and management plan was discussed with the patient. The patient verbalized understanding of and has agreed to the management plan. Patient is aware to call the clinic if symptoms persist or worsen. Patient is aware when to return to the clinic for a follow-up visit. Patient educated on when it is appropriate to go to the emergency department.   Time call ended:  3:10  I provided 10 minutes of non-face-to-face time during this encounter.    Mary-Margaret Hassell Done, FNP

## 2019-05-24 ENCOUNTER — Encounter: Payer: Self-pay | Admitting: Nurse Practitioner

## 2019-05-24 ENCOUNTER — Ambulatory Visit (INDEPENDENT_AMBULATORY_CARE_PROVIDER_SITE_OTHER): Payer: Medicare Other | Admitting: Nurse Practitioner

## 2019-05-24 DIAGNOSIS — Z6827 Body mass index (BMI) 27.0-27.9, adult: Secondary | ICD-10-CM

## 2019-05-24 DIAGNOSIS — F3342 Major depressive disorder, recurrent, in full remission: Secondary | ICD-10-CM | POA: Diagnosis not present

## 2019-05-24 DIAGNOSIS — E785 Hyperlipidemia, unspecified: Secondary | ICD-10-CM

## 2019-05-24 DIAGNOSIS — E034 Atrophy of thyroid (acquired): Secondary | ICD-10-CM | POA: Diagnosis not present

## 2019-05-24 DIAGNOSIS — G10 Huntington's disease: Secondary | ICD-10-CM

## 2019-05-24 DIAGNOSIS — N400 Enlarged prostate without lower urinary tract symptoms: Secondary | ICD-10-CM

## 2019-05-24 DIAGNOSIS — K219 Gastro-esophageal reflux disease without esophagitis: Secondary | ICD-10-CM

## 2019-05-24 DIAGNOSIS — R269 Unspecified abnormalities of gait and mobility: Secondary | ICD-10-CM

## 2019-05-24 DIAGNOSIS — R413 Other amnesia: Secondary | ICD-10-CM

## 2019-05-24 DIAGNOSIS — K5730 Diverticulosis of large intestine without perforation or abscess without bleeding: Secondary | ICD-10-CM

## 2019-05-24 MED ORDER — LISINOPRIL-HYDROCHLOROTHIAZIDE 20-12.5 MG PO TABS: 1.0000 | ORAL_TABLET | Freq: Every day | ORAL | 1 refills | Status: DC

## 2019-05-24 MED ORDER — OMEPRAZOLE 20 MG PO CPDR: 20.0000 mg | DELAYED_RELEASE_CAPSULE | Freq: Every day | ORAL | 1 refills | Status: DC

## 2019-05-24 MED ORDER — LEVOTHYROXINE SODIUM 50 MCG PO TABS: 50.0000 ug | ORAL_TABLET | Freq: Every day | ORAL | 1 refills | Status: DC

## 2019-05-24 MED ORDER — HALOPERIDOL 1 MG PO TABS: 1.5000 mg | ORAL_TABLET | Freq: Two times a day (BID) | ORAL | 1 refills | Status: DC

## 2019-05-24 MED ORDER — CITALOPRAM HYDROBROMIDE 20 MG PO TABS: 20.0000 mg | ORAL_TABLET | Freq: Every day | ORAL | 1 refills | Status: DC

## 2019-05-24 NOTE — Progress Notes (Signed)
Virtual Visit via telephone Note Due to COVID-19 pandemic this visit was conducted virtually. This visit type was conducted due to national recommendations for restrictions regarding the COVID-19 Pandemic (e.g. social distancing, sheltering in place) in an effort to limit this patient's exposure and mitigate transmission in our community. All issues noted in this document were discussed and addressed.  A physical exam was not performed with this format.  I connected with Sergio Baker on 05/24/19 at 2:00 by telephone and verified that I am speaking with the correct person using two identifiers. Sergio Baker is currently located at home and his wife  is currently with him during visit. The provider, Mary-Margaret Hassell Done, FNP is located in their office at time of visit.  I discussed the limitations, risks, security and privacy concerns of performing an evaluation and management service by telephone and the availability of in person appointments. I also discussed with the patient that there may be a patient responsible charge related to this service. The patient expressed understanding and agreed to proceed.   History and Present Illness:   Chief Complaint: Medical Management of Chronic Issues    HPI:  1. Hyperlipidemia with target LDL less than 100 Wife tries not to cook a lot of fried foods for him. He is not able to do any exercise. Lab Results  Component Value Date   CHOL 194 05/08/2019   HDL 37 (L) 05/08/2019   LDLCALC 137 (H) 05/08/2019   TRIG 110 05/08/2019   CHOLHDL 5.2 (H) 05/08/2019     2. Hypothyroidism due to acquired atrophy of thyroid No problems that aware of. Lab Results  Component Value Date   TSH 3.680 05/08/2019     3. Gastroesophageal reflux disease, unspecified whether esophagitis present Is on omeprazole daily nad works well to keep symptoms under control.   4. Recurrent major depressive disorder, in full remission ([place]) Is on celexa daily and is  doing well according to his wife.  Depression screen [facility] 2/9 05/24/2019 02/21/2019 11/22/2018  Decreased Interest 0 0 0  Down, Depressed, Hopeless 0 0 0  PHQ - 2 Score 0 0 0  Altered sleeping - - -  Tired, decreased energy - - -  Change in appetite - - -  Feeling bad or failure about yourself  - - -  Trouble concentrating - - -  Moving slowly or fidgety/restless - - -  Suicidal thoughts - - -  PHQ-9 Score - - -    5. Memory deficits Wife says he is about the same.  6. Diverticulosis of large intestine without hemorrhage No recent flare ups  7. Huntington's chorea ([place]) Is on halidol currently. He is doing okay. His jerkiness is a little worse.  8. Abnormality of gait Gait is unsteady but he has had no falls. Will not use a walker when walking  9. Benign prostatic hyperplasia without lower urinary tract symptoms No problems voiding. Is currently on no meds  10. BMI 27.0-27.9,adult No recent weight changes    Outpatient Encounter Medications as of 05/24/2019  Medication Sig  . acetaminophen (TYLENOL) 650 MG CR tablet Take 1,300 mg by mouth daily as needed for pain.  . Aloe Vera 25 MG CAPS Take 25 mg by mouth daily.  Marland Kitchen aspirin EC 81 MG tablet Take 81 mg by mouth daily.  Marland Kitchen CALCIUM CITRATE PO Take 1 tablet by mouth daily.  . Cholecalciferol (VITAMIN D3) 2000 UNITS capsule Take 2,000 Units by mouth daily.  . citalopram (CELEXA)  20 MG tablet Take 1 tablet (20 mg total) by mouth daily.  . Glucosamine-Chondroit-Vit C-Mn (GLUCOSAMINE CHONDR 1500 COMPLX PO) Take 1 tablet by mouth daily.  . haloperidol (HALDOL) 1 MG tablet Take 1.5 tablets (1.5 mg total) by mouth 2 (two) times daily.  Marland Kitchen levothyroxine (SYNTHROID) 50 MCG tablet Take 1 tablet (50 mcg total) by mouth daily before breakfast.  . lisinopril-hydrochlorothiazide (ZESTORETIC) 20-12.5 MG tablet Take 1 tablet by mouth daily.  . Methylsulfonylmethane (MSM) 1000 MG CAPS Take 1,000 mg by mouth daily.  . Multiple Vitamin (MULTIVITAMIN  WITH MINERALS) TABS Take 1 tablet by mouth daily. For Men 50+  . Omega-3 Fatty Acids (FISH OIL PO) Take 1 tablet by mouth daily.  Marland Kitchen omeprazole (PRILOSEC) 20 MG capsule Take 1 capsule (20 mg total) by mouth daily.     Past Surgical History:  Procedure Laterality Date  . APPENDECTOMY    . BIOPSY  07/19/2017   Procedure: BIOPSY;  Surgeon: Daneil Dolin, MD;  [facility];  Service: Endoscopy;;  colon  . BRAIN SURGERY    . CATARACT EXTRACTION W/PHACO Left 05/07/2017   Procedure: CATARACT EXTRACTION PHACO AND INTRAOCULAR LENS PLACEMENT (IOC);  Surgeon: Baruch Goldmann, MD;  Location: [facility];  Service: Ophthalmology;  Laterality: Left;  CDE: 10.49  . CATARACT EXTRACTION W/PHACO Right 06/04/2017   Procedure: CATARACT EXTRACTION PHACO AND INTRAOCULAR LENS PLACEMENT RIGHT EYE;  Surgeon: Baruch Goldmann, MD;  Location: [facility];  Service: Ophthalmology;  Laterality: Right;  CDE: 7.07  . COLONOSCOPY  02/2010   patchy erythema, minimal granularity in distal rectum, left-sided diverticula, ileal diverticulum.  Bx benign. next TCS 02/2012  . COLONOSCOPY  03/24/2012   Dr. Gala Romney- inflammatory changes of the rectum and colon consistent with history of U/C. bx= benign colonic mucosa  . COLONOSCOPY N/A 10/05/2014   RMR: abnormal rectal and sigmoid mucosa as described above status post segmental biopsy. active colitis sigmoid colon.  . COLONOSCOPY WITH PROPOFOL N/A 07/19/2017   Procedure: COLONOSCOPY WITH PROPOFOL;  Surgeon: Daneil Dolin, MD;  [facility];  Service: Endoscopy;  Laterality: N/A;  7:30am  . cyst removed from urinary bladder    . ESOPHAGOGASTRODUODENOSCOPY  04/2008   schatzki ring, s/p ED, hh  . LUMBAR FUSION    . SHOULDER ARTHROSCOPY W/ ROTATOR CUFF REPAIR Bilateral   . TONSILLECTOMY      Family History  Problem Relation Age of Onset  . Lung cancer Mother 52       deceased  . Other Brother        Posttraumatic stress disorder  . Cancer Brother        skin cancer  .  Cancer Sister        thyroid  . Arthritis Sister   . SIDS Brother   . Huntington's disease Daughter   . Colon cancer Neg Hx     New complaints: Was recently started on zestoretic and blood pressure has still been elevated. The last 2 days have been better. Is usually high in the mornings  Social history: Lives with his wife and she takes care of him.  Controlled substance contract: n/a     Review of Systems  Constitutional: Negative for diaphoresis and weight loss.  Eyes: Negative for blurred vision, double vision and pain.  Respiratory: Negative for shortness of breath.   Cardiovascular: Negative for chest pain, palpitations, orthopnea and leg swelling.  Gastrointestinal: Negative for abdominal pain.  Musculoskeletal: Positive for back pain.  Unsteady gait  Skin: Negative for rash.  Neurological: Negative for dizziness, sensory change, loss of consciousness, weakness and headaches.  Endo/Heme/Allergies: Negative for polydipsia. Does not bruise/bleed easily.  Psychiatric/Behavioral: Negative for memory loss. The patient does not have insomnia.   All other systems reviewed and are negative.    Observations/Objective: Alert and oriented- answers all questions appropriately No distress    Assessment and Plan: Sergio Baker comes in today with chief complaint of Medical Management of Chronic Issues   Diagnosis and orders addressed:  1. Hyperlipidemia with target LDL less than 100 Low fat diet  2. Hypothyroidism due to acquired atrophy of thyroid - levothyroxine (SYNTHROID) 50 MCG tablet; Take 1 tablet (50 mcg total) by mouth daily before breakfast.  Dispense: 90 tablet; Refill: 1  3. Gastroesophageal reflux disease, unspecified whether esophagitis present Avoid spicy foods Do not eat 2 hours prior to bedtime  4. Recurrent major depressive disorder, in full remission ([place]) Stress management - citalopram (CELEXA) 20 MG tablet; Take 1 tablet (20 mg total) by  mouth daily.  Dispense: 90 tablet; Refill: 1  5. Memory deficits Wife will continue to orient daily  6. Diverticulosis of large intestine without hemorrhage Avoid foods with small seeds or skin  7. Huntington's chorea ([place]) Fall prevention - haloperidol (HALDOL) 1 MG tablet; Take 1.5 tablets (1.5 mg total) by mouth 2 (two) times daily.  Dispense: 270 tablet; Refill: 1  8. Abnormality of gait Fall prevention  9. Benign prostatic hyperplasia without lower urinary tract symptoms  10. BMI 27.0-27.9,adult Discussed diet and exercise for person with BMI >25 Will recheck weight in 3-6 months  11. Gastroesophageal reflux disease Avoid spicy foods Do not eat 2 hours prior to bedtime - omeprazole (PRILOSEC) 20 MG capsule; Take 1 capsule (20 mg total) by mouth daily.  Dispense: 90 capsule; Refill: 1   previous labs reviewed Health Maintenance reviewed Diet and exercise encouraged  Follow up plan: 3 months     I discussed the assessment and treatment plan with the patient. The patient was provided an opportunity to ask questions and all were answered. The patient agreed with the plan and demonstrated an understanding of the instructions.   The patient was advised to call back or seek an in-person evaluation if the symptoms worsen or if the condition fails to improve as anticipated.  The above assessment and management plan was discussed with the patient. The patient verbalized understanding of and has agreed to the management plan. Patient is aware to call the clinic if symptoms persist or worsen. Patient is aware when to return to the clinic for a follow-up visit. Patient educated on when it is appropriate to go to the emergency department.   Time call ended:  2:18  I provided 18  minutes of non-face-to-face time during this encounter.    Mary-Margaret Hassell Done, FNP

## 2019-07-18 ENCOUNTER — Telehealth: Payer: Self-pay | Admitting: Nurse Practitioner

## 2019-07-25 NOTE — Telephone Encounter (Signed)
Appointment scheduled for 07/27/2019 with Sergio Baker.

## 2019-07-26 ENCOUNTER — Other Ambulatory Visit: Payer: Self-pay

## 2019-07-27 ENCOUNTER — Encounter: Payer: Self-pay | Admitting: Nurse Practitioner

## 2019-07-27 ENCOUNTER — Ambulatory Visit (INDEPENDENT_AMBULATORY_CARE_PROVIDER_SITE_OTHER): Payer: Medicare Other | Admitting: Nurse Practitioner

## 2019-07-27 VITALS — BP 130/82 | HR 92 | Temp 98.0°F | Resp 20 | Ht 66.0 in | Wt 159.0 lb

## 2019-07-27 DIAGNOSIS — H6122 Impacted cerumen, left ear: Secondary | ICD-10-CM | POA: Diagnosis not present

## 2019-07-27 NOTE — Progress Notes (Signed)
   Subjective:    Patient ID: Sergio Baker, male    DOB: 1934-10-22, 84 y.o.   MRN: [id number]   Chief Complaint: Wants ear cleaned out   HPI Patient went to see audiology and was told he needed to come here and have ears cleaned out. He has been having trouble hearing.   Review of Systems  Constitutional: Negative for diaphoresis.  HENT: Positive for hearing loss.   Eyes: Negative for pain.  Respiratory: Negative for shortness of breath.   Cardiovascular: Negative for chest pain, palpitations and leg swelling.  Gastrointestinal: Negative for abdominal pain.  Endocrine: Negative for polydipsia.  Skin: Negative for rash.  Neurological: Negative for dizziness, weakness and headaches.  Hematological: Does not bruise/bleed easily.  All other systems reviewed and are negative.      Objective:   Physical Exam Vitals and nursing note reviewed.  Constitutional:      Appearance: Normal appearance.  HENT:     Right Ear: Hearing, tympanic membrane, ear canal and external ear normal.     Left Ear: Hearing, tympanic membrane, ear canal and external ear normal.     Ears:     Comments: left cerumen impaction Cardiovascular:     Rate and Rhythm: Regular rhythm.     Heart sounds: Normal heart sounds.  Pulmonary:     Breath sounds: Normal breath sounds.  Skin:    General: Skin is warm.  Neurological:     General: No focal deficit present.     Mental Status: He is alert and oriented to person, place, and time.     S/P left ear irrigation TM clear   BP 130/82   Pulse 92   Temp 98 F (36.7 C) (Temporal)   Resp 20   Ht 5' 6"  (1.676 m)   Wt 159 lb (72.1 kg)   SpO2 97%   BMI 25.66 kg/m      Assessment & Plan:  [name] in today with chief complaint of Wants ear cleaned out   1. Impacted cerumen of left ear Continue debrox daily  Mary-Margaret Hassell Done, FNP

## 2019-08-28 ENCOUNTER — Telehealth: Payer: Self-pay | Admitting: Nurse Practitioner

## 2019-08-28 DIAGNOSIS — K5730 Diverticulosis of large intestine without perforation or abscess without bleeding: Secondary | ICD-10-CM

## 2019-08-28 DIAGNOSIS — G10 Huntington's disease: Secondary | ICD-10-CM

## 2019-08-28 DIAGNOSIS — E034 Atrophy of thyroid (acquired): Secondary | ICD-10-CM

## 2019-08-28 DIAGNOSIS — E785 Hyperlipidemia, unspecified: Secondary | ICD-10-CM

## 2019-08-28 NOTE — Telephone Encounter (Signed)
Lab orders in computer

## 2019-08-29 ENCOUNTER — Other Ambulatory Visit: Payer: Medicare Other

## 2019-08-29 ENCOUNTER — Other Ambulatory Visit: Payer: Self-pay

## 2019-08-29 DIAGNOSIS — E785 Hyperlipidemia, unspecified: Secondary | ICD-10-CM

## 2019-08-29 DIAGNOSIS — E034 Atrophy of thyroid (acquired): Secondary | ICD-10-CM

## 2019-08-29 DIAGNOSIS — G10 Huntington's disease: Secondary | ICD-10-CM

## 2019-08-30 LAB — CBC WITH DIFFERENTIAL/PLATELET
Basophils Absolute: 0.1 10*3/uL (ref 0.0–0.2)
Basos: 1 %
EOS (ABSOLUTE): 0.2 10*3/uL (ref 0.0–0.4)
Eos: 4 %
Hematocrit: 49 % (ref 37.5–51.0)
Hemoglobin: 16.7 g/dL (ref 13.0–17.7)
Immature Grans (Abs): 0 10*3/uL (ref 0.0–0.1)
Immature Granulocytes: 0 %
Lymphocytes Absolute: 1.8 10*3/uL (ref 0.7–3.1)
Lymphs: 29 %
MCH: 33.3 pg — ABNORMAL HIGH (ref 26.6–33.0)
MCHC: 34.1 g/dL (ref 31.5–35.7)
MCV: 98 fL — ABNORMAL HIGH (ref 79–97)
Monocytes Absolute: 0.6 10*3/uL (ref 0.1–0.9)
Monocytes: 10 %
Neutrophils Absolute: 3.4 10*3/uL (ref 1.4–7.0)
Neutrophils: 56 %
Platelets: 226 10*3/uL (ref 150–450)
RBC: 5.02 x10E6/uL (ref 4.14–5.80)
RDW: 12.8 % (ref 11.6–15.4)
WBC: 6.1 10*3/uL (ref 3.4–10.8)

## 2019-08-30 LAB — THYROID PANEL WITH TSH
Free Thyroxine Index: 2 (ref 1.2–4.9)
T3 Uptake Ratio: 32 % (ref 24–39)
T4, Total: 6.1 ug/dL (ref 4.5–12.0)
TSH: 3.36 u[IU]/mL (ref 0.450–4.500)

## 2019-08-30 LAB — CMP14+EGFR
ALT: 15 IU/L (ref 0–44)
AST: 29 IU/L (ref 0–40)
Albumin/Globulin Ratio: 1.6 (ref 1.2–2.2)
Albumin: 4.5 g/dL (ref 3.6–4.6)
Alkaline Phosphatase: 72 IU/L (ref 39–117)
BUN/Creatinine Ratio: 16 (ref 10–24)
BUN: 15 mg/dL (ref 8–27)
Bilirubin Total: 0.8 mg/dL (ref 0.0–1.2)
CO2: 26 mmol/L (ref 20–29)
Calcium: 9.6 mg/dL (ref 8.6–10.2)
Chloride: 98 mmol/L (ref 96–106)
Creatinine, Ser: 0.91 mg/dL (ref 0.76–1.27)
GFR calc Af Amer: 90 mL/min/{1.73_m2} (ref >59)
GFR calc non Af Amer: 78 mL/min/{1.73_m2} (ref >59)
Globulin, Total: 2.8 g/dL (ref 1.5–4.5)
Glucose: 104 mg/dL — ABNORMAL HIGH (ref 65–99)
Potassium: 4 mmol/L (ref 3.5–5.2)
Sodium: 140 mmol/L (ref 134–144)
Total Protein: 7.3 g/dL (ref 6.0–8.5)

## 2019-08-30 LAB — LIPID PANEL
Chol/HDL Ratio: 5.6 ratio — ABNORMAL HIGH (ref 0.0–5.0)
Cholesterol, Total: 202 mg/dL — ABNORMAL HIGH (ref 100–199)
HDL: 36 mg/dL — ABNORMAL LOW (ref >39)
LDL Chol Calc (NIH): 142 mg/dL — ABNORMAL HIGH (ref 0–99)
Triglycerides: 133 mg/dL (ref 0–149)
VLDL Cholesterol Cal: 24 mg/dL (ref 5–40)

## 2019-08-31 ENCOUNTER — Other Ambulatory Visit: Payer: Self-pay

## 2019-09-01 ENCOUNTER — Encounter: Payer: Self-pay | Admitting: Nurse Practitioner

## 2019-09-01 ENCOUNTER — Ambulatory Visit (INDEPENDENT_AMBULATORY_CARE_PROVIDER_SITE_OTHER): Payer: Medicare Other | Admitting: Nurse Practitioner

## 2019-09-01 VITALS — BP 127/73 | HR 59 | Temp 96.9°F | Resp 20 | Ht 66.0 in | Wt 161.0 lb

## 2019-09-01 DIAGNOSIS — R269 Unspecified abnormalities of gait and mobility: Secondary | ICD-10-CM

## 2019-09-01 DIAGNOSIS — G10 Huntington's disease: Secondary | ICD-10-CM

## 2019-09-01 DIAGNOSIS — E785 Hyperlipidemia, unspecified: Secondary | ICD-10-CM | POA: Diagnosis not present

## 2019-09-01 DIAGNOSIS — E034 Atrophy of thyroid (acquired): Secondary | ICD-10-CM | POA: Diagnosis not present

## 2019-09-01 DIAGNOSIS — K219 Gastro-esophageal reflux disease without esophagitis: Secondary | ICD-10-CM | POA: Insufficient documentation

## 2019-09-01 DIAGNOSIS — Z6827 Body mass index (BMI) 27.0-27.9, adult: Secondary | ICD-10-CM

## 2019-09-01 DIAGNOSIS — K5730 Diverticulosis of large intestine without perforation or abscess without bleeding: Secondary | ICD-10-CM

## 2019-09-01 DIAGNOSIS — N400 Enlarged prostate without lower urinary tract symptoms: Secondary | ICD-10-CM | POA: Diagnosis not present

## 2019-09-01 DIAGNOSIS — R413 Other amnesia: Secondary | ICD-10-CM

## 2019-09-01 DIAGNOSIS — F3342 Major depressive disorder, recurrent, in full remission: Secondary | ICD-10-CM

## 2019-09-01 MED ORDER — CITALOPRAM HYDROBROMIDE 20 MG PO TABS: 20.0000 mg | ORAL_TABLET | Freq: Every day | ORAL | 1 refills | Status: DC

## 2019-09-01 MED ORDER — LEVOTHYROXINE SODIUM 50 MCG PO TABS: 50.0000 ug | ORAL_TABLET | Freq: Every day | ORAL | 1 refills | Status: DC

## 2019-09-01 MED ORDER — LISINOPRIL-HYDROCHLOROTHIAZIDE 20-12.5 MG PO TABS: 1.0000 | ORAL_TABLET | Freq: Every day | ORAL | 1 refills | Status: DC

## 2019-09-01 MED ORDER — OMEPRAZOLE 20 MG PO CPDR: 20.0000 mg | DELAYED_RELEASE_CAPSULE | Freq: Every day | ORAL | 1 refills | Status: DC

## 2019-09-01 MED ORDER — HALOPERIDOL 1 MG PO TABS: 1.5000 mg | ORAL_TABLET | Freq: Two times a day (BID) | ORAL | 1 refills | Status: DC

## 2019-09-01 NOTE — Progress Notes (Signed)
Subjective:    Patient ID: Sergio Baker, male    DOB: 1935-06-29, 85 y.o.   MRN: [id number]   Chief Complaint: Medical Management of Chronic Issues    HPI:  1. Hypothyroidism due to acquired atrophy of thyroid No problems that he is aware of. Lab Results  Component Value Date   TSH 3.360 08/29/2019     2. Gastroesophageal reflux disease, unspecified whether esophagitis present Is on omeprazole daily and works well to keep symptoms under control.  3. Hyperlipidemia with target LDL less than 100 Eats whatever his wife fixes him . Does little to no exerise. He refuses statin therapy. Lab Results  Component Value Date   CHOL 202 (H) 08/29/2019   HDL 36 (L) 08/29/2019   LDLCALC 142 (H) 08/29/2019   TRIG 133 08/29/2019   CHOLHDL 5.6 (H) 08/29/2019     4. Huntington's chorea ([place]) Fairly stabke. Has good days and bad days. See dr. Jannifer Franklin yearly.  5. Benign prostatic hyperplasia without lower urinary tract symptoms dnies any problem passing his water. Denies urgency and frequency.  6. Diverticulosis of large intestine without hemorrhage No recent flare ups  7. Memory deficits Wife says no real change  8. Abnormality of gait Gait is slow and steady. deneis any recent falls  9. Recurrent major depressive disorder, in full remission ([place]) Is on celexa and wife says he is doing well. Depression screen [facility] 2/9 09/01/2019 07/27/2019 05/24/2019  Decreased Interest 0 0 0  Down, Depressed, Hopeless 0 0 0  PHQ - 2 Score 0 0 0  Altered sleeping - - -  Tired, decreased energy - - -  Change in appetite - - -  Feeling bad or failure about yourself  - - -  Trouble concentrating - - -  Moving slowly or fidgety/restless - - -  Suicidal thoughts - - -  PHQ-9 Score - - -     10. BMI 27.0-27.9,adult No recent weight changes Wt Readings from Last 3 Encounters:  09/01/19 161 lb (73 kg)  07/27/19 159 lb (72.1 kg)  02/21/19 161 lb (73 kg)   BMI Readings from Last 3  Encounters:  09/01/19 25.99 kg/m  07/27/19 25.66 kg/m  02/21/19 25.99 kg/m       Outpatient Encounter Medications as of 09/01/2019  Medication Sig  . acetaminophen (TYLENOL) 650 MG CR tablet Take 1,300 mg by mouth daily as needed for pain.  . Aloe Vera 25 MG CAPS Take 25 mg by mouth daily.  Marland Kitchen aspirin EC 81 MG tablet Take 81 mg by mouth daily.  Marland Kitchen CALCIUM CITRATE PO Take 1 tablet by mouth daily.  . Cholecalciferol (VITAMIN D3) 2000 UNITS capsule Take 2,000 Units by mouth daily.  . citalopram (CELEXA) 20 MG tablet Take 1 tablet (20 mg total) by mouth daily.  . Glucosamine-Chondroit-Vit C-Mn (GLUCOSAMINE CHONDR 1500 COMPLX PO) Take 1 tablet by mouth daily.  . haloperidol (HALDOL) 1 MG tablet Take 1.5 tablets (1.5 mg total) by mouth 2 (two) times daily.  Marland Kitchen levothyroxine (SYNTHROID) 50 MCG tablet Take 1 tablet (50 mcg total) by mouth daily before breakfast.  . lisinopril-hydrochlorothiazide (ZESTORETIC) 20-12.5 MG tablet Take 1 tablet by mouth daily.  . Methylsulfonylmethane (MSM) 1000 MG CAPS Take 1,000 mg by mouth daily.  . Multiple Vitamin (MULTIVITAMIN WITH MINERALS) TABS Take 1 tablet by mouth daily. For Men 50+  . Omega-3 Fatty Acids (FISH OIL PO) Take 1 tablet by mouth daily.  Marland Kitchen omeprazole (PRILOSEC) 20 MG capsule Take 1  capsule (20 mg total) by mouth daily.   No facility-administered encounter medications on file as of 09/01/2019.    Past Surgical History:  Procedure Laterality Date  . APPENDECTOMY    . BIOPSY  07/19/2017   Procedure: BIOPSY;  Surgeon: Daneil Dolin, MD;  Location: [facility];  Service: Endoscopy;;  colon  . BRAIN SURGERY    . CATARACT EXTRACTION W/PHACO Left 05/07/2017   Procedure: CATARACT EXTRACTION PHACO AND INTRAOCULAR LENS PLACEMENT (IOC);  Surgeon: Baruch Goldmann, MD;  Location: [facility];  Service: Ophthalmology;  Laterality: Left;  CDE: 10.49  . CATARACT EXTRACTION W/PHACO Right 06/04/2017   Procedure: CATARACT EXTRACTION PHACO AND INTRAOCULAR LENS  PLACEMENT RIGHT EYE;  Surgeon: Baruch Goldmann, MD;  Location: [facility];  Service: Ophthalmology;  Laterality: Right;  CDE: 7.07  . COLONOSCOPY  02/2010   patchy erythema, minimal granularity in distal rectum, left-sided diverticula, ileal diverticulum.  Bx benign. next TCS 02/2012  . COLONOSCOPY  03/24/2012   Dr. Gala Romney- inflammatory changes of the rectum and colon consistent with history of U/C. bx= benign colonic mucosa  . COLONOSCOPY N/A 10/05/2014   RMR: abnormal rectal and sigmoid mucosa as described above status post segmental biopsy. active colitis sigmoid colon.  . COLONOSCOPY WITH PROPOFOL N/A 07/19/2017   Procedure: COLONOSCOPY WITH PROPOFOL;  Surgeon: Daneil Dolin, MD;  Location: [facility];  Service: Endoscopy;  Laterality: N/A;  7:30am  . cyst removed from urinary bladder    . ESOPHAGOGASTRODUODENOSCOPY  04/2008   schatzki ring, s/p ED, [facility]  . LUMBAR FUSION    . SHOULDER ARTHROSCOPY W/ ROTATOR CUFF REPAIR Bilateral   . TONSILLECTOMY      Family History  Problem Relation Age of Onset  . Lung cancer Mother 44       deceased  . Other Brother        Posttraumatic stress disorder  . Cancer Brother        skin cancer  . Cancer Sister        thyroid  . Arthritis Sister   . SIDS Brother   . Huntington's disease Daughter   . Colon cancer Neg Hx     New complaints: None today  Social history: Lives with wife  Controlled substance contract: n/a    Review of Systems  Constitutional: Negative for diaphoresis.  Eyes: Negative for pain.  Respiratory: Negative for shortness of breath.   Cardiovascular: Negative for chest pain, palpitations and leg swelling.  Gastrointestinal: Negative for abdominal pain.  Endocrine: Negative for polydipsia.  Skin: Negative for rash.  Neurological: Negative for dizziness, weakness and headaches.  Hematological: Does not bruise/bleed easily.  All other systems reviewed and are negative.      Objective:   Physical Exam Vitals and  nursing note reviewed.  Constitutional:      Appearance: Normal appearance. He is well-developed.  HENT:     Head: Normocephalic.     Nose: Nose normal.  Eyes:     Pupils: Pupils are equal, round, and reactive to light.  Neck:     Thyroid: No thyroid mass or thyromegaly.     Vascular: No carotid bruit or JVD.     Trachea: Phonation normal.  Cardiovascular:     Rate and Rhythm: Normal rate and regular rhythm.  Pulmonary:     Effort: Pulmonary effort is normal. No respiratory distress.     Breath sounds: Normal breath sounds.  Abdominal:     General: Bowel sounds are normal.  Palpations: Abdomen is soft.     Tenderness: There is no abdominal tenderness.  Musculoskeletal:        General: Normal range of motion.     Cervical back: Normal range of motion and neck supple.     Comments: Gait slow and steady  Lymphadenopathy:     Cervical: No cervical adenopathy.  Skin:    General: Skin is warm and dry.  Neurological:     Mental Status: He is alert and oriented to person, place, and time.     Comments: Constant jerky movements  Psychiatric:        Behavior: Behavior normal.        Thought Content: Thought content normal.        Judgment: Judgment normal.     BP 127/73   Pulse (!) 59   Temp (!) 96.9 F (36.1 C) (Temporal)   Resp 20   Ht 5' 6"  (1.676 m)   Wt 161 lb (73 kg)   SpO2 96%   BMI 25.99 kg/m        Assessment & Plan:  DAIQUAN RESNIK comes in today with chief complaint of Medical Management of Chronic Issues   Diagnosis and orders addressed:  1. Hypothyroidism due to acquired atrophy of thyroid - levothyroxine (SYNTHROID) 50 MCG tablet; Take 1 tablet (50 mcg total) by mouth daily before breakfast.  Dispense: 90 tablet; Refill: 1 - Thyroid Panel With TSH  2. Gastroesophageal reflux disease Avoid spicy foods Do not eat 2 hours prior to bedtime - omeprazole (PRILOSEC) 20 MG capsule; Take 1 capsule (20 mg total) by mouth daily.  Dispense: 90 capsule;  Refill: 1  3. Hyperlipidemia with target LDL less than 100 Low fat diet - CMP14+EGFR - Lipid panel  4. Huntington's chorea ([place]) Need to follow up with dr. Jannifer Franklin - haloperidol (HALDOL) 1 MG tablet; Take 1.5 tablets (1.5 mg total) by mouth 2 (two) times daily.  Dispense: 270 tablet; Refill: 1  5. Benign prostatic hyperplasia without lower urinary tract symptoms  6. Diverticulosis of large intestine without hemorrhage Continue to wathc diet  7. Memory deficits orient daily  8. Abnormality of gait fall prevention  9. Recurrent major depressive disorder, in full remission ([place]) Stress management - citalopram (CELEXA) 20 MG tablet; Take 1 tablet (20 mg total) by mouth daily.  Dispense: 90 tablet; Refill: 1  10. BMI 27.0-27.9,adult Discussed diet and exercise for person with BMI >25 Will recheck weight in 3-6 months   Labs pending Health Maintenance reviewed Diet and exercise encouraged  Follow up plan: 3 months   Mary-Margaret Hassell Done, FNP

## 2019-09-01 NOTE — Patient Instructions (Signed)

## 2019-09-14 ENCOUNTER — Other Ambulatory Visit: Payer: Self-pay

## 2019-09-14 ENCOUNTER — Encounter: Payer: Self-pay | Admitting: Family Medicine

## 2019-09-14 ENCOUNTER — Ambulatory Visit (INDEPENDENT_AMBULATORY_CARE_PROVIDER_SITE_OTHER): Payer: Medicare Other

## 2019-09-14 ENCOUNTER — Ambulatory Visit (INDEPENDENT_AMBULATORY_CARE_PROVIDER_SITE_OTHER): Payer: Medicare Other | Admitting: Family Medicine

## 2019-09-14 VITALS — BP 141/78 | HR 52 | Temp 97.5°F | Resp 22 | Ht 66.0 in | Wt 160.0 lb

## 2019-09-14 DIAGNOSIS — W19XXXA Unspecified fall, initial encounter: Secondary | ICD-10-CM

## 2019-09-14 DIAGNOSIS — M25512 Pain in left shoulder: Secondary | ICD-10-CM | POA: Diagnosis not present

## 2019-09-14 DIAGNOSIS — S42225A 2-part nondisplaced fracture of surgical neck of left humerus, initial encounter for closed fracture: Secondary | ICD-10-CM

## 2019-09-14 DIAGNOSIS — S42215A Unspecified nondisplaced fracture of surgical neck of left humerus, initial encounter for closed fracture: Secondary | ICD-10-CM | POA: Diagnosis not present

## 2019-09-14 NOTE — Patient Instructions (Signed)
Humerus Fracture Treated With Immobilization  The humerus is the large bone in the upper arm. A broken (fractured) humerus is often treated by wearing a cast, splint, or sling (immobilization). This holds the broken pieces in place so they can heal. What are the causes? This condition may be caused by:  A fall.  A hard, direct hit to the arm.  A car accident. What increases the risk? You are more likely to develop this condition if:  You are elderly.  You have a disease that makes the bones thin and weak. What are the signs or symptoms?  Pain.  Swelling.  Bruising.  Not being able to move your arm normally. How is this treated? Treatment involves wearing a cast, splint, or sling until your arm heals enough for you to begin range-of-motion exercises. You may also be prescribed pain medicine. Follow these instructions at home: If you have a cast:  Do not stick anything inside the cast to scratch your skin.  Check the skin around the cast every day. Tell your doctor if you have any concerns.  You may put lotion on dry skin around the edges of the cast. Do not put lotion on the skin under the cast.  Keep the cast clean and dry. If you have a splint or sling:  Wear the splint or sling as told by your doctor. Remove it only as told by your doctor.  Loosen the splint or sling if your fingers: ? Tingle. ? Become numb. ? Turn cold and blue.  Keep the splint or sling clean and dry. Bathing  Do not take baths, swim, or use a hot tub until your doctor says that you can. Ask your doctor if you may take showers. You may only be allowed to take sponge baths.  If your cast, splint, or sling is not waterproof: ? Do not let it get wet. ? Cover it with a watertight covering when you take a bath or shower.  If you have a sling, remove it for bathing only if your doctor says this is okay. Managing pain, stiffness, and swelling   If told, put ice on the injured area. ? If you  have a removable splint or sling, remove it as told by your doctor. ? Put ice in a plastic bag. ? Place a towel between your skin and the bag or between your cast and the bag. ? Leave the ice on for 20 minutes, 2-3 times a day.  Move your fingers often.  Raise (elevate) the injured area above the level of your heart while you are sitting or lying down. Driving  Do not drive or use heavy machinery while taking prescription pain medicine.  Do not drive while wearing a cast, splint, or sling on an arm that you use for driving. Activity  Return to your normal activities as told by your doctor. Ask your doctor what activities are safe for you.  Do not lift anything until your doctor says that it is safe.  Do range-of-motion exercises only as told by your doctor. General instructions  Do not put pressure on any part of the cast or splint until it is fully hardened. This may take many hours.  Do not use any products that contain nicotine or tobacco, such as cigarettes, e-cigarettes, and chewing tobacco. These can delay bone healing. If you need help quitting, ask your doctor.  Take over-the-counter and prescription medicines only as told by your doctor.  Ask your doctor if the medicine  you are taking can cause trouble pooping (constipation). You may need to take steps to prevent or treat trouble pooping: ? Drink enough fluid to keep your pee (urine) pale yellow. ? Take over-the-counter or prescription medicines. ? Eat foods that are high in fiber. These include beans, whole grains, and fresh fruits and vegetables. ? Limit foods that are high in fat and sugar. These include fried or sweet foods.  Keep all follow-up visits as told by your doctor. This is important. Contact a doctor if:  You have any new pain, swelling, or bruising.  Your pain, swelling, and bruising do not get better.  Your cast, splint, or sling becomes loose or damaged. Get help right away if:  Your skin or  fingers on your injured arm turn blue or gray.  Your arm is cold or numb.  You have very bad pain in your injured arm. Summary  The humerus is the large bone in the upper arm.  A broken humerus is often treated by wearing a cast, splint, or sling.  Wear a splint or sling as told by your doctor. Remove it only as told by your doctor.  Move your fingers often. This information is not intended to replace advice given to you by your health care provider. Make sure you discuss any questions you have with your health care provider. Document Revised: 04/04/2018 Document Reviewed: 04/04/2018 Elsevier Patient Education  [place].

## 2019-09-14 NOTE — Progress Notes (Signed)
Subjective:  Patient ID: Sergio Baker, male    DOB: [birth year], 85 y.o.   MRN: [id number]  Patient Care Team: Chevis Pretty, FNP as PCP - General (Nurse Practitioner) Daneil Dolin, MD (Gastroenterology)   Chief Complaint:  Fall (today around lunch time ) and left shoulder pain (went down on right knee as well - no pain )   HPI: Sergio Baker is a 85 y.o. male presenting on 09/14/2019 for Fall (today around lunch time ) and left shoulder pain (went down on right knee as well - no pain )   Pt fell in the driveway about 2 hours ago landing on his left shoulder. Pt states he has limited ROM due to the pain in his shoulder. States he took tylenol after the fall that has helped with the pain. States he only has pain when he tries to raise his arm or lift something. No other injuries in the fall. No LOC causing fall. Has Huntington's Disease causing difficulty with ambulation.    Fall The accident occurred 1 to 3 hours ago. The fall occurred while walking. He fell from a height of 1 to 2 ft. Impact surface: gravel. The point of impact was the left shoulder. The pain is present in the left upper arm and left shoulder. The pain is at a severity of 4/10. The pain is mild. The symptoms are aggravated by movement, rotation and use of injured limb. Pertinent negatives include no abdominal pain, fever, headaches, nausea or vomiting. He has tried acetaminophen for the symptoms. The treatment provided mild relief.    Relevant past medical, surgical, family, and social history reviewed and updated as indicated.  Allergies and medications reviewed and updated. Date reviewed: Chart in Epic.   Past Medical History:  Diagnosis Date  . A-fib ([place])   . Abnormality of gait   . Arthritis   . Depression   . Diverticula of colon   . Dysrhythmia    AFib  . Enlarged prostate   . GERD (gastroesophageal reflux disease)   . Hearing loss   . Hemorrhoids   . Hiatal hernia    small  . HOH  (hard of hearing)   . Huntington's disease ([place]) 2010  . Hyperlipidemia   . Hypothyroidism   . Joint pain   . Memory deficits 01/17/2014  . 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  . Schatzki's ring   . Ulcerative colitis ([place])    left side    Past Surgical History:  Procedure Laterality Date  . APPENDECTOMY    . BIOPSY  07/19/2017   Procedure: BIOPSY;  Surgeon: Daneil Dolin, MD;  Location[facility];  Service: Endoscopy;;  colon  . BRAIN SURGERY    . CATARACT EXTRACTION W/PHACO Left 05/07/2017   Procedure: CATARACT EXTRACTION PHACO AND INTRAOCULAR LENS PLACEMENT (IOC);  Surgeon: Baruch Goldmann, MD;  Location: [facility];  Service: Ophthalmology;  Laterality: Left;  CDE: 10.49  . CATARACT EXTRACTION W/PHACO Right 06/04/2017   Procedure: CATARACT EXTRACTION PHACO AND INTRAOCULAR LENS PLACEMENT RIGHT EYE;  Surgeon: Baruch Goldmann, MD;  Location: [facility];  Service: Ophthalmology;  Laterality: Right;  CDE: 7.07  . COLONOSCOPY  02/2010   patchy erythema, minimal granularity in distal rectum, left-sided diverticula, ileal diverticulum.  Bx benign. next TCS 02/2012  . COLONOSCOPY  03/24/2012   Dr. Gala Romney- inflammatory changes of the rectum and colon consistent with history of U/C. bx= benign colonic mucosa  . COLONOSCOPY  N/A 10/05/2014   RMR: abnormal rectal and sigmoid mucosa as described above status post segmental biopsy. active colitis sigmoid colon.  . COLONOSCOPY WITH PROPOFOL N/A 07/19/2017   Procedure: COLONOSCOPY WITH PROPOFOL;  Surgeon: Daneil Dolin, MD;  Location: [facility];  Service: Endoscopy;  Laterality: N/A;  7:30am  . cyst removed from urinary bladder    . ESOPHAGOGASTRODUODENOSCOPY  04/2008   schatzki ring, s/p ED, hh  . LUMBAR FUSION    . SHOULDER ARTHROSCOPY W/ ROTATOR CUFF REPAIR Bilateral   . TONSILLECTOMY      Social History   Socioeconomic History  . Marital status: Married    Spouse name: [name]  . Number of children: 1  . Years of  education: 12th  . Highest education level: 12th grade  Occupational History  . Occupation: retired Furniture conservator/restorer  . Occupation: works as Administrator, arts at [employer]  Tobacco Use  . Smoking status: Former Smoker    Packs/day: 0.25    Years: 8.00    Pack years: 2.00    Types: Cigarettes    Quit date: 05/03/1973    Years since quitting: 46.3  . Smokeless tobacco: Never Used  . Tobacco comment: Quit 1970's   Substance and Sexual Activity  . Alcohol use: No    Alcohol/week: 0.0 standard drinks  . Drug use: No  . Sexual activity: Not Currently    Birth control/protection: None  Other Topics Concern  . Not on file  Social History Narrative   Patient lives at home with his wife.    Social Determinants of Health   Financial Resource Strain:   . Difficulty of Paying Living Expenses: Not on file  Food Insecurity:   . Worried About Charity fundraiser in the Last Year: Not on file  . Ran Out of Food in the Last Year: Not on file  Transportation Needs:   . Lack of Transportation (Medical): Not on file  . Lack of Transportation (Non-Medical): Not on file  Physical Activity:   . Days of Exercise per Week: Not on file  . Minutes of Exercise per Session: Not on file  Stress:   . Feeling of Stress : Not on file  Social Connections:   . Frequency of Communication with Friends and Family: Not on file  . Frequency of Social Gatherings with Friends and Family: Not on file  . Attends Religious Services: Not on file  . Active Member of Clubs or Organizations: Not on file  . Attends Archivist Meetings: Not on file  . Marital Status: Not on file  Intimate Partner Violence:   . Fear of Current or Ex-Partner: Not on file  . Emotionally Abused: Not on file  . Physically Abused: Not on file  . Sexually Abused: Not on file    Outpatient Encounter Medications as of 09/14/2019  Medication Sig  . acetaminophen (TYLENOL) 650 MG CR tablet Take 1,300 mg by mouth daily as needed for pain.  .  Aloe Vera 25 MG CAPS Take 25 mg by mouth daily.  Marland Kitchen aspirin EC 81 MG tablet Take 81 mg by mouth daily.  Marland Kitchen CALCIUM CITRATE PO Take 1 tablet by mouth daily.  . Cholecalciferol (VITAMIN D3) 2000 UNITS capsule Take 2,000 Units by mouth daily.  . citalopram (CELEXA) 20 MG tablet Take 1 tablet (20 mg total) by mouth daily.  . Glucosamine-Chondroit-Vit C-Mn (GLUCOSAMINE CHONDR 1500 COMPLX PO) Take 1 tablet by mouth daily.  . haloperidol (HALDOL) 1 MG tablet Take 1.5  tablets (1.5 mg total) by mouth 2 (two) times daily.  Marland Kitchen levothyroxine (SYNTHROID) 50 MCG tablet Take 1 tablet (50 mcg total) by mouth daily before breakfast.  . lisinopril-hydrochlorothiazide (ZESTORETIC) 20-12.5 MG tablet Take 1 tablet by mouth daily.  . Methylsulfonylmethane (MSM) 1000 MG CAPS Take 1,000 mg by mouth daily.  . Multiple Vitamin (MULTIVITAMIN WITH MINERALS) TABS Take 1 tablet by mouth daily. For Men 50+  . Omega-3 Fatty Acids (FISH OIL PO) Take 1 tablet by mouth daily.  Marland Kitchen omeprazole (PRILOSEC) 20 MG capsule Take 1 capsule (20 mg total) by mouth daily.   No facility-administered encounter medications on file as of 09/14/2019.    No Known Allergies  Review of Systems  Constitutional: Negative for activity change, appetite change, chills, diaphoresis, fatigue, fever and unexpected weight change.  HENT: Negative.   Eyes: Negative.   Respiratory: Negative for cough, chest tightness and shortness of breath.   Cardiovascular: Negative for chest pain, palpitations and leg swelling.  Gastrointestinal: Negative for abdominal pain, blood in stool, constipation, diarrhea, nausea and vomiting.  Endocrine: Negative.   Genitourinary: Negative for dysuria, frequency and urgency.  Musculoskeletal: Positive for arthralgias and gait problem. Negative for myalgias.  Skin: Negative.   Allergic/Immunologic: Negative.   Neurological: Negative for dizziness and headaches.  Hematological: Negative.   Psychiatric/Behavioral: Negative for  confusion, hallucinations, sleep disturbance and suicidal ideas.  All other systems reviewed and are negative.       Objective:  BP (!) 141/78   Pulse (!) 52   Temp (!) 97.5 F (36.4 C)   Resp (!) 22   Ht 5' 6"  (1.676 m)   Wt 160 lb (72.6 kg)   SpO2 98%   BMI 25.82 kg/m    Wt Readings from Last 3 Encounters:  09/14/19 160 lb (72.6 kg)  09/01/19 161 lb (73 kg)  07/27/19 159 lb (72.1 kg)    Physical Exam Vitals and nursing note reviewed.  Constitutional:      General: He is not in acute distress.    Appearance: Normal appearance. He is well-developed and well-groomed. He is not ill-appearing, toxic-appearing or diaphoretic.  HENT:     Head: Normocephalic and atraumatic.     Jaw: There is normal jaw occlusion.     Right Ear: Hearing normal.     Left Ear: Hearing normal.     Nose: Nose normal.     Mouth/Throat:     Lips: Pink.     Mouth: Mucous membranes are moist.     Pharynx: Oropharynx is clear. Uvula midline.  Eyes:     General: Lids are normal.     Extraocular Movements: Extraocular movements intact.     Conjunctiva/sclera: Conjunctivae normal.     Pupils: Pupils are equal, round, and reactive to light.  Neck:     Thyroid: No thyroid mass, thyromegaly or thyroid tenderness.     Vascular: No carotid bruit or JVD.     Trachea: Trachea and phonation normal.  Cardiovascular:     Rate and Rhythm: Normal rate and regular rhythm.     Chest Wall: PMI is not displaced.     Pulses: Normal pulses.     Heart sounds: Normal heart sounds. No murmur. No friction rub. No gallop.   Pulmonary:     Effort: Pulmonary effort is normal. No respiratory distress.     Breath sounds: Normal breath sounds. No wheezing.  Abdominal:     General: Bowel sounds are normal. There is no distension or abdominal  bruit.     Palpations: Abdomen is soft. There is no hepatomegaly or splenomegaly.     Tenderness: There is no abdominal tenderness. There is no right CVA tenderness or left CVA  tenderness.     Hernia: No hernia is present.  Musculoskeletal:     Right shoulder: Normal.     Left shoulder: Swelling, tenderness, bony tenderness and crepitus present. No deformity, effusion or laceration. Decreased range of motion. Decreased strength. Normal pulse.     Left upper arm: Tenderness and bony tenderness present. No swelling, edema, deformity or lacerations.     Left elbow: Normal.       Arms:     Cervical back: Normal range of motion and neck supple.     Right hip: Normal.     Left hip: Normal.     Right knee: Normal.     Left knee: Normal.     Right lower leg: No edema.     Left lower leg: No edema.     Comments: Left shoulder: limited abduction, adduction, flexion, and extension due to pain. Grip strength normal. Tender to palpation.  Lymphadenopathy:     Cervical: No cervical adenopathy.  Skin:    General: Skin is warm and dry.     Capillary Refill: Capillary refill takes less than 2 seconds.     Coloration: Skin is not cyanotic, jaundiced or pale.     Findings: No rash.  Neurological:     General: No focal deficit present.     Mental Status: He is alert and oriented to person, place, and time.     Cranial Nerves: Cranial nerves are intact.     Sensory: Sensation is intact.     Motor: Motor function is intact.     Coordination: Coordination is intact.     Gait: Gait abnormal.     Deep Tendon Reflexes: Reflexes are normal and symmetric.  Psychiatric:        Attention and Perception: Attention and perception normal.        Mood and Affect: Mood and affect normal.        Speech: Speech normal.        Behavior: Behavior normal. Behavior is cooperative.        Thought Content: Thought content normal.        Cognition and Memory: Cognition and memory normal.        Judgment: Judgment normal.     Results for orders placed or performed in visit on 08/29/19  Thyroid Panel With TSH  Result Value Ref Range   TSH 3.360 0.450 - 4.500 uIU/mL   T4, Total 6.1 4.5 -  12.0 ug/dL   T3 Uptake Ratio 32 24 - 39 %   Free Thyroxine Index 2.0 1.2 - 4.9  Lipid panel  Result Value Ref Range   Cholesterol, Total 202 (H) 100 - 199 mg/dL   Triglycerides 133 0 - 149 mg/dL   HDL 36 (L) >39 mg/dL   VLDL Cholesterol Cal 24 5 - 40 mg/dL   LDL Chol Calc (NIH) 142 (H) 0 - 99 mg/dL   Chol/HDL Ratio 5.6 (H) 0.0 - 5.0 ratio  CBC with Differential/Platelet  Result Value Ref Range   WBC 6.1 3.4 - 10.8 x10E3/uL   RBC 5.02 4.14 - 5.80 x10E6/uL   Hemoglobin 16.7 13.0 - 17.7 g/dL   Hematocrit 49.0 37.5 - 51.0 %   MCV 98 (H) 79 - 97 fL   MCH 33.3 (H) 26.6 - 33.0  pg   MCHC 34.1 31.5 - 35.7 g/dL   RDW 12.8 11.6 - 15.4 %   Platelets 226 150 - 450 x10E3/uL   Neutrophils 56 Not Estab. %   Lymphs 29 Not Estab. %   Monocytes 10 Not Estab. %   Eos 4 Not Estab. %   Basos 1 Not Estab. %   Neutrophils Absolute 3.4 1.4 - 7.0 x10E3/uL   Lymphocytes Absolute 1.8 0.7 - 3.1 x10E3/uL   Monocytes Absolute 0.6 0.1 - 0.9 x10E3/uL   EOS (ABSOLUTE) 0.2 0.0 - 0.4 x10E3/uL   Basophils Absolute 0.1 0.0 - 0.2 x10E3/uL   Immature Granulocytes 0 Not Estab. %   Immature Grans (Abs) 0.0 0.0 - 0.1 x10E3/uL  CMP14+EGFR  Result Value Ref Range   Glucose 104 (H) 65 - 99 mg/dL   BUN 15 8 - 27 mg/dL   Creatinine, Ser 0.91 0.76 - 1.27 mg/dL   GFR calc non Af Amer 78 >59 mL/min/1.73   GFR calc Af Amer 90 >59 mL/min/1.73   BUN/Creatinine Ratio 16 10 - 24   Sodium 140 134 - 144 mmol/L   Potassium 4.0 3.5 - 5.2 mmol/L   Chloride 98 96 - 106 mmol/L   CO2 26 20 - 29 mmol/L   Calcium 9.6 8.6 - 10.2 mg/dL   Total Protein 7.3 6.0 - 8.5 g/dL   Albumin 4.5 3.6 - 4.6 g/dL   Globulin, Total 2.8 1.5 - 4.5 g/dL   Albumin/Globulin Ratio 1.6 1.2 - 2.2   Bilirubin Total 0.8 0.0 - 1.2 mg/dL   Alkaline Phosphatase 72 39 - 117 IU/L   AST 29 0 - 40 IU/L   ALT 15 0 - 44 IU/L     X-Ray: left shoulder : nondisplaced proximal humeral neck fracture. Preliminary x-ray reading by Monia Pouch, FNP-C, [facility].    Pertinent labs & imaging results that were available during my care of the patient were reviewed by me and considered in my medical decision making.  Assessment & Plan:  Mel was seen today for fall and left shoulder pain.  Diagnoses and all orders for this visit:  Fall, initial encounter Acute pain of left shoulder Humeral neck fracture on imaging.  -     DG Shoulder Left; Future  Closed 2-part nondisplaced fracture of surgical neck of left humerus, initial encounter Nondisplaced humeral neck fracture. Sling applied in office. Appointment with orthopedics scheduled for tomorrow at 0930. Symptomatic care and pain management with tylenol discussed in detail. Follow up with ortho as scheduled.  -     Ambulatory referral to Orthopedic Surgery     Continue all other maintenance medications.  Follow up plan: Will see Dr. French Ana tomorrow at 40 at [place], [place] office.   Continue healthy lifestyle choices, including diet (rich in fruits, vegetables, and lean proteins, and low in salt and simple carbohydrates) and exercise (at least 30 minutes of moderate physical activity daily).  Educational handout given for humeral fracture treated with immobilization  The above assessment and management plan was discussed with the patient. The patient verbalized understanding of and has agreed to the management plan. Patient is aware to call the clinic if they develop any new symptoms or if symptoms persist or worsen. Patient is aware when to return to the clinic for a follow-up visit. Patient educated on when it is appropriate to go to the emergency department.   Monia Pouch, FNP-C [facility] [phone]

## 2019-09-15 DIAGNOSIS — S42295A Other nondisplaced fracture of upper end of left humerus, initial encounter for closed fracture: Secondary | ICD-10-CM | POA: Diagnosis not present

## 2019-10-13 DIAGNOSIS — S42292D Other displaced fracture of upper end of left humerus, subsequent encounter for fracture with routine healing: Secondary | ICD-10-CM | POA: Diagnosis not present

## 2019-11-24 DIAGNOSIS — S42292D Other displaced fracture of upper end of left humerus, subsequent encounter for fracture with routine healing: Secondary | ICD-10-CM | POA: Diagnosis not present

## 2019-11-29 ENCOUNTER — Other Ambulatory Visit: Payer: Medicare Other

## 2019-11-29 DIAGNOSIS — E785 Hyperlipidemia, unspecified: Secondary | ICD-10-CM | POA: Diagnosis not present

## 2019-11-29 DIAGNOSIS — E034 Atrophy of thyroid (acquired): Secondary | ICD-10-CM | POA: Diagnosis not present

## 2019-11-30 LAB — CMP14+EGFR
ALT: 14 IU/L (ref 0–44)
AST: 26 IU/L (ref 0–40)
Albumin/Globulin Ratio: 1.7 (ref 1.2–2.2)
Albumin: 4.5 g/dL (ref 3.6–4.6)
Alkaline Phosphatase: 72 IU/L (ref 39–117)
BUN/Creatinine Ratio: 18 (ref 10–24)
BUN: 16 mg/dL (ref 8–27)
Bilirubin Total: 0.8 mg/dL (ref 0.0–1.2)
CO2: 28 mmol/L (ref 20–29)
Calcium: 9.3 mg/dL (ref 8.6–10.2)
Chloride: 99 mmol/L (ref 96–106)
Creatinine, Ser: 0.87 mg/dL (ref 0.76–1.27)
GFR calc Af Amer: 93 mL/min/{1.73_m2} (ref >59)
GFR calc non Af Amer: 80 mL/min/{1.73_m2} (ref >59)
Globulin, Total: 2.6 g/dL (ref 1.5–4.5)
Glucose: 104 mg/dL — ABNORMAL HIGH (ref 65–99)
Potassium: 3.5 mmol/L (ref 3.5–5.2)
Sodium: 141 mmol/L (ref 134–144)
Total Protein: 7.1 g/dL (ref 6.0–8.5)

## 2019-11-30 LAB — THYROID PANEL WITH TSH
Free Thyroxine Index: 1.9 (ref 1.2–4.9)
T3 Uptake Ratio: 31 % (ref 24–39)
T4, Total: 6 ug/dL (ref 4.5–12.0)
TSH: 2.57 u[IU]/mL (ref 0.450–4.500)

## 2019-11-30 LAB — LIPID PANEL
Chol/HDL Ratio: 5.4 ratio — ABNORMAL HIGH (ref 0.0–5.0)
Cholesterol, Total: 206 mg/dL — ABNORMAL HIGH (ref 100–199)
HDL: 38 mg/dL — ABNORMAL LOW (ref >39)
LDL Chol Calc (NIH): 146 mg/dL — ABNORMAL HIGH (ref 0–99)
Triglycerides: 123 mg/dL (ref 0–149)
VLDL Cholesterol Cal: 22 mg/dL (ref 5–40)

## 2019-12-01 ENCOUNTER — Ambulatory Visit (INDEPENDENT_AMBULATORY_CARE_PROVIDER_SITE_OTHER): Payer: Medicare Other | Admitting: Nurse Practitioner

## 2019-12-01 ENCOUNTER — Encounter: Payer: Self-pay | Admitting: Nurse Practitioner

## 2019-12-01 VITALS — BP 130/70 | HR 70 | Temp 97.7°F | Resp 20 | Ht 66.0 in | Wt 156.0 lb

## 2019-12-01 DIAGNOSIS — I10 Essential (primary) hypertension: Secondary | ICD-10-CM

## 2019-12-01 DIAGNOSIS — G10 Huntington's disease: Secondary | ICD-10-CM | POA: Diagnosis not present

## 2019-12-01 DIAGNOSIS — E034 Atrophy of thyroid (acquired): Secondary | ICD-10-CM

## 2019-12-01 DIAGNOSIS — E785 Hyperlipidemia, unspecified: Secondary | ICD-10-CM

## 2019-12-01 DIAGNOSIS — N400 Enlarged prostate without lower urinary tract symptoms: Secondary | ICD-10-CM | POA: Diagnosis not present

## 2019-12-01 DIAGNOSIS — Z6827 Body mass index (BMI) 27.0-27.9, adult: Secondary | ICD-10-CM

## 2019-12-01 DIAGNOSIS — I4891 Unspecified atrial fibrillation: Secondary | ICD-10-CM | POA: Insufficient documentation

## 2019-12-01 DIAGNOSIS — K5730 Diverticulosis of large intestine without perforation or abscess without bleeding: Secondary | ICD-10-CM

## 2019-12-01 DIAGNOSIS — F3342 Major depressive disorder, recurrent, in full remission: Secondary | ICD-10-CM

## 2019-12-01 DIAGNOSIS — I480 Paroxysmal atrial fibrillation: Secondary | ICD-10-CM

## 2019-12-01 DIAGNOSIS — R413 Other amnesia: Secondary | ICD-10-CM

## 2019-12-01 DIAGNOSIS — I499 Cardiac arrhythmia, unspecified: Secondary | ICD-10-CM

## 2019-12-01 DIAGNOSIS — K219 Gastro-esophageal reflux disease without esophagitis: Secondary | ICD-10-CM | POA: Diagnosis not present

## 2019-12-01 DIAGNOSIS — R269 Unspecified abnormalities of gait and mobility: Secondary | ICD-10-CM

## 2019-12-01 MED ORDER — LEVOTHYROXINE SODIUM 50 MCG PO TABS: 50.0000 ug | ORAL_TABLET | Freq: Every day | ORAL | 1 refills | Status: DC

## 2019-12-01 MED ORDER — HALOPERIDOL 1 MG PO TABS: 1.5000 mg | ORAL_TABLET | Freq: Two times a day (BID) | ORAL | 1 refills | Status: DC

## 2019-12-01 MED ORDER — OMEPRAZOLE 20 MG PO CPDR: 20.0000 mg | DELAYED_RELEASE_CAPSULE | Freq: Every day | ORAL | 1 refills | Status: DC

## 2019-12-01 MED ORDER — LISINOPRIL-HYDROCHLOROTHIAZIDE 20-12.5 MG PO TABS: 1.0000 | ORAL_TABLET | Freq: Every day | ORAL | 1 refills | Status: DC

## 2019-12-01 MED ORDER — CITALOPRAM HYDROBROMIDE 20 MG PO TABS: 20.0000 mg | ORAL_TABLET | Freq: Every day | ORAL | 1 refills | Status: DC

## 2019-12-01 NOTE — Patient Instructions (Signed)

## 2019-12-01 NOTE — Progress Notes (Signed)
Subjective:    Patient ID: Sergio Baker, male    DOB: 1934-08-25, 85 y.o.   MRN: [id number]   Chief Complaint: No chief complaint on file.    HPI:  1. Gastroesophageal reflux disease, unspecified whether esophagitis present Takes prilosec daily and is keeping heartburn under control.  2. Hypothyroidism due to acquired atrophy of thyroid No problems that he is aware of. Lab Results  Component Value Date   TSH 2.570 11/29/2019    3. Huntington's chorea ([place]) Gait has gotten a little worse but is otherwise stable. Sees Dr. Jannifer Franklin every year.  4. Benign prostatic hyperplasia without lower urinary tract symptoms No problems with urination. Lab Results  Component Value Date   PSA1 5.3 (H) 05/08/2019   PSA1 5.5 (H) 04/28/2018   PSA1 5.3 (H) 03/12/2017   PSA 4.8 (H) 01/18/2014    5. Diverticulosis of large intestine without hemorrhage No problems at this time.  6. Hyperlipidemia with target LDL less than 100 Not really watching diet or exercising. Lab Results  Component Value Date   CHOL 206 (H) 11/29/2019   HDL 38 (L) 11/29/2019   LDLCALC 146 (H) 11/29/2019   TRIG 123 11/29/2019   CHOLHDL 5.4 (H) 11/29/2019    7. Recurrent major depressive disorder, in full remission ([place]) No symptoms of depression at this time. Celexa is working well. PHQ9 SCORE ONLY 09/14/2019 09/01/2019 07/27/2019  Score 0 0 0    8. BMI 27.0-27.9,adult Has lost 4 pounds since last visit. BMI Readings from Last 3 Encounters:  09/14/19 25.82 kg/m  09/01/19 25.99 kg/m  07/27/19 25.66 kg/m   Wt Readings from Last 3 Encounters:  09/14/19 160 lb (72.6 kg)  09/01/19 161 lb (73 kg)  07/27/19 159 lb (72.1 kg)    9. Memory deficits About the same  10. Abnormality of gait Having a little more difficulty with walking but no recent falls.  11. Essential hypertension Checks BP at home and it is usually good. Tries to watch sodium in diet. Denies chest pain, sob, headaches.  Outpatient  Encounter Medications as of 12/01/2019  Medication Sig  . acetaminophen (TYLENOL) 650 MG CR tablet Take 1,300 mg by mouth daily as needed for pain.  . Aloe Vera 25 MG CAPS Take 25 mg by mouth daily.  Marland Kitchen aspirin EC 81 MG tablet Take 81 mg by mouth daily.  Marland Kitchen CALCIUM CITRATE PO Take 1 tablet by mouth daily.  . Cholecalciferol (VITAMIN D3) 2000 UNITS capsule Take 2,000 Units by mouth daily.  . citalopram (CELEXA) 20 MG tablet Take 1 tablet (20 mg total) by mouth daily.  . Glucosamine-Chondroit-Vit C-Mn (GLUCOSAMINE CHONDR 1500 COMPLX PO) Take 1 tablet by mouth daily.  . haloperidol (HALDOL) 1 MG tablet Take 1.5 tablets (1.5 mg total) by mouth 2 (two) times daily.  Marland Kitchen levothyroxine (SYNTHROID) 50 MCG tablet Take 1 tablet (50 mcg total) by mouth daily before breakfast.  . lisinopril-hydrochlorothiazide (ZESTORETIC) 20-12.5 MG tablet Take 1 tablet by mouth daily.  . Methylsulfonylmethane (MSM) 1000 MG CAPS Take 1,000 mg by mouth daily.  . Multiple Vitamin (MULTIVITAMIN WITH MINERALS) TABS Take 1 tablet by mouth daily. For Men 50+  . Omega-3 Fatty Acids (FISH OIL PO) Take 1 tablet by mouth daily.  Marland Kitchen omeprazole (PRILOSEC) 20 MG capsule Take 1 capsule (20 mg total) by mouth daily.   No facility-administered encounter medications on file as of 12/01/2019.    Past Surgical History:  Procedure Laterality Date  . APPENDECTOMY    .  BIOPSY  07/19/2017   Procedure: BIOPSY;  Surgeon: Daneil Dolin, MD;  [facility];  Service: Endoscopy;;  colon  . BRAIN SURGERY    . CATARACT EXTRACTION W/PHACO Left 05/07/2017   Procedure: CATARACT EXTRACTION PHACO AND INTRAOCULAR LENS PLACEMENT (IOC);  Surgeon: Baruch Goldmann, MD;  Location: [facility];  Service: Ophthalmology;  Laterality: Left;  CDE: 10.49  . CATARACT EXTRACTION W/PHACO Right 06/04/2017   Procedure: CATARACT EXTRACTION PHACO AND INTRAOCULAR LENS PLACEMENT RIGHT EYE;  Surgeon: Baruch Goldmann, MD;  Location: [facility];  Service: Ophthalmology;   Laterality: Right;  CDE: 7.07  . COLONOSCOPY  02/2010   patchy erythema, minimal granularity in distal rectum, left-sided diverticula, ileal diverticulum.  Bx benign. next TCS 02/2012  . COLONOSCOPY  03/24/2012   Dr. Gala Romney- inflammatory changes of the rectum and colon consistent with history of U/C. bx= benign colonic mucosa  . COLONOSCOPY N/A 10/05/2014   RMR: abnormal rectal and sigmoid mucosa as described above status post segmental biopsy. active colitis sigmoid colon.  . COLONOSCOPY WITH PROPOFOL N/A 07/19/2017   Procedure: COLONOSCOPY WITH PROPOFOL;  Surgeon: Daneil Dolin, MD;  [facility];  Service: Endoscopy;  Laterality: N/A;  7:30am  . cyst removed from urinary bladder    . ESOPHAGOGASTRODUODENOSCOPY  04/2008   schatzki ring, s/p ED, hh  . LUMBAR FUSION    . SHOULDER ARTHROSCOPY W/ ROTATOR CUFF REPAIR Bilateral   . TONSILLECTOMY      Family History  Problem Relation Age of Onset  . Lung cancer Mother 49       deceased  . Other Brother        Posttraumatic stress disorder  . Cancer Brother        skin cancer  . Cancer Sister        thyroid  . Arthritis Sister   . SIDS Brother   . Huntington's disease Daughter   . Colon cancer Neg Hx     New complaints: None  Social history: Lives with wife.  Controlled substance contract: N/A   Review of Systems  Constitutional: Negative.   HENT: Negative.   Eyes: Negative.   Respiratory: Negative.   Cardiovascular: Negative.   Gastrointestinal: Negative.   Endocrine: Negative.   Genitourinary: Negative.   Musculoskeletal: Positive for gait problem.  Skin: Negative.   Allergic/Immunologic: Negative.   Hematological: Negative.   Psychiatric/Behavioral: Negative.        Objective:   Physical Exam Vitals and nursing note reviewed.  HENT:     Head: Normocephalic.     Right Ear: Tympanic membrane normal.     Left Ear: Tympanic membrane normal.     Nose: Nose normal.     Mouth/Throat:     Mouth: Mucous  membranes are moist.     Pharynx: Oropharynx is clear.  Eyes:     Conjunctiva/sclera: Conjunctivae normal.  Cardiovascular:     Rate and Rhythm: Normal rate. Rhythm irregular.     Pulses: Normal pulses.     Heart sounds: Normal heart sounds.  Pulmonary:     Effort: Pulmonary effort is normal.     Breath sounds: Normal breath sounds.  Abdominal:     General: Bowel sounds are normal.     Palpations: Abdomen is soft.  Musculoskeletal:        General: Normal range of motion.     Cervical back: Normal range of motion.  Skin:    General: Skin is warm and dry.  Capillary Refill: Capillary refill takes less than 2 seconds.  Neurological:     Mental Status: He is alert and oriented to person, place, and time.     Gait: Gait abnormal (slow and steady).     Comments: Jerking movements  Psychiatric:        Mood and Affect: Mood normal.        Behavior: Behavior normal.    BP 130/70   Pulse 70   Temp 97.7 F (36.5 C) (Temporal)   Resp 20   Ht 5' 6"  (1.676 m)   Wt 156 lb (70.8 kg)   BMI 25.18 kg/m   EKG- Atrial fib    CHF- 0 HTN-1 Age>70-1 Diabetes-0 Hx stroke (2)-0 Vascular disease-1 Age >65-1 Sex- male-0 Total 4     Assessment & Plan:  Sergio Baker comes in today with chief complaint of No chief complaint on file.   Diagnosis and orders addressed:  1. Gastroesophageal reflux disease, unspecified whether esophagitis present Avoid spicy foods Do not eat 2 hours prior to bedtime  2. Hypothyroidism due to acquired atrophy of thyroid - levothyroxine (SYNTHROID) 50 MCG tablet; Take 1 tablet (50 mcg total) by mouth daily before breakfast.  Dispense: 90 tablet; Refill: 1  3. Huntington's chorea ([place]) Continue to follow up with Dr. Jannifer Franklin - haloperidol (HALDOL) 1 MG tablet; Take 1.5 tablets (1.5 mg total) by mouth 2 (two) times daily.  Dispense: 270 tablet; Refill: 1  4. Benign prostatic hyperplasia without lower urinary tract symptoms  5. Diverticulosis of  large intestine without hemorrhage  6. Hyperlipidemia with target LDL less than 100 Low fat/low cholesterol diet  7. Recurrent major depressive disorder, in full remission ([place]) Stress management - citalopram (CELEXA) 20 MG tablet; Take 1 tablet (20 mg total) by mouth daily.  Dispense: 90 tablet; Refill: 1  8. BMI 27.0-27.9,adult Discussed diet and exercise for person with BMI >25 Will recheck weight in 3-6 months  9. Memory deficits  10. Abnormality of gait  11. Irregular heart beat Referral to cardiology for abnormal EKG Take ASA 81 mg PO daily - EKG 12-Lead  12. Gastroesophageal reflux disease Avoid spicy foods Do not eat 2 hours prior to bedtime - omeprazole (PRILOSEC) 20 MG capsule; Take 1 capsule (20 mg total) by mouth daily.  Dispense: 90 capsule; Refill: 1  13. Essential hypertension Low sodium diet zestoretic 20-12.5 mg PO daily   Labs pending Health Maintenance reviewed Diet and exercise encouraged  Follow up plan: 3 months   Mary-Margaret Hassell Done, FNP

## 2019-12-04 ENCOUNTER — Ambulatory Visit (INDEPENDENT_AMBULATORY_CARE_PROVIDER_SITE_OTHER): Payer: Medicare Other | Admitting: *Deleted

## 2019-12-04 DIAGNOSIS — Z0000 Encounter for general adult medical examination without abnormal findings: Secondary | ICD-10-CM

## 2019-12-04 NOTE — Patient Instructions (Signed)
[facility] Maintenance Summary and Written Plan of Care  Sergio Baker ,  Thank you for allowing me to perform your Medicare Annual Wellness Visit and for your ongoing commitment to your health.   Health Maintenance & Immunization History Health Maintenance  Topic Date Due  . TETANUS/TDAP  11/30/2020 (Originally 01/26/2018)  . INFLUENZA VACCINE  03/17/2020  . COVID-19 Vaccine  Completed  . PNA vac Low Risk Adult  Completed   Immunization History  Administered Date(s) Administered  . Fluad Quad(high Dose 65+) 04/28/2019  . Influenza, High Dose Seasonal PF 05/12/2017, 05/17/2018  . Influenza,inj,Quad PF,6+ Mos 05/14/2014, 05/16/2015, 05/07/2016  . Moderna SARS-COVID-2 Vaccination 09/21/2019, 10/20/2019  . Pneumococcal Conjugate-13 10/25/2014  . Pneumococcal Polysaccharide-23 05/18/2007    These are the patient goals that we discussed: Goals Addressed            This Visit's Progress   . Exercise 150 minutes per week (moderate activity)       12/04/2019 AWV Goal: Exercise for General Health   Patient will verbalize understanding of the benefits of increased physical activity:  Exercising regularly is important. It will improve your overall fitness, flexibility, and endurance.  Regular exercise also will improve your overall health. It can help you control your weight, reduce stress, and improve your bone density.  Over the next year, patient will increase physical activity as tolerated with a goal of at least 150 minutes of moderate physical activity per week.   You can tell that you are exercising at a moderate intensity if your heart starts beating faster and you start breathing faster but can still hold a conversation.  Moderate-intensity exercise ideas include:  Walking 1 mile (1.6 km) in about 15 minutes  Biking  Hiking  Golfing  Dancing  Water aerobics  Patient will verbalize understanding of everyday activities that increase  physical activity by providing examples like the following: ? Yard work, such as: ? Pushing a Conservation officer, nature ? Raking and bagging leaves ? Washing your car ? Pushing a stroller ? Shoveling snow ? Gardening ? Washing windows or floors  Patient will be able to explain general safety guidelines for exercising:   Before you start a new exercise program, talk with your health care provider.  Do not exercise so much that you hurt yourself, feel dizzy, or get very short of breath.  Wear comfortable clothes and wear shoes with good support.  Drink plenty of water while you exercise to prevent dehydration or heat stroke.  Work out until your breathing and your heartbeat get faster.     . Have 3 meals a day       12/04/2019 AWV Goal: Improved Nutrition/Diet  . Patient will verbalize understanding that diet plays an important role in overall health and that a poor diet is a risk factor for many chronic medical conditions.  . Over the next year, patient will improve self management of their diet by incorporating better variety, less frequent dining out, decreased fat intake, fewer sweetened foods & beverages, increased physical activity, improved protein intake, better food choices, adequate fluid intake (at least 6 cups of fluid per day), and watch portion sizes/amount of food eaten at one time. . Patient will utilize available community resources to help with food acquisition if needed (ex: food pantries, [address], etc) . Patient will work with nutrition specialist if a referral was made         This is a list of Health Maintenance Items that  are overdue or due now: There are no preventive care reminders to display for this patient.   Orders/Referrals Placed Today: No orders of the defined types were placed in this encounter.  (Contact our referral department at [phone] if you have not spoken with someone about your referral appointment within the next 5 days)    Follow-up  Plan Follow up with Mary-Margaret Hassell Done, FNP as scheduled on 03/15/2020

## 2019-12-04 NOTE — Progress Notes (Signed)
MEDICARE ANNUAL WELLNESS VISIT  12/04/2019  Telephone Visit Disclaimer This Medicare AWV was conducted by telephone due to national recommendations for restrictions regarding the COVID-19 Pandemic (e.g. social distancing).  I verified, using two identifiers, that I am speaking with Sergio Baker or their authorized healthcare agent. I discussed the limitations, risks, security, and privacy concerns of performing an evaluation and management service by telephone and the potential availability of an in-person appointment in the future. The patient expressed understanding and agreed to proceed.   Subjective:  Sergio Baker is a 85 y.o. male patient of Sergio Baker, [place] who had a Medicare Annual Wellness Visit today via telephone. Sergio Baker is Retired and lives with their spouse. he has 1 child. he reports that he is not socially active and does not interact with friends/family regularly. he is not physically active and enjoys sleeping.  Patient Care Team: Sergio Pretty, FNP as PCP - General (Nurse Practitioner) Sergio Dolin, MD (Gastroenterology)  Advanced Directives 12/04/2019 06/14/2018 07/19/2017 07/14/2017 06/04/2017 05/07/2017 05/03/2017  Does Patient Have a Medical Advance Directive? Yes Yes No No No Yes Yes  Type of Advance Directive Living will;Healthcare Power of Sergio Baker;Living will - - - Sergio Baker;Living will Sergio Baker;Living will  Does patient want to make changes to medical advance directive? No - Patient declined No - Patient declined - - - - -  Copy of [place] in Chart? No - copy requested No - copy requested - - - No - copy requested No - copy requested  Would patient like information on creating a medical advance directive? - - No - Patient declined No - Patient declined No - Patient declined - -    Hospital Utilization Over the Past 12 Months: # of hospitalizations or  ER visits: 0 # of surgeries: 0  Review of Systems    Patient reports that his overall health is unchanged compared to last year.  History obtained from chart review and the patient General ROS: negative  Patient Reported Readings (BP, Pulse, CBG, Weight, etc) none  Pain Assessment Pain : No/denies pain     Current Medications & Allergies (verified) Allergies as of 12/04/2019   No Known Allergies     Medication List       Accurate as of December 04, 2019  2:54 PM. If you have any questions, ask your nurse or doctor.        acetaminophen 650 MG CR tablet Commonly known as: TYLENOL Take 1,300 mg by mouth daily as needed for pain.   Aloe Vera 25 MG Caps Take 25 mg by mouth daily.   aspirin EC 81 MG tablet Take 81 mg by mouth daily.   CALCIUM CITRATE PO Take 1 tablet by mouth daily.   citalopram 20 MG tablet Commonly known as: CELEXA Take 1 tablet (20 mg total) by mouth daily.   FISH OIL PO Take 1 tablet by mouth daily.   GLUCOSAMINE CHONDR 1500 COMPLX PO Take 1 tablet by mouth daily.   haloperidol 1 MG tablet Commonly known as: HALDOL Take 1.5 tablets (1.5 mg total) by mouth 2 (two) times daily.   levothyroxine 50 MCG tablet Commonly known as: SYNTHROID Take 1 tablet (50 mcg total) by mouth daily before breakfast.   lisinopril-hydrochlorothiazide 20-12.5 MG tablet Commonly known as: Zestoretic Take 1 tablet by mouth daily.   MSM 1000 MG Caps Take 1,000 mg by mouth daily.   multivitamin  with minerals Tabs tablet Take 1 tablet by mouth daily. For Men 50+   omeprazole 20 MG capsule Commonly known as: PRILOSEC Take 1 capsule (20 mg total) by mouth daily.   Vitamin D3 50 MCG (2000 UT) capsule Take 2,000 Units by mouth daily.       History (reviewed): Past Medical History:  Diagnosis Date  . A-fib (Sergio Baker)   . Abnormality of gait   . Arthritis   . Depression   . Diverticula of colon   . Dysrhythmia    AFib  . Enlarged prostate   . GERD  (gastroesophageal reflux disease)   . Hearing loss   . Hemorrhoids   . Hiatal hernia    small  . HOH (hard of hearing)   . Huntington's disease ([place]) 2010  . Hyperlipidemia   . Hypothyroidism   . Joint pain   . Memory deficits 01/17/2014  . 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  . Schatzki's ring   . Ulcerative colitis ([place])    left side   Past Surgical History:  Procedure Laterality Date  . APPENDECTOMY    . BIOPSY  07/19/2017   Procedure: BIOPSY;  Surgeon: Sergio Dolin, MD;  Location: [facility];  Service: Endoscopy;;  colon  . BRAIN SURGERY    . CATARACT EXTRACTION W/PHACO Left 05/07/2017   Procedure: CATARACT EXTRACTION PHACO AND INTRAOCULAR LENS PLACEMENT (IOC);  Surgeon: Sergio Goldmann, MD;  Location: [facility] ORS;  Service: Ophthalmology;  Laterality: Left;  CDE: 10.49  . CATARACT EXTRACTION W/PHACO Right 06/04/2017   Procedure: CATARACT EXTRACTION PHACO AND INTRAOCULAR LENS PLACEMENT RIGHT EYE;  Surgeon: Sergio Goldmann, MD;  Location: [facility];  Service: Ophthalmology;  Laterality: Right;  CDE: 7.07  . COLONOSCOPY  02/2010   patchy erythema, minimal granularity in distal rectum, left-sided diverticula, ileal diverticulum.  Bx benign. next TCS 02/2012  . COLONOSCOPY  03/24/2012   Dr. Gala Baker- inflammatory changes of the rectum and colon consistent with history of U/C. bx= benign colonic mucosa  . COLONOSCOPY N/A 10/05/2014   RMR: abnormal rectal and sigmoid mucosa as described above status post segmental biopsy. active colitis sigmoid colon.  . COLONOSCOPY WITH PROPOFOL N/A 07/19/2017   Procedure: COLONOSCOPY WITH PROPOFOL;  Surgeon: Sergio Dolin, MD;  [facility];  Service: Endoscopy;  Laterality: N/A;  7:30am  . cyst removed from urinary bladder    . ESOPHAGOGASTRODUODENOSCOPY  04/2008   schatzki ring, s/p ED, hh  . LUMBAR FUSION    . SHOULDER ARTHROSCOPY W/ ROTATOR CUFF REPAIR Bilateral   . TONSILLECTOMY     Family History  Problem Relation  Age of Onset  . Lung cancer Mother 41       deceased  . Other Brother        Posttraumatic stress disorder  . Cancer Brother        skin cancer  . Cancer Sister        thyroid  . Arthritis Sister   . SIDS Brother   . Huntington's disease Daughter   . Colon cancer Neg Hx    Social History   Socioeconomic History  . Marital status: Married    Spouse name: Sergio Baker  . Number of children: 1  . Years of education: 12th  . Highest education level: 12th grade  Occupational History  . Occupation: retired Furniture conservator/restorer  . Occupation: works as Administrator, arts at [employer]  Tobacco Use  . Smoking status: Former Smoker    Packs/day:  0.25    Years: 8.00    Pack years: 2.00    Types: Cigarettes    Quit date: 05/03/1973    Years since quitting: 46.6  . Smokeless tobacco: Never Used  . Tobacco comment: Quit 1970's   Substance and Sexual Activity  . Alcohol use: No    Alcohol/week: 0.0 standard drinks  . Drug use: No  . Sexual activity: Not Currently    Birth control/protection: None  Other Topics Concern  . Not on file  Social History Narrative   Patient lives at home with his wife.    Social Determinants of Health   Financial Resource Strain: Low Risk   . Difficulty of Paying Living Expenses: Not hard at all  Food Insecurity: No Food Insecurity  . Worried About Charity fundraiser in the Last Year: Never true  . Ran Out of Food in the Last Year: Never true  Transportation Needs: No Transportation Needs  . Lack of Transportation (Medical): No  . Lack of Transportation (Non-Medical): No  Physical Activity: Inactive  . Days of Exercise per Week: 0 days  . Minutes of Exercise per Session: 0 min  Stress: No Stress Concern Present  . Feeling of Stress : Not at all  Social Connections: Somewhat Isolated  . Frequency of Communication with Friends and Family: Never  . Frequency of Social Gatherings with Friends and Family: Once a week  . Attends Religious Services: More than 4 times per  year  . Active Member of Clubs or Organizations: No  . Attends Archivist Meetings: Never  . Marital Status: Married    Activities of Daily Living In your present state of health, do you have any difficulty performing the following activities: 12/04/2019  Hearing? Y  Vision? N  Difficulty concentrating or making decisions? N  Walking or climbing stairs? Y  Dressing or bathing? N  Doing errands, shopping? Y  Preparing Food and eating ? N  Using the Toilet? N  In the past six months, have you accidently leaked urine? N  Do you have problems with loss of bowel control? N  Managing your Medications? N  Managing your Finances? N  Housekeeping or managing your Housekeeping? N  Some recent data might be hidden    Patient Education/ Literacy How often do you need to have someone help you when you read instructions, pamphlets, or other written materials from your doctor or pharmacy?: 1 - Never What is the last grade level you completed in school?: 12th Grade  Exercise Current Exercise Habits: The patient does not participate in regular exercise at present, Exercise limited by: orthopedic condition(s)  Diet Patient reports consuming 3 meals a day and 2 snack(s) a day Patient reports that his primary diet is: Regular Patient reports that she does have regular access to food.   Depression Screen PHQ 2/9 Scores 12/04/2019 12/01/2019 09/14/2019 09/01/2019 07/27/2019 05/24/2019 02/21/2019  PHQ - 2 Score 0 0 0 0 0 0 0  PHQ- 9 Score - - - - - - -     Fall Risk Fall Risk  12/04/2019 12/01/2019 09/14/2019 09/01/2019 07/27/2019  Falls in the past year? 1 0 1 0 0  Number falls in past yr: 1 - 0 - -  Injury with Fall? 1 - 1 - -  Comment - - - - -  Risk for fall due to : Impaired balance/gait;Impaired mobility;History of fall(s) - Impaired mobility;Impaired balance/gait - -  Follow up Falls evaluation completed - Education provided - -  Objective:  Sergio Baker seemed alert and  oriented and he participated appropriately during our telephone visit.  Blood Pressure Weight BMI  BP Readings from Last 3 Encounters:  12/01/19 130/70  09/14/19 (!) 141/78  09/01/19 127/73   Wt Readings from Last 3 Encounters:  12/01/19 156 lb (70.8 kg)  09/14/19 160 lb (72.6 kg)  09/01/19 161 lb (73 kg)   BMI Readings from Last 1 Encounters:  12/01/19 25.18 kg/m    *Unable to obtain current vital signs, weight, and BMI due to telephone visit type  Hearing/Vision  . Saurabh did  seem to have difficulty with hearing/understanding during the telephone conversation . Reports that he has had a formal eye exam by an eye care professional within the past year . Reports that he has had a formal hearing evaluation within the past year *Unable to fully assess hearing and vision during telephone visit type  Cognitive Function: 6CIT Screen 12/04/2019  What Year? 0 points  What month? 0 points  What time? 0 points  Count back from 20 0 points  Months in reverse 2 points  Repeat phrase 2 points  Total Score 4   (Normal:0-7, Significant for Dysfunction: >8)  Normal Cognitive Function Screening: Yes   Immunization & Health Maintenance Record Immunization History  Administered Date(s) Administered  . Fluad Quad(high Dose 65+) 04/28/2019  . Influenza, High Dose Seasonal PF 05/12/2017, 05/17/2018  . Influenza,inj,Quad PF,6+ Mos 05/14/2014, 05/16/2015, 05/07/2016  . Moderna SARS-COVID-2 Vaccination 09/21/2019, 10/20/2019  . Pneumococcal Conjugate-13 10/25/2014  . Pneumococcal Polysaccharide-23 05/18/2007    Health Maintenance  Topic Date Due  . TETANUS/TDAP  11/30/2020 (Originally 01/26/2018)  . INFLUENZA VACCINE  03/17/2020  . COVID-19 Vaccine  Completed  . PNA vac Low Risk Adult  Completed       Assessment  This is a routine wellness examination for [employer].  Health Maintenance: Due or Overdue There are no preventive care reminders to display for this  patient.  Sergio Baker does not need a referral for [employer] Assistance: Care Management:   no Social Work:    no Prescription Assistance:  no Nutrition/Diabetes Education:  no   Plan:  Personalized Goals Goals Addressed            This Visit's Progress   . Exercise 150 minutes per week (moderate activity)       12/04/2019 AWV Goal: Exercise for General Health   Patient will verbalize understanding of the benefits of increased physical activity:  Exercising regularly is important. It will improve your overall fitness, flexibility, and endurance.  Regular exercise also will improve your overall health. It can help you control your weight, reduce stress, and improve your bone density.  Over the next year, patient will increase physical activity as tolerated with a goal of at least 150 minutes of moderate physical activity per week.   You can tell that you are exercising at a moderate intensity if your heart starts beating faster and you start breathing faster but can still hold a conversation.  Moderate-intensity exercise ideas include:  Walking 1 mile (1.6 km) in about 15 minutes  Biking  Hiking  Golfing  Dancing  Water aerobics  Patient will verbalize understanding of everyday activities that increase physical activity by providing examples like the following: ? Yard work, such as: ? Pushing a Conservation officer, nature ? Raking and bagging leaves ? Washing your car ? Pushing a stroller ? Shoveling snow ? Gardening ? Washing windows or floors  Patient  will be able to explain general safety guidelines for exercising:   Before you start a new exercise program, talk with your health care provider.  Do not exercise so much that you hurt yourself, feel dizzy, or get very short of breath.  Wear comfortable clothes and wear shoes with good support.  Drink plenty of water while you exercise to prevent dehydration or heat stroke.  Work out until your breathing and your  heartbeat get faster.     . Have 3 meals a day       12/04/2019 AWV Goal: Improved Nutrition/Diet  . Patient will verbalize understanding that diet plays an important role in overall health and that a poor diet is a risk factor for many chronic medical conditions.  . Over the next year, patient will improve self management of their diet by incorporating better variety, less frequent dining out, decreased fat intake, fewer sweetened foods & beverages, increased physical activity, improved protein intake, better food choices, adequate fluid intake (at least 6 cups of fluid per day), and watch portion sizes/amount of food eaten at one time. . Patient will utilize available community resources to help with food acquisition if needed (ex: food pantries, [address], etc) . Patient will work with nutrition specialist if a referral was made       Personalized Health Maintenance & Screening Recommendations  Up to date  Lung Cancer Screening Recommended: no (Low Dose CT Chest recommended if Age 16-80 years, 30 pack-year currently smoking OR have quit w/in past 15 years) Hepatitis C Screening recommended: no HIV Screening recommended: no  Advanced Directives: Written information was not prepared per patient's request.  Referrals & Orders No orders of the defined types were placed in this encounter.   Follow-up Plan . Follow-up with Sergio Pretty, FNP as planned    I have personally reviewed and noted the following in the patient's chart:   . Medical and social history . Use of alcohol, tobacco or illicit drugs  . Current medications and supplements . Functional ability and status . Nutritional status . Physical activity . Advanced directives . List of other physicians . Hospitalizations, surgeries, and ER visits in previous 12 months . Vitals . Screenings to include cognitive, depression, and falls . Referrals and appointments  In addition, I have reviewed and discussed with  Sergio Baker certain preventive protocols, quality metrics, and best practice recommendations. A written personalized care plan for preventive services as well as general preventive health recommendations is available and can be mailed to the patient at his request.      Wardell Heath, LPN  01/15/5614    AVS printed and mailed to patient

## 2019-12-25 ENCOUNTER — Encounter: Payer: Self-pay | Admitting: Cardiology

## 2019-12-25 NOTE — Progress Notes (Signed)
Cardiology Office Note  Date: 12/26/2019   ID: Sergio Baker, DOB 11/26/1934, MRN [id number]  PCP:  Chevis Pretty, FNP  Cardiologist:  Rozann Lesches, MD Electrophysiologist:  None   Chief Complaint  Patient presents with  . Atrial Fibrillation    History of Present Illness: Sergio Baker is an 85 y.o. male referred for cardiology consultation by Ms. Hassell Done NP due to recent documentation of atrial fibrillation by ECG in April.  He is here today with a family member.  He does not report any sense of palpitations or chest pain.  He has Huntington's chorea, follows with neurology on medication.  He has had falls, states that he broke his left shoulder back in January.  He is using a cane today.  Chart review finds prior history of atrial fibrillation including by ECGs going back to 2016.  I personally reviewed his follow-up ECG today in the office which also shows atrial fibrillation, controlled heart rate.  CHA2DS2-VASc score is at least 2.  We discussed risk of stroke associated with atrial fibrillation, also bleeding risk with anticoagulation versus aspirin.  After discussion today, his decision is to continue on aspirin.  At this point he does not require any AV nodal blockers for heart rate control.  We did discuss getting an echocardiogram to assess cardiac structure and function.  Past Medical History:  Diagnosis Date  . Abnormality of gait   . Arthritis   . Atrial fibrillation ([place])   . Depression   . Diverticula of colon   . Enlarged prostate   . GERD (gastroesophageal reflux disease)   . Hearing loss   . Hemorrhoids   . Hiatal hernia   . Huntington's disease ([place]) 2010  . Hyperlipidemia   . Hypothyroidism   . Memory deficits 01/17/2014  . 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 Schatzki's ring   . Ulcerative colitis [facility])     Past Surgical History:  Procedure Laterality Date  . APPENDECTOMY    . BIOPSY  07/19/2017   Procedure:  BIOPSY;  Surgeon: Daneil Dolin, MD;  Location: [facility] ENDO SUITE;  Service: Endoscopy;;  colon  . BRAIN SURGERY    . CATARACT EXTRACTION W/PHACO Left 05/07/2017   Procedure: CATARACT EXTRACTION PHACO AND INTRAOCULAR LENS PLACEMENT (IOC);  Surgeon: Baruch Goldmann, MD;  Location: [facility] ORS;  Service: Ophthalmology;  Laterality: Left;  CDE: 10.49  . CATARACT EXTRACTION W/PHACO Right 06/04/2017   Procedure: CATARACT EXTRACTION PHACO AND INTRAOCULAR LENS PLACEMENT RIGHT EYE;  Surgeon: Baruch Goldmann, MD;  Location: [facility] ORS;  Service: Ophthalmology;  Laterality: Right;  CDE: 7.07  . COLONOSCOPY  02/2010   patchy erythema, minimal granularity in distal rectum, left-sided diverticula, ileal diverticulum.  Bx benign. next TCS 02/2012  . COLONOSCOPY  03/24/2012   Dr. Gala Romney- inflammatory changes of the rectum and colon consistent with history of U/C. bx= benign colonic mucosa  . COLONOSCOPY N/A 10/05/2014   RMR: abnormal rectal and sigmoid mucosa as described above status post segmental biopsy. active colitis sigmoid colon.  . COLONOSCOPY WITH PROPOFOL N/A 07/19/2017   Procedure: COLONOSCOPY WITH PROPOFOL;  Surgeon: Daneil Dolin, MD;  Location: [facility] ENDO SUITE;  Service: Endoscopy;  Laterality: N/A;  7:30am  . cyst removed from urinary bladder    . ESOPHAGOGASTRODUODENOSCOPY  04/2008   schatzki ring, s/p ED, hh  . LUMBAR FUSION    . SHOULDER ARTHROSCOPY W/ ROTATOR CUFF REPAIR Bilateral   . TONSILLECTOMY  Current Outpatient Medications  Medication Sig Dispense Refill  . acetaminophen (TYLENOL) 650 MG CR tablet Take 1,300 mg by mouth daily as needed for pain.    . Aloe Vera 25 MG CAPS Take 25 mg by mouth daily.    Marland Kitchen aspirin EC 81 MG tablet Take 81 mg by mouth daily.    Marland Kitchen CALCIUM CITRATE PO Take 1 tablet by mouth daily.    . Cholecalciferol (VITAMIN D3) 2000 UNITS capsule Take 2,000 Units by mouth daily.    . citalopram (CELEXA) 20 MG tablet Take 1 tablet (20 mg total) by mouth daily. 90 tablet 1  .  Glucosamine-Chondroit-Vit C-Mn (GLUCOSAMINE CHONDR 1500 COMPLX PO) Take 1 tablet by mouth daily.    . haloperidol (HALDOL) 1 MG tablet Take 1.5 tablets (1.5 mg total) by mouth 2 (two) times daily. 270 tablet 1  . levothyroxine (SYNTHROID) 50 MCG tablet Take 1 tablet (50 mcg total) by mouth daily before breakfast. 90 tablet 1  . lisinopril-hydrochlorothiazide (ZESTORETIC) 20-12.5 MG tablet Take 1 tablet by mouth daily. 90 tablet 1  . Methylsulfonylmethane (MSM) 1000 MG CAPS Take 1,000 mg by mouth daily.    . Multiple Vitamin (MULTIVITAMIN WITH MINERALS) TABS Take 1 tablet by mouth daily. For Men 50+    . Omega-3 Fatty Acids (FISH OIL PO) Take 1 tablet by mouth daily.    Marland Kitchen omeprazole (PRILOSEC) 20 MG capsule Take 1 capsule (20 mg total) by mouth daily. 90 capsule 1   No current facility-administered medications for this visit.   Allergies:  Patient has no known allergies.   Social History: The patient  reports that he quit smoking about 46 years ago. His smoking use included cigarettes. He has a 2.00 pack-year smoking history. He has never used smokeless tobacco. He reports that he does not drink alcohol or use drugs.   Family History: The patient's family history includes Arthritis in his sister; Cancer in his brother and sister; Huntington's disease in his daughter; Lung cancer (age of onset: 69) in his mother; Other in his brother; SIDS in his brother.   ROS:   Tremors, weakness, difficulty with gait.  Physical Exam: VS:  BP (!) 158/84   Pulse 68   Ht 5' 6"  (1.676 m)   Wt 155 lb 9.6 oz (70.6 kg)   SpO2 98%   BMI 25.11 kg/m , BMI Body mass index is 25.11 kg/m.  Wt Readings from Last 3 Encounters:  12/26/19 155 lb 9.6 oz (70.6 kg)  12/01/19 156 lb (70.8 kg)  09/14/19 160 lb (72.6 kg)    General: Elderly male, no distress.  Uses a cane. HEENT: Conjunctiva and lids normal, wearing a mask. Neck: Supple, no elevated JVP or carotid bruits, no thyromegaly. Lungs: Clear to auscultation,  nonlabored breathing at rest. Cardiac: Irregularly irregular, no S3, soft systolic murmur. Abdomen: Soft, nontender, bowel sounds present. Extremities: No pitting edema, distal pulses 2+. Skin: Warm and dry. Musculoskeletal: No kyphosis. Neuropsychiatric: Alert and oriented x3, hearing loss.  Tremors and rhythmic movements of the feet and hands noted.  ECG:  An ECG dated 12/01/2019 was personally reviewed today and demonstrated:  Atrial fibrillation with low voltage, poor R wave progression rule out old anterior infarct pattern.  Recent Labwork: 08/29/2019: Hemoglobin 16.7; Platelets 226 11/29/2019: ALT 14; AST 26; BUN 16; Creatinine, Ser 0.87; Potassium 3.5; Sodium 141; TSH 2.570     Component Value Date/Time   CHOL 206 (H) 11/29/2019 1051   CHOL 143 12/26/2012 0907   TRIG 123  11/29/2019 1051   TRIG 122 10/25/2014 1056   TRIG 170 (H) 12/26/2012 0907   HDL 38 (L) 11/29/2019 1051   HDL 37 (L) 10/25/2014 1056   HDL 40 12/26/2012 0907   CHOLHDL 5.4 (H) 11/29/2019 1051   LDLCALC 146 (H) 11/29/2019 1051   LDLCALC 109 (H) 01/18/2014 1027   LDLCALC 69 12/26/2012 0100    Other Studies Reviewed Today:  No prior cardiac testing for review.  Assessment and Plan:  1.  Atrial fibrillation, most likely longstanding and present by ECGs going back to 2016.  CHA2DS2-VASc score is at least 2.  We discussed risks and benefits of anticoagulation versus aspirin as it relates to stroke prophylaxis.  After discussion today he prefers to stay on aspirin.  Heart rate is controlled and he is asymptomatic, no indication for AV nodal blocker.  We will obtain an echocardiogram to assess cardiac structure and function.  2.  Essential hypertension, on Zestoretic with follow-up by PCP.  Medication Adjustments/Labs and Tests Ordered: Current medicines are reviewed at length with the patient today.  Concerns regarding medicines are outlined above.   Tests Ordered: Orders Placed This Encounter  Procedures  .  EKG 12-Lead    Medication Changes: No orders of the defined types were placed in this encounter.   Disposition:  Follow up 1 year in the [place] office.  Signed, Satira Sark, MD, [facility] 12/26/2019 11:30 AM    [facility] [place] [facility] [address], [place], [place] [address] Phone: [phone]; Fax: [phone]

## 2019-12-26 ENCOUNTER — Ambulatory Visit (INDEPENDENT_AMBULATORY_CARE_PROVIDER_SITE_OTHER): Payer: Medicare Other | Admitting: Cardiology

## 2019-12-26 ENCOUNTER — Encounter: Payer: Self-pay | Admitting: Cardiology

## 2019-12-26 ENCOUNTER — Other Ambulatory Visit: Payer: Self-pay

## 2019-12-26 VITALS — BP 158/84 | HR 68 | Ht 66.0 in | Wt 155.6 lb

## 2019-12-26 DIAGNOSIS — I4821 Permanent atrial fibrillation: Secondary | ICD-10-CM | POA: Diagnosis not present

## 2019-12-26 DIAGNOSIS — I10 Essential (primary) hypertension: Secondary | ICD-10-CM

## 2019-12-26 NOTE — Patient Instructions (Addendum)
Medication Instructions:   Your physician recommends that you continue on your current medications as directed. Please refer to the Current Medication list given to you today.  Labwork:  NONE  Testing/Procedures: Your physician has requested that you have an echocardiogram. Echocardiography is a painless test that uses sound waves to create images of your heart. It provides your doctor with information about the size and shape of your heart and how well your heart's chambers and valves are working. This procedure takes approximately one hour. There are no restrictions for this procedure.  Follow-Up:  Your physician recommends that you schedule a follow-up appointment in: 1 year (office). You will receive a reminder letter in the mail in about 10 months reminding you to call and schedule your appointment. If you don't receive this letter, please contact our office.  Any Other Special Instructions Will Be Listed Below (If Applicable).  If you need a refill on your cardiac medications before your next appointment, please call your pharmacy.

## 2019-12-27 ENCOUNTER — Ambulatory Visit (INDEPENDENT_AMBULATORY_CARE_PROVIDER_SITE_OTHER): Payer: Medicare Other

## 2019-12-27 ENCOUNTER — Telehealth: Payer: Self-pay | Admitting: *Deleted

## 2019-12-27 DIAGNOSIS — I4821 Permanent atrial fibrillation: Secondary | ICD-10-CM | POA: Diagnosis not present

## 2019-12-27 NOTE — Telephone Encounter (Signed)
-----   Message from Satira Sark, MD sent at 12/27/2019  1:48 PM EDT ----- Results reviewed.  LVEF is normal at 60 to 65%.  Right ventricle is moderately enlarged with increased RV pressure and also a small secundum ASD (congenital).  Would continue aspirin as we discussed in clinic recently given plans for conservative management.

## 2019-12-27 NOTE — Telephone Encounter (Signed)
Patient's wife informed. Copy sent to PCP

## 2020-01-10 ENCOUNTER — Ambulatory Visit (INDEPENDENT_AMBULATORY_CARE_PROVIDER_SITE_OTHER): Payer: Medicare Other | Admitting: *Deleted

## 2020-01-10 DIAGNOSIS — I10 Essential (primary) hypertension: Secondary | ICD-10-CM

## 2020-01-10 DIAGNOSIS — E034 Atrophy of thyroid (acquired): Secondary | ICD-10-CM | POA: Diagnosis not present

## 2020-01-10 DIAGNOSIS — G10 Huntington's disease: Secondary | ICD-10-CM

## 2020-01-18 NOTE — Chronic Care Management (AMB) (Signed)
Chronic Care Management   Initial Visit Note  01/10/2020 Name: Sergio Baker MRN: [id number] DOB: 1934-12-02  Referred by: Chevis Pretty, FNP Reason for referral : Chronic Care Management (Initial Visit)   OZIL STETTLER is a 85 y.o. year old male who is a primary care patient of Chevis Pretty, [place]. The CCM team was consulted for assistance with chronic disease management and care coordination needs related to HTN, Afib, ulcerative colitis, hypothyroidism, Huntington's Chorea.  Review of patient status, including review of consultants reports, relevant laboratory and other test results, and collaboration with appropriate care team members and the patient's provider was performed as part of comprehensive patient evaluation and provision of chronic care management services.    Subjective: I spoke with Sergio Baker's wife, Sergio Baker, by telephone regarding management of his chronic medical conditions and Sergio Baker was present at the time of the call.   SDOH (Social Determinants of Health) assessments performed: Yes See Care Plan activities for detailed interventions related to SDOH     Objective: Outpatient Encounter Medications as of 01/10/2020  Medication Sig  . acetaminophen (TYLENOL) 650 MG CR tablet Take 1,300 mg by mouth daily as needed for pain.  . Aloe Vera 25 MG CAPS Take 25 mg by mouth daily.  Marland Kitchen aspirin EC 81 MG tablet Take 81 mg by mouth daily.  Marland Kitchen CALCIUM CITRATE PO Take 1 tablet by mouth daily.  . Cholecalciferol (VITAMIN D3) 2000 UNITS capsule Take 2,000 Units by mouth daily.  . citalopram (CELEXA) 20 MG tablet Take 1 tablet (20 mg total) by mouth daily.  . Glucosamine-Chondroit-Vit C-Mn (GLUCOSAMINE CHONDR 1500 COMPLX PO) Take 1 tablet by mouth daily.  . haloperidol (HALDOL) 1 MG tablet Take 1.5 tablets (1.5 mg total) by mouth 2 (two) times daily.  Marland Kitchen levothyroxine (SYNTHROID) 50 MCG tablet Take 1 tablet (50 mcg total) by mouth daily before breakfast.  .  lisinopril-hydrochlorothiazide (ZESTORETIC) 20-12.5 MG tablet Take 1 tablet by mouth daily.  . Methylsulfonylmethane (MSM) 1000 MG CAPS Take 1,000 mg by mouth daily.  . Multiple Vitamin (MULTIVITAMIN WITH MINERALS) TABS Take 1 tablet by mouth daily. For Men 50+  . Omega-3 Fatty Acids (FISH OIL PO) Take 1 tablet by mouth daily.  Marland Kitchen omeprazole (PRILOSEC) 20 MG capsule Take 1 capsule (20 mg total) by mouth daily.   No facility-administered encounter medications on file as of 01/10/2020.    BP Readings from Last 3 Encounters:  12/26/19 (!) 158/84  12/01/19 130/70  09/14/19 (!) 141/78   Lab Results  Component Value Date   TSH 2.570 11/29/2019     RN Care Plan   . Chronic Disease Management Needs       CARE PLAN ENTRY (see longtitudinal plan of care for additional care plan information)  Current Barriers:  . Chronic Disease Management support, education, and care coordination needs related to HTN, Afib, ulcerative colitis, hypothyroidism, Huntington's Chorea  Clinical Goal(s) related to HTN, Afib, ulcerative colitis, hypothyroidism, Huntington's Chorea:  Over the next 45 days, patient will:  . Work with the care management team to address educational, disease management, and care coordination needs  . Begin or continue self health monitoring activities as directed today Measure and record blood pressure 4 times per week . Call provider office for new or worsened signs and symptoms Blood pressure findings outside established parameters . Call care management team with questions or concerns . Verbalize basic understanding of patient centered plan of care established today  Interventions related to HTN, Afib, ulcerative colitis, hypothyroidism,  Huntington's Chorea:  . Evaluation of current treatment plans and patient's adherence to plan as established by provider . Assessed patient understanding of disease states . Assessed patient's education and care coordination needs . Provided  disease specific education to patient  . Collaborated with appropriate clinical care team members regarding patient needs  Patient Self Care Activities related to HTN, Afib, ulcerative colitis, hypothyroidism, Huntington's Chorea:  . Patient is unable to independently self-manage chronic health conditions  Initial goal documentation         Sergio Baker was given information about Chronic Care Management services today including:  1. CCM service includes personalized support from designated clinical staff supervised by his physician, including individualized plan of care and coordination with other care providers 2. 24/7 contact phone numbers for assistance for urgent and routine care needs. 3. Service will only be billed when office clinical staff spend 20 minutes or more in a month to coordinate care. 4. Only one practitioner may furnish and bill the service in a calendar month. 5. The patient may stop CCM services at any time (effective at the end of the month) by phone call to the office staff. 6. The patient will be responsible for cost sharing (co-pay) of up to 20% of the service fee (after annual deductible is met).  Patient agreed to services and verbal consent obtained.   Plan:   The care management team will reach out to the patient again over the next 45 days.   Chong Sicilian, BSN, RN-BC Embedded Chronic Care Manager [facility] / [place] [facility] Direct Dial: [phone]

## 2020-01-18 NOTE — Patient Instructions (Signed)
Visit Information  Goals Addressed            This Visit's Progress   . Chronic Disease Management Needs       CARE PLAN ENTRY (see longtitudinal plan of care for additional care plan information)  Current Barriers:  . Chronic Disease Management support, education, and care coordination needs related to HTN, Afib, ulcerative colitis, hypothyroidism, Huntington's Chorea  Clinical Goal(s) related to HTN, Afib, ulcerative colitis, hypothyroidism, Huntington's Chorea:  Over the next 45 days, patient will:  . Work with the care management team to address educational, disease management, and care coordination needs  . Begin or continue self health monitoring activities as directed today Measure and record blood pressure 4 times per week . Call provider office for new or worsened signs and symptoms Blood pressure findings outside established parameters . Call care management team with questions or concerns . Verbalize basic understanding of patient centered plan of care established today  Interventions related to HTN, Afib, ulcerative colitis, hypothyroidism, Huntington's Chorea:  . Evaluation of current treatment plans and patient's adherence to plan as established by provider . Assessed patient understanding of disease states . Assessed patient's education and care coordination needs . Provided disease specific education to patient  . Collaborated with appropriate clinical care team members regarding patient needs  Patient Self Care Activities related to HTN, Afib, ulcerative colitis, hypothyroidism, Huntington's Chorea:  . Patient is unable to independently self-manage chronic health conditions  Initial goal documentation        Sergio Baker was given information about Chronic Care Management services today including:  1. CCM service includes personalized support from designated clinical staff supervised by his physician, including individualized plan of care and coordination with  other care providers 2. 24/7 contact phone numbers for assistance for urgent and routine care needs. 3. Service will only be billed when office clinical staff spend 20 minutes or more in a month to coordinate care. 4. Only one practitioner may furnish and bill the service in a calendar month. 5. The patient may stop CCM services at any time (effective at the end of the month) by phone call to the office staff. 6. The patient will be responsible for cost sharing (co-pay) of up to 20% of the service fee (after annual deductible is met).  Patient agreed to services and verbal consent obtained.   The patient verbalized understanding of instructions provided today and declined a print copy of patient instruction materials.   The care management team will reach out to the patient again over the next 45 days.   Sergio Baker, BSN, RN-BC Embedded Chronic Care Manager [facility] / [place] [facility] Direct Dial: [phone]

## 2020-02-22 ENCOUNTER — Ambulatory Visit: Payer: Medicare Other | Admitting: *Deleted

## 2020-02-22 DIAGNOSIS — E034 Atrophy of thyroid (acquired): Secondary | ICD-10-CM

## 2020-02-22 DIAGNOSIS — I4821 Permanent atrial fibrillation: Secondary | ICD-10-CM

## 2020-02-22 DIAGNOSIS — G10 Huntington's disease: Secondary | ICD-10-CM

## 2020-02-26 NOTE — Chronic Care Management (AMB) (Signed)
Chronic Care Management   Follow Up Note   02/22/2020 Name: Sergio Baker MRN: [id number] DOB: 10/29/1934  Referred by: Chevis Pretty, FNP Reason for referral : Chronic Care Management (RN follow-up)   Sergio Baker is a 85 y.o. year old male who is a primary care patient of Chevis Pretty, [place]. The CCM team was consulted for assistance with chronic disease management and care coordination needs.    Review of patient status, including review of consultants reports, relevant laboratory and other test results, and collaboration with appropriate care team members and the patient's provider was performed as part of comprehensive patient evaluation and provision of chronic care management services.    SDOH (Social Determinants of Health) assessments performed: No See Care Plan activities for detailed interventions related to [facility])     Outpatient Encounter Medications as of 02/22/2020  Medication Sig  . acetaminophen (TYLENOL) 650 MG CR tablet Take 1,300 mg by mouth daily as needed for pain.  . Aloe Vera 25 MG CAPS Take 25 mg by mouth daily.  Marland Kitchen aspirin EC 81 MG tablet Take 81 mg by mouth daily.  Marland Kitchen CALCIUM CITRATE PO Take 1 tablet by mouth daily.  . Cholecalciferol (VITAMIN D3) 2000 UNITS capsule Take 2,000 Units by mouth daily.  . citalopram (CELEXA) 20 MG tablet Take 1 tablet (20 mg total) by mouth daily.  . Glucosamine-Chondroit-Vit C-Mn (GLUCOSAMINE CHONDR 1500 COMPLX PO) Take 1 tablet by mouth daily.  . haloperidol (HALDOL) 1 MG tablet Take 1.5 tablets (1.5 mg total) by mouth 2 (two) times daily.  Marland Kitchen levothyroxine (SYNTHROID) 50 MCG tablet Take 1 tablet (50 mcg total) by mouth daily before breakfast.  . lisinopril-hydrochlorothiazide (ZESTORETIC) 20-12.5 MG tablet Take 1 tablet by mouth daily.  . Methylsulfonylmethane (MSM) 1000 MG CAPS Take 1,000 mg by mouth daily.  . Multiple Vitamin (MULTIVITAMIN WITH MINERALS) TABS Take 1 tablet by mouth daily. For Men 50+  .  Omega-3 Fatty Acids (FISH OIL PO) Take 1 tablet by mouth daily.  Marland Kitchen omeprazole (PRILOSEC) 20 MG capsule Take 1 capsule (20 mg total) by mouth daily.   No facility-administered encounter medications on file as of 02/22/2020.     RN Care Plan   . Chronic Disease Management Needs   On track    CARE PLAN ENTRY (see longtitudinal plan of care for additional care plan information)  Current Barriers:  . Chronic Disease Management support, education, and care coordination needs related to HTN, Afib, ulcerative colitis, hypothyroidism, Huntington's Chorea  Clinical Goal(s) related to HTN, Afib, ulcerative colitis, hypothyroidism, Huntington's Chorea:  Over the next 45 days, patient will:  . Work with the care management team to address educational, disease management, and care coordination needs  . Begin or continue self health monitoring activities as directed today Measure and record blood pressure 4 times per week . Call provider office for new or worsened signs and symptoms Blood pressure findings outside established parameters . Call care management team with questions or concerns  Interventions related to HTN, Afib, ulcerative colitis, hypothyroidism, Huntington's Chorea:  . Evaluation of current treatment plans and patient's adherence to plan as established by provider . Talked with patient's wife by telephone . Assessed disease state and needs . Discussed completion of ADLs . Provided disease specific education to patient  . Collaborated with appropriate clinical care team members regarding patient needs . Chart reviewed including recent office notes and lab results  Patient Self Care Activities related to HTN, Afib, ulcerative colitis, hypothyroidism, Huntington's Chorea:  .  Patient is unable to independently self-manage chronic health conditions  Please see past updates related to this goal by clicking on the "Past Updates" button in the selected goal          Plan:   The care  management team will reach out to the patient again over the next 60 days.   Chong Sicilian, BSN, RN-BC Embedded Chronic Care Manager [facility] / [place] [facility] Direct Dial: [phone]

## 2020-02-26 NOTE — Patient Instructions (Signed)
Visit Information  Goals Addressed            This Visit's Progress   . Chronic Disease Management Needs   On track    CARE PLAN ENTRY (see longtitudinal plan of care for additional care plan information)  Current Barriers:  . Chronic Disease Management support, education, and care coordination needs related to HTN, Afib, ulcerative colitis, hypothyroidism, Huntington's Chorea  Clinical Goal(s) related to HTN, Afib, ulcerative colitis, hypothyroidism, Huntington's Chorea:  Over the next 45 days, patient will:  . Work with the care management team to address educational, disease management, and care coordination needs  . Begin or continue self health monitoring activities as directed today Measure and record blood pressure 4 times per week . Call provider office for new or worsened signs and symptoms Blood pressure findings outside established parameters . Call care management team with questions or concerns  Interventions related to HTN, Afib, ulcerative colitis, hypothyroidism, Huntington's Chorea:  . Evaluation of current treatment plans and patient's adherence to plan as established by provider . Talked with patient's wife by telephone . Assessed disease state and needs . Discussed completion of ADLs . Provided disease specific education to patient  . Collaborated with appropriate clinical care team members regarding patient needs . Chart reviewed including recent office notes and lab results  Patient Self Care Activities related to HTN, Afib, ulcerative colitis, hypothyroidism, Huntington's Chorea:  . Patient is unable to independently self-manage chronic health conditions  Please see past updates related to this goal by clicking on the "Past Updates" button in the selected goal         Patient verbalizes understanding of instructions provided today.   Follow-up Plan The care management team will reach out to the patient again over the next 60 days.   Chong Sicilian,  BSN, RN-BC Embedded Chronic Care Manager [facility] / [place] Management Direct Dial: [phone]

## 2020-03-11 ENCOUNTER — Other Ambulatory Visit: Payer: Medicare Other

## 2020-03-11 ENCOUNTER — Other Ambulatory Visit: Payer: Self-pay

## 2020-03-11 DIAGNOSIS — E785 Hyperlipidemia, unspecified: Secondary | ICD-10-CM

## 2020-03-11 DIAGNOSIS — I10 Essential (primary) hypertension: Secondary | ICD-10-CM

## 2020-03-12 LAB — CMP14+EGFR
ALT: 14 IU/L (ref 0–44)
AST: 26 IU/L (ref 0–40)
Albumin/Globulin Ratio: 1.5 (ref 1.2–2.2)
Albumin: 4.4 g/dL (ref 3.6–4.6)
Alkaline Phosphatase: 78 IU/L (ref 48–121)
BUN/Creatinine Ratio: 14 (ref 10–24)
BUN: 13 mg/dL (ref 8–27)
Bilirubin Total: 0.8 mg/dL (ref 0.0–1.2)
CO2: 29 mmol/L (ref 20–29)
Calcium: 9.9 mg/dL (ref 8.6–10.2)
Chloride: 98 mmol/L (ref 96–106)
Creatinine, Ser: 0.93 mg/dL (ref 0.76–1.27)
GFR calc Af Amer: 87 mL/min/{1.73_m2} (ref >59)
GFR calc non Af Amer: 76 mL/min/{1.73_m2} (ref >59)
Globulin, Total: 2.9 g/dL (ref 1.5–4.5)
Glucose: 105 mg/dL — ABNORMAL HIGH (ref 65–99)
Potassium: 4.2 mmol/L (ref 3.5–5.2)
Sodium: 142 mmol/L (ref 134–144)
Total Protein: 7.3 g/dL (ref 6.0–8.5)

## 2020-03-12 LAB — CBC WITH DIFFERENTIAL/PLATELET
Basophils Absolute: 0 10*3/uL (ref 0.0–0.2)
Basos: 1 %
EOS (ABSOLUTE): 0.2 10*3/uL (ref 0.0–0.4)
Eos: 4 %
Hematocrit: 46.8 % (ref 37.5–51.0)
Hemoglobin: 15.8 g/dL (ref 13.0–17.7)
Immature Grans (Abs): 0 10*3/uL (ref 0.0–0.1)
Immature Granulocytes: 0 %
Lymphocytes Absolute: 1.5 10*3/uL (ref 0.7–3.1)
Lymphs: 23 %
MCH: 32.5 pg (ref 26.6–33.0)
MCHC: 33.8 g/dL (ref 31.5–35.7)
MCV: 96 fL (ref 79–97)
Monocytes Absolute: 0.6 10*3/uL (ref 0.1–0.9)
Monocytes: 10 %
Neutrophils Absolute: 3.9 10*3/uL (ref 1.4–7.0)
Neutrophils: 62 %
Platelets: 217 10*3/uL (ref 150–450)
RBC: 4.86 x10E6/uL (ref 4.14–5.80)
RDW: 13.1 % (ref 11.6–15.4)
WBC: 6.3 10*3/uL (ref 3.4–10.8)

## 2020-03-12 LAB — LIPID PANEL
Chol/HDL Ratio: 5.3 ratio — ABNORMAL HIGH (ref 0.0–5.0)
Cholesterol, Total: 202 mg/dL — ABNORMAL HIGH (ref 100–199)
HDL: 38 mg/dL — ABNORMAL LOW (ref >39)
LDL Chol Calc (NIH): 146 mg/dL — ABNORMAL HIGH (ref 0–99)
Triglycerides: 101 mg/dL (ref 0–149)
VLDL Cholesterol Cal: 18 mg/dL (ref 5–40)

## 2020-03-15 ENCOUNTER — Ambulatory Visit (INDEPENDENT_AMBULATORY_CARE_PROVIDER_SITE_OTHER): Payer: Medicare Other | Admitting: Nurse Practitioner

## 2020-03-15 ENCOUNTER — Encounter: Payer: Self-pay | Admitting: Nurse Practitioner

## 2020-03-15 ENCOUNTER — Other Ambulatory Visit: Payer: Self-pay

## 2020-03-15 VITALS — BP 118/71 | HR 61 | Temp 97.4°F | Resp 20 | Ht 66.0 in | Wt 158.0 lb

## 2020-03-15 DIAGNOSIS — E034 Atrophy of thyroid (acquired): Secondary | ICD-10-CM | POA: Diagnosis not present

## 2020-03-15 DIAGNOSIS — Z6827 Body mass index (BMI) 27.0-27.9, adult: Secondary | ICD-10-CM

## 2020-03-15 DIAGNOSIS — I10 Essential (primary) hypertension: Secondary | ICD-10-CM | POA: Diagnosis not present

## 2020-03-15 DIAGNOSIS — I4821 Permanent atrial fibrillation: Secondary | ICD-10-CM | POA: Diagnosis not present

## 2020-03-15 DIAGNOSIS — R413 Other amnesia: Secondary | ICD-10-CM | POA: Diagnosis not present

## 2020-03-15 DIAGNOSIS — K5730 Diverticulosis of large intestine without perforation or abscess without bleeding: Secondary | ICD-10-CM

## 2020-03-15 DIAGNOSIS — E785 Hyperlipidemia, unspecified: Secondary | ICD-10-CM

## 2020-03-15 DIAGNOSIS — F3342 Major depressive disorder, recurrent, in full remission: Secondary | ICD-10-CM

## 2020-03-15 DIAGNOSIS — K219 Gastro-esophageal reflux disease without esophagitis: Secondary | ICD-10-CM | POA: Diagnosis not present

## 2020-03-15 MED ORDER — LISINOPRIL-HYDROCHLOROTHIAZIDE 20-12.5 MG PO TABS: 1.0000 | ORAL_TABLET | Freq: Every day | ORAL | 1 refills | Status: DC

## 2020-03-15 MED ORDER — OMEPRAZOLE 20 MG PO CPDR: 20.0000 mg | DELAYED_RELEASE_CAPSULE | Freq: Every day | ORAL | 1 refills | Status: DC

## 2020-03-15 MED ORDER — LEVOTHYROXINE SODIUM 50 MCG PO TABS: 50.0000 ug | ORAL_TABLET | Freq: Every day | ORAL | 1 refills | Status: DC

## 2020-03-15 MED ORDER — CITALOPRAM HYDROBROMIDE 20 MG PO TABS: 20.0000 mg | ORAL_TABLET | Freq: Every day | ORAL | 1 refills | Status: DC

## 2020-03-15 NOTE — Patient Instructions (Signed)

## 2020-03-15 NOTE — Progress Notes (Signed)
Subjective:    Patient ID: Sergio Baker, male    DOB: 05-29-1935, 85 y.o.   MRN: [id number]   Chief Complaint: Medical Management of Chronic Issues    HPI:  1. Essential hypertension Patient does check blood pressure at home, does not restrict dietary salt intake.  BP Readings from Last 3 Encounters:  03/15/20 118/71  12/26/19 (!) 158/84  12/01/19 130/70     2. Permanent atrial fibrillation (HCC) No complaints with palpitations. Does follow-up with a cardiologist regularly.   3. Gastroesophageal reflux disease, unspecified whether esophagitis present Does eat spicy foods, does not complain of any problems.   4. Hypothyroidism due to acquired atrophy of thyroid Does take thyroid medication every morning, does sleep often.  Lab Results  Component Value Date   TSH 2.570 11/29/2019     5. Memory deficits Has not had memory deficits recently.   6. Diverticulosis of large intestine without hemorrhage Does eat seeded foods and nuts.   7. Hyperlipidemia with target LDL less than 100 Does eat fatty foods, does not exercise.  Lab Results  Component Value Date   CHOL 202 (H) 03/11/2020   HDL 38 (L) 03/11/2020   LDLCALC 146 (H) 03/11/2020   TRIG 101 03/11/2020   CHOLHDL 5.3 (H) 03/11/2020    8. Recurrent major depressive disorder, in full remission ([place]) No problems with depression recently.  Depression screen [facility] 2/9 03/15/2020 12/04/2019 12/01/2019  Decreased Interest 0 0 0  Down, Depressed, Hopeless 0 0 0  PHQ - 2 Score 0 0 0  Altered sleeping 0 - -  Tired, decreased energy 0 - -  Change in appetite 0 - -  Feeling bad or failure about yourself  0 - -  Trouble concentrating 0 - -  Moving slowly or fidgety/restless 0 - -  Suicidal thoughts 0 - -  PHQ-9 Score 0 - -  Difficult doing work/chores Not difficult at all - -  Some recent data might be hidden     9. BMI 27.0-27.9,adult Does not exercise and does not watch diet.  Wt Readings from Last 3 Encounters:   03/15/20 158 lb (71.7 kg)  12/26/19 155 lb 9.6 oz (70.6 kg)  12/01/19 156 lb (70.8 kg)   BMI Readings from Last 3 Encounters:  03/15/20 25.50 kg/m  12/26/19 25.11 kg/m  12/01/19 25.18 kg/m      Outpatient Encounter Medications as of 03/15/2020  Medication Sig  . acetaminophen (TYLENOL) 650 MG CR tablet Take 1,300 mg by mouth daily as needed for pain.  . Aloe Vera 25 MG CAPS Take 25 mg by mouth daily.  Marland Kitchen aspirin EC 81 MG tablet Take 81 mg by mouth daily.  Marland Kitchen CALCIUM CITRATE PO Take 1 tablet by mouth daily.  . Cholecalciferol (VITAMIN D3) 2000 UNITS capsule Take 2,000 Units by mouth daily.  . citalopram (CELEXA) 20 MG tablet Take 1 tablet (20 mg total) by mouth daily.  . Glucosamine-Chondroit-Vit C-Mn (GLUCOSAMINE CHONDR 1500 COMPLX PO) Take 1 tablet by mouth daily.  . haloperidol (HALDOL) 1 MG tablet Take 1.5 tablets (1.5 mg total) by mouth 2 (two) times daily.  Marland Kitchen levothyroxine (SYNTHROID) 50 MCG tablet Take 1 tablet (50 mcg total) by mouth daily before breakfast.  . lisinopril-hydrochlorothiazide (ZESTORETIC) 20-12.5 MG tablet Take 1 tablet by mouth daily.  . Methylsulfonylmethane (MSM) 1000 MG CAPS Take 1,000 mg by mouth daily.  . Multiple Vitamin (MULTIVITAMIN WITH MINERALS) TABS Take 1 tablet by mouth daily. For Men 50+  . Omega-3  Fatty Acids (FISH OIL PO) Take 1 tablet by mouth daily.  Marland Kitchen omeprazole (PRILOSEC) 20 MG capsule Take 1 capsule (20 mg total) by mouth daily.   No facility-administered encounter medications on file as of 03/15/2020.    Past Surgical History:  Procedure Laterality Date  . APPENDECTOMY    . BIOPSY  07/19/2017   Procedure: BIOPSY;  Surgeon: Daneil Dolin, MD;  [facility];  Service: Endoscopy;;  colon  . BRAIN SURGERY    . CATARACT EXTRACTION W/PHACO Left 05/07/2017   Procedure: CATARACT EXTRACTION PHACO AND INTRAOCULAR LENS PLACEMENT (IOC);  Surgeon: Baruch Goldmann, MD;  Location: [facility];  Service: Ophthalmology;  Laterality: Left;  CDE:  10.49  . CATARACT EXTRACTION W/PHACO Right 06/04/2017   Procedure: CATARACT EXTRACTION PHACO AND INTRAOCULAR LENS PLACEMENT RIGHT EYE;  Surgeon: Baruch Goldmann, MD;  Location: [facility];  Service: Ophthalmology;  Laterality: Right;  CDE: 7.07  . COLONOSCOPY  02/2010   patchy erythema, minimal granularity in distal rectum, left-sided diverticula, ileal diverticulum.  Bx benign. next TCS 02/2012  . COLONOSCOPY  03/24/2012   Dr. Gala Romney- inflammatory changes of the rectum and colon consistent with history of U/C. bx= benign colonic mucosa  . COLONOSCOPY N/A 10/05/2014   RMR: abnormal rectal and sigmoid mucosa as described above status post segmental biopsy. active colitis sigmoid colon.  . COLONOSCOPY WITH PROPOFOL N/A 07/19/2017   Procedure: COLONOSCOPY WITH PROPOFOL;  Surgeon: Daneil Dolin, MD;  [facility];  Service: Endoscopy;  Laterality: N/A;  7:30am  . cyst removed from urinary bladder    . ESOPHAGOGASTRODUODENOSCOPY  04/2008   schatzki ring, s/p ED, hh  . LUMBAR FUSION    . SHOULDER ARTHROSCOPY W/ ROTATOR CUFF REPAIR Bilateral   . TONSILLECTOMY      Family History  Problem Relation Age of Onset  . Lung cancer Mother 51       deceased  . Other Brother        Posttraumatic stress disorder  . Cancer Brother        skin cancer  . Cancer Sister        thyroid  . Arthritis Sister   . SIDS Brother   . Huntington's disease Daughter   . Colon cancer Neg Hx     New complaints: No new complaints.   Social history: Lives at home with wife, does not work.   Controlled substance contract: n/a     Review of Systems  Constitutional: Negative.   HENT: Negative.   Eyes: Negative.   Respiratory: Negative.   Cardiovascular: Negative.   Gastrointestinal: Negative.   Endocrine: Negative.   Genitourinary: Negative.   Musculoskeletal: Negative.   Skin: Negative.   Allergic/Immunologic: Negative.   Neurological: Negative.   Hematological: Negative.   Psychiatric/Behavioral:  Negative.        Objective:   Physical Exam Vitals and nursing note reviewed.  Constitutional:      Appearance: Normal appearance. He is normal weight.  HENT:     Head: Normocephalic and atraumatic.     Right Ear: Tympanic membrane, ear canal and external ear normal.     Left Ear: Tympanic membrane, ear canal and external ear normal.     Nose: Nose normal.     Mouth/Throat:     Mouth: Mucous membranes are moist.     Pharynx: Oropharynx is clear.  Eyes:     Extraocular Movements: Extraocular movements intact.     Conjunctiva/sclera: Conjunctivae normal.  Pupils: Pupils are equal, round, and reactive to light.  Cardiovascular:     Rate and Rhythm: Normal rate. Rhythm irregular.     Pulses: Normal pulses.     Heart sounds: Normal heart sounds.  Pulmonary:     Effort: Pulmonary effort is normal.     Breath sounds: Normal breath sounds.  Abdominal:     General: Abdomen is flat. Bowel sounds are normal.     Palpations: Abdomen is soft.  Genitourinary:    Penis: Normal.      Testes: Normal.     Prostate: Normal.     Rectum: Normal.  Musculoskeletal:        General: Normal range of motion.     Cervical back: Normal range of motion and neck supple.  Skin:    General: Skin is warm and dry.     Capillary Refill: Capillary refill takes less than 2 seconds.  Neurological:     General: No focal deficit present.     Mental Status: He is alert and oriented to person, place, and time. Mental status is at baseline.  Psychiatric:        Mood and Affect: Mood normal.        Behavior: Behavior normal.        Thought Content: Thought content normal.        Judgment: Judgment normal.      BP 118/71   Pulse 61   Temp (!) 97.4 F (36.3 C) (Temporal)   Resp 20   Ht 5' 6"  (1.676 m)   Wt 158 lb (71.7 kg)   SpO2 97%   BMI 25.50 kg/m      Assessment & Plan:  EATON FOLMAR comes in today with chief complaint of Medical Management of Chronic Issues   Diagnosis and orders  addressed:  1. Essential hypertension Patient encouraged to continue checking BP at home, encouraged to follow a sodium-restricted diet.   2. Permanent atrial fibrillation [facility]) Patient encouraged to continue follow-ups with cardiology.   3. Gastroesophageal reflux disease, unspecified whether esophagitis present Patient encouraged to avoid trigger foods and avoid laying down right after meals.   4. Hypothyroidism due to acquired atrophy of thyroid Patient encouraged to continue taking medication daily.   5. Memory deficits Patient encouraged to maintain current treatment regimen.   6. Diverticulosis of large intestine without hemorrhage Patient encouraged to avoid foods with seeds or nuts.   7. Hyperlipidemia with target LDL less than 100 Patient encouraged to exercise as tolerated and restrict dietary fat intake.   8. Recurrent major depressive disorder, in full remission [facility]) Patient encouraged to report thoughts of self harm.   9. BMI 27.0-27.9,adult Patient encouraged to weigh self regularly and to exercise as tolerated. Instructed about fat restriction.   Meds ordered this encounter  Medications  . lisinopril-hydrochlorothiazide (ZESTORETIC) 20-12.5 MG tablet    Sig: Take 1 tablet by mouth daily.    Dispense:  90 tablet    Refill:  1    Order Specific Question:   Supervising Provider    Answer:   Caryl Pina A [id number]  . omeprazole (PRILOSEC) 20 MG capsule    Sig: Take 1 capsule (20 mg total) by mouth daily.    Dispense:  90 capsule    Refill:  1    Order Specific Question:   Supervising Provider    Answer:   Caryl Pina A [id number]  . levothyroxine (SYNTHROID) 50 MCG tablet    Sig: Take 1 tablet (  50 mcg total) by mouth daily before breakfast.    Dispense:  90 tablet    Refill:  1    Order Specific Question:   Supervising Provider    Answer:   Caryl Pina A [id number]  . citalopram (CELEXA) 20 MG tablet    Sig: Take 1 tablet (20 mg total) by  mouth daily.    Dispense:  90 tablet    Refill:  1    Order Specific Question:   Supervising Provider    Answer:   Caryl Pina A [id number]    Labs pending Health Maintenance reviewed Diet and exercise encouraged  Follow up plan: Follow-up in 3 months    Mary-Margaret Hassell Done, [place], [place] Student

## 2020-05-03 ENCOUNTER — Ambulatory Visit (INDEPENDENT_AMBULATORY_CARE_PROVIDER_SITE_OTHER): Payer: Medicare Other

## 2020-05-03 ENCOUNTER — Other Ambulatory Visit: Payer: Self-pay

## 2020-05-03 DIAGNOSIS — Z23 Encounter for immunization: Secondary | ICD-10-CM

## 2020-06-05 ENCOUNTER — Other Ambulatory Visit: Payer: Self-pay

## 2020-06-05 ENCOUNTER — Ambulatory Visit (INDEPENDENT_AMBULATORY_CARE_PROVIDER_SITE_OTHER): Payer: Medicare Other

## 2020-06-05 ENCOUNTER — Encounter: Payer: Self-pay | Admitting: Family Medicine

## 2020-06-05 ENCOUNTER — Ambulatory Visit (INDEPENDENT_AMBULATORY_CARE_PROVIDER_SITE_OTHER): Payer: Medicare Other | Admitting: Family Medicine

## 2020-06-05 VITALS — BP 124/87 | HR 92 | Temp 96.3°F | Ht 66.0 in | Wt 158.0 lb

## 2020-06-05 DIAGNOSIS — M25512 Pain in left shoulder: Secondary | ICD-10-CM

## 2020-06-05 DIAGNOSIS — M19012 Primary osteoarthritis, left shoulder: Secondary | ICD-10-CM | POA: Diagnosis not present

## 2020-06-05 DIAGNOSIS — S40012A Contusion of left shoulder, initial encounter: Secondary | ICD-10-CM

## 2020-06-05 NOTE — Progress Notes (Addendum)
Chief Complaint  Patient presents with  . Fall  . Shoulder Pain    HPI  Patient presents today for fall last night. Landed on left side. Pt. Denies that he landed on the previously fractured shoulder.  At this point he has baseline amount of use although that is limited by his Huntington's chorea.  He is not having any pain.  PMH: Smoking status noted ROS: Per HPI  Objective: BP 124/87   Pulse 92   Temp (!) 96.3 F (35.7 C) (Temporal)   Ht 5' 6"  (1.676 m)   Wt 158 lb (71.7 kg)   BMI 25.50 kg/m  Gen: NAD, alert, cooperative with exam HEENT: NCAT, Ext: No edema, warm.  There is a limited range of motion at the left shoulder.  He cannot abduct above about 45 degrees and this involved shoulder elevation as well.  He has limited rotation of the shoulder as well.  Palpation of the shoulder joint reveals no tenderness.  The extremity is intact vascularly.  No numbness.  No weakness. Neuro: Alert and oriented, No gross deficits X-ray left shoulder: Old fracture with good calcification of the fracture line.  No acute injury of the area could be detected. Assessment and plan:  1. Contusion of left shoulder, initial encounter     Pt. Declined need for pain meds. He will work on home exercise program. Reassured that the injury did not cause a new fracture.  Orders Placed This Encounter  Procedures  . DG Shoulder Left    Standing Status:   Future    Number of Occurrences:   1    Standing Expiration Date:   06/05/2021    Order Specific Question:   Reason for Exam (SYMPTOM  OR DIAGNOSIS REQUIRED)    Answer:   left shldr pain    Order Specific Question:   Preferred imaging location?    Answer:   Internal    Follow up as needed.  Claretta Fraise, MD

## 2020-06-21 ENCOUNTER — Ambulatory Visit: Payer: Self-pay | Admitting: Nurse Practitioner

## 2020-07-17 DIAGNOSIS — C44529 Squamous cell carcinoma of skin of other part of trunk: Secondary | ICD-10-CM | POA: Diagnosis not present

## 2020-07-26 DIAGNOSIS — S42292D Other displaced fracture of upper end of left humerus, subsequent encounter for fracture with routine healing: Secondary | ICD-10-CM | POA: Diagnosis not present

## 2020-08-01 ENCOUNTER — Other Ambulatory Visit: Payer: Self-pay

## 2020-08-01 ENCOUNTER — Other Ambulatory Visit: Payer: Medicare Other

## 2020-08-01 DIAGNOSIS — E785 Hyperlipidemia, unspecified: Secondary | ICD-10-CM | POA: Diagnosis not present

## 2020-08-01 DIAGNOSIS — I10 Essential (primary) hypertension: Secondary | ICD-10-CM | POA: Diagnosis not present

## 2020-08-02 LAB — CBC WITH DIFFERENTIAL/PLATELET
Basophils Absolute: 0 10*3/uL (ref 0.0–0.2)
Basos: 0 %
EOS (ABSOLUTE): 0.4 10*3/uL (ref 0.0–0.4)
Eos: 4 %
Hematocrit: 47.7 % (ref 37.5–51.0)
Hemoglobin: 16.2 g/dL (ref 13.0–17.7)
Immature Grans (Abs): 0 10*3/uL (ref 0.0–0.1)
Immature Granulocytes: 0 %
Lymphocytes Absolute: 1.8 10*3/uL (ref 0.7–3.1)
Lymphs: 19 %
MCH: 33.1 pg — ABNORMAL HIGH (ref 26.6–33.0)
MCHC: 34 g/dL (ref 31.5–35.7)
MCV: 97 fL (ref 79–97)
Monocytes Absolute: 1 10*3/uL — ABNORMAL HIGH (ref 0.1–0.9)
Monocytes: 10 %
Neutrophils Absolute: 6.4 10*3/uL (ref 1.4–7.0)
Neutrophils: 67 %
Platelets: 245 10*3/uL (ref 150–450)
RBC: 4.9 x10E6/uL (ref 4.14–5.80)
RDW: 12.9 % (ref 11.6–15.4)
WBC: 9.5 10*3/uL (ref 3.4–10.8)

## 2020-08-02 LAB — CMP14+EGFR
ALT: 18 IU/L (ref 0–44)
AST: 23 IU/L (ref 0–40)
Albumin/Globulin Ratio: 1.8 (ref 1.2–2.2)
Albumin: 4.5 g/dL (ref 3.6–4.6)
Alkaline Phosphatase: 69 IU/L (ref 44–121)
BUN/Creatinine Ratio: 24 (ref 10–24)
BUN: 20 mg/dL (ref 8–27)
Bilirubin Total: 0.9 mg/dL (ref 0.0–1.2)
CO2: 30 mmol/L — ABNORMAL HIGH (ref 20–29)
Calcium: 9.7 mg/dL (ref 8.6–10.2)
Chloride: 95 mmol/L — ABNORMAL LOW (ref 96–106)
Creatinine, Ser: 0.82 mg/dL (ref 0.76–1.27)
GFR calc Af Amer: 95 mL/min/{1.73_m2} (ref >59)
GFR calc non Af Amer: 82 mL/min/{1.73_m2} (ref >59)
Globulin, Total: 2.5 g/dL (ref 1.5–4.5)
Glucose: 99 mg/dL (ref 65–99)
Potassium: 4 mmol/L (ref 3.5–5.2)
Sodium: 139 mmol/L (ref 134–144)
Total Protein: 7 g/dL (ref 6.0–8.5)

## 2020-08-02 LAB — LIPID PANEL
Chol/HDL Ratio: 4.6 ratio (ref 0.0–5.0)
Cholesterol, Total: 194 mg/dL (ref 100–199)
HDL: 42 mg/dL (ref >39)
LDL Chol Calc (NIH): 136 mg/dL — ABNORMAL HIGH (ref 0–99)
Triglycerides: 85 mg/dL (ref 0–149)
VLDL Cholesterol Cal: 16 mg/dL (ref 5–40)

## 2020-08-06 ENCOUNTER — Ambulatory Visit (INDEPENDENT_AMBULATORY_CARE_PROVIDER_SITE_OTHER): Payer: Medicare Other | Admitting: Nurse Practitioner

## 2020-08-06 ENCOUNTER — Encounter: Payer: Self-pay | Admitting: Nurse Practitioner

## 2020-08-06 ENCOUNTER — Other Ambulatory Visit: Payer: Self-pay

## 2020-08-06 VITALS — BP 139/79 | HR 63 | Temp 97.3°F | Resp 20 | Ht 66.0 in | Wt 157.0 lb

## 2020-08-06 DIAGNOSIS — K219 Gastro-esophageal reflux disease without esophagitis: Secondary | ICD-10-CM

## 2020-08-06 DIAGNOSIS — I4821 Permanent atrial fibrillation: Secondary | ICD-10-CM | POA: Diagnosis not present

## 2020-08-06 DIAGNOSIS — R413 Other amnesia: Secondary | ICD-10-CM

## 2020-08-06 DIAGNOSIS — N400 Enlarged prostate without lower urinary tract symptoms: Secondary | ICD-10-CM

## 2020-08-06 DIAGNOSIS — F3342 Major depressive disorder, recurrent, in full remission: Secondary | ICD-10-CM

## 2020-08-06 DIAGNOSIS — K5730 Diverticulosis of large intestine without perforation or abscess without bleeding: Secondary | ICD-10-CM

## 2020-08-06 DIAGNOSIS — E785 Hyperlipidemia, unspecified: Secondary | ICD-10-CM

## 2020-08-06 DIAGNOSIS — I10 Essential (primary) hypertension: Secondary | ICD-10-CM | POA: Diagnosis not present

## 2020-08-06 DIAGNOSIS — R269 Unspecified abnormalities of gait and mobility: Secondary | ICD-10-CM

## 2020-08-06 DIAGNOSIS — E034 Atrophy of thyroid (acquired): Secondary | ICD-10-CM

## 2020-08-06 DIAGNOSIS — Z6827 Body mass index (BMI) 27.0-27.9, adult: Secondary | ICD-10-CM

## 2020-08-06 DIAGNOSIS — G10 Huntington's disease: Secondary | ICD-10-CM

## 2020-08-06 MED ORDER — OMEPRAZOLE 20 MG PO CPDR: 20.0000 mg | DELAYED_RELEASE_CAPSULE | Freq: Every day | ORAL | 1 refills | Status: DC

## 2020-08-06 MED ORDER — LEVOTHYROXINE SODIUM 50 MCG PO TABS: 50.0000 ug | ORAL_TABLET | Freq: Every day | ORAL | 1 refills | Status: DC

## 2020-08-06 MED ORDER — HALOPERIDOL 1 MG PO TABS: 1.5000 mg | ORAL_TABLET | Freq: Two times a day (BID) | ORAL | 1 refills | Status: DC

## 2020-08-06 MED ORDER — CITALOPRAM HYDROBROMIDE 20 MG PO TABS: 20.0000 mg | ORAL_TABLET | Freq: Every day | ORAL | 1 refills | Status: DC

## 2020-08-06 MED ORDER — LISINOPRIL-HYDROCHLOROTHIAZIDE 20-12.5 MG PO TABS
1.0000 | ORAL_TABLET | Freq: Every day | ORAL | 1 refills | Status: DC
Start: 2020-08-06 — End: 2021-02-10

## 2020-08-06 NOTE — Patient Instructions (Signed)

## 2020-08-06 NOTE — Progress Notes (Signed)
Subjective:    Patient ID: Sergio Baker, male    DOB: 12-21-1934, 85 y.o.   MRN: [id number]   Chief Complaint: medical management of chronic issues     HPI:  1. Essential hypertension No c/o chest pain, sob or headache. Does not check blood pressure at home anymore. BP Readings from Last 3 Encounters:  06/05/20 124/87  03/15/20 118/71  12/26/19 (!) 158/84     2. Hyperlipidemia with target LDL less than 100 Does watch diet but is not able to do any exercise. Lab Results  Component Value Date   CHOL 194 08/01/2020   HDL 42 08/01/2020   LDLCALC 136 (H) 08/01/2020   TRIG 85 08/01/2020   CHOLHDL 4.6 08/01/2020     3. Permanent atrial fibrillation (HCC) Denies heart palpitations or heart racing.  4. Gastroesophageal reflux disease, unspecified whether esophagitis present Takes omeprazole daily and is doing well.  5. Hypothyroidism due to acquired atrophy of thyroid No problems that he is aware of. Lab Results  Component Value Date   TSH 2.570 11/29/2019     6. Recurrent major depressive disorder, in full remission [facility]) He takes celexa daily and that seems to work well for him.   7. Memory deficits His wife says she thinks he is maintaining right now.   8. Huntington's chorea ([place]) He sees neurology every 6 months. Is on haldol which has his jerking under pretty good control. His wife says that she has noticed that his speech is worsening. Hard for him to say what he wants to say.  9. Abnormality of gait Has had no recent falls but is unsteady at times when walking.  10. Diverticulosis of large intestine without hemorrhage No recent flare ups  11. Benign prostatic hyperplasia without lower urinary tract symptoms No problems voiding Lab Results  Component Value Date   PSA1 5.3 (H) 05/08/2019   PSA1 5.5 (H) 04/28/2018   PSA1 5.3 (H) 03/12/2017   PSA 4.8 (H) 01/18/2014      12. BMI 27.0-27.9,adult No recent weight changes Wt Readings from Last 3  Encounters:  08/06/20 157 lb (71.2 kg)  06/05/20 158 lb (71.7 kg)  03/15/20 158 lb (71.7 kg)    BMI Readings from Last 3 Encounters:  06/05/20 25.50 kg/m  03/15/20 25.50 kg/m  12/26/19 25.11 kg/m       Outpatient Encounter Medications as of 08/06/2020  Medication Sig  . acetaminophen (TYLENOL) 650 MG CR tablet Take 1,300 mg by mouth daily as needed for pain.  . Aloe Vera 25 MG CAPS Take 25 mg by mouth daily.  Marland Kitchen aspirin EC 81 MG tablet Take 81 mg by mouth daily.  Marland Kitchen CALCIUM CITRATE PO Take 1 tablet by mouth daily.  . Cholecalciferol (VITAMIN D3) 2000 UNITS capsule Take 2,000 Units by mouth daily.  . citalopram (CELEXA) 20 MG tablet Take 1 tablet (20 mg total) by mouth daily.  . Glucosamine-Chondroit-Vit C-Mn (GLUCOSAMINE CHONDR 1500 COMPLX PO) Take 1 tablet by mouth daily.  . haloperidol (HALDOL) 1 MG tablet Take 1.5 tablets (1.5 mg total) by mouth 2 (two) times daily.  Marland Kitchen levothyroxine (SYNTHROID) 50 MCG tablet Take 1 tablet (50 mcg total) by mouth daily before breakfast.  . lisinopril-hydrochlorothiazide (ZESTORETIC) 20-12.5 MG tablet Take 1 tablet by mouth daily.  . Methylsulfonylmethane (MSM) 1000 MG CAPS Take 1,000 mg by mouth daily.  . Multiple Vitamin (MULTIVITAMIN WITH MINERALS) TABS Take 1 tablet by mouth daily. For Men 50+  . Omega-3 Fatty Acids (FISH  OIL PO) Take 1 tablet by mouth daily.  Marland Kitchen omeprazole (PRILOSEC) 20 MG capsule Take 1 capsule (20 mg total) by mouth daily.     Past Surgical History:  Procedure Laterality Date  . APPENDECTOMY    . BIOPSY  07/19/2017   Procedure: BIOPSY;  Surgeon: Daneil Dolin, MD;  [facility];  Service: Endoscopy;;  colon  . BRAIN SURGERY    . CATARACT EXTRACTION W/PHACO Left 05/07/2017   Procedure: CATARACT EXTRACTION PHACO AND INTRAOCULAR LENS PLACEMENT (IOC);  Surgeon: Baruch Goldmann, MD;  Location: [facility];  Service: Ophthalmology;  Laterality: Left;  CDE: 10.49  . CATARACT EXTRACTION W/PHACO Right 06/04/2017    Procedure: CATARACT EXTRACTION PHACO AND INTRAOCULAR LENS PLACEMENT RIGHT EYE;  Surgeon: Baruch Goldmann, MD;  Location: [facility];  Service: Ophthalmology;  Laterality: Right;  CDE: 7.07  . COLONOSCOPY  02/2010   patchy erythema, minimal granularity in distal rectum, left-sided diverticula, ileal diverticulum.  Bx benign. next TCS 02/2012  . COLONOSCOPY  03/24/2012   Dr. Gala Romney- inflammatory changes of the rectum and colon consistent with history of U/C. bx= benign colonic mucosa  . COLONOSCOPY N/A 10/05/2014   RMR: abnormal rectal and sigmoid mucosa as described above status post segmental biopsy. active colitis sigmoid colon.  . COLONOSCOPY WITH PROPOFOL N/A 07/19/2017   Procedure: COLONOSCOPY WITH PROPOFOL;  Surgeon: Daneil Dolin, MD;  [facility];  Service: Endoscopy;  Laterality: N/A;  7:30am  . cyst removed from urinary bladder    . ESOPHAGOGASTRODUODENOSCOPY  04/2008   schatzki ring, s/p ED, hh  . LUMBAR FUSION    . SHOULDER ARTHROSCOPY W/ ROTATOR CUFF REPAIR Bilateral   . TONSILLECTOMY      Family History  Problem Relation Age of Onset  . Lung cancer Mother 25       deceased  . Other Brother        Posttraumatic stress disorder  . Cancer Brother        skin cancer  . Cancer Sister        thyroid  . Arthritis Sister   . SIDS Brother   . Huntington's disease Daughter   . Colon cancer Neg Hx     New complaints: None today  Social history: Lives with wife. His daughter in [place] with huntingsChorea  Controlled substance contract: n/a    Review of Systems  Constitutional: Negative for diaphoresis.  Eyes: Negative for pain.  Respiratory: Negative for shortness of breath.   Cardiovascular: Negative for chest pain, palpitations and leg swelling.  Gastrointestinal: Negative for abdominal pain.  Endocrine: Negative for polydipsia.  Skin: Negative for rash.  Neurological: Negative for dizziness, weakness and headaches.  Hematological: Does not  bruise/bleed easily.  All other systems reviewed and are negative.      Objective:   Physical Exam Vitals and nursing note reviewed.  Constitutional:      Appearance: Normal appearance. He is well-developed and well-nourished.  HENT:     Head: Normocephalic.     Nose: Nose normal.     Mouth/Throat:     Mouth: Oropharynx is clear and moist.  Eyes:     Extraocular Movements: EOM normal.     Pupils: Pupils are equal, round, and reactive to light.  Neck:     Thyroid: No thyroid mass or thyromegaly.     Vascular: No carotid bruit or JVD.     Trachea: Phonation normal.  Cardiovascular:     Rate and Rhythm: Normal rate  and regular rhythm.  Pulmonary:     Effort: Pulmonary effort is normal. No respiratory distress.     Breath sounds: Normal breath sounds.  Abdominal:     General: Bowel sounds are normal. Aorta is normal.     Palpations: Abdomen is soft.     Tenderness: There is no abdominal tenderness.  Musculoskeletal:        General: Normal range of motion.     Cervical back: Normal range of motion and neck supple.  Lymphadenopathy:     Cervical: No cervical adenopathy.  Skin:    General: Skin is warm and dry.  Neurological:     Mental Status: He is alert and oriented to person, place, and time.  Psychiatric:        Mood and Affect: Mood and affect normal.        Behavior: Behavior normal.        Thought Content: Thought content normal.        Judgment: Judgment normal.     BP 139/79   Pulse 63   Temp (!) 97.3 F (36.3 C) (Temporal)   Resp 20   Ht 5' 6"  (1.676 m)   Wt 157 lb (71.2 kg)   SpO2 98%   BMI 25.34 kg/m        Assessment & Plan:  LADISLAUS REPSHER comes in today with chief complaint of Medical Management of Chronic Issues   Diagnosis and orders addressed:  1. Essential hypertension Low sodium diet - lisinopril-hydrochlorothiazide (ZESTORETIC) 20-12.5 MG tablet; Take 1 tablet by mouth daily.  Dispense: 90 tablet; Refill: 1  2. Hyperlipidemia  with target LDL less than 100 Low fat diet  3. Permanent atrial fibrillation (HCC) Avoid caffeine  4. Gastroesophageal reflux disease, unspecified whether esophagitis present Avoid spicy foods Do not eat 2 hours prior to bedtime  5. Hypothyroidism due to acquired atrophy of thyroid Labs reviewed - levothyroxine (SYNTHROID) 50 MCG tablet; Take 1 tablet (50 mcg total) by mouth daily before breakfast.  Dispense: 90 tablet; Refill: 1  6. Recurrent major depressive disorder, in full remission ([place]) Stress management - citalopram (CELEXA) 20 MG tablet; Take 1 tablet (20 mg total) by mouth daily.  Dispense: 90 tablet; Refill: 1  7. Memory deficits Work on memory daily  8. Huntington's chorea (HCC) - haloperidol (HALDOL) 1 MG tablet; Take 1.5 tablets (1.5 mg total) by mouth 2 (two) times daily.  Dispense: 270 tablet; Refill: 1  9. Abnormality of gait Fall prevention  10. Diverticulosis of large intestine without hemorrhage  11. Benign prostatic hyperplasia without lower urinary tract symptoms  12. BMI 27.0-27.9,adult Discussed diet and exercise for person with BMI >25 Will recheck weight in 3-6 months  13. Gastroesophageal reflux disease - omeprazole (PRILOSEC) 20 MG capsule; Take 1 capsule (20 mg total) by mouth daily.  Dispense: 90 capsule; Refill: 1   Labs pending Health Maintenance reviewed Diet and exercise encouraged  Follow up plan: 3 months   Mary-Margaret Hassell Done, FNP

## 2020-08-13 DIAGNOSIS — L821 Other seborrheic keratosis: Secondary | ICD-10-CM | POA: Diagnosis not present

## 2020-08-13 DIAGNOSIS — C44529 Squamous cell carcinoma of skin of other part of trunk: Secondary | ICD-10-CM | POA: Diagnosis not present

## 2020-10-30 ENCOUNTER — Other Ambulatory Visit: Payer: Medicare Other

## 2020-10-30 DIAGNOSIS — E785 Hyperlipidemia, unspecified: Secondary | ICD-10-CM | POA: Diagnosis not present

## 2020-10-30 DIAGNOSIS — I10 Essential (primary) hypertension: Secondary | ICD-10-CM

## 2020-10-30 DIAGNOSIS — E034 Atrophy of thyroid (acquired): Secondary | ICD-10-CM

## 2020-10-31 LAB — CMP14+EGFR
ALT: 13 IU/L (ref 0–44)
AST: 27 IU/L (ref 0–40)
Albumin/Globulin Ratio: 1.7 (ref 1.2–2.2)
Albumin: 4.5 g/dL (ref 3.6–4.6)
Alkaline Phosphatase: 62 IU/L (ref 44–121)
BUN/Creatinine Ratio: 17 (ref 10–24)
BUN: 16 mg/dL (ref 8–27)
Bilirubin Total: 0.8 mg/dL (ref 0.0–1.2)
CO2: 27 mmol/L (ref 20–29)
Calcium: 9.9 mg/dL (ref 8.6–10.2)
Chloride: 96 mmol/L (ref 96–106)
Creatinine, Ser: 0.96 mg/dL (ref 0.76–1.27)
Globulin, Total: 2.6 g/dL (ref 1.5–4.5)
Glucose: 109 mg/dL — ABNORMAL HIGH (ref 65–99)
Potassium: 4.2 mmol/L (ref 3.5–5.2)
Sodium: 139 mmol/L (ref 134–144)
Total Protein: 7.1 g/dL (ref 6.0–8.5)
eGFR: 78 mL/min/{1.73_m2} (ref >59)

## 2020-10-31 LAB — LIPID PANEL
Chol/HDL Ratio: 5.5 ratio — ABNORMAL HIGH (ref 0.0–5.0)
Cholesterol, Total: 204 mg/dL — ABNORMAL HIGH (ref 100–199)
HDL: 37 mg/dL — ABNORMAL LOW (ref >39)
LDL Chol Calc (NIH): 144 mg/dL — ABNORMAL HIGH (ref 0–99)
Triglycerides: 127 mg/dL (ref 0–149)
VLDL Cholesterol Cal: 23 mg/dL (ref 5–40)

## 2020-10-31 LAB — THYROID PANEL WITH TSH
Free Thyroxine Index: 2.1 (ref 1.2–4.9)
T3 Uptake Ratio: 29 % (ref 24–39)
T4, Total: 7.1 ug/dL (ref 4.5–12.0)
TSH: 2.67 u[IU]/mL (ref 0.450–4.500)

## 2020-10-31 LAB — CBC WITH DIFFERENTIAL/PLATELET
Basophils Absolute: 0.1 10*3/uL (ref 0.0–0.2)
Basos: 1 %
EOS (ABSOLUTE): 0.2 10*3/uL (ref 0.0–0.4)
Eos: 3 %
Hematocrit: 47.4 % (ref 37.5–51.0)
Hemoglobin: 16.1 g/dL (ref 13.0–17.7)
Immature Grans (Abs): 0 10*3/uL (ref 0.0–0.1)
Immature Granulocytes: 0 %
Lymphocytes Absolute: 2 10*3/uL (ref 0.7–3.1)
Lymphs: 29 %
MCH: 33.3 pg — ABNORMAL HIGH (ref 26.6–33.0)
MCHC: 34 g/dL (ref 31.5–35.7)
MCV: 98 fL — ABNORMAL HIGH (ref 79–97)
Monocytes Absolute: 0.8 10*3/uL (ref 0.1–0.9)
Monocytes: 11 %
Neutrophils Absolute: 3.9 10*3/uL (ref 1.4–7.0)
Neutrophils: 56 %
Platelets: 230 10*3/uL (ref 150–450)
RBC: 4.83 x10E6/uL (ref 4.14–5.80)
RDW: 12.6 % (ref 11.6–15.4)
WBC: 7 10*3/uL (ref 3.4–10.8)

## 2020-11-05 ENCOUNTER — Encounter: Payer: Self-pay | Admitting: Nurse Practitioner

## 2020-11-05 ENCOUNTER — Ambulatory Visit (INDEPENDENT_AMBULATORY_CARE_PROVIDER_SITE_OTHER): Payer: Medicare Other | Admitting: Nurse Practitioner

## 2020-11-05 ENCOUNTER — Other Ambulatory Visit: Payer: Self-pay

## 2020-11-05 VITALS — BP 115/67 | HR 62 | Temp 97.6°F | Resp 20 | Ht 66.0 in | Wt 157.0 lb

## 2020-11-05 DIAGNOSIS — R413 Other amnesia: Secondary | ICD-10-CM

## 2020-11-05 DIAGNOSIS — R269 Unspecified abnormalities of gait and mobility: Secondary | ICD-10-CM | POA: Diagnosis not present

## 2020-11-05 DIAGNOSIS — I10 Essential (primary) hypertension: Secondary | ICD-10-CM | POA: Diagnosis not present

## 2020-11-05 DIAGNOSIS — K219 Gastro-esophageal reflux disease without esophagitis: Secondary | ICD-10-CM

## 2020-11-05 DIAGNOSIS — I4821 Permanent atrial fibrillation: Secondary | ICD-10-CM | POA: Diagnosis not present

## 2020-11-05 DIAGNOSIS — F3342 Major depressive disorder, recurrent, in full remission: Secondary | ICD-10-CM

## 2020-11-05 DIAGNOSIS — E785 Hyperlipidemia, unspecified: Secondary | ICD-10-CM

## 2020-11-05 DIAGNOSIS — E034 Atrophy of thyroid (acquired): Secondary | ICD-10-CM | POA: Diagnosis not present

## 2020-11-05 DIAGNOSIS — Z6827 Body mass index (BMI) 27.0-27.9, adult: Secondary | ICD-10-CM

## 2020-11-05 DIAGNOSIS — G10 Huntington's disease: Secondary | ICD-10-CM

## 2020-11-05 DIAGNOSIS — K5730 Diverticulosis of large intestine without perforation or abscess without bleeding: Secondary | ICD-10-CM

## 2020-11-05 DIAGNOSIS — N400 Enlarged prostate without lower urinary tract symptoms: Secondary | ICD-10-CM

## 2020-11-05 NOTE — Progress Notes (Signed)
Subjective:    Patient ID: Sergio Baker, male    DOB: September 19, 1934, 86 y.o.   MRN: [id number]   Chief Complaint: Follow up for chronic disease management.   HPI:  1. Primary hypertension Taking linisopril/hctz. Tolerating well. Denies CP, SOB, HA. Periodically monitors blood pressure at home.   BP Readings from Last 2 Encounters:  11/05/20 115/67  08/06/20 139/79    2. Permanent atrial fibrillation (HCC) Denies Heart Palps. Takes ASA. Saw Dr. Domenic Polite last spring and they decided to use ASA for anticoag mgt vs anticoagulant. Denies any periods of lightheadedness, syncope, or falls.   3. Hypothyroidism due to acquired atrophy of thyroid No issues that he is aware of. On synthroid, tolerating well.   Lab Results  Component Value Date   TSH 2.670 10/30/2020    4. Gastroesophageal reflux disease, unspecified whether esophagitis present Taking omperazole. Doing well on this. No complaints.   5. Huntington's chorea ([place]) See neurology q25mo On Haldol for muscle spasm/twitches. Symptoms are controled well per patient. His wife believes his symptoms are worsening.   6. Benign prostatic hyperplasia without lower urinary tract symptoms Denies issues voiding. Is not following with urology.   Lab Results  Component Value Date   PSA1 5.3 (H) 05/08/2019   PSA1 5.5 (H) 04/28/2018   PSA1 5.3 (H) 03/12/2017   PSA 4.8 (H) 01/18/2014     7. Hyperlipidemia with target LDL less than 100 Tries to watch diet, but cannot exercise.   Lab Results  Component Value Date   CHOL 204 (H) 10/30/2020   HDL 37 (L) 10/30/2020   LDLCALC 144 (H) 10/30/2020   TRIG 127 10/30/2020   CHOLHDL 5.5 (H) 10/30/2020    8. BMI 27.0-27.9,adult Weight stable.  Wt Readings from Last 3 Encounters:  11/05/20 157 lb (71.2 kg)  08/06/20 157 lb (71.2 kg)  06/05/20 158 lb (71.7 kg)    9. Diverticulosis of large intestine without hemorrhage No recent flare ups.   10. Recurrent major depressive  disorder, in full remission ([facility] [place]  Taking Celexa. Doing well with this.   FM[facility]idianOffice [facility]/2020 [facility] PH[facility]-9 Total Score 0      11. Abnormality of gait No recent falls. Gait remains unsteady. He and his wife states it is getting worse. He as started using his cane.   12. Memory Deficits Has had no increase in symptoms, however speech is getting more slurred.   Outpatient Encounter Medications as of 11/05/2020  Medication Sig  . acetaminophen (TYLENOL) 650 MG CR tablet Take 1,300 mg by mouth daily as needed for pain.  . Aloe Vera 25 MG CAPS Take 25 mg by mouth daily.  .Marland Kitchenaspirin EC 81 MG tablet Take 81 mg by mouth daily.  .Marland KitchenCALCIUM CITRATE PO Take 1 tablet by mouth daily.  . Cholecalciferol (VITAMIN D3) 2000 UNITS capsule Take 2,000 Units by mouth daily.  . citalopram (CELEXA) 20 MG tablet Take 1 tablet (20 mg total) by mouth daily.  . Glucosamine-Chondroit-Vit C-Mn (GLUCOSAMINE CHONDR 1500 COMPLX PO) Take 1 tablet by mouth daily.  . haloperidol (HALDOL) 1 MG tablet Take 1.5 tablets (1.5 mg total) by mouth 2 (two) times daily.  .Marland Kitchenlevothyroxine (SYNTHROID) 50 MCG tablet Take 1 tablet (50 mcg total) by mouth daily before breakfast.  . lisinopril-hydrochlorothiazide (ZESTORETIC) 20-12.5 MG tablet Take 1 tablet by mouth daily.  . Methylsulfonylmethane (MSM) 1000 MG CAPS Take 1,000 mg by mouth daily.  . Multiple Vitamin (  MULTIVITAMIN WITH MINERALS) TABS Take 1 tablet by mouth daily. For Men 50+  . Omega-3 Fatty Acids (FISH OIL PO) Take 1 tablet by mouth daily.  Marland Kitchen omeprazole (PRILOSEC) 20 MG capsule Take 1 capsule (20 mg total) by mouth daily.   No facility-administered encounter medications on file as of 11/05/2020.    Past Surgical History:  Procedure Laterality Date  . APPENDECTOMY    . BIOPSY  07/19/2017   Procedure: BIOPSY;  Surgeon: Daneil Dolin, MD;  Location: [facility];  Service: Endoscopy;;  colon  . BRAIN SURGERY    .  CATARACT EXTRACTION W/PHACO Left 05/07/2017   Procedure: CATARACT EXTRACTION PHACO AND INTRAOCULAR LENS PLACEMENT (IOC);  Surgeon: Baruch Goldmann, MD;  Location: [facility];  Service: Ophthalmology;  Laterality: Left;  CDE: 10.49  . CATARACT EXTRACTION W/PHACO Right 06/04/2017   Procedure: CATARACT EXTRACTION PHACO AND INTRAOCULAR LENS PLACEMENT RIGHT EYE;  Surgeon: Baruch Goldmann, MD;  Location: [facility];  Service: Ophthalmology;  Laterality: Right;  CDE: 7.07  . COLONOSCOPY  02/2010   patchy erythema, minimal granularity in distal rectum, left-sided diverticula, ileal diverticulum.  Bx benign. next TCS 02/2012  . COLONOSCOPY  03/24/2012   Dr. Gala Romney- inflammatory changes of the rectum and colon consistent with history of U/C. bx= benign colonic mucosa  . COLONOSCOPY N/A 10/05/2014   RMR: abnormal rectal and sigmoid mucosa as described above status post segmental biopsy. active colitis sigmoid colon.  . COLONOSCOPY WITH PROPOFOL N/A 07/19/2017   Procedure: COLONOSCOPY WITH PROPOFOL;  Surgeon: Daneil Dolin, MD;  Location: [facility];  Service: Endoscopy;  Laterality: N/A;  7:30am  . cyst removed from urinary bladder    . ESOPHAGOGASTRODUODENOSCOPY  04/2008   schatzki ring, s/p ED, [facility]  . LUMBAR FUSION    . SHOULDER ARTHROSCOPY W/ ROTATOR CUFF REPAIR Bilateral   . TONSILLECTOMY      Family History  Problem Relation Age of Onset  . Lung cancer Mother 1       deceased  . Other Brother        Posttraumatic stress disorder  . Cancer Brother        skin cancer  . Cancer Sister        thyroid  . Arthritis Sister   . SIDS Brother   . Huntington's disease Daughter   . Colon cancer Neg Hx     New complaints: none  Social history: Lives with wife  Controlled substance contract: n/a     Review of Systems  Respiratory: Negative for cough, shortness of breath and wheezing.   Cardiovascular: Negative for chest pain and palpitations.  Gastrointestinal: Negative for abdominal distention,  abdominal pain, diarrhea and nausea.  Genitourinary: Negative for difficulty urinating, dysuria and hematuria.  Musculoskeletal: Positive for gait problem.  Neurological: Positive for dizziness. Negative for syncope, light-headedness and headaches.  Psychiatric/Behavioral: Negative for decreased concentration and dysphoric mood. The patient is not nervous/anxious.        Objective:   Physical Exam Constitutional:      Appearance: Normal appearance.  Cardiovascular:     Rate and Rhythm: Normal rate and regular rhythm.     Pulses: Normal pulses.     Heart sounds: Normal heart sounds.  Pulmonary:     Effort: Pulmonary effort is normal.     Breath sounds: Normal breath sounds.  Abdominal:     General: Abdomen is flat. Bowel sounds are normal.     Palpations: Abdomen is soft.  Musculoskeletal:  Cervical back: Normal range of motion and neck supple.  Skin:    General: Skin is warm and dry.     Capillary Refill: Capillary refill takes less than 2 seconds.  Neurological:     Mental Status: He is alert. Mental status is at baseline.  Psychiatric:        Mood and Affect: Mood normal.        Thought Content: Thought content normal.        Judgment: Judgment normal.    Vitals:   11/05/20 1208  BP: 115/67  Pulse: 62  Resp: 20  Temp: 97.6 F (36.4 C)  SpO2: 91%       Assessment & Plan:   RUBEN MAHLER comes in today with chief complaint of Medical Management of Chronic Issues   Diagnosis and orders addressed:  1. Primary hypertension Continue to manage with medication as prescribed.   2. Permanent atrial fibrillation ([place]) Continue to follow up with cardiology. Continue daily ASA  3. Hypothyroidism due to acquired atrophy of thyroid Continue synthroid  4. Gastroesophageal reflux disease, unspecified whether esophagitis present Continue omperazole, avoiding spicy foods and eating late night  5. Huntington's chorea ([place]) Continue to follow up with neurology and  taking haldol for symptom management.   6. Benign prostatic hyperplasia without lower urinary tract symptoms Alert office if symptoms return.   7. Hyperlipidemia with target LDL less than 100 Continue to control with diet.   8. BMI 27.0-27.9,adult Weight stable.   9. Diverticulosis of large intestine without hemorrhage Continue to manage with diet.   10. Recurrent major depressive disorder, in full remission ([place]) Continue celexa. Alert office for any changes is symptoms.   11. Abnormality of gait Be aware of cords, wires, change in flooring at home. Where house shoes vs socks when at home. Continue to use cane for support.   12. Memory deficits Stable at this time per his wife   Labs pending Health Maintenance reviewed Diet and exercise encouraged  Follow up plan: 3 months  Sergio Primrose, RN, BSN, FNP-Student  Sergio Hassell Done, FNP

## 2020-11-15 DIAGNOSIS — H905 Unspecified sensorineural hearing loss: Secondary | ICD-10-CM | POA: Diagnosis not present

## 2020-12-10 IMAGING — DX DG SHOULDER 2+V*L*
3 series · 3 of 3 positions shown · non-contrast
Comparison: 09/14/2019.

CLINICAL DATA: Left shoulder pain.

EXAM:
LEFT SHOULDER - 2+ VIEW

[shoulder ap (1 of 3)]
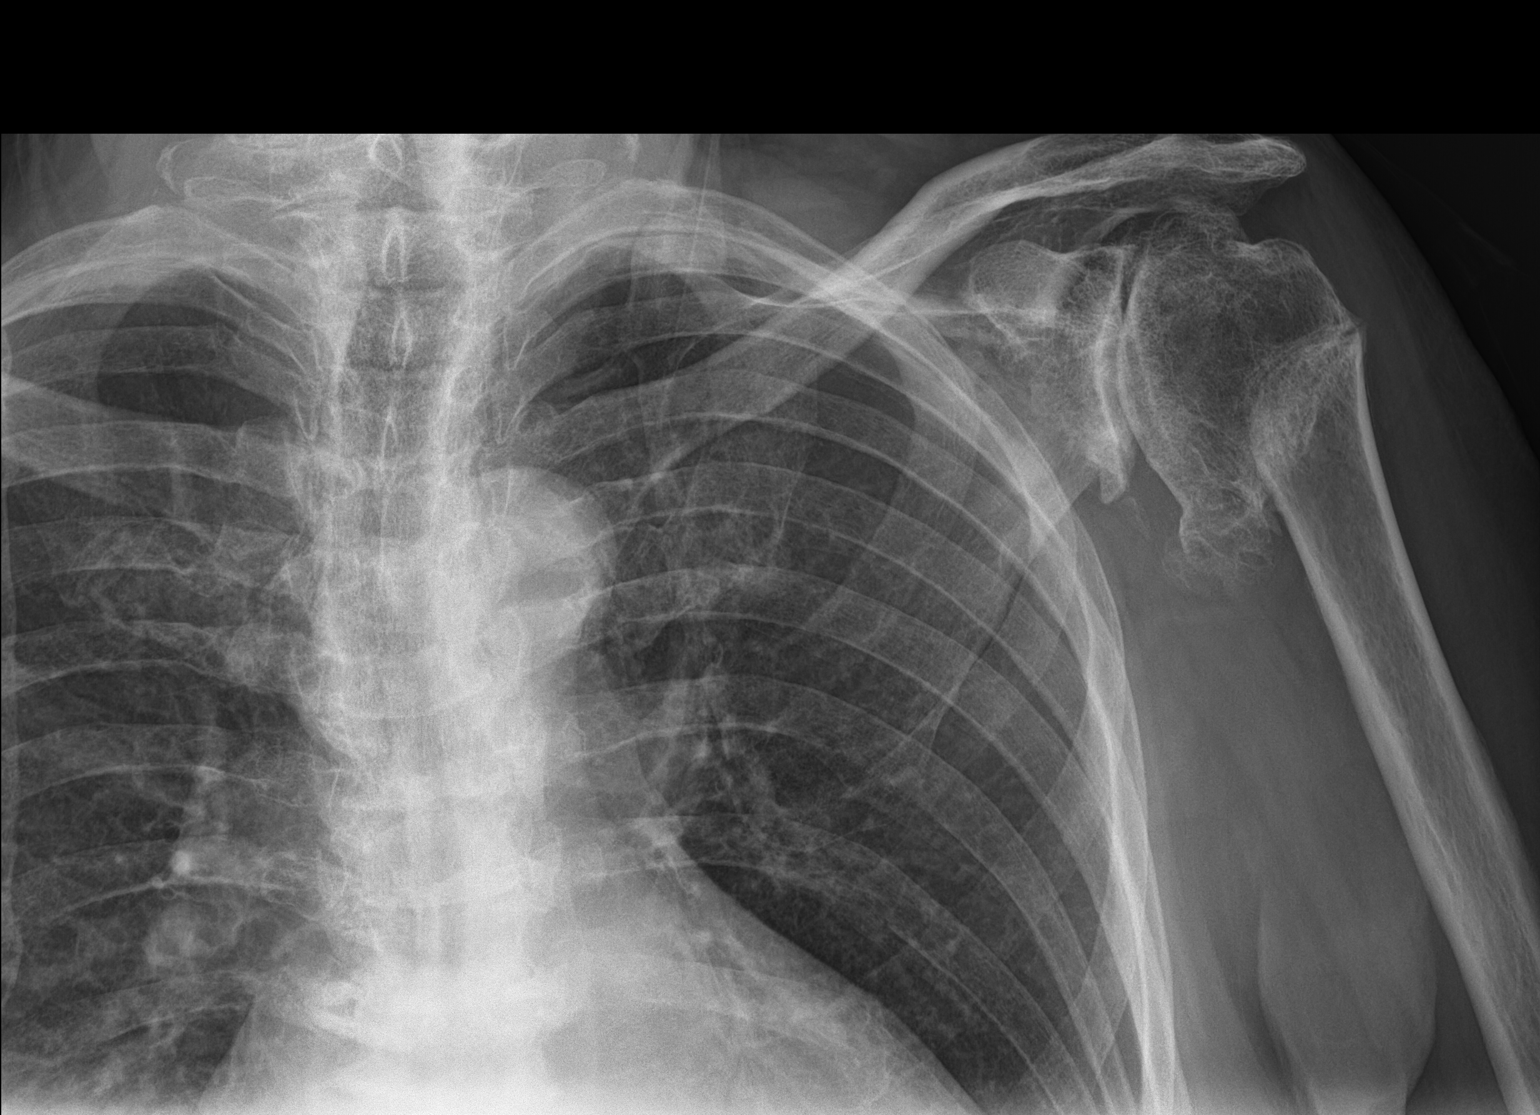

[shoulder ap (2 of 3)]
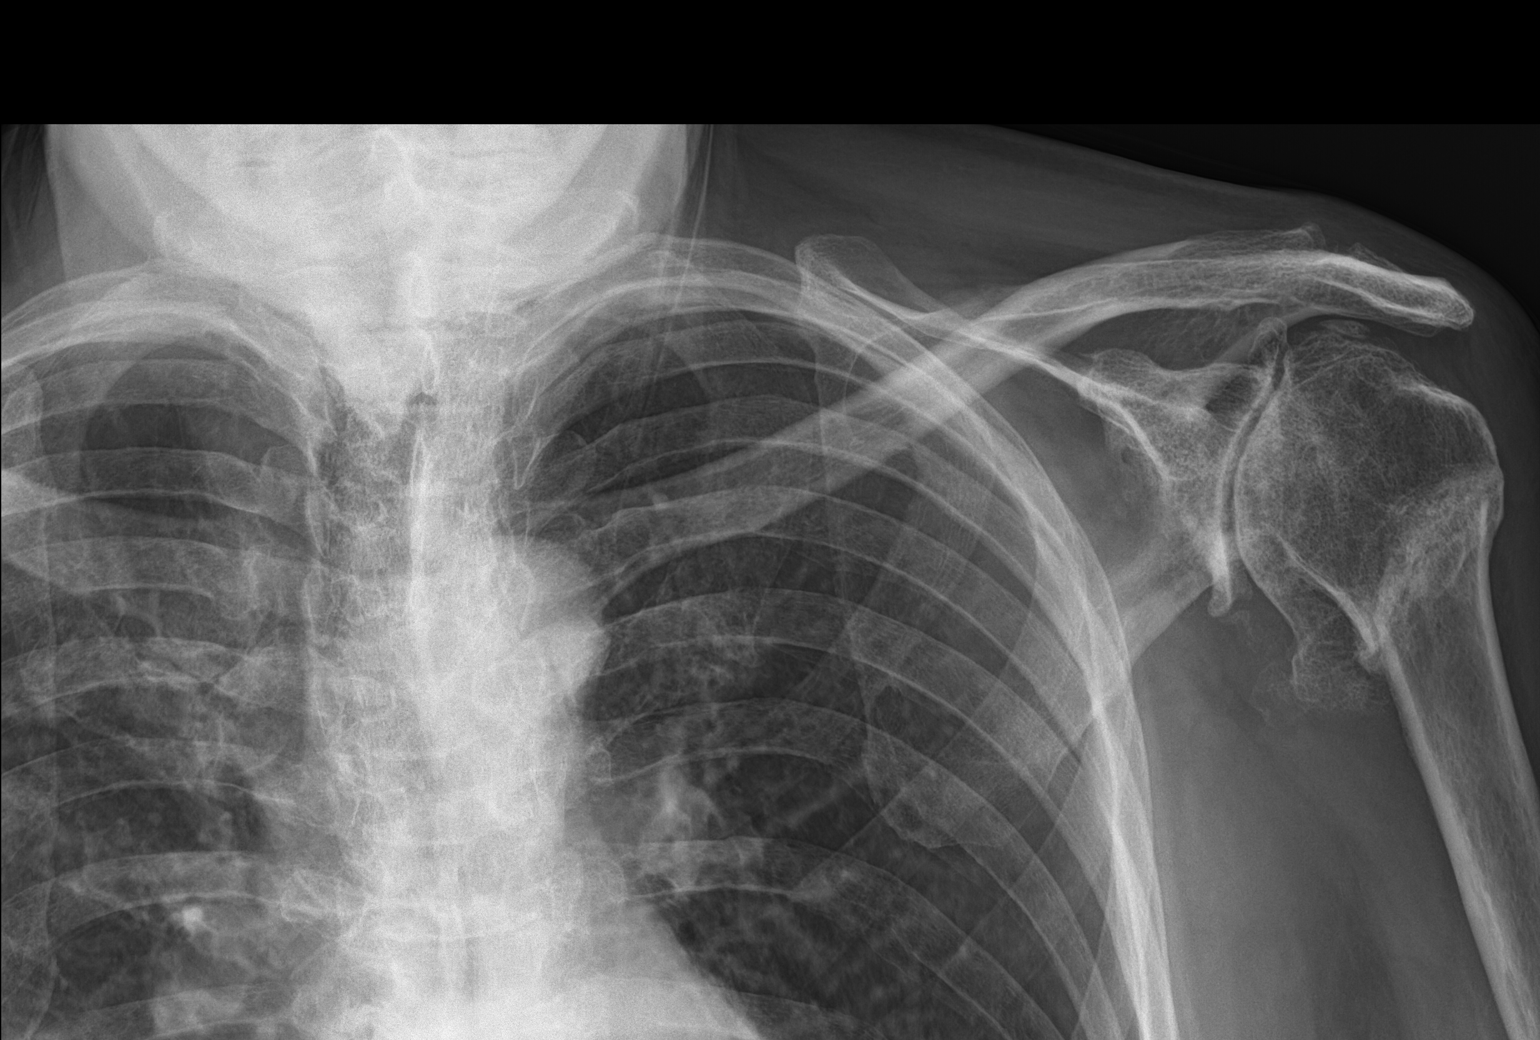

[shoulder ap (3 of 3)]
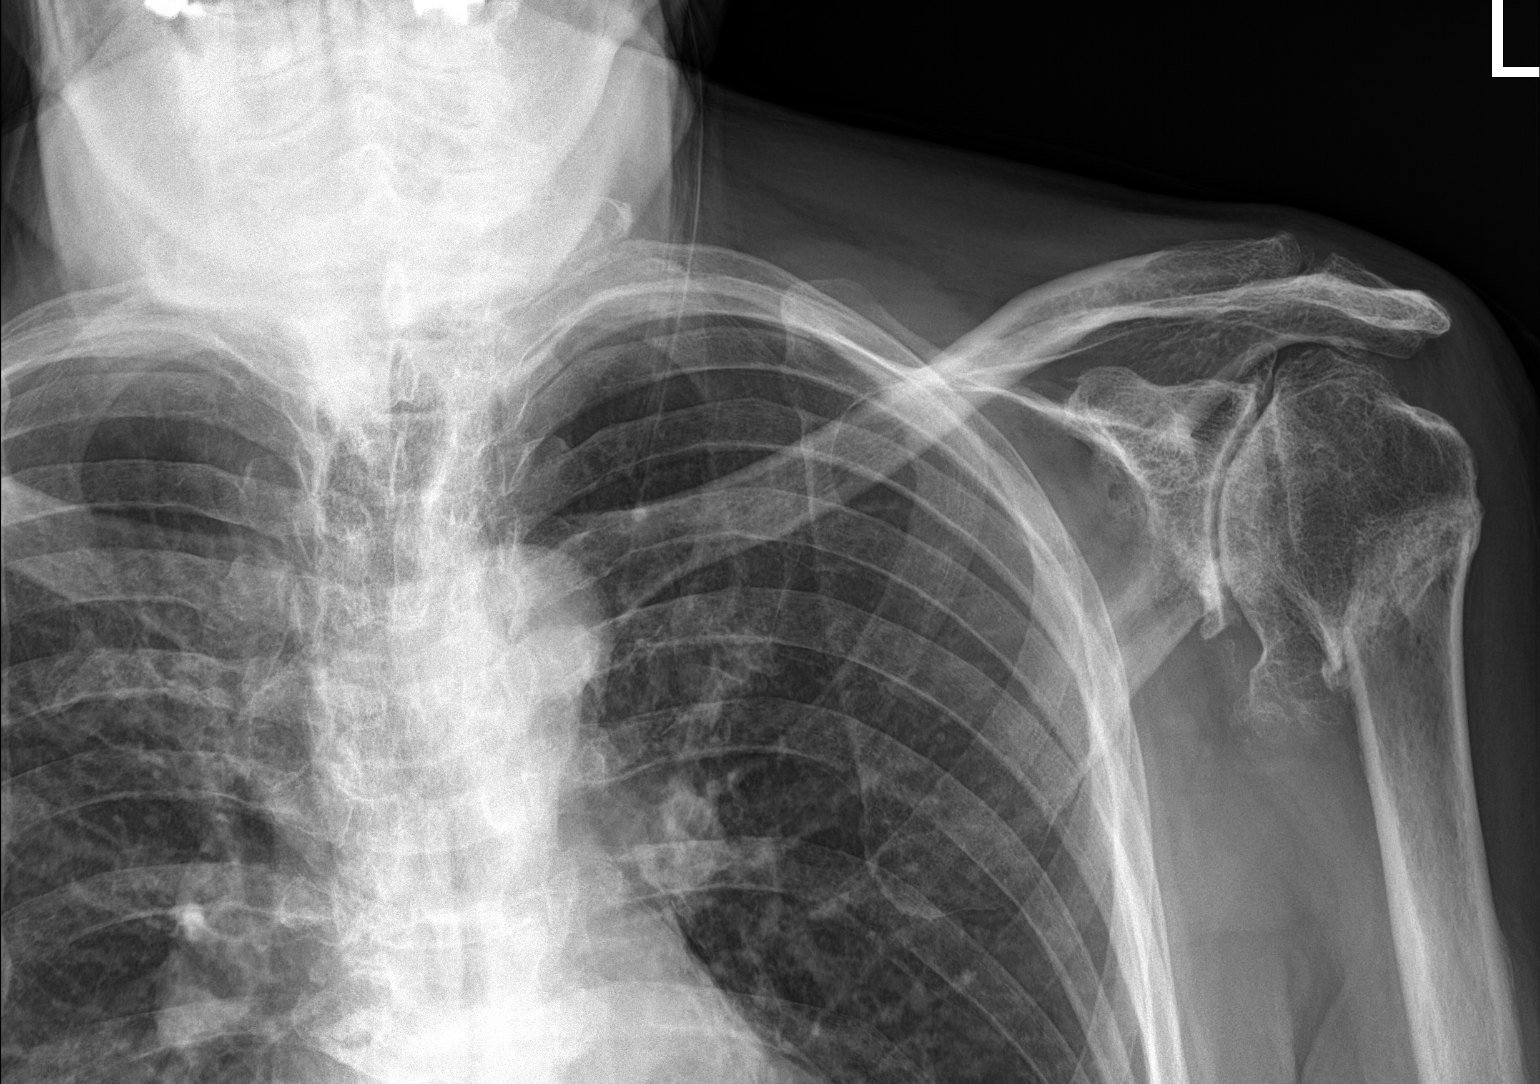

[3 of 3 positions shown; findings below may reference images not displayed]

FINDINGS: Similar alignment of a nondisplaced, slightly impacted fracture of
the humeral neck. Interval development of some bony callus, although
the fracture lucency is still visible. No evidence of new/acute
fracture. Similar advanced glenohumeral degenerative change with
severe joint space loss, bulky marginal osteophytes and
intra-articular loose bodies. Osteopenia.
IMPRESSION: 1. Similar alignment of a nondisplaced fracture of the humeral neck.
Interval development of some bony callus, although the fracture
lucency is still visible.
2. Severe glenohumeral degenerative change.

## 2020-12-12 ENCOUNTER — Ambulatory Visit (INDEPENDENT_AMBULATORY_CARE_PROVIDER_SITE_OTHER): Payer: Medicare Other

## 2020-12-12 VITALS — Ht 66.0 in | Wt 160.0 lb

## 2020-12-12 DIAGNOSIS — Z0000 Encounter for general adult medical examination without abnormal findings: Secondary | ICD-10-CM | POA: Diagnosis not present

## 2020-12-12 NOTE — Progress Notes (Signed)
Subjective:   Sergio Baker is a 86 y.o. male who presents for Medicare Annual/Subsequent preventive examination.  Virtual Visit via Telephone Note  I connected with  Sergio Baker on 12/12/20 at  1:15 PM EDT by telephone and verified that I am speaking with the correct person using two identifiers.  Location: Patient: Home Provider: [facility] Persons participating in the virtual visit: patient, his wife [place]   I discussed the limitations, risks, security and privacy concerns of performing an evaluation and management service by telephone and the availability of in person appointments. The patient expressed understanding and agreed to proceed.  Interactive audio and video telecommunications were attempted between this nurse and patient, however failed, due to patient having technical difficulties OR patient did not have access to video capability.  We continued and completed visit with audio only.  Some vital signs may be absent or patient reported.   Lanah Steines E Catie Chiao, LPN   Review of Systems     Cardiac Risk Factors include: advanced age (>52mn, >>2women);male gender;sedentary lifestyle;dyslipidemia;hypertension     Objective:    Today's Vitals   12/12/20 1327  Weight: 160 lb (72.6 kg)  Height: 5' 6"  (1.676 m)   Body mass index is 25.82 kg/m.  Advanced Directives 12/12/2020 12/04/2019 06/14/2018 07/19/2017 07/14/2017 06/04/2017 05/07/2017  Does Patient Have a Medical Advance Directive? Yes Yes Yes No No No Yes  Type of AParamedicof [place]Living will Living will;Healthcare Power of [place] will - - - [place] will  Does patient want to make changes to medical advance directive? - No - Patient declined No - Patient declined - - - -  Copy of H[place]portin Chart? No - copy requested No - copy requested No - copy requested - - - No - copy requested   Would patient like information on creating a medical advance directive? - - - No - Patient declined No - Patient declined No - Patient declined -    Current Medications (verified) Outpatient Encounter Medications as of 12/12/2020  Medication Sig  . acetaminophen (TYLENOL) 650 MG CR tablet Take 1,300 mg by mouth daily as needed for pain.  . Aloe Vera 25 MG CAPS Take 25 mg by mouth daily.  .Marland Kitchenaspirin EC 81 MG tablet Take 81 mg by mouth daily.  .Marland KitchenCALCIUM CITRATE PO Take 1 tablet by mouth daily.  . Cholecalciferol (VITAMIN D3) 2000 UNITS capsule Take 2,000 Units by mouth daily.  . citalopram (CELEXA) 20 MG tablet Take 1 tablet (20 mg total) by mouth daily.  . Glucosamine-Chondroit-Vit C-Mn (GLUCOSAMINE CHONDR 1500 COMPLX PO) Take 1 tablet by mouth daily.  . haloperidol (HALDOL) 1 MG tablet Take 1.5 tablets (1.5 mg total) by mouth 2 (two) times daily.  .Marland Kitchenlevothyroxine (SYNTHROID) 50 MCG tablet Take 1 tablet (50 mcg total) by mouth daily before breakfast.  . lisinopril-hydrochlorothiazide (ZESTORETIC) 20-12.5 MG tablet Take 1 tablet by mouth daily.  . Methylsulfonylmethane (MSM) 1000 MG CAPS Take 1,000 mg by mouth daily.  . Multiple Vitamin (MULTIVITAMIN WITH MINERALS) TABS Take 1 tablet by mouth daily. For Men 50+  . mupirocin ointment (BACTROBAN) 2 % SMARTSIG:1 Application Topical 2-3 Times Daily  . Omega-3 Fatty Acids (FISH OIL PO) Take 1 tablet by mouth daily.  .Marland Kitchenomeprazole (PRILOSEC) 20 MG capsule Take 1 capsule (20 mg total) by mouth daily.   No facility-administered encounter medications on file as of 12/12/2020.  Allergies (verified) Patient has no known allergies.   History: Past Medical History:  Diagnosis Date  . Abnormality of gait   . Arthritis   . Atrial fibrillation ([place])   . Depression   . Diverticula of colon   . Enlarged prostate   . GERD (gastroesophageal reflux disease)   . Hearing loss   . Hemorrhoids   . Hiatal hernia   . Huntington's disease ([place]) 2010  .  Hyperlipidemia   . Hypothyroidism   . Memory deficits 01/17/2014  . 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 Schatzki's ring   . Ulcerative colitis [facility])    Past Surgical History:  Procedure Laterality Date  . APPENDECTOMY    . BIOPSY  07/19/2017   Procedure: BIOPSY;  Surgeon: Daneil Dolin, MD;  Location[facility];  Service: Endoscopy;;  colon  . BRAIN SURGERY    . CATARACT EXTRACTION W/PHACO Left 05/07/2017   Procedure: CATARACT EXTRACTION PHACO AND INTRAOCULAR LENS PLACEMENT (IOC);  Surgeon: Baruch Goldmann, MD;  Location: [facility] ORS;  Service: Ophthalmology;  Laterality: Left;  CDE: 10.49  . CATARACT EXTRACTION W/PHACO Right 06/04/2017   Procedure: CATARACT EXTRACTION PHACO AND INTRAOCULAR LENS PLACEMENT RIGHT EYE;  Surgeon: Baruch Goldmann, MD;  Location: [facility] ORS;  Service: Ophthalmology;  Laterality: Right;  CDE: 7.07  . COLONOSCOPY  02/2010   patchy erythema, minimal granularity in distal rectum, left-sided diverticula, ileal diverticulum.  Bx benign. next TCS 02/2012  . COLONOSCOPY  03/24/2012   Dr. Gala Romney- inflammatory changes of the rectum and colon consistent with history of U/C. bx= benign colonic mucosa  . COLONOSCOPY N/A 10/05/2014   RMR: abnormal rectal and sigmoid mucosa as described above status post segmental biopsy. active colitis sigmoid colon.  . COLONOSCOPY WITH PROPOFOL N/A 07/19/2017   Procedure: COLONOSCOPY WITH PROPOFOL;  Surgeon: Daneil Dolin, MD;  [facility];  Service: Endoscopy;  Laterality: N/A;  7:30am  . cyst removed from urinary bladder    . ESOPHAGOGASTRODUODENOSCOPY  04/2008   schatzki ring, s/p ED, hh  . LUMBAR FUSION    . SHOULDER ARTHROSCOPY W/ ROTATOR CUFF REPAIR Bilateral   . TONSILLECTOMY     Family History  Problem Relation Age of Onset  . Lung cancer Mother 75       deceased  . Other Brother        Posttraumatic stress disorder  . Cancer Brother        skin cancer  . Cancer Sister        thyroid  . Arthritis  Sister   . SIDS Brother   . Huntington's disease Daughter   . Colon cancer Neg Hx    Social History   Socioeconomic History  . Marital status: Married    Spouse name: [name]  . Number of children: 1  . Years of education: 12th  . Highest education level: 12th grade  Occupational History  . Occupation: retired Furniture conservator/restorer  . Occupation: works as Administrator, arts at [employer]  Tobacco Use  . Smoking status: Former Smoker    Packs/day: 0.25    Years: 8.00    Pack years: 2.00    Types: Cigarettes    Quit date: 05/03/1973    Years since quitting: 47.6  . Smokeless tobacco: Never Used  . Tobacco comment: Quit 1970's   Vaping Use  . Vaping Use: Never used  Substance and Sexual Activity  . Alcohol use: No    Alcohol/week: 0.0 standard drinks  .  Drug use: No  . Sexual activity: Not on file  Other Topics Concern  . Not on file  Social History Narrative   Patient lives at home with his wife.    Social Determinants of Health   Financial Resource Strain: Not on file  Food Insecurity: Not on file  Transportation Needs: Not on file  Physical Activity: Inactive  . Days of Exercise per Week: 0 days  . Minutes of Exercise per Session: 0 min  Stress: No Stress Concern Present  . Feeling of Stress : Not at all  Social Connections: Socially Integrated  . Frequency of Communication with Friends and Family: More than three times a week  . Frequency of Social Gatherings with Friends and Family: More than three times a week  . Attends Religious Services: More than 4 times per year  . Active Member of Clubs or Organizations: Yes  . Attends Archivist Meetings: More than 4 times per year  . Marital Status: Married    Tobacco Counseling Counseling given: Not Answered Comment: Quit 1970's    Clinical Intake:  Pre-visit preparation completed: Yes  Pain : No/denies pain     BMI - recorded: 25.35 Nutritional Status: BMI 25 -29 Overweight Nutritional Risks: None Diabetes:  No  How often do you need to have someone help you when you read instructions, pamphlets, or other written materials from your doctor or pharmacy?: 1 - Never  Diabetic? No  Interpreter Needed?: No  Information entered by :: Saamir Armstrong, LPN   Activities of Daily Living In your present state of health, do you have any difficulty performing the following activities: 12/12/2020  Hearing? Y  Vision? N  Difficulty concentrating or making decisions? N  Walking or climbing stairs? N  Dressing or bathing? N  Doing errands, shopping? N  Preparing Food and eating ? N  Using the Toilet? N  In the past six months, have you accidently leaked urine? N  Do you have problems with loss of bowel control? N  Managing your Medications? N  Managing your Finances? N  Housekeeping or managing your Housekeeping? N  Some recent data might be hidden    Patient Care Team: Chevis Pretty, FNP as PCP - General (Nurse Practitioner) Satira Sark, MD as PCP - Cardiology (Cardiology) Gala Romney Cristopher Estimable, MD (Gastroenterology)  Indicate any recent Medical Services you may have received from other than [facility] providers in the past year (date may be approximate).     Assessment:   This is a routine wellness examination for Pranay.  Hearing/Vision screen  Hearing Screening   125Hz  250Hz  500Hz  1000Hz  2000Hz  3000Hz  4000Hz  6000Hz  8000Hz   Right ear:           Left ear:           Comments: Moderate hearing difficulty - has hearing aids from [facility] in [place] Comments: Wears eyeglasses - Annual visits with [facility] in [place] - up to date with eye exam  Dietary issues and exercise activities discussed: Current Exercise Habits: The patient does not participate in regular exercise at present, Exercise limited by: Other - see comments (Huntington's Chorea)  Goals    . Chronic Disease Management Needs     CARE PLAN ENTRY (see longtitudinal plan of care for additional care  plan information)  Current Barriers:  . Chronic Disease Management support, education, and care coordination needs related to HTN, Afib, ulcerative colitis, hypothyroidism, Huntington's Chorea  Clinical Goal(s) related to HTN, Afib, ulcerative  colitis, hypothyroidism, Huntington's Chorea:  Over the next 45 days, patient will:  . Work with the care management team to address educational, disease management, and care coordination needs  . Begin or continue self health monitoring activities as directed today Measure and record blood pressure 4 times per week . Call provider office for new or worsened signs and symptoms Blood pressure findings outside established parameters . Call care management team with questions or concerns  Interventions related to HTN, Afib, ulcerative colitis, hypothyroidism, Huntington's Chorea:  . Evaluation of current treatment plans and patient's adherence to plan as established by provider . Talked with patient's wife by telephone . Assessed disease state and needs . Discussed completion of ADLs . Provided disease specific education to patient  . Collaborated with appropriate clinical care team members regarding patient needs . Chart reviewed including recent office notes and lab results  Patient Self Care Activities related to HTN, Afib, ulcerative colitis, hypothyroidism, Huntington's Chorea:  . Patient is unable to independently self-manage chronic health conditions  Please see past updates related to this goal by clicking on the "Past Updates" button in the selected goal      . Exercise 150 minutes per week (moderate activity)     12/04/2019 AWV Goal: Exercise for General Health   Patient will verbalize understanding of the benefits of increased physical activity:  Exercising regularly is important. It will improve your overall fitness, flexibility, and endurance.  Regular exercise also will improve your overall health. It can help you control your  weight, reduce stress, and improve your bone density.  Over the next year, patient will increase physical activity as tolerated with a goal of at least 150 minutes of moderate physical activity per week.   You can tell that you are exercising at a moderate intensity if your heart starts beating faster and you start breathing faster but can still hold a conversation.  Moderate-intensity exercise ideas include:  Walking 1 mile (1.6 km) in about 15 minutes  Biking  Hiking  Golfing  Dancing  Water aerobics  Patient will verbalize understanding of everyday activities that increase physical activity by providing examples like the following: ? Yard work, such as: ? Pushing a Conservation officer, nature ? Raking and bagging leaves ? Washing your car ? Pushing a stroller ? Shoveling snow ? Gardening ? Washing windows or floors  Patient will be able to explain general safety guidelines for exercising:   Before you start a new exercise program, talk with your health care provider.  Do not exercise so much that you hurt yourself, feel dizzy, or get very short of breath.  Wear comfortable clothes and wear shoes with good support.  Drink plenty of water while you exercise to prevent dehydration or heat stroke.  Work out until your breathing and your heartbeat get faster.     . Have 3 meals a day     12/04/2019 AWV Goal: Improved Nutrition/Diet  . Patient will verbalize understanding that diet plays an important role in overall health and that a poor diet is a risk factor for many chronic medical conditions.  . Over the next year, patient will improve self management of their diet by incorporating better variety, less frequent dining out, decreased fat intake, fewer sweetened foods & beverages, increased physical activity, improved protein intake, better food choices, adequate fluid intake (at least 6 cups of fluid per day), and watch portion sizes/amount of food eaten at one time. . Patient will  utilize available community resources to  help with food acquisition if needed (ex: food pantries, [address], etc) . Patient will work with nutrition specialist if a referral was made       Depression Screen PHQ 2/9 Scores 12/12/2020 11/05/2020 08/06/2020 06/05/2020 03/15/2020 12/04/2019 12/01/2019  PHQ - 2 Score 0 0 0 0 0 0 0  PHQ- 9 Score 3 0 0 - 0 - -    Fall Risk Fall Risk  12/12/2020 11/05/2020 08/06/2020 06/05/2020 03/15/2020  Falls in the past year? 0 0 0 1 0  Number falls in past yr: 0 - - 1 -  Injury with Fall? 0 - - 1 -  Comment - - - - -  Risk for fall due to : Impaired balance/gait;Orthopedic patient;Impaired vision - - History of fall(s);Impaired balance/gait;Impaired mobility -  Follow up Falls prevention discussed;Education provided - - Falls evaluation completed -    FALL RISK PREVENTION PERTAINING TO THE HOME:  Any stairs in or around the home? Yes  If so, are there any without handrails? No  Home free of loose throw rugs in walkways, pet beds, electrical cords, etc? Yes  Adequate lighting in your home to reduce risk of falls? Yes   ASSISTIVE DEVICES UTILIZED TO PREVENT FALLS:  Life alert? No  Use of a cane, walker or w/c? Yes  Grab bars in the bathroom? Yes  Shower chair or bench in shower? Yes  Elevated toilet seat or a handicapped toilet? Yes   TIMED UP AND GO:  Was the test performed? No .  Telephonic visit.  Cognitive Function: Normal cognitive status assessed by direct observation by this Nurse Health Advisor. No abnormalities found.   MMSE - Mini Mental State Exam 06/15/2018 06/10/2017 03/23/2017 10/09/2016 10/05/2016  Not completed: Unable to complete - - - -  Orientation to time - 5 5 5 3   Orientation to Place - 5 5 5 5   Registration - 3 3 3 2   Attention/ Calculation - 5 5 5 5   Recall - 2 3 3 1   Language- name 2 objects - 2 2 2 2   Language- repeat - 1 1 1 1   Language- follow 3 step command - 3 3 3 3   Language- read & follow direction - 1 1 1 1   Write a  sentence - 1 1 1 1   Copy design - 1 1 1 1   Total score - 29 30 30 25      6CIT Screen 12/04/2019  What Year? 0 points  What month? 0 points  What time? 0 points  Count back from 20 0 points  Months in reverse 2 points  Repeat phrase 2 points  Total Score 4    Immunizations Immunization History  Administered Date(s) Administered  . Fluad Quad(high Dose 65+) 04/28/2019, 05/03/2020  . Influenza, High Dose Seasonal PF 05/12/2017, 05/17/2018  . Influenza,inj,Quad PF,6+ Mos 05/14/2014, 05/16/2015, 05/07/2016  . Moderna Sars-Covid-2 Vaccination 09/21/2019, 10/20/2019, 07/01/2020  . Pneumococcal Conjugate-13 10/25/2014  . Pneumococcal Polysaccharide-23 05/18/2007    TDAP status: Due, Education has been provided regarding the importance of this vaccine. Advised may receive this vaccine at local pharmacy or Health Dept. Aware to provide a copy of the vaccination record if obtained from local pharmacy or Health Dept. Verbalized acceptance and understanding.  Flu Vaccine status: Up to date  Pneumococcal vaccine status: Up to date  Covid-19 vaccine status: Completed vaccines  Qualifies for Shingles Vaccine? Yes   Zostavax completed Yes   Shingrix Completed?: No.    Education has been provided regarding  the importance of this vaccine. Patient has been advised to call insurance company to determine out of pocket expense if they have not yet received this vaccine. Advised may also receive vaccine at local pharmacy or Health Dept. Verbalized acceptance and understanding.  Screening Tests Health Maintenance  Topic Date Due  . TETANUS/TDAP  01/26/2018  . INFLUENZA VACCINE  03/17/2021  . COVID-19 Vaccine  Completed  . PNA vac Low Risk Adult  Completed  . HPV VACCINES  Aged Out    Health Maintenance  Health Maintenance Due  Topic Date Due  . TETANUS/TDAP  01/26/2018    Colorectal cancer screening: No longer required.   Lung Cancer Screening: (Low Dose CT Chest recommended if Age  1-80 years, 30 pack-year currently smoking OR have quit w/in 15years.) does not qualify.   Additional Screening:  Hepatitis C Screening: does not qualify  Vision Screening: Recommended annual ophthalmology exams for early detection of glaucoma and other disorders of the eye. Is the patient up to date with their annual eye exam?  Yes  Who is the provider or what is the name of the office in which the patient attends annual eye exams? [facility] If pt is not established with a provider, would they like to be referred to a provider to establish care? No .   Dental Screening: Recommended annual dental exams for proper oral hygiene  Community Resource Referral / Chronic Care Management: CRR required this visit?  No   CCM required this visit?  No      Plan:     I have personally reviewed and noted the following in the patient's chart:   . Medical and social history . Use of alcohol, tobacco or illicit drugs  . Current medications and supplements . Functional ability and status . Nutritional status . Physical activity . Advanced directives . List of other physicians . Hospitalizations, surgeries, and ER visits in previous 12 months . Vitals . Screenings to include cognitive, depression, and falls . Referrals and appointments  In addition, I have reviewed and discussed with patient certain preventive protocols, quality metrics, and best practice recommendations. A written personalized care plan for preventive services as well as general preventive health recommendations were provided to patient.     Sandrea Hammond, LPN   [id number]   Nurse Notes: None

## 2020-12-12 NOTE — Patient Instructions (Signed)
[name] , Thank you for taking time to come for your Medicare Wellness Visit. I appreciate your ongoing commitment to your health goals. Please review the following plan we discussed and let me know if I can assist you in the future.   Screening recommendations/referrals: Colonoscopy: Done 07/19/2017 - Repeat not required Recommended yearly ophthalmology/optometry visit for glaucoma screening and checkup Recommended yearly dental visit for hygiene and checkup  Vaccinations: Influenza vaccine: Done 05/03/2020 - repeat every fall Pneumococcal vaccine: Done 05/18/2007 & 10/25/2014 Tdap vaccine: DUE (every 10 years) Shingles vaccine: Shingrix discussed. Please contact your pharmacy for coverage information.    Covid-19: Done 09/21/19, 10/20/19, & 07/01/2020  Advanced directives: Please bring a copy of your health care power of attorney and living will to the office to be added to your chart at your convenience.  Conditions/risks identified: Aim for 30 minutes of exercise or brisk walking each day, drink 6-8 glasses of water and eat lots of fruits and vegetables.  Next appointment: Follow up in one year for your annual wellness visit.   Preventive Care 55 Years and Older, Male  Preventive care refers to lifestyle choices and visits with your health care provider that can promote health and wellness. What does preventive care include?  A yearly physical exam. This is also called an annual well check.  Dental exams once or twice a year.  Routine eye exams. Ask your health care provider how often you should have your eyes checked.  Personal lifestyle choices, including:  Daily care of your teeth and gums.  Regular physical activity.  Eating a healthy diet.  Avoiding tobacco and drug use.  Limiting alcohol use.  Practicing safe sex.  Taking low doses of aspirin every day.  Taking vitamin and mineral supplements as recommended by your health care provider. What happens during an  annual well check? The services and screenings done by your health care provider during your annual well check will depend on your age, overall health, lifestyle risk factors, and family history of disease. Counseling  Your health care provider may ask you questions about your:  Alcohol use.  Tobacco use.  Drug use.  Emotional well-being.  Home and relationship well-being.  Sexual activity.  Eating habits.  History of falls.  Memory and ability to understand (cognition).  Work and work Statistician. Screening  You may have the following tests or measurements:  Height, weight, and BMI.  Blood pressure.  Lipid and cholesterol levels. These may be checked every 5 years, or more frequently if you are over 75 years old.  Skin check.  Lung cancer screening. You may have this screening every year starting at age 53 if you have a 30-pack-year history of smoking and currently smoke or have quit within the past 15 years.  Fecal occult blood test (FOBT) of the stool. You may have this test every year starting at age 57.  Flexible sigmoidoscopy or colonoscopy. You may have a sigmoidoscopy every 5 years or a colonoscopy every 10 years starting at age 56.  Prostate cancer screening. Recommendations will vary depending on your family history and other risks.  Hepatitis C blood test.  Hepatitis B blood test.  Sexually transmitted disease (STD) testing.  Diabetes screening. This is done by checking your blood sugar (glucose) after you have not eaten for a while (fasting). You may have this done every 1-3 years.  Abdominal aortic aneurysm (AAA) screening. You may need this if you are a current or former smoker.  Osteoporosis. You  may be screened starting at age 29 if you are at high risk. Talk with your health care provider about your test results, treatment options, and if necessary, the need for more tests. Vaccines  Your health care provider may recommend certain vaccines,  such as:  Influenza vaccine. This is recommended every year.  Tetanus, diphtheria, and acellular pertussis (Tdap, Td) vaccine. You may need a Td booster every 10 years.  Zoster vaccine. You may need this after age 39.  Pneumococcal 13-valent conjugate (PCV13) vaccine. One dose is recommended after age 35.  Pneumococcal polysaccharide (PPSV23) vaccine. One dose is recommended after age 12. Talk to your health care provider about which screenings and vaccines you need and how often you need them. This information is not intended to replace advice given to you by your health care provider. Make sure you discuss any questions you have with your health care provider. Document Released: 08/30/2015 Document Revised: 04/22/2016 Document Reviewed: 06/04/2015 Elsevier Interactive Patient Education  2017 [place] Prevention in the Home Falls can cause injuries. They can happen to people of all ages. There are many things you can do to make your home safe and to help prevent falls. What can I do on the outside of my home?  Regularly fix the edges of walkways and driveways and fix any cracks.  Remove anything that might make you trip as you walk through a door, such as a raised step or threshold.  Trim any bushes or trees on the path to your home.  Use bright outdoor lighting.  Clear any walking paths of anything that might make someone trip, such as rocks or tools.  Regularly check to see if handrails are loose or broken. Make sure that both sides of any steps have handrails.  Any raised decks and porches should have guardrails on the edges.  Have any leaves, snow, or ice cleared regularly.  Use sand or salt on walking paths during winter.  Clean up any spills in your garage right away. This includes oil or grease spills. What can I do in the bathroom?  Use night lights.  Install grab bars by the toilet and in the tub and shower. Do not use towel bars as grab bars.  Use  non-skid mats or decals in the tub or shower.  If you need to sit down in the shower, use a plastic, non-slip stool.  Keep the floor dry. Clean up any water that spills on the floor as soon as it happens.  Remove soap buildup in the tub or shower regularly.  Attach bath mats securely with double-sided non-slip rug tape.  Do not have throw rugs and other things on the floor that can make you trip. What can I do in the bedroom?  Use night lights.  Make sure that you have a light by your bed that is easy to reach.  Do not use any sheets or blankets that are too big for your bed. They should not hang down onto the floor.  Have a firm chair that has side arms. You can use this for support while you get dressed.  Do not have throw rugs and other things on the floor that can make you trip. What can I do in the kitchen?  Clean up any spills right away.  Avoid walking on wet floors.  Keep items that you use a lot in easy-to-reach places.  If you need to reach something above you, use a strong step stool that  has a grab bar.  Keep electrical cords out of the way.  Do not use floor polish or wax that makes floors slippery. If you must use wax, use non-skid floor wax.  Do not have throw rugs and other things on the floor that can make you trip. What can I do with my stairs?  Do not leave any items on the stairs.  Make sure that there are handrails on both sides of the stairs and use them. Fix handrails that are broken or loose. Make sure that handrails are as long as the stairways.  Check any carpeting to make sure that it is firmly attached to the stairs. Fix any carpet that is loose or worn.  Avoid having throw rugs at the top or bottom of the stairs. If you do have throw rugs, attach them to the floor with carpet tape.  Make sure that you have a light switch at the top of the stairs and the bottom of the stairs. If you do not have them, ask someone to add them for you. What  else can I do to help prevent falls?  Wear shoes that:  Do not have high heels.  Have rubber bottoms.  Are comfortable and fit you well.  Are closed at the toe. Do not wear sandals.  If you use a stepladder:  Make sure that it is fully opened. Do not climb a closed stepladder.  Make sure that both sides of the stepladder are locked into place.  Ask someone to hold it for you, if possible.  Clearly mark and make sure that you can see:  Any grab bars or handrails.  First and last steps.  Where the edge of each step is.  Use tools that help you move around (mobility aids) if they are needed. These include:  Canes.  Walkers.  Scooters.  Crutches.  Turn on the lights when you go into a dark area. Replace any light bulbs as soon as they burn out.  Set up your furniture so you have a clear path. Avoid moving your furniture around.  If any of your floors are uneven, fix them.  If there are any pets around you, be aware of where they are.  Review your medicines with your doctor. Some medicines can make you feel dizzy. This can increase your chance of falling. Ask your doctor what other things that you can do to help prevent falls. This information is not intended to replace advice given to you by your health care provider. Make sure you discuss any questions you have with your health care provider. Document Released: 05/30/2009 Document Revised: 01/09/2016 Document Reviewed: 09/07/2014 Elsevier Interactive Patient Education  2017 [employer].

## 2020-12-22 DIAGNOSIS — R0689 Other abnormalities of breathing: Secondary | ICD-10-CM | POA: Diagnosis not present

## 2020-12-22 DIAGNOSIS — Z7982 Long term (current) use of aspirin: Secondary | ICD-10-CM | POA: Diagnosis not present

## 2020-12-22 DIAGNOSIS — I63411 Cerebral infarction due to embolism of right middle cerebral artery: Secondary | ICD-10-CM | POA: Diagnosis not present

## 2020-12-22 DIAGNOSIS — I6782 Cerebral ischemia: Secondary | ICD-10-CM | POA: Diagnosis not present

## 2020-12-22 DIAGNOSIS — I639 Cerebral infarction, unspecified: Secondary | ICD-10-CM | POA: Insufficient documentation

## 2020-12-22 DIAGNOSIS — I10 Essential (primary) hypertension: Secondary | ICD-10-CM | POA: Diagnosis not present

## 2020-12-22 DIAGNOSIS — R0989 Other specified symptoms and signs involving the circulatory and respiratory systems: Secondary | ICD-10-CM | POA: Diagnosis not present

## 2020-12-22 DIAGNOSIS — Z743 Need for continuous supervision: Secondary | ICD-10-CM | POA: Diagnosis not present

## 2020-12-22 DIAGNOSIS — F32A Depression, unspecified: Secondary | ICD-10-CM | POA: Diagnosis not present

## 2020-12-22 DIAGNOSIS — Z9282 Status post administration of tPA (rtPA) in a different facility within the last 24 hours prior to admission to current facility: Secondary | ICD-10-CM | POA: Diagnosis not present

## 2020-12-22 DIAGNOSIS — E876 Hypokalemia: Secondary | ICD-10-CM | POA: Diagnosis not present

## 2020-12-22 DIAGNOSIS — K219 Gastro-esophageal reflux disease without esophagitis: Secondary | ICD-10-CM | POA: Diagnosis not present

## 2020-12-22 DIAGNOSIS — R29810 Facial weakness: Secondary | ICD-10-CM | POA: Diagnosis not present

## 2020-12-22 DIAGNOSIS — I4510 Unspecified right bundle-branch block: Secondary | ICD-10-CM | POA: Diagnosis not present

## 2020-12-22 DIAGNOSIS — I059 Rheumatic mitral valve disease, unspecified: Secondary | ICD-10-CM | POA: Diagnosis not present

## 2020-12-22 DIAGNOSIS — R471 Dysarthria and anarthria: Secondary | ICD-10-CM | POA: Diagnosis not present

## 2020-12-22 DIAGNOSIS — R4781 Slurred speech: Secondary | ICD-10-CM | POA: Diagnosis not present

## 2020-12-22 DIAGNOSIS — E039 Hypothyroidism, unspecified: Secondary | ICD-10-CM | POA: Diagnosis not present

## 2020-12-22 DIAGNOSIS — I6501 Occlusion and stenosis of right vertebral artery: Secondary | ICD-10-CM | POA: Diagnosis not present

## 2020-12-22 DIAGNOSIS — E785 Hyperlipidemia, unspecified: Secondary | ICD-10-CM | POA: Diagnosis not present

## 2020-12-22 DIAGNOSIS — G8324 Monoplegia of upper limb affecting left nondominant side: Secondary | ICD-10-CM | POA: Diagnosis not present

## 2020-12-22 DIAGNOSIS — I482 Chronic atrial fibrillation, unspecified: Secondary | ICD-10-CM | POA: Diagnosis not present

## 2020-12-22 DIAGNOSIS — I4891 Unspecified atrial fibrillation: Secondary | ICD-10-CM | POA: Diagnosis not present

## 2020-12-22 DIAGNOSIS — I6623 Occlusion and stenosis of bilateral posterior cerebral arteries: Secondary | ICD-10-CM | POA: Diagnosis not present

## 2020-12-22 DIAGNOSIS — I071 Rheumatic tricuspid insufficiency: Secondary | ICD-10-CM | POA: Diagnosis not present

## 2020-12-22 DIAGNOSIS — I358 Other nonrheumatic aortic valve disorders: Secondary | ICD-10-CM | POA: Diagnosis not present

## 2020-12-22 DIAGNOSIS — I499 Cardiac arrhythmia, unspecified: Secondary | ICD-10-CM | POA: Diagnosis not present

## 2020-12-22 DIAGNOSIS — R29818 Other symptoms and signs involving the nervous system: Secondary | ICD-10-CM | POA: Diagnosis not present

## 2020-12-22 DIAGNOSIS — D72829 Elevated white blood cell count, unspecified: Secondary | ICD-10-CM | POA: Diagnosis not present

## 2020-12-22 DIAGNOSIS — Z79899 Other long term (current) drug therapy: Secondary | ICD-10-CM | POA: Diagnosis not present

## 2020-12-22 DIAGNOSIS — I672 Cerebral atherosclerosis: Secondary | ICD-10-CM | POA: Diagnosis not present

## 2020-12-22 DIAGNOSIS — R9431 Abnormal electrocardiogram [ECG] [EKG]: Secondary | ICD-10-CM | POA: Diagnosis not present

## 2020-12-22 DIAGNOSIS — Z87891 Personal history of nicotine dependence: Secondary | ICD-10-CM | POA: Diagnosis not present

## 2020-12-25 ENCOUNTER — Telehealth: Payer: Self-pay

## 2020-12-26 NOTE — Telephone Encounter (Signed)
Appointment scheduled 01/01/21 with Chevis Pretty.

## 2020-12-30 ENCOUNTER — Telehealth: Payer: Self-pay

## 2020-12-30 NOTE — Telephone Encounter (Signed)
Transition Care Management Follow-up Telephone Call  Date of discharge and from where: 12/27/20 [facility]  How have you been since you were released from the hospital? He is okay, but still weak and speech is slurred  Any questions or concerns? No  Items Reviewed:  Did the pt receive and understand the discharge instructions provided? Yes   Medications obtained and verified? Yes   Other? No   Any new allergies since your discharge? No   Dietary orders reviewed? Yes  Do you have support at home? Yes   Home Care and Equipment/Supplies: Were home health services ordered? No, but he needs it - they wanted him to come back to [facility] outpatient rehab, but wife says that is too far - she hopes we can set up [place] [facility] [place] - he is able to do most things on his own, but balance/gait is off and speech is slurred If so, what is the name of the agency? n/a  Has the agency set up a time to come to the patient's home? not applicable Were any new equipment or medical supplies ordered?  No What is the name of the medical supply agency? n/a Were you able to get the supplies/equipment? not applicable Do you have any questions related to the use of the equipment or supplies? No  Functional Questionnaire: (I = Independent and D = Dependent)  ADLs: I  Bathing/Dressing- I  Meal Prep- I  Eating- I  Maintaining continence- I  Transferring/Ambulation- I with walker (L side affected)  Managing Meds- I  Follow up appointments reviewed:   PCP Hospital f/u appt confirmed? Yes  Scheduled to see Shelah Lewandowsky on 01/01/21 @ 11:45.  [facility] f/u appt confirmed? No    Are transportation arrangements needed? No   If their condition worsens, is the pt aware to call PCP or go to the Emergency Dept.? Yes  Was the patient provided with contact information for the PCP's office or ED? Yes  Was to pt encouraged to call back with questions or concerns? Yes

## 2021-01-01 ENCOUNTER — Encounter: Payer: Self-pay | Admitting: Nurse Practitioner

## 2021-01-01 ENCOUNTER — Ambulatory Visit (INDEPENDENT_AMBULATORY_CARE_PROVIDER_SITE_OTHER): Payer: Medicare Other | Admitting: Nurse Practitioner

## 2021-01-01 VITALS — BP 129/72 | HR 67 | Temp 98.0°F | Resp 20 | Ht 66.0 in | Wt 160.0 lb

## 2021-01-01 DIAGNOSIS — E785 Hyperlipidemia, unspecified: Secondary | ICD-10-CM | POA: Diagnosis not present

## 2021-01-01 DIAGNOSIS — G10 Huntington's disease: Secondary | ICD-10-CM

## 2021-01-01 DIAGNOSIS — F3342 Major depressive disorder, recurrent, in full remission: Secondary | ICD-10-CM | POA: Diagnosis not present

## 2021-01-01 DIAGNOSIS — I6930 Unspecified sequelae of cerebral infarction: Secondary | ICD-10-CM | POA: Diagnosis not present

## 2021-01-01 DIAGNOSIS — Z09 Encounter for follow-up examination after completed treatment for conditions other than malignant neoplasm: Secondary | ICD-10-CM | POA: Diagnosis not present

## 2021-01-01 NOTE — Progress Notes (Signed)
Subjective:    Patient ID: Sergio Baker, male    DOB: 08/06/1935, 86 y.o.   MRN: [id number]   Chief Complaint: Hospitalization Follow-up   HPI Patient comes in today for hospital follow up. He was admitted to the hospital  on 12/22/20 with ischemic stroke. Started on eliquis. Is getting PT and OT at home. He had returned to his baseline prior to discharge from hospital. His wife has ben taking care of him since he came home. She says he is doing well. 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  questions- - back on haldol 1/2 daily - celexa only taking 1/2 tablet daily - should he take lipitor 80 daily  Review of Systems  Constitutional: Negative for diaphoresis.  Eyes: Negative for pain.  Respiratory: Negative for shortness of breath.   Cardiovascular: Negative for chest pain, palpitations and leg swelling.  Gastrointestinal: Negative for abdominal pain.  Endocrine: Negative for polydipsia.  Musculoskeletal: Positive for gait problem.  Skin: Negative for rash.  Neurological: Negative for dizziness, weakness and headaches.  Hematological: Does not bruise/bleed easily.  All other systems reviewed and are negative.      Objective:   Physical Exam Vitals and nursing note reviewed.  Constitutional:      Appearance: Normal appearance. He is well-developed.  HENT:     Head: Normocephalic.     Nose: Nose normal.  Eyes:     Pupils: Pupils are equal, round, and reactive to light.  Neck:     Thyroid: No thyroid mass or thyromegaly.     Vascular: No carotid bruit or JVD.     Trachea: Phonation normal.  Cardiovascular:     Rate and Rhythm: Normal rate and regular rhythm.  Pulmonary:     Effort: Pulmonary effort is normal. No respiratory distress.     Breath sounds: Normal breath sounds.  Abdominal:     General: Bowel sounds are normal.     Palpations: Abdomen is soft.     Tenderness: There is no abdominal tenderness.  Musculoskeletal:         General: Normal range of motion.     Cervical back: Normal range of motion and neck supple.     Comments: Motor stregth and sensation of upper and loer ext equal bil.  Lymphadenopathy:     Cervical: No cervical adenopathy.  Skin:    General: Skin is warm and dry.  Neurological:     General: No focal deficit present.     Mental Status: He is alert and oriented to person, place, and time.     Cranial Nerves: No cranial nerve deficit.     Sensory: No sensory deficit.     Motor: No weakness.     Coordination: Coordination abnormal.     Gait: Gait abnormal.     Comments: Walking with walker  Psychiatric:        Behavior: Behavior normal.        Thought Content: Thought content normal.        Judgment: Judgment normal.    BP 129/72   Pulse 67   Temp 98 F (36.7 C) (Temporal)   Resp 20   Ht 5' 6"  (1.676 m)   Wt 160 lb (72.6 kg)   BMI 25.82 kg/m       Assessment & Plan:  Sergio Baker in today with chief complaint of Hospitalization Follow-up   1. Late effect of cerebrovascular accident (CVA) Family refuses  PT at home for now 'unsteady gait is more due to huntingtons then CVA and they do not think it will help. They will let me know if they change their mind. Going to try to get patient help with eliquis  2. Hospital discharge follow-up Hospital records reviewed  3. Huntington's chorea (HCC) Increase halidol to 1/2 tablet BID  4. Recurrent major depressive disorder, in full remission ([place]) Increase celexa to 1 tablet daily  5. Hyperlipidemia with target LDL less than 100 Continue lipitor at 75m daily Low fat diet    The above assessment and management plan was discussed with the patient. The patient verbalized understanding of and has agreed to the management plan. Patient is aware to call the clinic if symptoms persist or worsen. Patient is aware when to return to the clinic for a follow-up visit. Patient educated on when it is appropriate to go to the  emergency department.   Mary-Margaret MHassell Done FNP

## 2021-01-01 NOTE — Patient Instructions (Signed)
Fall Prevention in the Home, Adult Falls can cause injuries and can happen to people of all ages. There are many things you can do to make your home safe and to help prevent falls. Ask for help when making these changes. What actions can I take to prevent falls? General Instructions  Use good lighting in all rooms. Replace any light bulbs that burn out.  Turn on the lights in dark areas. Use night-lights.  Keep items that you use often in easy-to-reach places. Lower the shelves around your home if needed.  Set up your furniture so you have a clear path. Avoid moving your furniture around.  Do not have throw rugs or other things on the floor that can make you trip.  Avoid walking on wet floors.  If any of your floors are uneven, fix them.  Add color or contrast paint or tape to clearly mark and help you see: ? Grab bars or handrails. ? First and last steps of staircases. ? Where the edge of each step is.  If you use a stepladder: ? Make sure that it is fully opened. Do not climb a closed stepladder. ? Make sure the sides of the stepladder are locked in place. ? Ask someone to hold the stepladder while you use it.  Know where your pets are when moving through your home. What can I do in the bathroom?  Keep the floor dry. Clean up any water on the floor right away.  Remove soap buildup in the tub or shower.  Use nonskid mats or decals on the floor of the tub or shower.  Attach bath mats securely with double-sided, nonslip rug tape.  If you need to sit down in the shower, use a plastic, nonslip stool.  Install grab bars by the toilet and in the tub and shower. Do not use towel bars as grab bars.      What can I do in the bedroom?  Make sure that you have a light by your bed that is easy to reach.  Do not use any sheets or blankets for your bed that hang to the floor.  Have a firm chair with side arms that you can use for support when you get dressed. What can I do in  the kitchen?  Clean up any spills right away.  If you need to reach something above you, use a step stool with a grab bar.  Keep electrical cords out of the way.  Do not use floor polish or wax that makes floors slippery. What can I do with my stairs?  Do not leave any items on the stairs.  Make sure that you have a light switch at the top and the bottom of the stairs.  Make sure that there are handrails on both sides of the stairs. Fix handrails that are broken or loose.  Install nonslip stair treads on all your stairs.  Avoid having throw rugs at the top or bottom of the stairs.  Choose a carpet that does not hide the edge of the steps on the stairs.  Check carpeting to make sure that it is firmly attached to the stairs. Fix carpet that is loose or worn. What can I do on the outside of my home?  Use bright outdoor lighting.  Fix the edges of walkways and driveways and fix any cracks.  Remove anything that might make you trip as you walk through a door, such as a raised step or threshold.  Trim any   bushes or trees on paths to your home.  Check to see if handrails are loose or broken and that both sides of all steps have handrails.  Install guardrails along the edges of any raised decks and porches.  Clear paths of anything that can make you trip, such as tools or rocks.  Have leaves, snow, or ice cleared regularly.  Use sand or salt on paths during winter.  Clean up any spills in your garage right away. This includes grease or oil spills. What other actions can I take?  Wear shoes that: ? Have a low heel. Do not wear high heels. ? Have rubber bottoms. ? Feel good on your feet and fit well. ? Are closed at the toe. Do not wear open-toe sandals.  Use tools that help you move around if needed. These include: ? Canes. ? Walkers. ? Scooters. ? Crutches.  Review your medicines with your doctor. Some medicines can make you feel dizzy. This can increase your chance  of falling. Ask your doctor what else you can do to help prevent falls. Where to find more information  Centers for Disease Control and Prevention, [employer]: www.cdc.gov  National Institute on Aging: www.nia.nih.gov Contact a doctor if:  You are afraid of falling at home.  You feel weak, drowsy, or dizzy at home.  You fall at home. Summary  There are many simple things that you can do to make your home safe and to help prevent falls.  Ways to make your home safe include removing things that can make you trip and installing grab bars in the bathroom.  Ask for help when making these changes in your home. This information is not intended to replace advice given to you by your health care provider. Make sure you discuss any questions you have with your health care provider. Document Revised: 03/06/2020 Document Reviewed: 03/06/2020 Elsevier Patient Education  2021 [employer] Inc[employer]  

## 2021-01-02 DIAGNOSIS — Z23 Encounter for immunization: Secondary | ICD-10-CM | POA: Diagnosis not present

## 2021-01-06 ENCOUNTER — Ambulatory Visit (INDEPENDENT_AMBULATORY_CARE_PROVIDER_SITE_OTHER): Payer: Medicare Other | Admitting: *Deleted

## 2021-01-06 ENCOUNTER — Encounter: Payer: Self-pay | Admitting: *Deleted

## 2021-01-06 DIAGNOSIS — F3342 Major depressive disorder, recurrent, in full remission: Secondary | ICD-10-CM

## 2021-01-06 DIAGNOSIS — I4821 Permanent atrial fibrillation: Secondary | ICD-10-CM

## 2021-01-06 DIAGNOSIS — I6930 Unspecified sequelae of cerebral infarction: Secondary | ICD-10-CM

## 2021-01-06 NOTE — Patient Instructions (Signed)
Visit Information  PATIENT GOALS: Goals Addressed            This Visit's Progress   . Improve Strength and Minimize Residual Deficits       Timeframe:  Long-Range Goal Priority:  Medium Start Date:   01/06/21                          Expected End Date:  08/16/21                      Follow Up Date 02/05/21   . Talk with PharmD regarding prescription assistance for Eliquis . Keep all medical appointments . Take all medications as prescribed . Use walker for all ambulation . Move carefully and change positions slowly to minimize fall risks . Call RN Care Manager if needed . Call 911 for any stroke symptoms   Why is this important?    Before the stroke you probably did not think much about being safe when you are up and about.   Now, it may be harder for you to get around.   It may also be easier for you to trip or fall.   It is common to have muscle weakness after a stroke. You may also feel like you cannot control an arm or leg.   It will be helpful to work with a physical therapist to get your strength and muscle control back.   It is good to stay as active as you can. Walking and stretching help you stay strong and flexible.   The physical therapist will develop an exercise program just for you.     Notes:        Patient verbalizes understanding of instructions provided today and agrees to view in .   Follow Up Plan:  . Telephone follow up appointment with care management team member scheduled for: 02/05/21 with RNCM . The patient has been provided with contact information for the care management team and has been advised to call with any health related questions or concerns.   Chong Sicilian, BSN, RN-BC Embedded Chronic Care Manager [facility] [place] Management Direct Dial: [phone]

## 2021-01-06 NOTE — Chronic Care Management (AMB) (Signed)
Chronic Care Management   CCM RN Visit Note  01/06/2021 Name: Sergio Baker MRN: [id number] DOB: 17-Feb-1935  Subjective: Sergio Baker is a 86 y.o. year old male who is a primary care patient of Chevis Pretty, FNP. The care management team was consulted for assistance with disease management and care coordination needs.    Engaged with patient's wife, [name], by telephone for follow up visit in response to provider referral for case management and/or care coordination services.   Consent to Services:  The patient was given information about Chronic Care Management services, agreed to services, and gave verbal consent prior to initiation of services.  Please see initial visit note for detailed documentation.   Patient agreed to services and verbal consent obtained.   Assessment: Review of patient past medical history, allergies, medications, health status, including review of consultants reports, laboratory and other test data, was performed as part of comprehensive evaluation and provision of chronic care management services.   SDOH (Social Determinants of Health) assessments and interventions performed:    CCM Care Plan  No Known Allergies  Outpatient Encounter Medications as of 01/06/2021  Medication Sig Note  . acetaminophen (TYLENOL) 650 MG CR tablet Take 1,300 mg by mouth daily as needed for pain.   . Aloe Vera 25 MG CAPS Take 25 mg by mouth daily.   Marland Kitchen atorvastatin (LIPITOR) 80 MG tablet Take 1 tablet by mouth daily.   Marland Kitchen CALCIUM CITRATE PO Take 1 tablet by mouth daily.   . Cholecalciferol (VITAMIN D3) 2000 UNITS capsule Take 2,000 Units by mouth daily.   . citalopram (CELEXA) 20 MG tablet Take 1 tablet (20 mg total) by mouth daily.   Marland Kitchen ELIQUIS 5 MG TABS tablet Take 1 tablet by mouth 2 (two) times daily.   . Glucosamine-Chondroit-Vit C-Mn (GLUCOSAMINE CHONDR 1500 COMPLX PO) Take 1 tablet by mouth daily.   . haloperidol (HALDOL) 1 MG tablet Take 1.5 tablets (1.5 mg  total) by mouth 2 (two) times daily.   Marland Kitchen levothyroxine (SYNTHROID) 50 MCG tablet Take 1 tablet (50 mcg total) by mouth daily before breakfast.   . lisinopril-hydrochlorothiazide (ZESTORETIC) 20-12.5 MG tablet Take 1 tablet by mouth daily. 12/30/2020: TAKING 10/12.5MG  . Methylsulfonylmethane (MSM) 1000 MG CAPS Take 1,000 mg by mouth daily.   . metoprolol succinate (TOPROL-XL) 25 MG 24 hr tablet Take 1 tablet by mouth daily.   . Multiple Vitamin (MULTIVITAMIN WITH MINERALS) TABS Take 1 tablet by mouth daily. For Men 50+   . mupirocin ointment (BACTROBAN) 2 %    . Omega-3 Fatty Acids (FISH OIL PO) Take 1 tablet by mouth daily.   Marland Kitchen omeprazole (PRILOSEC) 20 MG capsule Take 1 capsule (20 mg total) by mouth daily.    No facility-administered encounter medications on file as of 01/06/2021.    Patient Active Problem List   Diagnosis Date Noted  . Essential hypertension 12/01/2019  . Irregular heart beat 12/01/2019  . Atrial fibrillation ([place]) 12/01/2019  . Gastroesophageal reflux disease 09/01/2019  . BPH (benign prostatic hyperplasia) 12/23/2015  . BMI 27.0-27.9,adult 09/06/2015  . Hypothyroidism 01/18/2014  . Hyperlipidemia with target LDL less than 100 01/18/2014  . Depression 01/18/2014  . Memory deficits 01/17/2014  . Abnormality of gait 05/23/2012  . Huntington's chorea ([place]) 05/23/2012  . Diverticulosis of large intestine 03/08/2009    Conditions to be addressed/monitored: HTN, Afib, ulcerative colitis, hypothyroidism, Huntington's Chorea, s/p CVA  Care Plan : Stroke (Adult)  Updates made by Ilean China, RN since 01/06/2021  12:00 AM    Problem: Residual Deficits (Stroke)   Priority: Medium    Long-Range Goal: Residual Deficits Prevented or Minimized   Start Date: 01/06/2021  This Visit's Progress: On track  Priority: Medium  Note:   Current Barriers:  Marland Kitchen Knowledge Deficits related to prescription assistance for Eliquis . Chronic Disease Management support and education  needs related to ischemic stroke due to Afib . Unable to independently drive  Nurse Case Manager Clinical Goal(s):  . patient will meet with RN Care Manager to address needs related to effects of CVA . the patient will demonstrate ongoing self health care management ability as evidenced by taking medications as prescribed and by keeping all medical appointments*  Interventions:  . 1:1 collaboration with Chevis Pretty, FNP regarding development and update of comprehensive plan of care as evidenced by provider attestation and co-signature . Inter-disciplinary care team collaboration (see longitudinal plan of care) . Chart reviewed including relevant office notes, correspondence notes, lab results, and imaging reports . Evaluation of current treatment plan related to late effects of CVA and patient's adherence to plan as established by provider. . Reviewed medications with patient and discussed importance of medication adherence . Discussed cost of Eliquis and BMS requirements for prescription assistance . Reviewed upcoming appointment PharmD for help with prescription assistance o Advised to bring proof of income and printout from pharmacy with out-of-pocket medication expenses for the year  Evaluated for changes in function   Discussed recommendation for PT/OT but patient/wife declined because of his pre-existing Huntington's that already impacts his strength and mobility  Reviewed use of assistive devices and encouraged use of walker for all ambulation  Assessed impact on wife and caregiver strain  Discussed family/social support  Reviewed upcoming appointments  Discussed psychological impact of CVA  Discussed fatigue and importance of getting enough rest  Discussed physical activity  Patient is walking with walker through the house and up and down the ramp outdoors  Discussed fall prevention and fall prevention strategies  Reviewed signs/symptoms of a stroke with wife.  She is aware of them.  Reinforced need to call 911 immediately if he exhibits any of those symptoms  Provided with RN Care Manager contact information and encouraged to reach out as needed  Discussed plans with patient for ongoing care management follow up and provided patient with direct contact information for care management team  Self Care Activities:  . Self administers medications as prescribed . Attends all scheduled provider appointments  Patient Goals Over the next 45 days, patient will: . Talk with PharmD regarding prescription assistance for Eliquis . Keep all medical appointments . Take all medications as prescribed . Use walker for all ambulation . Move carefully and change positions slowly to minimize fall risks . Call RN Care Manager if needed . Call 911 for any stroke symptoms    Follow Up Plan:  . Telephone follow up appointment with care management team member scheduled for: 02/05/21 with RNCM . The patient has been provided with contact information for the care management team and has been advised to call with any health related questions or concerns.   Chong Sicilian, BSN, RN-BC Embedded Chronic Care Manager [facility] [place] [facility] Direct Dial: [phone]

## 2021-01-15 DIAGNOSIS — C44529 Squamous cell carcinoma of skin of other part of trunk: Secondary | ICD-10-CM | POA: Diagnosis not present

## 2021-01-17 ENCOUNTER — Telehealth: Payer: Self-pay | Admitting: Pharmacist

## 2021-01-17 ENCOUNTER — Ambulatory Visit: Payer: Medicare Other | Admitting: Pharmacist

## 2021-01-17 NOTE — Telephone Encounter (Signed)
pt doesn't qualify for eliquis assistance SAMPLES LEFT UP FRONT

## 2021-01-21 ENCOUNTER — Other Ambulatory Visit: Payer: Self-pay | Admitting: Nurse Practitioner

## 2021-01-21 ENCOUNTER — Telehealth: Payer: Self-pay | Admitting: Nurse Practitioner

## 2021-01-21 NOTE — Telephone Encounter (Signed)
Yes he needs to continue both medications

## 2021-01-21 NOTE — Telephone Encounter (Signed)
Pt's wife calling to find out if the pt needs to continue taking the medicine that he was prescribed in the hospital. The two medcines that were precribed to the pt in the hospital - metoprolol succinate and atorvastatin - but they want to be sure that MMM wants him to continue taking. Please call back and advise.

## 2021-02-05 ENCOUNTER — Other Ambulatory Visit: Payer: Medicare Other

## 2021-02-05 ENCOUNTER — Other Ambulatory Visit: Payer: Self-pay

## 2021-02-05 ENCOUNTER — Telehealth: Payer: Medicare Other | Admitting: *Deleted

## 2021-02-05 DIAGNOSIS — I10 Essential (primary) hypertension: Secondary | ICD-10-CM

## 2021-02-05 DIAGNOSIS — E785 Hyperlipidemia, unspecified: Secondary | ICD-10-CM

## 2021-02-06 LAB — CMP14+EGFR
ALT: 19 IU/L (ref 0–44)
AST: 26 IU/L (ref 0–40)
Albumin/Globulin Ratio: 1.8 (ref 1.2–2.2)
Albumin: 4.5 g/dL (ref 3.6–4.6)
Alkaline Phosphatase: 90 IU/L (ref 44–121)
BUN/Creatinine Ratio: 16 (ref 10–24)
BUN: 16 mg/dL (ref 8–27)
Bilirubin Total: 1.1 mg/dL (ref 0.0–1.2)
CO2: 30 mmol/L — ABNORMAL HIGH (ref 20–29)
Calcium: 9.3 mg/dL (ref 8.6–10.2)
Chloride: 95 mmol/L — ABNORMAL LOW (ref 96–106)
Creatinine, Ser: 1 mg/dL (ref 0.76–1.27)
Globulin, Total: 2.5 g/dL (ref 1.5–4.5)
Glucose: 117 mg/dL — ABNORMAL HIGH (ref 65–99)
Potassium: 3.6 mmol/L (ref 3.5–5.2)
Sodium: 141 mmol/L (ref 134–144)
Total Protein: 7 g/dL (ref 6.0–8.5)
eGFR: 74 mL/min/{1.73_m2} (ref >59)

## 2021-02-06 LAB — CBC WITH DIFFERENTIAL/PLATELET
Basophils Absolute: 0.1 10*3/uL (ref 0.0–0.2)
Basos: 1 %
EOS (ABSOLUTE): 0.2 10*3/uL (ref 0.0–0.4)
Eos: 3 %
Hematocrit: 43.7 % (ref 37.5–51.0)
Hemoglobin: 15.1 g/dL (ref 13.0–17.7)
Immature Grans (Abs): 0 10*3/uL (ref 0.0–0.1)
Immature Granulocytes: 0 %
Lymphocytes Absolute: 1.8 10*3/uL (ref 0.7–3.1)
Lymphs: 24 %
MCH: 32.8 pg (ref 26.6–33.0)
MCHC: 34.6 g/dL (ref 31.5–35.7)
MCV: 95 fL (ref 79–97)
Monocytes Absolute: 0.8 10*3/uL (ref 0.1–0.9)
Monocytes: 11 %
Neutrophils Absolute: 4.5 10*3/uL (ref 1.4–7.0)
Neutrophils: 61 %
Platelets: 231 10*3/uL (ref 150–450)
RBC: 4.6 x10E6/uL (ref 4.14–5.80)
RDW: 12.4 % (ref 11.6–15.4)
WBC: 7.4 10*3/uL (ref 3.4–10.8)

## 2021-02-06 LAB — LIPID PANEL
Chol/HDL Ratio: 3.1 ratio (ref 0.0–5.0)
Cholesterol, Total: 111 mg/dL (ref 100–199)
HDL: 36 mg/dL — ABNORMAL LOW (ref >39)
LDL Chol Calc (NIH): 58 mg/dL (ref 0–99)
Triglycerides: 86 mg/dL (ref 0–149)
VLDL Cholesterol Cal: 17 mg/dL (ref 5–40)

## 2021-02-10 ENCOUNTER — Other Ambulatory Visit: Payer: Self-pay

## 2021-02-10 ENCOUNTER — Ambulatory Visit (INDEPENDENT_AMBULATORY_CARE_PROVIDER_SITE_OTHER): Payer: Medicare Other | Admitting: Nurse Practitioner

## 2021-02-10 ENCOUNTER — Encounter: Payer: Self-pay | Admitting: Nurse Practitioner

## 2021-02-10 VITALS — BP 129/63 | HR 58 | Temp 97.4°F | Resp 20 | Ht 66.0 in | Wt 154.0 lb

## 2021-02-10 DIAGNOSIS — I10 Essential (primary) hypertension: Secondary | ICD-10-CM

## 2021-02-10 DIAGNOSIS — I4821 Permanent atrial fibrillation: Secondary | ICD-10-CM | POA: Diagnosis not present

## 2021-02-10 DIAGNOSIS — E034 Atrophy of thyroid (acquired): Secondary | ICD-10-CM

## 2021-02-10 DIAGNOSIS — F3342 Major depressive disorder, recurrent, in full remission: Secondary | ICD-10-CM

## 2021-02-10 DIAGNOSIS — K5730 Diverticulosis of large intestine without perforation or abscess without bleeding: Secondary | ICD-10-CM

## 2021-02-10 DIAGNOSIS — K219 Gastro-esophageal reflux disease without esophagitis: Secondary | ICD-10-CM | POA: Diagnosis not present

## 2021-02-10 DIAGNOSIS — E785 Hyperlipidemia, unspecified: Secondary | ICD-10-CM

## 2021-02-10 DIAGNOSIS — G10 Huntington's disease: Secondary | ICD-10-CM

## 2021-02-10 DIAGNOSIS — R269 Unspecified abnormalities of gait and mobility: Secondary | ICD-10-CM

## 2021-02-10 DIAGNOSIS — R413 Other amnesia: Secondary | ICD-10-CM

## 2021-02-10 DIAGNOSIS — Z6824 Body mass index (BMI) 24.0-24.9, adult: Secondary | ICD-10-CM

## 2021-02-10 MED ORDER — CITALOPRAM HYDROBROMIDE 20 MG PO TABS: 20.0000 mg | ORAL_TABLET | Freq: Every day | ORAL | 1 refills | Status: DC

## 2021-02-10 MED ORDER — METOPROLOL SUCCINATE ER 25 MG PO TB24: 25.0000 mg | ORAL_TABLET | Freq: Every day | ORAL | 1 refills | Status: DC

## 2021-02-10 MED ORDER — OMEPRAZOLE 20 MG PO CPDR: 20.0000 mg | DELAYED_RELEASE_CAPSULE | Freq: Every day | ORAL | 1 refills | Status: DC

## 2021-02-10 MED ORDER — ATORVASTATIN CALCIUM 80 MG PO TABS: 80.0000 mg | ORAL_TABLET | Freq: Every day | ORAL | 1 refills | Status: DC

## 2021-02-10 MED ORDER — LISINOPRIL-HYDROCHLOROTHIAZIDE 20-12.5 MG PO TABS: 1.0000 | ORAL_TABLET | Freq: Every day | ORAL | 1 refills | Status: DC

## 2021-02-10 MED ORDER — LEVOTHYROXINE SODIUM 50 MCG PO TABS: 50.0000 ug | ORAL_TABLET | Freq: Every day | ORAL | 1 refills | Status: DC

## 2021-02-10 NOTE — Progress Notes (Signed)
Subjective:    Patient ID: Sergio Baker, male    DOB: Oct 21, 1934, 86 y.o.   MRN: [id number]   Chief Complaint: medical management of chronic issues     HPI:  1. Essential hypertension No c/o chest pain, sob or headache. Does not check blood pressure at home. BP Readings from Last 3 Encounters:  02/10/21 129/63  01/01/21 129/72  11/05/20 115/67     2. Permanent atrial fibrillation (HCC) No c/o heart racing or palpitations. Is on eliquis daily without any bleeding.  3. Hyperlipidemia with target LDL less than 100 Does not have a big appetite. Is not able to exercise due to unsteady gait. Lab Results  Component Value Date   CHOL 111 02/05/2021   HDL 36 (L) 02/05/2021   LDLCALC 58 02/05/2021   TRIG 86 02/05/2021   CHOLHDL 3.1 02/05/2021     4. Gastroesophageal reflux disease, unspecified whether esophagitis present Takes omeprazole daily which seems to work well for him  5. Hypothyroidism due to acquired atrophy of thyroid No problems that he is aware of.  Lab Results  Component Value Date   TSH 2.670 10/30/2020     6. Huntington's chorea ([place]) Sees neurology every 4-5 months. He is currently on haldol.  7. Abnormality of gait Unsteady gait due to Huntingson's chorea. Has not falling but wife is afraid that he will. He uses a walker or cane all the time.  8. Diverticulosis of large intestine without hemorrhage Wife says he has had no recent flare ups  9. Recurrent major depressive disorder, in full remission [facility]) He is on celexa and his wife say she is doing well. Depression screen [facility] 2/9 02/10/2021 01/01/2021 12/12/2020  Decreased Interest 0 0 0  Down, Depressed, Hopeless 0 0 0  PHQ - 2 Score 0 0 0  Altered sleeping 0 - 3  Tired, decreased energy 0 - 0  Change in appetite 0 - 0  Feeling bad or failure about yourself  0 - 0  Trouble concentrating 0 - 0  Moving slowly or fidgety/restless 0 - 0  Suicidal thoughts 0 - 0  PHQ-9 Score 0 - 3  Difficult  doing work/chores Not difficult at all - Not difficult at all  Some recent data might be hidden     10. Memory deficits Memory is maintaining. He doe snot repeat hisself  11. BMI 25.0-25.9,adult Weight is down 6lbs Wt Readings from Last 3 Encounters:  02/10/21 154 lb (69.9 kg)  01/01/21 160 lb (72.6 kg)  12/12/20 160 lb (72.6 kg)   BMI Readings from Last 3 Encounters:  02/10/21 24.86 kg/m  01/01/21 25.82 kg/m  12/12/20 25.82 kg/m       Outpatient Encounter Medications as of 02/10/2021  Medication Sig   acetaminophen (TYLENOL) 650 MG CR tablet Take 1,300 mg by mouth daily as needed for pain.   Aloe Vera 25 MG CAPS Take 25 mg by mouth daily.   atorvastatin (LIPITOR) 80 MG tablet TAKE ONE TABLET BY MOUTH AT BEDTIME   CALCIUM CITRATE PO Take 1 tablet by mouth daily.   Cholecalciferol (VITAMIN D3) 2000 UNITS capsule Take 2,000 Units by mouth daily.   citalopram (CELEXA) 20 MG tablet Take 1 tablet (20 mg total) by mouth daily.   ELIQUIS 5 MG TABS tablet TAKE ONE (1) TABLET BY MOUTH TWO (2) TIMES DAILY   Glucosamine-Chondroit-Vit C-Mn (GLUCOSAMINE CHONDR 1500 COMPLX PO) Take 1 tablet by mouth daily.   haloperidol (HALDOL) 1 MG tablet Take 1.5 tablets (  1.5 mg total) by mouth 2 (two) times daily.   levothyroxine (SYNTHROID) 50 MCG tablet Take 1 tablet (50 mcg total) by mouth daily before breakfast.   lisinopril-hydrochlorothiazide (ZESTORETIC) 20-12.5 MG tablet Take 1 tablet by mouth daily.   Methylsulfonylmethane (MSM) 1000 MG CAPS Take 1,000 mg by mouth daily.   metoprolol succinate (TOPROL-XL) 25 MG 24 hr tablet TAKE ONE (1) TABLET BY MOUTH EVERY DAY   Multiple Vitamin (MULTIVITAMIN WITH MINERALS) TABS Take 1 tablet by mouth daily. For Men 50+   mupirocin ointment (BACTROBAN) 2 %    Omega-3 Fatty Acids (FISH OIL PO) Take 1 tablet by mouth daily.   omeprazole (PRILOSEC) 20 MG capsule Take 1 capsule (20 mg total) by mouth daily.   No facility-administered encounter medications  on file as of 02/10/2021.    Past Surgical History:  Procedure Laterality Date   APPENDECTOMY     BIOPSY  07/19/2017   Procedure: BIOPSY;  Surgeon: Daneil Dolin, MD;  Location: [facility];  Service: Endoscopy;;  colon   BRAIN SURGERY     CATARACT EXTRACTION W/PHACO Left 05/07/2017   Procedure: CATARACT EXTRACTION PHACO AND INTRAOCULAR LENS PLACEMENT ([place]);  Surgeon: Baruch Goldmann, MD;  Location: [facility] ORS;  Service: Ophthalmology;  Laterality: Left;  CDE: 10.49   CATARACT EXTRACTION W/PHACO Right 06/04/2017   Procedure: CATARACT EXTRACTION PHACO AND INTRAOCULAR LENS PLACEMENT RIGHT EYE;  Surgeon: Baruch Goldmann, MD;  Location: [facility] OR[facility];  Service: Ophthalmology;  Laterality: Right;  CDE: 7.07   COLONOSCOPY  02/2010   patchy erythema, minimal granularity in distal rectum, left-sided diverticula, ileal diverticulum.  Bx benign. next TCS 02/2012   COLONOSCOPY  03/24/2012   Dr. Gala Romney- inflammatory changes of the rectum and colon consistent with history of U/C. bx= benign colonic mucosa   COLONOSCOPY N/A 10/05/2014   RMR: abnormal rectal and sigmoid mucosa as described above status post segmental biopsy. active colitis sigmoid colon.   COLONOSCOPY WITH PROPOFOL N/A 07/19/2017   Procedure: COLONOSCOPY WITH PROPOFOL;  Surgeon: Daneil Dolin, MD;  Location: [facility];  Service: Endoscopy;  Laterality: N/A;  7:30am   cyst removed from urinary bladder     ESOPHAGOGASTRODUODENOSCOPY  04/2008   schatzki ring, s/p ED, hh   LUMBAR FUSION     SHOULDER ARTHROSCOPY W/ ROTATOR CUFF REPAIR Bilateral    TONSILLECTOMY      Family History  Problem Relation Age of Onset   Lung cancer Mother 3       deceased   Other Brother        Posttraumatic stress disorder   Cancer Brother        skin cancer   Cancer Sister        thyroid   Arthritis Sister    SIDS Brother    Huntington's disease Daughter    Colon cancer Neg Hx     New complaints: None today  Social history: Lives with wife who is his  caregiver  Controlled substance contract: n/a     Review of Systems  Constitutional:  Negative for diaphoresis.  Eyes:  Negative for pain.  Respiratory:  Negative for shortness of breath.   Cardiovascular:  Negative for chest pain, palpitations and leg swelling.  Gastrointestinal:  Negative for abdominal pain.  Endocrine: Negative for polydipsia.  Skin:  Negative for rash.  Neurological:  Negative for dizziness, weakness and headaches.  Hematological:  Does not bruise/bleed easily.  All other systems reviewed and are negative.     Objective:  Physical Exam Vitals and nursing note reviewed.  Constitutional:      Appearance: Normal appearance. He is well-developed.  HENT:     Head: Normocephalic.     Nose: Nose normal.  Eyes:     Pupils: Pupils are equal, round, and reactive to light.  Neck:     Thyroid: No thyroid mass or thyromegaly.     Vascular: No carotid bruit or JVD.     Trachea: Phonation normal.  Cardiovascular:     Rate and Rhythm: Normal rate. Rhythm irregular.  Pulmonary:     Effort: Pulmonary effort is normal. No respiratory distress.     Breath sounds: Normal breath sounds.  Abdominal:     General: Bowel sounds are normal.     Palpations: Abdomen is soft.     Tenderness: There is no abdominal tenderness.  Musculoskeletal:        General: Normal range of motion.     Cervical back: Normal range of motion and neck supple.     Comments: Gait slow and unsteady- using cane to walk  Lymphadenopathy:     Cervical: No cervical adenopathy.  Skin:    General: Skin is warm and dry.  Neurological:     Mental Status: He is alert and oriented to person, place, and time.     Gait: Gait abnormal.     Comments: shaky  Psychiatric:        Behavior: Behavior normal.        Thought Content: Thought content normal.        Judgment: Judgment normal.    BP 129/63   Pulse (!) 58   Temp (!) 97.4 F (36.3 C) (Temporal)   Resp 20   Ht 5' 6"  (1.676 m)   Wt 154 lb  (69.9 kg)   SpO2 94%   BMI 24.86 kg/m        MACCOY HAUBNER comes in today with chief complaint of No chief complaint on file.   Diagnosis and orders addressed:  1. Essential hypertension Low sodium diet - lisinopril-hydrochlorothiazide (ZESTORETIC) 20-12.5 MG tablet; Take 1 tablet by mouth daily.  Dispense: 90 tablet; Refill: 1  2. Permanent atrial fibrillation (HCC) Continue eliquis  3. Hyperlipidemia with target LDL less than 100 Low fat diet  4. Gastroesophageal reflux disease, without esophagitis Avoid spicy foods Do not eat 2 hours prior to bedtime  5. Hypothyroidism due to acquired atrophy of thyroid Labs pending - levothyroxine (SYNTHROID) 50 MCG tablet; Take 1 tablet (50 mcg total) by mouth daily before breakfast.  Dispense: 90 tablet; Refill: 1  6. Huntington's chorea ([place]) Keep follow up with neuroloist  7. Abnormality of gait Fall precautions  8. Diverticulosis of large intestine without hemorrhage Watch diet to prevent flare up  9. Recurrent major depressive disorder, in full remission ([place]) Stress management - citalopram (CELEXA) 20 MG tablet; Take 1 tablet (20 mg total) by mouth daily.  Dispense: 90 tablet; Refill: 1  10. Memory deficits [place] daily  12. BMI 24.0-24.9, adult Does not need to lose anymore weight  13. Gastroesophageal reflux disease Avoid spicy foods Do not eat 2 hours prior to bedtime    Labs pending Health Maintenance reviewed Diet and exercise encouraged  Follow up plan: 3 months   Mary-Margaret Hassell Done, FNP

## 2021-02-10 NOTE — Patient Instructions (Signed)

## 2021-02-18 ENCOUNTER — Ambulatory Visit (INDEPENDENT_AMBULATORY_CARE_PROVIDER_SITE_OTHER): Payer: Medicare Other | Admitting: *Deleted

## 2021-02-18 DIAGNOSIS — I4821 Permanent atrial fibrillation: Secondary | ICD-10-CM

## 2021-02-18 DIAGNOSIS — I6930 Unspecified sequelae of cerebral infarction: Secondary | ICD-10-CM

## 2021-02-18 DIAGNOSIS — G10 Huntington's disease: Secondary | ICD-10-CM

## 2021-02-18 NOTE — Chronic Care Management (AMB) (Signed)
Chronic Care Management   CCM RN Visit Note  02/18/2021 Name: Sergio Baker MRN: [id number] DOB: 07-May-1935  Subjective: Sergio Baker is a 86 y.o. year old male who is a primary care patient of Chevis Pretty, FNP. The care management team was consulted for assistance with disease management and care coordination needs.    Engaged with patient's wife, Sergio Baker, by telephone  for follow up visit in response to provider referral for case management and/or care coordination services.   Consent to Services:  The patient was given information about Chronic Care Management services, agreed to services, and gave verbal consent prior to initiation of services.  Please see initial visit note for detailed documentation.   Patient agreed to services and verbal consent obtained.   Assessment: Review of patient past medical history, allergies, medications, health status, including review of consultants reports, laboratory and other test data, was performed as part of comprehensive evaluation and provision of chronic care management services.   SDOH (Social Determinants of Health) assessments and interventions performed:    CCM Care Plan  No Known Allergies  Outpatient Encounter Medications as of 02/18/2021  Medication Sig   acetaminophen (TYLENOL) 650 MG CR tablet Take 1,300 mg by mouth daily as needed for pain.   Aloe Vera 25 MG CAPS Take 25 mg by mouth daily.   atorvastatin (LIPITOR) 80 MG tablet Take 1 tablet (80 mg total) by mouth at bedtime.   CALCIUM CITRATE PO Take 1 tablet by mouth daily.   Cholecalciferol (VITAMIN D3) 2000 UNITS capsule Take 2,000 Units by mouth daily.   citalopram (CELEXA) 20 MG tablet Take 1 tablet (20 mg total) by mouth daily.   ELIQUIS 5 MG TABS tablet TAKE ONE (1) TABLET BY MOUTH TWO (2) TIMES DAILY   Glucosamine-Chondroit-Vit C-Mn (GLUCOSAMINE CHONDR 1500 COMPLX PO) Take 1 tablet by mouth daily.   haloperidol (HALDOL) 1 MG tablet Take 1.5 tablets (1.5 mg  total) by mouth 2 (two) times daily.   levothyroxine (SYNTHROID) 50 MCG tablet Take 1 tablet (50 mcg total) by mouth daily before breakfast.   lisinopril-hydrochlorothiazide (ZESTORETIC) 20-12.5 MG tablet Take 1 tablet by mouth daily.   Methylsulfonylmethane (MSM) 1000 MG CAPS Take 1,000 mg by mouth daily.   metoprolol succinate (TOPROL-XL) 25 MG 24 hr tablet Take 1 tablet (25 mg total) by mouth daily.   Multiple Vitamin (MULTIVITAMIN WITH MINERALS) TABS Take 1 tablet by mouth daily. For Men 50+   mupirocin ointment (BACTROBAN) 2 %    Omega-3 Fatty Acids (FISH OIL PO) Take 1 tablet by mouth daily.   omeprazole (PRILOSEC) 20 MG capsule Take 1 capsule (20 mg total) by mouth daily.   No facility-administered encounter medications on file as of 02/18/2021.    Patient Active Problem List   Diagnosis Date Noted   BMI 24.0-24.9, adult 02/10/2021   Essential hypertension 12/01/2019   Irregular heart beat 12/01/2019   Atrial fibrillation ([place]) 12/01/2019   Gastroesophageal reflux disease 09/01/2019   BPH (benign prostatic hyperplasia) 12/23/2015   BMI 27.0-27.9,adult 09/06/2015   Hypothyroidism 01/18/2014   Hyperlipidemia with target LDL less than 100 01/18/2014   Depression 01/18/2014   Memory deficits 01/17/2014   Abnormality of gait 05/23/2012   Huntington's chorea ([place]) 05/23/2012   Diverticulosis of large intestine 03/08/2009    Conditions to be addressed/monitored:Atrial Fibrillation and Huntington's, hx of CVA with residual effects  Care Plan : RNCM: Stroke (Adult)  Updates made by Ilean China, RN since 02/18/2021 12:00 AM  Problem: Residual Deficits (Stroke)   Priority: Medium     Long-Range Goal: Residual Deficits Prevented or Minimized   Start Date: 01/06/2021  This Visit's Progress: On track  Recent Progress: On track  Priority: Medium  Note:   Current Barriers:  Knowledge Deficits related to prescription assistance for Eliquis Chronic Disease Management support  and education needs related to ischemic stroke due to Afib Unable to independently drive  Nurse Case Manager Clinical Goal(s):  patient will meet with RN Care Manager to address needs related to effects of CVA the patient will demonstrate ongoing self health care management ability as evidenced by taking medications as prescribed and by keeping all medical appointments*  Interventions:  1:1 collaboration with Chevis Pretty, FNP regarding development and update of comprehensive plan of care as evidenced by provider attestation and co-signature Inter-disciplinary care team collaboration (see longitudinal plan of care) Chart reviewed including relevant office notes, correspondence notes, lab results, and imaging reports Evaluation of current treatment plan related to late effects of CVA and patient's adherence to plan as established by provider. Evaluated for changes in function since last telephone visit Assessed impact on wife and caregiver strain Discussed family/social support Reviewed upcoming appointments Discussed physical and psychological impact of CVA Discussed fatigue and importance of getting enough rest Has fatigue due to Huntington's anyway but has had increased fatigue and sleepiness s/p CVA Discussed decreased appetite and weight loss Has lost about 6lbs since having CVA Had a good appetite prior to CVA but now has very little interest in eating but will eat if strongly encouraged by wife Encouraged to provide foods that he likes to eat and to encourage intake of healthy fats, protein, and complex carbs  Therapeutic listening utilized regarding wife's concerns Discussed ambulance and hospital bill. Has not received helicopter bill yet. Advised to call the billing department with any questions and to request an itemized statement for review.   Provided with RN Care Manager contact information and encouraged to reach out as needed Discussed plans with patient for ongoing  care management follow up and provided patient with direct contact information for care management team  Self Care Activities:  Self administers medications as prescribed Attends all scheduled provider appointments  Patient Goals Over the next 90 days, patient will: Keep all medical appointments Take all medications as prescribed Use walker for all ambulation Move carefully and change positions slowly to minimize fall risks Increase calorie intake. Eat foods that you like that include healthy fats, complex carbs, and proteins.  Maintain or gain weight Increase physical activity level as tolerated but rest as needed Call RN Care Manager if needed [phone] Call 911 for any stroke symptoms  Follow Up Plan:  Telephone follow up appointment with care management team member scheduled for:  02/27/21 with RNCM The patient has been provided with contact information for the care management team and has been advised to call with any health related questions or concerns.      Plan:Telephone follow up appointment with care management team member scheduled for:  02/27/21 with RNCM  Chong Sicilian, BSN, RN-BC [place] [facility] [place] [facility] Direct Dial: [phone]

## 2021-02-18 NOTE — Patient Instructions (Signed)
Visit Information  PATIENT GOALS:  Goals Addressed             This Visit's Progress    Improve Strength and Minimize Residual Deficits   On track    Timeframe:  Long-Range Goal Priority:  Medium Start Date:   01/06/21                          Expected End Date:  08/16/21                      Follow Up Date 02/27/21   Keep all medical appointments Take all medications as prescribed Use walker for all ambulation Move carefully and change positions slowly to minimize fall risks Increase calorie intake. Eat foods that you like that include healthy fats, complex carbs, and proteins.  Maintain or gain weight Increase physical activity level as tolerated but rest as needed Call [place] if needed [phone] Call 911 for any stroke symptoms   Why is this important?   Before the stroke you probably did not think much about being safe when you are up and about.  Now, it may be harder for you to get around.  It may also be easier for you to trip or fall.  It is common to have muscle weakness after a stroke. You may also feel like you cannot control an arm or leg.  It will be helpful to work with a physical therapist to get your strength and muscle control back.  It is good to stay as active as you can. Walking and stretching help you stay strong and flexible.  The physical therapist will develop an exercise program just for you.     Notes:          Patient verbalizes understanding of instructions provided today and agrees to view in [facility].   Telephone follow up appointment with care management team member scheduled for: 02/27/21 with RNCM  Chong Sicilian, BSN, RN-BC [place] [facility] [place] Management Direct Dial: [phone]

## 2021-02-19 DIAGNOSIS — C44529 Squamous cell carcinoma of skin of other part of trunk: Secondary | ICD-10-CM | POA: Diagnosis not present

## 2021-02-19 DIAGNOSIS — L988 Other specified disorders of the skin and subcutaneous tissue: Secondary | ICD-10-CM | POA: Diagnosis not present

## 2021-02-26 DIAGNOSIS — Z4802 Encounter for removal of sutures: Secondary | ICD-10-CM | POA: Diagnosis not present

## 2021-02-27 ENCOUNTER — Ambulatory Visit: Payer: Medicare Other | Admitting: *Deleted

## 2021-02-27 DIAGNOSIS — I4821 Permanent atrial fibrillation: Secondary | ICD-10-CM

## 2021-02-27 DIAGNOSIS — I6930 Unspecified sequelae of cerebral infarction: Secondary | ICD-10-CM

## 2021-02-27 DIAGNOSIS — F3342 Major depressive disorder, recurrent, in full remission: Secondary | ICD-10-CM

## 2021-02-27 DIAGNOSIS — G10 Huntington's disease: Secondary | ICD-10-CM

## 2021-02-27 NOTE — Chronic Care Management (AMB) (Signed)
Chronic Care Management   CCM RN Visit Note  02/27/2021 Name: Sergio Baker MRN: [id number] DOB: June 14, 1935  Subjective: Sergio Baker is a 86 y.o. year old male who is a primary care patient of Chevis Pretty, FNP. The care management team was consulted for assistance with disease management and care coordination needs.    Engaged with patient by telephone for follow up visit in response to provider referral for case management and/or care coordination services.   Consent to Services:  Prior consent given > 1 year ago. The patient was given the following information about Chronic Care Management services today, agreed to services, and gave verbal consent: 1. CCM service includes personalized support from designated clinical staff supervised by the primary care provider, including individualized plan of care and coordination with other care providers 2. 24/7 contact phone numbers for assistance for urgent and routine care needs. 3. Service will only be billed when office clinical staff spend 20 minutes or more in a month to coordinate care. 4. Only one practitioner may furnish and bill the service in a calendar month. 5.The patient may stop CCM services at any time (effective at the end of the month) by phone call to the office staff. 6. The patient will be responsible for cost sharing (co-pay) of up to 20% of the service fee (after annual deductible is met). Patient agreed to services and consent obtained.  Patient agreed to services and verbal consent obtained.   Assessment: Review of patient past medical history, allergies, medications, health status, including review of consultants reports, laboratory and other test data, was performed as part of comprehensive evaluation and provision of chronic care management services.   SDOH (Social Determinants of Health) assessments and interventions performed:    CCM Care Plan  No Known Allergies  Outpatient Encounter Medications as of  02/27/2021  Medication Sig   acetaminophen (TYLENOL) 650 MG CR tablet Take 1,300 mg by mouth daily as needed for pain.   Aloe Vera 25 MG CAPS Take 25 mg by mouth daily.   atorvastatin (LIPITOR) 80 MG tablet Take 1 tablet (80 mg total) by mouth at bedtime.   CALCIUM CITRATE PO Take 1 tablet by mouth daily.   Cholecalciferol (VITAMIN D3) 2000 UNITS capsule Take 2,000 Units by mouth daily.   citalopram (CELEXA) 20 MG tablet Take 1 tablet (20 mg total) by mouth daily.   ELIQUIS 5 MG TABS tablet TAKE ONE (1) TABLET BY MOUTH TWO (2) TIMES DAILY   Glucosamine-Chondroit-Vit C-Mn (GLUCOSAMINE CHONDR 1500 COMPLX PO) Take 1 tablet by mouth daily.   haloperidol (HALDOL) 1 MG tablet Take 1.5 tablets (1.5 mg total) by mouth 2 (two) times daily.   levothyroxine (SYNTHROID) 50 MCG tablet Take 1 tablet (50 mcg total) by mouth daily before breakfast.   lisinopril-hydrochlorothiazide (ZESTORETIC) 20-12.5 MG tablet Take 1 tablet by mouth daily.   Methylsulfonylmethane (MSM) 1000 MG CAPS Take 1,000 mg by mouth daily.   metoprolol succinate (TOPROL-XL) 25 MG 24 hr tablet Take 1 tablet (25 mg total) by mouth daily.   Multiple Vitamin (MULTIVITAMIN WITH MINERALS) TABS Take 1 tablet by mouth daily. For Men 50+   mupirocin ointment (BACTROBAN) 2 %    Omega-3 Fatty Acids (FISH OIL PO) Take 1 tablet by mouth daily.   omeprazole (PRILOSEC) 20 MG capsule Take 1 capsule (20 mg total) by mouth daily.   No facility-administered encounter medications on file as of 02/27/2021.    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   Diverticulosis of large intestine 03/08/2009    Conditions to be addressed/monitored:Atrial Fibrillation, Depression, and Huntington's Disease  Care Plan : [facility] Care Plan  Updates made by Ilean China, RN since 02/27/2021 12:00 AM     Problem: Chronic Disease Management Needs (Huntington's Disease, Afib, Depression)   Priority: Medium     Long-Range Goal: Work with [facility] Regarding Care Coordination and Chronic Disease Management Needs   Start Date: 01/06/2021  This Visit's Progress: On track  Recent Progress: On track  Priority: Medium  Note:   Current Barriers:  Chronic Disease Management support and education needs related to Atrial Fibrillation, HTN, Depression, and Huntington's, hypothyroidism, latent effects of CVA, and memory deficits Knowledge Deficits related to prescription assistance for Eliquis Chronic Disease Management support and education needs related to ischemic stroke due to Afib Unable to independently drive  RNCM Clinical Goal(s):  Patient will meet with RN Care Team to address chronic care management needs and care coordination needs related to HTN, Depression, Huntington's, hypothyroidism, Afib, memory deficits, and latent effects of CVA the patient will demonstrate ongoing self health care management ability as evidenced by taking medications as prescribed and by keeping all medical appointments  Interventions: 1:1 collaboration with Chevis Pretty, FNP regarding development and update of comprehensive plan of care as evidenced by provider attestation and co-signature Inter-disciplinary care team collaboration (see longitudinal plan of care) Chart reviewed including relevant office notes, lab results, imaging reports, correspondence reports, and hospital notes Discussed plans with patient/wife for ongoing care management follow up and provided patient with  direct contact information for care management team   A-fib:  (Status: Goal on track: YES.) Counseled on increased risk of stroke due to Afib and benefits of anticoagulation for stroke prevention; Reviewed importance of adherence to anticoagulant exactly as prescribed; Counseled on avoidance of NSAIDs due to increased bleeding risk with anticoagulants; Counseled on seeking medical attention after a head injury or if there is blood in the urine/stool; Discussed recent CVA and residual effects. Patient has Huntington's disease and some of the symptoms overlap. He does have an increase in fatigue and decreased appetite with weight loss of about 6 lbs.  Previously talked with wife about mobility and ability to perform ADLs as well as social/family support Previously talked with wife about helicopter and ambulance bills Reviewed upcoming appointments  Huntington's  (Status: Goal on track: YES.) Evaluated for changes in function since last telephone visit. Patient reports that he feels about the same. No real changes.  RNCM will plan to call and talk with wife again over the next 2 weeks per patient's request.  Depression  (Status: Goal on track: YES.) Reviewed medications and importance of compliance Previously talked with wife about incressed sleeping and depression since CVA from baseline Previously discussed family/social support Encouraged to talk with LCSW regarding psychosocial issues  Patient Goals/Self-Care Activities: Patient will self administer medications as prescribed Patient will attend all scheduled provider appointments Use walker for all ambulation Move carefully and change positions slowly to minimize fall risks Increase calorie intake. Eat foods  that you like that include healthy fats, complex carbs, and proteins.  Maintain or gain weight Increase physical activity level as tolerated but rest as needed Call RN Care Manager if needed [phone] Call 911 for any stroke  symptoms Talk with LCSW regarding psychosocial issues  Follow Up Plan:  Telephone follow up appointment with care management team member scheduled for: 03/13/21 with [facility] The patient has been provided with contact information for the care management team and has been advised to call with any health related questions or concerns.       Plan:Telephone follow up appointment with care management team member scheduled for:  03/13/21 with RNCM  Chong Sicilian, BSN, RN-BC [place] / [place] Management Direct Dial: [phone]

## 2021-02-27 NOTE — Patient Instructions (Signed)
Visit Information  PATIENT GOALS:  Goals Addressed             This Visit's Progress    Improve Strength and Minimize Residual Deficits (s/p CVA)   On track    Timeframe:  Long-Range Goal Priority:  Medium Start Date:   01/06/21                          Expected End Date:  08/16/21                      Follow Up Date 03/13/21   Keep all medical appointments Take all medications as prescribed Use walker for all ambulation Move carefully and change positions slowly to minimize fall risks Increase calorie intake. Eat foods that you like that include healthy fats, complex carbs, and proteins.  Maintain or gain weight Increase physical activity level as tolerated but rest as needed Call [place] if needed [phone] Call 911 for any stroke symptoms   Why is this important?   Before the stroke you probably did not think much about being safe when you are up and about.  Now, it may be harder for you to get around.  It may also be easier for you to trip or fall.  It is common to have muscle weakness after a stroke. You may also feel like you cannot control an arm or leg.  It will be helpful to work with a physical therapist to get your strength and muscle control back.  It is good to stay as active as you can. Walking and stretching help you stay strong and flexible.  The physical therapist will develop an exercise program just for you.     Notes:      Quality of Life Maintained (Huntington's Disease/Depression)   On track    Timeframe:  Long-Range Goal Priority:  Medium Start Date: 02/27/21                            Expected End Date:     02/27/22                  Follow-up: 03/13/21  Take medication as prescribed Call PCP with any new or worsening symptoms Keep all medical appts Increase physical activity as tolerated but rest as needed Stay socially active Eat 3 meals a day. Add in supplements if needed. Try to get outside daily Use walker for balance when  walking Call [place] as needed [phone]         Patient verbalizes understanding of instructions provided today and agrees to view in [place].   Telephone follow up appointment with care management team member scheduled for: 03/13/21 with RNCM  Chong Sicilian, BSN, RN-BC [place] / [place] Management Direct Dial: [phone]

## 2021-03-10 NOTE — Progress Notes (Signed)
Cardiology Office Note  Date: 03/11/2021   ID: SANTINO KINSELLA, DOB [birth year], MRN [id number]  PCP:  Chevis Pretty, FNP  Cardiologist:  Rozann Lesches, MD Electrophysiologist:  None   Chief Complaint: 1 year follow up  History of Present Illness: Sergio Baker is a 86 y.o. male with a history of HTN, Atrial fibrillation, GERD, hypothyroidism, Huntington's disease, BPH, HLD.   He was last seen by Dr. Domenic Polite on 12/26/2019 in consultation for atrial fibrillation on EKG in April 2021. His CHA2DS2-VASc was 2. Risk of stroke associated with atrial fibrillation was discussed as well as bleeding risk on anticoagulation versus aspirin. He planned to continue with ASA. His heart rate was controlled. He did not require AV nodal blockers for control. Obtaining an echocardiogram was discussed. He was continuing Zestoretic for HTN.   He presented to [facility] ED on 12/22/2020 with complaint of left-sided weakness, slurred speech and left-sided facial droop.  He received tPA.  He was accepted at [facility] and transferred to neuro ICU for CVA.  He presents today for 1 year follow-up.  He was apparently started on Eliquis after CVA discovered and CVA was attributable to atrial fibrillation.  He has some mild residual left-sided weakness.  He is using a walker to ambulate.  He has a history of Huntington's chorea with choreiform movements controlled by Haldol.  He denies any CVA or TIA-like symptoms now.  He denies any palpitations or arrhythmias, orthostatic symptoms, shortness of breath or DOE.  Denies any bleeding since starting Eliquis.  No sensation of palpitations or arrhythmias.  EKG today shows atrial fibrillation with slow ventricular response rate of 57.  Current cardiac regimen includes;, atorvastatin 80 mg daily, Eliquis 5 mg p.o. twice daily, Zestoretic 20/12.5 mg p.o. daily, Toprol-XL 25 mg daily.    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[place])    Diagnosed in the 1980s   Schatzki's ring    Stroke (cerebrum) ([place])    Ulcerative colitis ([place])     Past Surgical History:  Procedure Laterality Date   APPENDECTOMY     BIOPSY  07/19/2017   Procedure: BIOPSY;  Surgeon: Daneil Dolin, MD;  Location: [facility] ENDO SUITE;  Service: Endoscopy;;  colon   BRAIN SURGERY     CATARACT EXTRACTION W/PHACO Left 05/07/2017   Procedure: CATARACT EXTRACTION PHACO AND INTRAOCULAR LENS PLACEMENT ([place]);  Surgeon: Baruch Goldmann, MD;  Location: [facility] ORS;  Service: Ophthalmology;  Laterality: Left;  CDE: 10.49   CATARACT EXTRACTION W/PHACO Right 06/04/2017   Procedure: CATARACT EXTRACTION PHACO AND INTRAOCULAR LENS PLACEMENT RIGHT EYE;  Surgeon: Baruch Goldmann, MD;  Location: [facility] ORS;  Service: Ophthalmology;  Laterality: Right;  CDE: 7.07   COLONOSCOPY  02/2010   patchy erythema, minimal granularity in distal rectum, left-sided diverticula, ileal diverticulum.  Bx benign. next TCS 02/2012   COLONOSCOPY  03/24/2012   Dr. Gala Romney- inflammatory changes of the rectum and colon consistent with history of U/C. bx= benign colonic mucosa   COLONOSCOPY N/A 10/05/2014   RMR: abnormal rectal and sigmoid  mucosa as described above status post segmental biopsy. active colitis sigmoid colon.   COLONOSCOPY WITH PROPOFOL N/A 07/19/2017   Procedure: COLONOSCOPY WITH PROPOFOL;  Surgeon: Daneil Dolin, MD;  Location: [facility] ENDO SU[facility];  Service: Endoscopy;  Laterality: N/A;  7:30am   cyst removed from urinary bladder     ESOPHAGOGASTRODUODENOSCOPY  04/2008   schatzki ring, s/p ED, hh   LUMBAR FUSION     SHOULDER ARTHROSCOPY W/ ROTATOR CUFF REPAIR Bilateral     TONSILLECTOMY      Current Outpatient Medications  Medication Sig Dispense Refill   acetaminophen (TYLENOL) 650 MG CR tablet Take 1,300 mg by mouth daily as needed for pain.     Aloe Vera 25 MG CAPS Take 25 mg by mouth daily.     atorvastatin (LIPITOR) 80 MG tablet Take 1 tablet (80 mg total) by mouth at bedtime. 90 tablet 1   CALCIUM CITRATE PO Take 1 tablet by mouth daily.     Cholecalciferol (VITAMIN D3) 2000 UNITS capsule Take 2,000 Units by mouth daily.     citalopram (CELEXA) 20 MG tablet Take 1 tablet (20 mg total) by mouth daily. 90 tablet 1   ELIQUIS 5 MG TABS tablet TAKE ONE (1) TABLET BY MOUTH TWO (2) TIMES DAILY 60 tablet 2   Glucosamine-Chondroit-Vit C-Mn (GLUCOSAMINE CHONDR 1500 COMPLX PO) Take 1 tablet by mouth daily.     haloperidol (HALDOL) 1 MG tablet Take 1.5 tablets (1.5 mg total) by mouth 2 (two) times daily. 270 tablet 1   levothyroxine (SYNTHROID) 50 MCG tablet Take 1 tablet (50 mcg total) by mouth daily before breakfast. 90 tablet 1   lisinopril-hydrochlorothiazide (ZESTORETIC) 20-12.5 MG tablet Take 1 tablet by mouth daily. 90 tablet 1   Methylsulfonylmethane (MSM) 1000 MG CAPS Take 1,000 mg by mouth daily.     metoprolol succinate (TOPROL-XL) 25 MG 24 hr tablet Take 1 tablet (25 mg total) by mouth daily. 90 tablet 1   Multiple Vitamin (MULTIVITAMIN WITH MINERALS) TABS Take 1 tablet by mouth daily. For Men 50+     mupirocin ointment (BACTROBAN) 2 %      Omega-3 Fatty Acids (FISH OIL PO) Take 1 tablet by mouth daily.     omeprazole (PRILOSEC) 20 MG capsule Take 1 capsule (20 mg total) by mouth daily. 90 capsule 1   No current facility-administered medications for this visit.   Allergies:  Patient has no known allergies.   Social History: The patient  reports that he quit smoking about 47 years ago. His smoking use included cigarettes. He has a 2.00 pack-year smoking history. He has never used smokeless tobacco. He reports that he does not drink alcohol and does not  use drugs.   Family History: The patient's family history includes Arthritis in his sister; Cancer in his brother and sister; Huntington's disease in his daughter; Lung cancer (age of onset: 80) in his mother; Other in his brother; SIDS in his brother.   ROS:  Please see the history of present illness. Otherwise, complete review of systems is positive for none.  All other systems are reviewed and negative.   Physical Exam: VS:  BP 130/64   Pulse 66   Ht 5' 6"  (1.676 m)   Wt 154 lb (69.9 kg)   SpO2 96%   BMI 24.86 kg/m , BMI Body mass index is 24.86 kg/m.  Wt Readings from Last 3 Encounters:  03/11/21 154 lb (69.9 kg)  02/10/21 154 lb (69.9 kg)  01/01/21 160 lb (72.6  kg)    General: Patient appears comfortable at rest. Neck: Supple, no elevated JVP or carotid bruits, no thyromegaly. Lungs: Clear to auscultation, nonlabored breathing at rest. Cardiac: Regular rate and rhythm, no S3 or significant systolic murmur, no pericardial rub. Extremities: No pitting edema, distal pulses 2+. Skin: Warm and dry. Musculoskeletal: No kyphosis. Neuropsychiatric: Alert and oriented x3, affect grossly appropriate.  ECG: 03/11/2021 EKG atrial fibrillation with slow ventricular response rate of 57.   Recent Labwork: 10/30/2020: TSH 2.670 02/05/2021: ALT 19; AST 26; BUN 16; Creatinine, Ser 1.00; Hemoglobin 15.1; Platelets 231; Potassium 3.6; Sodium 141     Component Value Date/Time   CHOL 111 02/05/2021 0955   CHOL 143 12/26/2012 0907   TRIG 86 02/05/2021 0955   TRIG 122 10/25/2014 1056   TRIG 170 (H) 12/26/2012 0907   HDL 36 (L) 02/05/2021 0955   HDL 37 (L) 10/25/2014 1056   HDL 40 12/26/2012 0907   CHOLHDL 3.1 02/05/2021 0955   LDLCALC 58 02/05/2021 0955   LDLCALC 109 (H) 01/18/2014 1027   LDLCALC 69 12/26/2012 0907    Other Studies Reviewed Today:   MRI of the head 12/23/2020 [facility] IMPRESSION:  Findings are compatible with small to moderate size fairly  acute infarct involving right insular cortex and adjacent frontal lobe. There is mild edema but no mass effect or shift.    There is an area of diffusion restriction involving the right insular cortex and adjacent inferior right frontal lobe. This is consistent with acute infarct. There is mild cortical edema but no mass effect or shift.  There is an old lacunar infarct in the left periventricular region.  There is mild cerebral atrophy. Ventricular size is appropriate for brain volume.  There is no acute hemorrhage seen.    CT of the head 12/23/2020 [facility]  IMPRESSION:  1.  Small area of blurring of the gray-white matter differentiation in the region of the right insula and operculum compatible with acute to subacute infarction. No acute hemorrhage.      2D echocardiogram 12/24/2020 [facility] Impression  Left Atrium: Injection of agitated saline documents no interatrial  shunt.    Left Ventricle: EF: 60-65%.  Narrative  This result has an attachment that is not available.    Left Ventricle  Left ventricle size is normal. There is mild hypertrophy. EF: 60-65%. Wall motion is normal. Doppler parameters are indeterminate for diastolic function.   Right Ventricle  Right ventricle is normal. Systolic function is normal.   Left Atrium  Left atrium is mildly dilated. Injection of agitated saline documents no interatrial shunt.   Right Atrium  Right atrium is mildly dilated.   Mitral Valve  Mitral valve structure is normal. There is mild annular calcification. There is no mitral regurgitation.   Tricuspid Valve  Tricuspid valve structure is normal. There is mild regurgitation. The right ventricular systolic pressure is normal (<36 mmHg).   Aortic Valve  The aortic valve is tricuspid. The leaflets exhibit normal excursion. There is mild sclerosis. Trace aortic valve regurgitation. There is no evidence of aortic valve stenosis.    Pulmonic Valve  The pulmonic valve was not well visualized. Trace regurgitation.   Ascending Aorta  The aortic root is normal in size.   Pericardium  There is no pericardial effusion.   Study Details  A complete echo was performed using complete 2D, color flow Doppler and spectral Doppler. Saline (bubble) contrast was injected during  the study.Overall the study quality was adequate. The study was technically difficult.     Echocardiogram 12/27/2019   1. Left ventricular ejection fraction, by estimation, is 60 to 65%. The left ventricle has normal function. The left ventricle has no regional wall motion abnormalities. There is mild left ventricular hypertrophy. Left ventricular diastolic parameters are indeterminate. 2. Right ventricular systolic function is normal. The right ventricular size is moderately enlarged. There is mildly elevated pulmonary artery systolic pressure. The estimated right ventricular systolic pressure is 76.5 mmHg. 3. Left atrial size was upper normal. 4. Right atrial size was severely dilated. 5. The mitral valve is grossly normal, mildly thickened. Mild mitral valve regurgitation. 6. Tricuspid valve regurgitation is mild to moderate. 7. The aortic valve is tricuspid. Aortic valve regurgitation is mild. Mild aortic valve sclerosis is present, with no evidence of aortic valve stenosis. 8. The inferior vena cava is normal in size with <50% respiratory variability, suggesting right atrial pressure of 8 mmHg. 9. Evidence of atrial level shunting detected by color flow Doppler. There is a small secundum atrial septal defect with predominantly left to right shunting across the atrial septum.  Assessment and Plan:  1. Permanent atrial fibrillation ([place])   2. Essential hypertension   3. Cerebrovascular accident (CVA), unspecified mechanism ([place])    1. Permanent atrial fibrillation (HCC) Atrial fibrillation rate is controlled today.  EKG shows atrial  fibrillation with slow ventricular response with a rate of 57.  Continue Eliquis 5 mg p.o. twice daily.  Continue Toprol-XL 25 mg p.o. daily.  No complaints of bleeding.  2. Essential hypertension BP today 130/64.  Continue Zestoretic 20/12.5 mg p.o. daily.  Continue Toprol-XL 25 mg p.o. daily  3.  CVA. Patient recently had CVA secondary to thrombus from atrial fibrillation.  He presented to [facility] 12/22/2020 and was eventually transferred to [facility].  He received tPA.  Still has some left residual weakness.  Patient is using a walker today.  Continue Eliquis 5 mg p.o. twice daily.  4.  Hyperlipidemia Continue atorvastatin 80 mg p.o. daily.  Recent lipid panel on 02/05/2021: TC 111, TG 86, HDL 36, LDL 58  Medication Adjustments/Labs and Tests Ordered: Current medicines are reviewed at length with the patient today.  Concerns regarding medicines are outlined above.   Disposition: Follow-up with Dr. Domenic Polite or APP 1 year  Signed, Levell July, NP 03/11/2021 4:46 PM    [facility] at [address], [place], [place] [address] Phone: [phone]; Fax: [phone]

## 2021-03-11 ENCOUNTER — Other Ambulatory Visit: Payer: Self-pay

## 2021-03-11 ENCOUNTER — Ambulatory Visit: Payer: Medicare Other | Admitting: Family Medicine

## 2021-03-11 ENCOUNTER — Encounter: Payer: Self-pay | Admitting: Family Medicine

## 2021-03-11 VITALS — BP 130/64 | HR 66 | Ht 66.0 in | Wt 154.0 lb

## 2021-03-11 DIAGNOSIS — I639 Cerebral infarction, unspecified: Secondary | ICD-10-CM | POA: Diagnosis not present

## 2021-03-11 DIAGNOSIS — I4821 Permanent atrial fibrillation: Secondary | ICD-10-CM | POA: Diagnosis not present

## 2021-03-11 DIAGNOSIS — I10 Essential (primary) hypertension: Secondary | ICD-10-CM

## 2021-03-11 NOTE — Patient Instructions (Signed)
Medication Instructions:  Your physician recommends that you continue on your current medications as directed. Please refer to the Current Medication list given to you today.  *If you need a refill on your cardiac medications before your next appointment, please call your pharmacy*   Lab Work: NONE   If you have labs (blood work) drawn today and your tests are completely normal, you will receive your results only by: [place] (if you have MyChart) OR A paper copy in the mail If you have any lab test that is abnormal or we need to change your treatment, we will call you to review the results.   Testing/Procedures: NONE    Follow-Up: At [facility], you and your health needs are our priority.  As part of our continuing mission to provide you with exceptional heart care, we have created designated Provider Care Teams.  These Care Teams include your primary Cardiologist (physician) and Advanced Practice Providers (APPs -  Physician Assistants and Nurse Practitioners) who all work together to provide you with the care you need, when you need it.  We recommend signing up for the patient portal called "MyChart".  Sign up information is provided on this After Visit Summary.  MyChart is used to connect with patients for Virtual Visits (Telemedicine).  Patients are able to view lab/test results, encounter notes, upcoming appointments, etc.  Non-urgent messages can be sent to your provider as well.   To learn more about what you can do with MyChart, go to [email or web address].    Your next appointment:   1 year(s)  The format for your next appointment:   In Person  Provider:   Rozann Lesches, MD   Other Instructions Thank you for choosing [facility]!

## 2021-03-13 ENCOUNTER — Telehealth: Payer: Medicare Other | Admitting: *Deleted

## 2021-03-13 NOTE — Addendum Note (Signed)
Addended by: Merlene Laughter on: 03/13/2021 09:51 AM   Modules accepted: Orders

## 2021-03-22 ENCOUNTER — Other Ambulatory Visit: Payer: Self-pay | Admitting: Nurse Practitioner

## 2021-03-27 ENCOUNTER — Encounter: Payer: Self-pay | Admitting: Nurse Practitioner

## 2021-03-27 ENCOUNTER — Ambulatory Visit (INDEPENDENT_AMBULATORY_CARE_PROVIDER_SITE_OTHER): Payer: Medicare Other | Admitting: Nurse Practitioner

## 2021-03-27 ENCOUNTER — Ambulatory Visit (INDEPENDENT_AMBULATORY_CARE_PROVIDER_SITE_OTHER): Payer: Medicare Other

## 2021-03-27 ENCOUNTER — Other Ambulatory Visit: Payer: Self-pay

## 2021-03-27 VITALS — BP 124/70 | HR 62 | Temp 97.5°F | Resp 20 | Ht 66.0 in | Wt 154.0 lb

## 2021-03-27 DIAGNOSIS — M79671 Pain in right foot: Secondary | ICD-10-CM | POA: Diagnosis not present

## 2021-03-27 NOTE — Patient Instructions (Signed)
Foot Pain Many things can cause foot pain. Some common causes are: An injury. A sprain. Arthritis. Blisters. Bunions. Follow these instructions at home: Managing pain, stiffness, and swelling If directed, put ice on the painful area: Put ice in a plastic bag. Place a towel between your skin and the bag. Leave the ice on for 20 minutes, 2-3 times a day.  Activity Do not stand or walk for long periods. Return to your normal activities as told by your health care provider. Ask your health care provider what activities are safe for you. Do stretches to relieve foot pain and stiffness as told by your health care provider. Do not lift anything that is heavier than 10 lb (4.5 kg), or the limit that you are told, until your health care provider says that it is safe. Lifting a lot of weight can put added pressure on your feet. Lifestyle Wear comfortable, supportive shoes that fit you well. Do not wear high heels. Keep your feet clean and dry. General instructions Take over-the-counter and prescription medicines only as told by your health care provider. Rub your foot gently. Pay attention to any changes in your symptoms. Keep all follow-up visits as told by your health care provider. This is important. Contact a health care provider if: Your pain does not get better after a few days of self-care. Your pain gets worse. You cannot stand on your foot. Get help right away if: Your foot is numb or tingling. Your foot or toes are swollen. Your foot or toes turn white or blue. You have warmth and redness along your foot. Summary Common causes of foot pain are injury, sprain, arthritis, blisters or bunions. Ice, medicines, and comfortable shoes may help foot pain. Contact your health care provider if your pain does not get better after a few days of self-care. This information is not intended to replace advice given to you by your health care provider. Make sure you discuss any questions you  have with your healthcare provider. Document Revised: 05/19/2018 Document Reviewed: 05/19/2018 Elsevier Patient Education  [place].

## 2021-03-27 NOTE — Progress Notes (Signed)
   Subjective:    Patient ID: Sergio Baker, male    DOB: [birth year], 86 y.o.   MRN: [id number]  Chief Complaint: Foot Pain (Right/)   HPI Patient woke up Tuesday morning with right foot pain. Was hurting so bad he was crying. Painful to walk on. Denies any injury.     Review of Systems  Musculoskeletal:  Positive for joint swelling (right foot).  All other systems reviewed and are negative.     Objective:   Physical Exam Vitals and nursing note reviewed.  Constitutional:      Appearance: Normal appearance.  Cardiovascular:     Rate and Rhythm: Normal rate and regular rhythm.     Heart sounds: Normal heart sounds.  Musculoskeletal:     Comments: FROM of right foot with pain on full flexion No edema or erythema  Skin:    General: Skin is warm.  Neurological:     General: No focal deficit present.     Mental Status: He is alert and oriented to person, place, and time.  Psychiatric:        Mood and Affect: Mood normal.        Behavior: Behavior normal.    BP 124/70   Pulse 62   Temp (!) 97.5 F (36.4 C) (Temporal)   Resp 20   Ht 5' 6"  (1.676 m)   Wt 154 lb (69.9 kg)   SpO2 98%   BMI 24.86 kg/m   Right foot xray- negative for fracture-Mary-Margaret Hassell Done, FNP       Assessment & Plan:   Sergio Baker in today with chief complaint of Foot Pain (Right/)   1. Right foot pain Ice as needed Rest Elevate when sitting Compression wrap - DG Foot Complete Right    The above assessment and management plan was discussed with the patient. The patient verbalized understanding of and has agreed to the management plan. Patient is aware to call the clinic if symptoms persist or worsen. Patient is aware when to return to the clinic for a follow-up visit. Patient educated on when it is appropriate to go to the emergency department.   Mary-Margaret Hassell Done, FNP

## 2021-05-09 ENCOUNTER — Ambulatory Visit (INDEPENDENT_AMBULATORY_CARE_PROVIDER_SITE_OTHER): Payer: Medicare Other

## 2021-05-09 ENCOUNTER — Other Ambulatory Visit: Payer: Medicare Other

## 2021-05-09 ENCOUNTER — Other Ambulatory Visit: Payer: Self-pay

## 2021-05-09 DIAGNOSIS — I10 Essential (primary) hypertension: Secondary | ICD-10-CM

## 2021-05-09 DIAGNOSIS — Z23 Encounter for immunization: Secondary | ICD-10-CM

## 2021-05-10 LAB — CBC WITH DIFFERENTIAL/PLATELET
Basophils Absolute: 0 10*3/uL (ref 0.0–0.2)
Basos: 1 %
EOS (ABSOLUTE): 0.3 10*3/uL (ref 0.0–0.4)
Eos: 4 %
Hematocrit: 43.3 % (ref 37.5–51.0)
Hemoglobin: 14.3 g/dL (ref 13.0–17.7)
Immature Grans (Abs): 0 10*3/uL (ref 0.0–0.1)
Immature Granulocytes: 1 %
Lymphocytes Absolute: 1.6 10*3/uL (ref 0.7–3.1)
Lymphs: 24 %
MCH: 31.7 pg (ref 26.6–33.0)
MCHC: 33 g/dL (ref 31.5–35.7)
MCV: 96 fL (ref 79–97)
Monocytes Absolute: 0.9 10*3/uL (ref 0.1–0.9)
Monocytes: 13 %
Neutrophils Absolute: 3.7 10*3/uL (ref 1.4–7.0)
Neutrophils: 57 %
Platelets: 193 10*3/uL (ref 150–450)
RBC: 4.51 x10E6/uL (ref 4.14–5.80)
RDW: 13.1 % (ref 11.6–15.4)
WBC: 6.4 10*3/uL (ref 3.4–10.8)

## 2021-05-10 LAB — CMP14+EGFR
ALT: 16 IU/L (ref 0–44)
AST: 25 IU/L (ref 0–40)
Albumin/Globulin Ratio: 1.7 (ref 1.2–2.2)
Albumin: 4.5 g/dL (ref 3.6–4.6)
Alkaline Phosphatase: 105 IU/L (ref 44–121)
BUN/Creatinine Ratio: 12 (ref 10–24)
BUN: 11 mg/dL (ref 8–27)
Bilirubin Total: 1.1 mg/dL (ref 0.0–1.2)
CO2: 27 mmol/L (ref 20–29)
Calcium: 9.2 mg/dL (ref 8.6–10.2)
Chloride: 98 mmol/L (ref 96–106)
Creatinine, Ser: 0.89 mg/dL (ref 0.76–1.27)
Globulin, Total: 2.6 g/dL (ref 1.5–4.5)
Glucose: 128 mg/dL — ABNORMAL HIGH (ref 65–99)
Potassium: 3.5 mmol/L (ref 3.5–5.2)
Sodium: 144 mmol/L (ref 134–144)
Total Protein: 7.1 g/dL (ref 6.0–8.5)
eGFR: 85 mL/min/{1.73_m2} (ref >59)

## 2021-05-10 LAB — LIPID PANEL
Chol/HDL Ratio: 2.9 ratio (ref 0.0–5.0)
Cholesterol, Total: 108 mg/dL (ref 100–199)
HDL: 37 mg/dL — ABNORMAL LOW (ref >39)
LDL Chol Calc (NIH): 56 mg/dL (ref 0–99)
Triglycerides: 73 mg/dL (ref 0–149)
VLDL Cholesterol Cal: 15 mg/dL (ref 5–40)

## 2021-05-13 ENCOUNTER — Other Ambulatory Visit: Payer: Self-pay

## 2021-05-13 ENCOUNTER — Ambulatory Visit (INDEPENDENT_AMBULATORY_CARE_PROVIDER_SITE_OTHER): Payer: Medicare Other | Admitting: Nurse Practitioner

## 2021-05-13 ENCOUNTER — Encounter: Payer: Self-pay | Admitting: Nurse Practitioner

## 2021-05-13 VITALS — BP 110/61 | HR 65 | Temp 97.1°F | Resp 20 | Ht 66.0 in | Wt 150.0 lb

## 2021-05-13 DIAGNOSIS — Z6824 Body mass index (BMI) 24.0-24.9, adult: Secondary | ICD-10-CM

## 2021-05-13 DIAGNOSIS — E034 Atrophy of thyroid (acquired): Secondary | ICD-10-CM

## 2021-05-13 DIAGNOSIS — E785 Hyperlipidemia, unspecified: Secondary | ICD-10-CM | POA: Diagnosis not present

## 2021-05-13 DIAGNOSIS — N400 Enlarged prostate without lower urinary tract symptoms: Secondary | ICD-10-CM

## 2021-05-13 DIAGNOSIS — R413 Other amnesia: Secondary | ICD-10-CM

## 2021-05-13 DIAGNOSIS — R269 Unspecified abnormalities of gait and mobility: Secondary | ICD-10-CM | POA: Diagnosis not present

## 2021-05-13 DIAGNOSIS — K5730 Diverticulosis of large intestine without perforation or abscess without bleeding: Secondary | ICD-10-CM | POA: Diagnosis not present

## 2021-05-13 DIAGNOSIS — K219 Gastro-esophageal reflux disease without esophagitis: Secondary | ICD-10-CM

## 2021-05-13 DIAGNOSIS — I480 Paroxysmal atrial fibrillation: Secondary | ICD-10-CM | POA: Diagnosis not present

## 2021-05-13 DIAGNOSIS — G10 Huntington's disease: Secondary | ICD-10-CM

## 2021-05-13 DIAGNOSIS — I10 Essential (primary) hypertension: Secondary | ICD-10-CM

## 2021-05-13 DIAGNOSIS — F3342 Major depressive disorder, recurrent, in full remission: Secondary | ICD-10-CM

## 2021-05-13 MED ORDER — CITALOPRAM HYDROBROMIDE 20 MG PO TABS: 20.0000 mg | ORAL_TABLET | Freq: Every day | ORAL | 1 refills | Status: DC

## 2021-05-13 MED ORDER — ATORVASTATIN CALCIUM 80 MG PO TABS: 80.0000 mg | ORAL_TABLET | Freq: Every day | ORAL | 1 refills | Status: DC

## 2021-05-13 MED ORDER — APIXABAN 5 MG PO TABS: ORAL_TABLET | ORAL | 2 refills | Status: DC

## 2021-05-13 MED ORDER — OMEPRAZOLE 20 MG PO CPDR: 20.0000 mg | DELAYED_RELEASE_CAPSULE | Freq: Every day | ORAL | 1 refills | Status: DC

## 2021-05-13 MED ORDER — HALOPERIDOL 1 MG PO TABS: 1.5000 mg | ORAL_TABLET | Freq: Two times a day (BID) | ORAL | 1 refills | Status: DC

## 2021-05-13 MED ORDER — METOPROLOL SUCCINATE ER 25 MG PO TB24: 25.0000 mg | ORAL_TABLET | Freq: Every day | ORAL | 1 refills | Status: DC

## 2021-05-13 MED ORDER — LISINOPRIL-HYDROCHLOROTHIAZIDE 20-12.5 MG PO TABS: 1.0000 | ORAL_TABLET | Freq: Every day | ORAL | 1 refills | Status: DC

## 2021-05-13 MED ORDER — LEVOTHYROXINE SODIUM 50 MCG PO TABS: 50.0000 ug | ORAL_TABLET | Freq: Every day | ORAL | 1 refills | Status: DC

## 2021-05-13 NOTE — Progress Notes (Signed)
Subjective:    Patient ID: Sergio Baker, male    DOB: 04/07/1935, 86 y.o.   MRN: [id number]   Chief Complaint: medical management of chronic issues     HPI:  1. Essential hypertension No c/o chest pain, sob or headache. Does not check blood pressure at home. BP Readings from Last 3 Encounters:  03/27/21 124/70  03/11/21 130/64  02/10/21 129/63     2. Gastroesophageal reflux disease, unspecified whether esophagitis present Is on omperazole daily and is doing well.  3. Hypothyroidism due to acquired atrophy of thyroid Lab Results  Component Value Date   TSH 2.670 10/30/2020     4. Huntington's chorea ([place]) Is on MSM  and haldol and is doing well. Gait has worsened, but doing ok. Ha snot seen neurology in over 2 years. Says they really dont help and h=nerver make changes to meds.  5. Hyperlipidemia with target LDL less than 100 Appetite is decreasing. Is not able to do exercise. Lab Results  Component Value Date   CHOL 108 05/09/2021   HDL 37 (L) 05/09/2021   LDLCALC 56 05/09/2021   TRIG 73 05/09/2021   CHOLHDL 2.9 05/09/2021     6. Diverticulosis of large intestine without hemorrhage Has not had any recent flare ups  7. Abnormality of gait Has had no recent falls but is very unsteady on his feet. He is using a walker now.  8. Benign prostatic hyperplasia without lower urinary tract symptoms Has no issues voiding he says. But his wife says he goes frequently  9. Recurrent major depressive disorder, in full remission ([place]) Is on celexa and is doing well. Depression screen [facility] 2/9 05/13/2021 03/27/2021 02/10/2021  Decreased Interest 0 0 0  Down, Depressed, Hopeless 0 0 0  PHQ - 2 Score 0 0 0  Altered sleeping 0 - 0  Tired, decreased energy 0 - 0  Change in appetite 0 - 0  Feeling bad or failure about yourself  0 - 0  Trouble concentrating 0 - 0  Moving slowly or fidgety/restless 0 - 0  Suicidal thoughts 0 - 0  PHQ-9 Score 0 - 0  Difficult doing  work/chores - - Not difficult at all  Some recent data might be hidden     10. Memory deficits Wife says has been worsening.  11. BMI 24.0-24.9, adult Weightb is down 4lb since last vist Wt Readings from Last 3 Encounters:  05/13/21 150 lb (68 kg)  03/27/21 154 lb (69.9 kg)  03/11/21 154 lb (69.9 kg)   BP Readings from Last 3 Encounters:  05/13/21 110/61  03/27/21 124/70  03/11/21 130/64       Outpatient Encounter Medications as of 05/13/2021  Medication Sig   acetaminophen (TYLENOL) 650 MG CR tablet Take 1,300 mg by mouth daily as needed for pain.   Aloe Vera 25 MG CAPS Take 25 mg by mouth daily.   atorvastatin (LIPITOR) 80 MG tablet Take 1 tablet (80 mg total) by mouth at bedtime.   CALCIUM CITRATE PO Take 1 tablet by mouth daily.   Cholecalciferol (VITAMIN D3) 2000 UNITS capsule Take 2,000 Units by mouth daily.   citalopram (CELEXA) 20 MG tablet Take 1 tablet (20 mg total) by mouth daily.   ELIQUIS 5 MG TABS tablet TAKE ONE (1) TABLET BY MOUTH TWO (2) TIMES DAILY   Glucosamine-Chondroit-Vit C-Mn (GLUCOSAMINE CHONDR 1500 COMPLX PO) Take 1 tablet by mouth daily.   haloperidol (HALDOL) 1 MG tablet Take 1.5 tablets (1.5 mg total)  by mouth 2 (two) times daily.   levothyroxine (SYNTHROID) 50 MCG tablet Take 1 tablet (50 mcg total) by mouth daily before breakfast.   lisinopril-hydrochlorothiazide (ZESTORETIC) 20-12.5 MG tablet Take 1 tablet by mouth daily.   Methylsulfonylmethane (MSM) 1000 MG CAPS Take 1,000 mg by mouth daily.   metoprolol succinate (TOPROL-XL) 25 MG 24 hr tablet Take 1 tablet (25 mg total) by mouth daily.   Multiple Vitamin (MULTIVITAMIN WITH MINERALS) TABS Take 1 tablet by mouth daily. For Men 50+   mupirocin ointment (BACTROBAN) 2 %    Omega-3 Fatty Acids (FISH OIL PO) Take 1 tablet by mouth daily.   omeprazole (PRILOSEC) 20 MG capsule Take 1 capsule (20 mg total) by mouth daily.   No facility-administered encounter medications on file as of 05/13/2021.     Past Surgical History:  Procedure Laterality Date   APPENDECTOMY     BIOPSY  07/19/2017   Procedure: BIOPSY;  Surgeon: Daneil Dolin, MD;  [facility];  Service: Endoscopy;;  colon   BRAIN SURGERY     CATARACT EXTRACTION W/PHACO Left 05/07/2017   Procedure: CATARACT EXTRACTION PHACO AND INTRAOCULAR LENS PLACEMENT ([place]);  Surgeon: Baruch Goldmann, MD;  Location: [facility] OR[facility];  Service: Ophthalmology;  Laterality: Left;  CDE: 10.49   CATARACT EXTRACTION W/PHACO Right 06/04/2017   Procedure: CATARACT EXTRACTION PHACO AND INTRAOCULAR LENS PLACEMENT RIGHT EYE;  Surgeon: Baruch Goldmann, MD;  Location: [facility];  Service: Ophthalmology;  Laterality: Right;  CDE: 7.07   COLONOSCOPY  02/2010   patchy erythema, minimal granularity in distal rectum, left-sided diverticula, ileal diverticulum.  Bx benign. next TCS 02/2012   COLONOSCOPY  03/24/2012   Dr. Gala Romney- inflammatory changes of the rectum and colon consistent with history of U/C. bx= benign colonic mucosa   COLONOSCOPY N/A 10/05/2014   RMR: abnormal rectal and sigmoid mucosa as described above status post segmental biopsy. active colitis sigmoid colon.   COLONOSCOPY WITH PROPOFOL N/A 07/19/2017   Procedure: COLONOSCOPY WITH PROPOFOL;  Surgeon: Daneil Dolin, MD;  Location[facility];  Service: Endoscopy;  Laterality: N/A;  7:30am   cyst removed from urinary bladder     ESOPHAGOGASTRODUODENOSCOPY  04/2008   schatzki ring, s/p ED, hh   LUMBAR FUSION     SHOULDER ARTHROSCOPY W/ ROTATOR CUFF REPAIR Bilateral    TONSILLECTOMY      Family History  Problem Relation Age of Onset   Lung cancer Mother 14       deceased   Other Brother        Posttraumatic stress disorder   Cancer Brother        skin cancer   Cancer Sister        thyroid   Arthritis Sister    SIDS Brother    Huntington's disease Daughter    Colon cancer Neg Hx     New complaints: None today  Social history: Lives with wife  Controlled substance contract:  n/a      Review of Systems  Constitutional:  Negative for diaphoresis.  Eyes:  Negative for pain.  Respiratory:  Negative for shortness of breath.   Cardiovascular:  Negative for chest pain, palpitations and leg swelling.  Gastrointestinal:  Negative for abdominal pain.  Endocrine: Negative for polydipsia.  Musculoskeletal:  Positive for gait problem.  Skin:  Negative for rash.  Neurological:  Negative for dizziness, weakness and headaches.  Hematological:  Does not bruise/bleed easily.  All other systems reviewed and are negative.  Objective:   Physical Exam Vitals and nursing note reviewed.  Constitutional:      Appearance: Normal appearance. He is well-developed.  HENT:     Head: Normocephalic.     Nose: Nose normal.  Eyes:     Pupils: Pupils are equal, round, and reactive to light.  Neck:     Thyroid: No thyroid mass or thyromegaly.     Vascular: No carotid bruit or JVD.     Trachea: Phonation normal.  Cardiovascular:     Rate and Rhythm: Normal rate. Rhythm irregular.  Pulmonary:     Effort: Pulmonary effort is normal. No respiratory distress.     Breath sounds: Normal breath sounds.  Abdominal:     General: Bowel sounds are normal.     Palpations: Abdomen is soft.     Tenderness: There is no abdominal tenderness.  Musculoskeletal:        General: Normal range of motion.     Cervical back: Normal range of motion and neck supple.     Comments: gait shaky  Lymphadenopathy:     Cervical: No cervical adenopathy.  Skin:    General: Skin is warm and dry.  Neurological:     Mental Status: He is alert and oriented to person, place, and time.  Psychiatric:        Behavior: Behavior normal.        Thought Content: Thought content normal.        Judgment: Judgment normal.    BP 110/61   Pulse 65   Temp (!) 97.1 F (36.2 C) (Temporal)   Resp 20   Ht 5' 6"  (1.676 m)   Wt 150 lb (68 kg)   SpO2 96%   BMI 24.21 kg/m        Assessment & Plan:   RUFFUS KAMAKA comes in today with chief complaint of Medical Management of Chronic Issues   Diagnosis and orders addressed:  1. Essential hypertension Low sodium diet - lisinopril-hydrochlorothiazide (ZESTORETIC) 20-12.5 MG tablet; Take 1 tablet by mouth daily.  Dispense: 90 tablet; Refill: 1  2. Gastroesophageal reflux disease, unspecified whether esophagitis present Avoid spicy foods Do not eat 2 hours prior to bedtime - omeprazole (PRILOSEC) 20 MG capsule; Take 1 capsule (20 mg total) by mouth daily.  Dispense: 90 capsule; Refill: 1  3. Hypothyroidism due to acquired atrophy of thyroid Labs reviewed - levothyroxine (SYNTHROID) 50 MCG tablet; Take 1 tablet (50 mcg total) by mouth daily before breakfast.  Dispense: 90 tablet; Refill: 1  4. Huntington's chorea ([place]) Needs ot see neurologist if worsens - haloperidol (HALDOL) 1 MG tablet; Take 1.5 tablets (1.5 mg total) by mouth 2 (two) times daily.  Dispense: 270 tablet; Refill: 1  5. Hyperlipidemia with target LDL less than 100 Low fat diet - atorvastatin (LIPITOR) 80 MG tablet; Take 1 tablet (80 mg total) by mouth at bedtime.  Dispense: 90 tablet; Refill: 1  6. Diverticulosis of large intestine without hemorrhage Watch diet to prevent flare up  7. Abnormality of gait Fall preacutions  8. Benign prostatic hyperplasia without lower urinary tract symptoms Report any problems voiding  9. Recurrent major depressive disorder, in full remission ([place]) Stress management - citalopram (CELEXA) 20 MG tablet; Take 1 tablet (20 mg total) by mouth daily.  Dispense: 90 tablet; Refill: 1  10. Memory deficits Orient daily  11. BMI 24.0-24.9, adult Discussed diet and exercise for person with BMI >25 Will recheck weight in 3-6 months  12. Gastroesophageal reflux  disease Avoid spicy foods Do not eat 2 hours prior to bedtime  13. Paroxysmal atrial fibrillation (HCC) - apixaban (ELIQUIS) 5 MG TABS tablet; TAKE ONE (1) TABLET BY MOUTH TWO (2)  TIMES DAILY  Dispense: 60 tablet; Refill: 2 - metoprolol succinate (TOPROL-XL) 25 MG 24 hr tablet; Take 1 tablet (25 mg total) by mouth daily.  Dispense: 90 tablet; Refill: 1   Labs reviewed Health Maintenance reviewed Diet and exercise encouraged  Follow up plan: 3 months   Mary-Margaret Hassell Done, FNP

## 2021-05-13 NOTE — Patient Instructions (Signed)

## 2021-05-22 DIAGNOSIS — Z23 Encounter for immunization: Secondary | ICD-10-CM | POA: Diagnosis not present

## 2021-07-16 ENCOUNTER — Encounter: Payer: Self-pay | Admitting: Nurse Practitioner

## 2021-07-16 ENCOUNTER — Ambulatory Visit (INDEPENDENT_AMBULATORY_CARE_PROVIDER_SITE_OTHER): Payer: Medicare Other | Admitting: Nurse Practitioner

## 2021-07-16 VITALS — BP 146/72 | HR 58 | Temp 97.6°F | Resp 20 | Ht 66.0 in | Wt 146.0 lb

## 2021-07-16 DIAGNOSIS — N644 Mastodynia: Secondary | ICD-10-CM | POA: Diagnosis not present

## 2021-07-16 NOTE — Progress Notes (Signed)
   Subjective:    Patient ID: Sergio Baker, male    DOB: [birth year], 86 y.o.   MRN: [id number]   Chief Complaint: Left breast pain   HPI Patient comes in today c/o left breast pain. Says it feels sore when he touches it. He had breast cancer this past July, squamous cell carcinoma. Now it is sore. Saw derm 3 weeks ago and they said if soreness did not go away to see PCP. They have not spoke with sirgeon.    Review of Systems  Constitutional:  Negative for diaphoresis.  Eyes:  Negative for pain.  Respiratory:  Negative for shortness of breath.   Cardiovascular:  Negative for chest pain, palpitations and leg swelling.  Gastrointestinal:  Negative for abdominal pain.  Endocrine: Negative for polydipsia.  Skin:  Negative for rash.  Neurological:  Negative for dizziness, weakness and headaches.  Hematological:  Does not bruise/bleed easily.  All other systems reviewed and are negative.     Objective:   Physical Exam Vitals reviewed.  Constitutional:      Appearance: Normal appearance.  Cardiovascular:     Rate and Rhythm: Normal rate and regular rhythm.     Heart sounds: Normal heart sounds.  Pulmonary:     Effort: Pulmonary effort is normal.     Breath sounds: Normal breath sounds.  Chest:       Comments: Red- incision line Yellow- soreness Skin:    General: Skin is warm.  Neurological:     General: No focal deficit present.     Mental Status: He is alert and oriented to person, place, and time.    BP (!) 146/72   Pulse (!) 58   Temp 97.6 F (36.4 C) (Temporal)   Resp 20   Ht 5' 6"  (1.676 m)   Wt 146 lb (66.2 kg)   SpO2 96%   BMI 23.57 kg/m        Assessment & Plan:  Sergio Baker in today with chief complaint of Left breast pain   1. Breast pain, left Watch area Will talk after get test results - US BREAST COMPLETE UNI LEFT INC AXILLA    The above assessment and management plan was discussed with the patient. The patient verbalized  understanding of and has agreed to the management plan. Patient is aware to call the clinic if symptoms persist or worsen. Patient is aware when to return to the clinic for a follow-up visit. Patient educated on when it is appropriate to go to the emergency department.   Mary-Margaret Hassell Done, FNP

## 2021-07-17 ENCOUNTER — Other Ambulatory Visit: Payer: Self-pay

## 2021-07-17 DIAGNOSIS — N644 Mastodynia: Secondary | ICD-10-CM

## 2021-07-21 ENCOUNTER — Telehealth: Payer: Self-pay | Admitting: Nurse Practitioner

## 2021-07-21 ENCOUNTER — Other Ambulatory Visit (HOSPITAL_COMMUNITY): Payer: Self-pay | Admitting: Nurse Practitioner

## 2021-07-21 DIAGNOSIS — N644 Mastodynia: Secondary | ICD-10-CM

## 2021-07-21 NOTE — Telephone Encounter (Signed)
Pts wife called and wants MMM to send [place] order to [facility] because they can get in and out of that facility better than they can at [facility].  Wife also wants to know why she is being told by Manuela Schwartz at [facility] that pt has to have a mammogram before he can have an [place].

## 2021-07-21 NOTE — Telephone Encounter (Signed)
Sergio Baker is suppose to be contacting them

## 2021-07-22 NOTE — Telephone Encounter (Signed)
Spoke with wife - appt has been made for tomorrow at the [facility] in [place] - order faxed

## 2021-07-22 NOTE — Telephone Encounter (Signed)
Was sent to [facility] should be contacting them

## 2021-07-22 NOTE — Telephone Encounter (Signed)
Melissa can we change to [facility] ?

## 2021-07-23 DIAGNOSIS — N62 Hypertrophy of breast: Secondary | ICD-10-CM | POA: Diagnosis not present

## 2021-07-23 DIAGNOSIS — N644 Mastodynia: Secondary | ICD-10-CM | POA: Diagnosis not present

## 2021-07-23 DIAGNOSIS — C44521 Squamous cell carcinoma of skin of breast: Secondary | ICD-10-CM | POA: Diagnosis not present

## 2021-07-23 DIAGNOSIS — N6002 Solitary cyst of left breast: Secondary | ICD-10-CM | POA: Diagnosis not present

## 2021-08-06 ENCOUNTER — Other Ambulatory Visit: Payer: Medicare Other

## 2021-08-06 DIAGNOSIS — E034 Atrophy of thyroid (acquired): Secondary | ICD-10-CM

## 2021-08-06 DIAGNOSIS — I10 Essential (primary) hypertension: Secondary | ICD-10-CM

## 2021-08-06 DIAGNOSIS — K219 Gastro-esophageal reflux disease without esophagitis: Secondary | ICD-10-CM

## 2021-08-06 DIAGNOSIS — E785 Hyperlipidemia, unspecified: Secondary | ICD-10-CM

## 2021-08-07 LAB — CBC WITH DIFFERENTIAL/PLATELET
Basophils Absolute: 0.1 10*3/uL (ref 0.0–0.2)
Basos: 1 %
EOS (ABSOLUTE): 0.3 10*3/uL (ref 0.0–0.4)
Eos: 4 %
Hematocrit: 42.5 % (ref 37.5–51.0)
Hemoglobin: 14.1 g/dL (ref 13.0–17.7)
Immature Grans (Abs): 0 10*3/uL (ref 0.0–0.1)
Immature Granulocytes: 0 %
Lymphocytes Absolute: 1.3 10*3/uL (ref 0.7–3.1)
Lymphs: 22 %
MCH: 32.1 pg (ref 26.6–33.0)
MCHC: 33.2 g/dL (ref 31.5–35.7)
MCV: 97 fL (ref 79–97)
Monocytes Absolute: 0.7 10*3/uL (ref 0.1–0.9)
Monocytes: 11 %
Neutrophils Absolute: 3.8 10*3/uL (ref 1.4–7.0)
Neutrophils: 62 %
Platelets: 191 10*3/uL (ref 150–450)
RBC: 4.39 x10E6/uL (ref 4.14–5.80)
RDW: 12.9 % (ref 11.6–15.4)
WBC: 6.2 10*3/uL (ref 3.4–10.8)

## 2021-08-07 LAB — CMP14+EGFR
ALT: 20 IU/L (ref 0–44)
AST: 27 IU/L (ref 0–40)
Albumin/Globulin Ratio: 1.7 (ref 1.2–2.2)
Albumin: 4.5 g/dL (ref 3.6–4.6)
Alkaline Phosphatase: 97 IU/L (ref 44–121)
BUN/Creatinine Ratio: 12 (ref 10–24)
BUN: 10 mg/dL (ref 8–27)
Bilirubin Total: 1.5 mg/dL — ABNORMAL HIGH (ref 0.0–1.2)
CO2: 29 mmol/L (ref 20–29)
Calcium: 8.9 mg/dL (ref 8.6–10.2)
Chloride: 95 mmol/L — ABNORMAL LOW (ref 96–106)
Creatinine, Ser: 0.85 mg/dL (ref 0.76–1.27)
Globulin, Total: 2.6 g/dL (ref 1.5–4.5)
Glucose: 109 mg/dL — ABNORMAL HIGH (ref 70–99)
Potassium: 3.2 mmol/L — ABNORMAL LOW (ref 3.5–5.2)
Sodium: 142 mmol/L (ref 134–144)
Total Protein: 7.1 g/dL (ref 6.0–8.5)
eGFR: 86 mL/min/{1.73_m2} (ref >59)

## 2021-08-07 LAB — THYROID PANEL WITH TSH
Free Thyroxine Index: 2 (ref 1.2–4.9)
T3 Uptake Ratio: 29 % (ref 24–39)
T4, Total: 7 ug/dL (ref 4.5–12.0)
TSH: 3.92 u[IU]/mL (ref 0.450–4.500)

## 2021-08-07 LAB — LIPID PANEL
Chol/HDL Ratio: 3 ratio (ref 0.0–5.0)
Cholesterol, Total: 107 mg/dL (ref 100–199)
HDL: 36 mg/dL — ABNORMAL LOW (ref >39)
LDL Chol Calc (NIH): 55 mg/dL (ref 0–99)
Triglycerides: 76 mg/dL (ref 0–149)
VLDL Cholesterol Cal: 16 mg/dL (ref 5–40)

## 2021-08-07 MED ORDER — POTASSIUM CHLORIDE CRYS ER 10 MEQ PO TBCR: 10.0000 meq | EXTENDED_RELEASE_TABLET | Freq: Two times a day (BID) | ORAL | 1 refills | Status: DC

## 2021-08-07 NOTE — Addendum Note (Signed)
Addended by: Chevis Pretty on: 08/07/2021 01:42 PM   Modules accepted: Orders

## 2021-08-12 ENCOUNTER — Encounter: Payer: Self-pay | Admitting: Nurse Practitioner

## 2021-08-12 ENCOUNTER — Ambulatory Visit (INDEPENDENT_AMBULATORY_CARE_PROVIDER_SITE_OTHER): Payer: Medicare Other | Admitting: Nurse Practitioner

## 2021-08-12 VITALS — BP 130/72 | HR 73 | Temp 97.6°F | Resp 20 | Ht 66.0 in | Wt 147.0 lb

## 2021-08-12 DIAGNOSIS — F3342 Major depressive disorder, recurrent, in full remission: Secondary | ICD-10-CM

## 2021-08-12 DIAGNOSIS — E785 Hyperlipidemia, unspecified: Secondary | ICD-10-CM

## 2021-08-12 DIAGNOSIS — K219 Gastro-esophageal reflux disease without esophagitis: Secondary | ICD-10-CM

## 2021-08-12 DIAGNOSIS — E876 Hypokalemia: Secondary | ICD-10-CM

## 2021-08-12 DIAGNOSIS — N400 Enlarged prostate without lower urinary tract symptoms: Secondary | ICD-10-CM

## 2021-08-12 DIAGNOSIS — R269 Unspecified abnormalities of gait and mobility: Secondary | ICD-10-CM | POA: Diagnosis not present

## 2021-08-12 DIAGNOSIS — R413 Other amnesia: Secondary | ICD-10-CM | POA: Diagnosis not present

## 2021-08-12 DIAGNOSIS — E034 Atrophy of thyroid (acquired): Secondary | ICD-10-CM

## 2021-08-12 DIAGNOSIS — Z6824 Body mass index (BMI) 24.0-24.9, adult: Secondary | ICD-10-CM

## 2021-08-12 DIAGNOSIS — I4821 Permanent atrial fibrillation: Secondary | ICD-10-CM | POA: Diagnosis not present

## 2021-08-12 DIAGNOSIS — G10 Huntington's disease: Secondary | ICD-10-CM

## 2021-08-12 DIAGNOSIS — I10 Essential (primary) hypertension: Secondary | ICD-10-CM

## 2021-08-12 DIAGNOSIS — K5730 Diverticulosis of large intestine without perforation or abscess without bleeding: Secondary | ICD-10-CM

## 2021-08-12 NOTE — Progress Notes (Signed)
Subjective:    Patient ID: Sergio Baker, male    DOB: Feb 11, 1935, 86 y.o.   MRN: [id number]   Chief Complaint: No chief complaint on file.    HPI:  Sergio Baker is a 86 y.o. who identifies as a male who was assigned male at birth.   Social history: Lives with: wife Work history: retired   Scientist, forensic in today for follow up of the following chronic medical issues:  1. Essential hypertension No c/o chest pain, sob or headache. Dos not check blood pressure at home. BP Readings from Last 3 Encounters:  07/16/21 (!) 146/72  05/13/21 110/61  03/27/21 124/70     2. Hyperlipidemia with target LDL less than 100 Has a poor appetite. Does no exercise. Lab Results  Component Value Date   CHOL 107 08/06/2021   HDL 36 (L) 08/06/2021   LDLCALC 55 08/06/2021   TRIG 76 08/06/2021   CHOLHDL 3.0 08/06/2021     3. Hypothyroidism due to acquired atrophy of thyroid Np problems that aware of. Lab Results  Component Value Date   TSH 3.920 08/06/2021     4. Permanent atrial fibrillation (HCC) Denies palpitations or heart racing  5. Gastroesophageal reflux disease, unspecified whether esophagitis present Is on omeprazole daily and wife says he has not complained of any symptoms.  6. Huntington's chorea ([place]) Shaking seems to be worsening  7. Abnormality of gait Gait has become more unsteady. He has had no recent falls he has been using a walker and cane.  8. Benign prostatic hyperplasia without lower urinary tract symptoms He has not complained of any problems voiding Lab Results  Component Value Date   PSA1 5.3 (H) 05/08/2019   PSA1 5.5 (H) 04/28/2018   PSA1 5.3 (H) 03/12/2017   PSA 4.8 (H) 01/18/2014      9. Recurrent major depressive disorder, in full remission [facility]) Doing well according to wife.is on celexa daily Depression screen [facility] 2/9 08/12/2021 05/13/2021 03/27/2021  Decreased Interest 0 0 0  Down, Depressed, Hopeless 0 0 0  PHQ - 2 Score 0 0 0  Altered  sleeping 0 0 -  Tired, decreased energy 0 0 -  Change in appetite 0 0 -  Feeling bad or failure about yourself  0 0 -  Trouble concentrating 0 0 -  Moving slowly or fidgety/restless 0 0 -  Suicidal thoughts 0 0 -  PHQ-9 Score 0 0 -  Difficult doing work/chores Not difficult at all - -  Some recent data might be hidden     10. Diverticulosis of large intestine without hemorrhage Denies any recent flare ups  11. Memory deficits Wife says he repeats hisself a lot. Still recognizes people and know place and time.  12. BMI 24.0-24.9, adult No recent weight changes Wt Readings from Last 3 Encounters:  08/12/21 147 lb (66.7 kg)  07/16/21 146 lb (66.2 kg)  05/13/21 150 lb (68 kg)   BMI Readings from Last 3 Encounters:  08/12/21 23.73 kg/m  07/16/21 23.57 kg/m  05/13/21 24.21 kg/m     New complaints: None today  No Known Allergies Outpatient Encounter Medications as of 08/12/2021  Medication Sig   acetaminophen (TYLENOL) 650 MG CR tablet Take 1,300 mg by mouth daily as needed for pain.   Aloe Vera 25 MG CAPS Take 25 mg by mouth daily.   apixaban (ELIQUIS) 5 MG TABS tablet TAKE ONE (1) TABLET BY MOUTH TWO (2) TIMES DAILY   atorvastatin (LIPITOR) 80 MG tablet  Take 1 tablet (80 mg total) by mouth at bedtime.   CALCIUM CITRATE PO Take 1 tablet by mouth daily.   Cholecalciferol (VITAMIN D3) 2000 UNITS capsule Take 2,000 Units by mouth daily.   citalopram (CELEXA) 20 MG tablet Take 1 tablet (20 mg total) by mouth daily.   Glucosamine-Chondroit-Vit C-Mn (GLUCOSAMINE CHONDR 1500 COMPLX PO) Take 1 tablet by mouth daily.   haloperidol (HALDOL) 1 MG tablet Take 1.5 tablets (1.5 mg total) by mouth 2 (two) times daily.   levothyroxine (SYNTHROID) 50 MCG tablet Take 1 tablet (50 mcg total) by mouth daily before breakfast.   lisinopril-hydrochlorothiazide (ZESTORETIC) 20-12.5 MG tablet Take 1 tablet by mouth daily.   Methylsulfonylmethane (MSM) 1000 MG CAPS Take 1,000 mg by mouth daily.    metoprolol succinate (TOPROL-XL) 25 MG 24 hr tablet Take 1 tablet (25 mg total) by mouth daily.   Multiple Vitamin (MULTIVITAMIN WITH MINERALS) TABS Take 1 tablet by mouth daily. For Men 50+   mupirocin ointment (BACTROBAN) 2 %    Omega-3 Fatty Acids (FISH OIL PO) Take 1 tablet by mouth daily.   omeprazole (PRILOSEC) 20 MG capsule Take 1 capsule (20 mg total) by mouth daily.   potassium chloride (KLOR-CON M) 10 MEQ tablet Take 1 tablet (10 mEq total) by mouth 2 (two) times daily.   No facility-administered encounter medications on file as of 08/12/2021.    Past Surgical History:  Procedure Laterality Date   APPENDECTOMY     BIOPSY  07/19/2017   Procedure: BIOPSY;  Surgeon: Daneil Dolin, MD;  [facility];  Service: Endoscopy;;  colon   BRAIN SURGERY     CATARACT EXTRACTION W/PHACO Left 05/07/2017   Procedure: CATARACT EXTRACTION PHACO AND INTRAOCULAR LENS PLACEMENT ([place]);  Surgeon: Baruch Goldmann, MD;  Location: [facility];  Service: Ophthalmology;  Laterality: Left;  CDE: 10.49   CATARACT EXTRACTION W/PHACO Right 06/04/2017   Procedure: CATARACT EXTRACTION PHACO AND INTRAOCULAR LENS PLACEMENT RIGHT EYE;  Surgeon: Baruch Goldmann, MD;  Location: [facility];  Service: Ophthalmology;  Laterality: Right;  CDE: 7.07   COLONOSCOPY  02/2010   patchy erythema, minimal granularity in distal rectum, left-sided diverticula, ileal diverticulum.  Bx benign. next TCS 02/2012   COLONOSCOPY  03/24/2012   Dr. Gala Romney- inflammatory changes of the rectum and colon consistent with history of U/C. bx= benign colonic mucosa   COLONOSCOPY N/A 10/05/2014   RMR: abnormal rectal and sigmoid mucosa as described above status post segmental biopsy. active colitis sigmoid colon.   COLONOSCOPY WITH PROPOFOL N/A 07/19/2017   Procedure: COLONOSCOPY WITH PROPOFOL;  Surgeon: Daneil Dolin, MD;  Location: [facility];  Service: Endoscopy;  Laterality: N/A;  7:30am   cyst removed from urinary bladder      ESOPHAGOGASTRODUODENOSCOPY  04/2008   schatzki ring, s/p ED, hh   LUMBAR FUSION     SHOULDER ARTHROSCOPY W/ ROTATOR CUFF REPAIR Bilateral    TONSILLECTOMY      Family History  Problem Relation Age of Onset   Lung cancer Mother 5       deceased   Other Brother        Posttraumatic stress disorder   Cancer Brother        skin cancer   Cancer Sister        thyroid   Arthritis Sister    SIDS Brother    Huntington's disease Daughter    Colon cancer Neg Hx       Controlled substance contract: n/a  Review of Systems  Constitutional:  Negative for diaphoresis.  Eyes:  Negative for pain.  Respiratory:  Negative for shortness of breath.   Cardiovascular:  Negative for chest pain, palpitations and leg swelling.  Gastrointestinal:  Negative for abdominal pain.  Endocrine: Negative for polydipsia.  Skin:  Negative for rash.  Neurological:  Negative for dizziness, weakness and headaches.  Hematological:  Does not bruise/bleed easily.  All other systems reviewed and are negative.     Objective:   Physical Exam Vitals and nursing note reviewed.  Constitutional:      Appearance: Normal appearance. He is well-developed.  HENT:     Head: Normocephalic.     Nose: Nose normal.     Mouth/Throat:     Mouth: Mucous membranes are moist.     Pharynx: Oropharynx is clear.  Eyes:     Pupils: Pupils are equal, round, and reactive to light.  Neck:     Thyroid: No thyroid mass or thyromegaly.     Vascular: No carotid bruit or JVD.     Trachea: Phonation normal.  Cardiovascular:     Rate and Rhythm: Normal rate and regular rhythm.  Pulmonary:     Effort: Pulmonary effort is normal. No respiratory distress.     Breath sounds: Normal breath sounds.  Abdominal:     General: Bowel sounds are normal.     Palpations: Abdomen is soft.     Tenderness: There is no abdominal tenderness.  Musculoskeletal:        General: Normal range of motion.     Cervical back: Normal range of motion  and neck supple.  Lymphadenopathy:     Cervical: No cervical adenopathy.  Skin:    General: Skin is warm and dry.  Neurological:     Mental Status: He is alert and oriented to person, place, and time.     Comments: Speech is a little slurred from his huntington's  Psychiatric:        Behavior: Behavior normal.        Thought Content: Thought content normal.        Judgment: Judgment normal.    BP 130/72    Pulse 73    Temp 97.6 F (36.4 C) (Temporal)    Resp 20    Ht 5' 6"  (1.676 m)    Wt 147 lb (66.7 kg)    SpO2 95%    BMI 23.73 kg/m        Assessment & Plan:  JEANNE TERRANCE comes in today with chief complaint of Medical Management of Chronic Issues   Diagnosis and orders addressed:  1. Essential hypertension Low sodium diet  2. Hyperlipidemia with target LDL less than 100 Low fat diet  3. Hypothyroidism due to acquired atrophy of thyroid Labs pending  4. Permanent atrial fibrillation (HCC) Avoid caffeine  5. Gastroesophageal reflux disease, unspecified whether esophagitis present Avoid spicy foods Do not eat 2 hours prior to bedtime  6. Huntington's chorea ([place])  7. Abnormality of gait Fall precautions  8. Benign prostatic hyperplasia without lower urinary tract symptoms Report any problems voiding  9. Recurrent major depressive disorder, in full remission ([place]) Stress manaegment  10. Diverticulosis of large intestine without hemorrhage Watch diet to prevent flare ups  11. Memory deficits Orient daily  12. BMI 24.0-24.9, adult Discussed diet and exercise for person with BMI >25 Will recheck weight in 3-6 months   13. Hypokalemia Labs discussed Started on potassium supplement   Labs pending Health Maintenance  reviewed Diet and exercise encouraged  Follow up plan: 3 months   Mary-Margaret Hassell Done, FNP

## 2021-08-12 NOTE — Patient Instructions (Signed)

## 2021-08-18 ENCOUNTER — Other Ambulatory Visit: Payer: Self-pay | Admitting: Nurse Practitioner

## 2021-08-18 DIAGNOSIS — I480 Paroxysmal atrial fibrillation: Secondary | ICD-10-CM

## 2021-09-24 ENCOUNTER — Other Ambulatory Visit: Payer: Self-pay | Admitting: Nurse Practitioner

## 2021-11-06 ENCOUNTER — Other Ambulatory Visit: Payer: Self-pay | Admitting: Nurse Practitioner

## 2021-11-10 ENCOUNTER — Ambulatory Visit: Payer: Medicare Other | Admitting: Nurse Practitioner

## 2021-11-11 ENCOUNTER — Other Ambulatory Visit: Payer: Self-pay | Admitting: Nurse Practitioner

## 2021-11-11 DIAGNOSIS — I480 Paroxysmal atrial fibrillation: Secondary | ICD-10-CM

## 2021-11-12 ENCOUNTER — Other Ambulatory Visit: Payer: Self-pay

## 2021-11-12 ENCOUNTER — Other Ambulatory Visit: Payer: Medicare Other

## 2021-11-12 DIAGNOSIS — I10 Essential (primary) hypertension: Secondary | ICD-10-CM

## 2021-11-12 DIAGNOSIS — E785 Hyperlipidemia, unspecified: Secondary | ICD-10-CM

## 2021-11-13 LAB — CMP14+EGFR
ALT: 29 [IU]/L (ref 0–44)
AST: 39 [IU]/L (ref 0–40)
Albumin/Globulin Ratio: 1.4 (ref 1.2–2.2)
Albumin: 4.1 g/dL (ref 3.6–4.6)
Alkaline Phosphatase: 103 [IU]/L (ref 44–121)
BUN/Creatinine Ratio: 15 (ref 10–24)
BUN: 13 mg/dL (ref 8–27)
Bilirubin Total: 1.1 mg/dL (ref 0.0–1.2)
CO2: 26 mmol/L (ref 20–29)
Calcium: 8.6 mg/dL (ref 8.6–10.2)
Chloride: 98 mmol/L (ref 96–106)
Creatinine, Ser: 0.86 mg/dL (ref 0.76–1.27)
Globulin, Total: 2.9 g/dL (ref 1.5–4.5)
Glucose: 100 mg/dL — ABNORMAL HIGH (ref 70–99)
Potassium: 4.2 mmol/L (ref 3.5–5.2)
Sodium: 139 mmol/L (ref 134–144)
Total Protein: 7 g/dL (ref 6.0–8.5)
eGFR: 85 mL/min/{1.73_m2}

## 2021-11-13 LAB — LIPID PANEL
Chol/HDL Ratio: 3.4 ratio (ref 0.0–5.0)
Cholesterol, Total: 99 mg/dL — ABNORMAL LOW (ref 100–199)
HDL: 29 mg/dL — ABNORMAL LOW (ref >39)
LDL Chol Calc (NIH): 54 mg/dL (ref 0–99)
Triglycerides: 74 mg/dL (ref 0–149)
VLDL Cholesterol Cal: 16 mg/dL (ref 5–40)

## 2021-11-13 LAB — CBC WITH DIFFERENTIAL/PLATELET
Basophils Absolute: 0 10*3/uL (ref 0.0–0.2)
Basos: 0 %
EOS (ABSOLUTE): 0.2 10*3/uL (ref 0.0–0.4)
Eos: 3 %
Hematocrit: 43.6 % (ref 37.5–51.0)
Hemoglobin: 14.7 g/dL (ref 13.0–17.7)
Immature Grans (Abs): 0 10*3/uL (ref 0.0–0.1)
Immature Granulocytes: 0 %
Lymphocytes Absolute: 1.4 10*3/uL (ref 0.7–3.1)
Lymphs: 24 %
MCH: 32.5 pg (ref 26.6–33.0)
MCHC: 33.7 g/dL (ref 31.5–35.7)
MCV: 97 fL (ref 79–97)
Monocytes Absolute: 0.7 10*3/uL (ref 0.1–0.9)
Monocytes: 12 %
Neutrophils Absolute: 3.6 10*3/uL (ref 1.4–7.0)
Neutrophils: 61 %
Platelets: 247 10*3/uL (ref 150–450)
RBC: 4.52 x10E6/uL (ref 4.14–5.80)
RDW: 13 % (ref 11.6–15.4)
WBC: 5.9 10*3/uL (ref 3.4–10.8)

## 2021-11-14 ENCOUNTER — Encounter: Payer: Self-pay | Admitting: Nurse Practitioner

## 2021-11-14 ENCOUNTER — Ambulatory Visit (INDEPENDENT_AMBULATORY_CARE_PROVIDER_SITE_OTHER): Payer: Medicare Other | Admitting: Nurse Practitioner

## 2021-11-14 VITALS — BP 80/46 | HR 73 | Temp 98.3°F | Resp 20 | Ht 66.0 in | Wt 139.0 lb

## 2021-11-14 DIAGNOSIS — I4821 Permanent atrial fibrillation: Secondary | ICD-10-CM

## 2021-11-14 DIAGNOSIS — F3342 Major depressive disorder, recurrent, in full remission: Secondary | ICD-10-CM

## 2021-11-14 DIAGNOSIS — K219 Gastro-esophageal reflux disease without esophagitis: Secondary | ICD-10-CM

## 2021-11-14 DIAGNOSIS — E785 Hyperlipidemia, unspecified: Secondary | ICD-10-CM

## 2021-11-14 DIAGNOSIS — K5730 Diverticulosis of large intestine without perforation or abscess without bleeding: Secondary | ICD-10-CM

## 2021-11-14 DIAGNOSIS — E034 Atrophy of thyroid (acquired): Secondary | ICD-10-CM | POA: Diagnosis not present

## 2021-11-14 DIAGNOSIS — Z6824 Body mass index (BMI) 24.0-24.9, adult: Secondary | ICD-10-CM

## 2021-11-14 DIAGNOSIS — N400 Enlarged prostate without lower urinary tract symptoms: Secondary | ICD-10-CM

## 2021-11-14 DIAGNOSIS — R600 Localized edema: Secondary | ICD-10-CM

## 2021-11-14 DIAGNOSIS — R413 Other amnesia: Secondary | ICD-10-CM | POA: Diagnosis not present

## 2021-11-14 DIAGNOSIS — Z23 Encounter for immunization: Secondary | ICD-10-CM

## 2021-11-14 DIAGNOSIS — G10 Huntington's disease: Secondary | ICD-10-CM

## 2021-11-14 DIAGNOSIS — I480 Paroxysmal atrial fibrillation: Secondary | ICD-10-CM

## 2021-11-14 DIAGNOSIS — I10 Essential (primary) hypertension: Secondary | ICD-10-CM

## 2021-11-14 MED ORDER — APIXABAN 5 MG PO TABS: ORAL_TABLET | ORAL | 5 refills | Status: DC

## 2021-11-14 MED ORDER — POTASSIUM CHLORIDE ER 10 MEQ PO TBCR
EXTENDED_RELEASE_TABLET | ORAL | 1 refills | Status: DC
Start: 2021-11-14 — End: 2022-03-19

## 2021-11-14 MED ORDER — ATORVASTATIN CALCIUM 80 MG PO TABS: 80.0000 mg | ORAL_TABLET | Freq: Every day | ORAL | 1 refills | Status: DC

## 2021-11-14 MED ORDER — LEVOTHYROXINE SODIUM 50 MCG PO TABS: 50.0000 ug | ORAL_TABLET | Freq: Every day | ORAL | 1 refills | Status: DC

## 2021-11-14 MED ORDER — LISINOPRIL-HYDROCHLOROTHIAZIDE 20-12.5 MG PO TABS: 1.0000 | ORAL_TABLET | Freq: Every day | ORAL | 1 refills | Status: DC

## 2021-11-14 MED ORDER — HALOPERIDOL 1 MG PO TABS: 1.5000 mg | ORAL_TABLET | Freq: Two times a day (BID) | ORAL | 1 refills | Status: DC

## 2021-11-14 MED ORDER — METOPROLOL SUCCINATE ER 25 MG PO TB24: 25.0000 mg | ORAL_TABLET | Freq: Every day | ORAL | 1 refills | Status: DC

## 2021-11-14 MED ORDER — OMEPRAZOLE 20 MG PO CPDR: 20.0000 mg | DELAYED_RELEASE_CAPSULE | Freq: Every day | ORAL | 1 refills | Status: DC

## 2021-11-14 MED ORDER — CITALOPRAM HYDROBROMIDE 20 MG PO TABS: 20.0000 mg | ORAL_TABLET | Freq: Every day | ORAL | 1 refills | Status: DC

## 2021-11-14 NOTE — Progress Notes (Signed)
? ?Subjective:  ? ? Patient ID: Sergio Baker, male    DOB: 01/21/1935, 87 y.o.   MRN: [id number] ? ? ?Chief Complaint: medical management of chronic issues  ?  ? ?HPI: ? ?Sergio Baker is a 87 y.o. who identifies as a male who was assigned male at birth.  ? ?Social history: ?Lives with: wife ?Work history: retired ? ? ?Comes in today for follow up of the following chronic medical issues: ? ?1. Essential hypertension ?No c/o chest pain, sob or headache. Does not check blood pressure at home. ?BP Readings from Last 3 Encounters:  ?08/12/21 130/72  ?07/16/21 (!) 146/72  ?05/13/21 110/61  ? ? ? ?2. Hyperlipidemia with target LDL less than 100 ?Has a poor appetite. Does no dedicated exercise due to gait abnormality. ?Lab Results  ?Component Value Date  ? CHOL 99 (L) 11/12/2021  ? HDL 29 (L) 11/12/2021  ? [place] 54 11/12/2021  ? TRIG 74 11/12/2021  ? CHOLHDL 3.4 11/12/2021  ? ? ? ?3. Permanent atrial fibrillation ([place]) ?No c/o palpitations or heart racing. Is on eliquis with no bleeding issues ? ?4. Gastroesophageal reflux disease, unspecified whether esophagitis present ?Is on omeprazole daily and is doing well ? ?5. Diverticulosis of large intestine without hemorrhage ?No recent flare ups ? ?6. Hypothyroidism due to acquired atrophy of thyroid ?No problems that he is aware of ?Lab Results  ?Component Value Date  ? TSH 3.920 08/06/2021  ? ? ? ?7. Huntington's chorea ([place]) ?Gait is worsening. His wife has to help him a lot. He has had no recent falls. ? ?8. Benign prostatic hyperplasia without lower urinary tract symptoms ?No voiding issues ?Lab Results  ?Component Value Date  ? PSA1 5.3 (H) 05/08/2019  ? PSA1 5.5 (H) 04/28/2018  ? PSA1 5.3 (H) 03/12/2017  ? PSA 4.8 (H) 01/18/2014  ? ? ? ? ?9. Memory deficits ?Wife says his memory is gadually worsening. Repeats hisself a lot. ? ?10. Recurrent major depressive disorder, in full remission ([place]) ?Is on celexa dialy and is doing well ? ?11. Hypokalemia ?Is on daily  potassium supplement. Denies any muscle cramps. ? ?Lab Results  ?Component Value Date  ? K 4.2 11/12/2021  ? ? ? ?12. BMI 24.0-24.9, adult ?Weight is down 8lbs. Wife says he eats good and snacks. ?Wt Readings from Last 3 Encounters:  ?11/14/21 139 lb (63 kg)  ?08/12/21 147 lb (66.7 kg)  ?07/16/21 146 lb (66.2 kg)  ? ?BMI Readings from Last 3 Encounters:  ?11/14/21 22.44 kg/m?  ?08/12/21 23.73 kg/m?  ?07/16/21 23.57 kg/m?  ? ? ? ? ?New complaints: ?Has knots behind both of his knee- is getting larger. Denies any pain ? ?No Known Allergies ?Outpatient Encounter Medications as of 11/14/2021  ?Medication Sig  ? acetaminophen (TYLENOL) 650 MG CR tablet Take 1,300 mg by mouth daily as needed for pain.  ? Aloe Vera 25 MG CAPS Take 25 mg by mouth daily.  ? atorvastatin (LIPITOR) 80 MG tablet Take 1 tablet (80 mg total) by mouth at bedtime.  ? CALCIUM CITRATE PO Take 1 tablet by mouth daily.  ? Cholecalciferol (VITAMIN D3) 2000 UNITS capsule Take 2,000 Units by mouth daily.  ? citalopram (CELEXA) 20 MG tablet Take 1 tablet (20 mg total) by mouth daily.  ? ELIQUIS 5 MG TABS tablet TAKE ONE (1) TABLET BY MOUTH TWO (2) TIMES DAILY  ? Glucosamine-Chondroit-Vit C-Mn (GLUCOSAMINE CHONDR 1500 COMPLX PO) Take 1 tablet by mouth daily.  ? haloperidol (HALDOL) 1  MG tablet Take 1.5 tablets (1.5 mg total) by mouth 2 (two) times daily.  ? levothyroxine (SYNTHROID) 50 MCG tablet Take 1 tablet (50 mcg total) by mouth daily before breakfast.  ? lisinopril-hydrochlorothiazide (ZESTORETIC) 20-12.5 MG tablet Take 1 tablet by mouth daily.  ? Methylsulfonylmethane (MSM) 1000 MG CAPS Take 1,000 mg by mouth daily.  ? metoprolol succinate (TOPROL-XL) 25 MG 24 hr tablet Take 1 tablet (25 mg total) by mouth daily.  ? Multiple Vitamin (MULTIVITAMIN WITH MINERALS) TABS Take 1 tablet by mouth daily. For Men 50+  ? mupirocin ointment (BACTROBAN) 2 %   ? Omega-3 Fatty Acids (FISH OIL PO) Take 1 tablet by mouth daily.  ? omeprazole (PRILOSEC) 20 MG capsule  Take 1 capsule (20 mg total) by mouth daily.  ? potassium chloride (KLOR-CON) 10 MEQ tablet TAKE ONE (1) TABLET BY MOUTH TWO (2) TIMES DAILY  ? ?No facility-administered encounter medications on file as of 11/14/2021.  ? ? ?Past Surgical History:  ?Procedure Laterality Date  ? APPENDECTOMY    ? BIOPSY  07/19/2017  ? Procedure: BIOPSY;  Surgeon: Daneil Dolin, MD;  Location: [facility];  Service: Endoscopy;;  colon  ? BRAIN SURGERY    ? CATARACT EXTRACTION W/PHACO Left 05/07/2017  ? Procedure: CATARACT EXTRACTION PHACO AND INTRAOCULAR LENS PLACEMENT (IOC);  Surgeon: Baruch Goldmann, MD;  Location: [facility];  Service: Ophthalmology;  Laterality: Left;  CDE: 10.49  ? CATARACT EXTRACTION W/PHACO Right 06/04/2017  ? Procedure: CATARACT EXTRACTION PHACO AND INTRAOCULAR LENS PLACEMENT RIGHT EYE;  Surgeon: Baruch Goldmann, MD;  Location: [facility];  Service: Ophthalmology;  Laterality: Right;  CDE: 7.07  ? COLONOSCOPY  02/2010  ? patchy erythema, minimal granularity in distal rectum, left-sided diverticula, ileal diverticulum.  Bx benign. next TCS 02/2012  ? COLONOSCOPY  03/24/2012  ? Dr. Gala Romney- inflammatory changes of the rectum and colon consistent with history of U/C. bx= benign colonic mucosa  ? COLONOSCOPY N/A 10/05/2014  ? RMR: abnormal rectal and sigmoid mucosa as described above status post segmental biopsy. active colitis sigmoid colon.  ? COLONOSCOPY WITH PROPOFOL N/A 07/19/2017  ? Procedure: COLONOSCOPY WITH PROPOFOL;  Surgeon: Daneil Dolin, MD;  Location: [facility];  Service: Endoscopy;  Laterality: N/A;  7:30am  ? cyst removed from urinary bladder    ? ESOPHAGOGASTRODUODENOSCOPY  04/2008  ? schatzki ring, s/p ED, hh  ? LUMBAR FUSION    ? SHOULDER ARTHROSCOPY W/ ROTATOR CUFF REPAIR Bilateral   ? TONSILLECTOMY    ? ? ?Family History  ?Problem Relation Age of Onset  ? Lung cancer Mother 42  ?     deceased  ? Other Brother   ?     Posttraumatic stress disorder  ? Cancer Brother   ?     skin cancer  ? Cancer Sister   ?      thyroid  ? Arthritis Sister   ? SIDS Brother   ? Huntington's disease Daughter   ? Colon cancer Neg Hx   ? ? ? ? ?Controlled substance contract: n/a ? ? ? ? ?Review of Systems  ?Constitutional:  Negative for diaphoresis.  ?Eyes:  Negative for pain.  ?Respiratory:  Negative for shortness of breath.   ?Cardiovascular:  Negative for chest pain, palpitations and leg swelling.  ?Gastrointestinal:  Negative for abdominal pain.  ?Endocrine: Negative for polydipsia.  ?Skin:  Negative for rash.  ?Neurological:  Negative for dizziness, weakness and headaches.  ?Hematological:  Does not bruise/bleed easily.  ?All other  systems reviewed and are negative. ? ?   ?Objective:  ? Physical Exam ?Vitals and nursing note reviewed.  ?Constitutional:   ?   Appearance: Normal appearance. He is well-developed.  ?HENT:  ?   Head: Normocephalic.  ?   Nose: Nose normal.  ?   Mouth/Throat:  ?   Mouth: Mucous membranes are moist.  ?   Pharynx: Oropharynx is clear.  ?Eyes:  ?   Pupils: Pupils are equal, round, and reactive to light.  ?Neck:  ?   Thyroid: No thyroid mass or thyromegaly.  ?   Vascular: No carotid bruit or JVD.  ?   Trachea: Phonation normal.  ?Cardiovascular:  ?   Rate and Rhythm: Normal rate and regular rhythm.  ?Pulmonary:  ?   Effort: Pulmonary effort is normal. No respiratory distress.  ?   Breath sounds: Normal breath sounds.  ?Abdominal:  ?   General: Bowel sounds are normal.  ?   Palpations: Abdomen is soft.  ?   Tenderness: There is no abdominal tenderness.  ?Musculoskeletal:     ?   General: Normal range of motion.  ?   Cervical back: Normal range of motion and neck supple.  ?   Comments: Gait very slow and unsteady- is using a walker ?Large nontender nodule posterior knees  ?Lymphadenopathy:  ?   Cervical: No cervical adenopathy.  ?Skin: ?   General: Skin is warm and dry.  ?Neurological:  ?   Mental Status: He is alert and oriented to person, place, and time.  ?Psychiatric:     ?   Behavior: Behavior normal.     ?    Thought Content: Thought content normal.     ?   Judgment: Judgment normal.  ? ? ?BP (!) 80/46   Pulse 73   Temp 98.3 ?F (36.8 ?C) (Temporal)   Resp 20   Ht 5' 6"  (1.676 m)   Wt 139 lb (63 kg)   SpO2 96%   B

## 2021-11-14 NOTE — Addendum Note (Signed)
Addended by: Rolena Infante on: 11/14/2021 05:07 PM ? ? Modules accepted: Orders ? ?

## 2021-11-14 NOTE — Patient Instructions (Signed)

## 2021-11-19 ENCOUNTER — Telehealth: Payer: Self-pay | Admitting: Nurse Practitioner

## 2021-11-25 ENCOUNTER — Other Ambulatory Visit: Payer: Self-pay | Admitting: *Deleted

## 2021-11-25 DIAGNOSIS — I480 Paroxysmal atrial fibrillation: Secondary | ICD-10-CM

## 2021-11-25 MED ORDER — APIXABAN 5 MG PO TABS: ORAL_TABLET | ORAL | 3 refills | Status: DC

## 2021-12-01 ENCOUNTER — Ambulatory Visit
Admission: RE | Admit: 2021-12-01 | Discharge: 2021-12-01 | Disposition: A | Discharge status: Home / Self Care | Payer: Medicare Other | Source: Ambulatory Visit | Attending: Nurse Practitioner | Admitting: Nurse Practitioner

## 2021-12-01 DIAGNOSIS — R600 Localized edema: Secondary | ICD-10-CM | POA: Diagnosis not present

## 2021-12-03 ENCOUNTER — Telehealth: Payer: Self-pay | Admitting: *Deleted

## 2021-12-03 NOTE — Telephone Encounter (Signed)
[facility]-PAF 3% out of pocket expense is $1,130.09 ?Wife informed ?

## 2021-12-15 ENCOUNTER — Telehealth: Payer: Self-pay | Admitting: Nurse Practitioner

## 2021-12-15 ENCOUNTER — Ambulatory Visit (INDEPENDENT_AMBULATORY_CARE_PROVIDER_SITE_OTHER): Payer: Medicare Other

## 2021-12-15 ENCOUNTER — Telehealth: Payer: Self-pay

## 2021-12-15 VITALS — BP 149/90 | HR 76 | Ht 66.0 in | Wt 134.0 lb

## 2021-12-15 DIAGNOSIS — Z0000 Encounter for general adult medical examination without abnormal findings: Secondary | ICD-10-CM | POA: Diagnosis not present

## 2021-12-15 NOTE — Patient Instructions (Signed)
[name] , ?Thank you for taking time to come for your Medicare Wellness Visit. I appreciate your ongoing commitment to your health goals. Please review the following plan we discussed and let me know if I can assist you in the future.  ? ?Screening recommendations/referrals: ?Colonoscopy: No longer required ?Recommended yearly ophthalmology/optometry visit for glaucoma screening and checkup ?Recommended yearly dental visit for hygiene and checkup ? ?Vaccinations: ?Influenza vaccine: Done 05/09/2021 - Repeat annually ?Pneumococcal vaccine: Done 05/18/2007 & 10/25/2014 ?Tdap vaccine: Done 01/27/2008 - Repeat in 10 years *due at next visit ?Shingles vaccine: Done 11/14/2021 - get second dose at next visit   ?Covid-19: Done  09/21/2019, 10/20/2019, & 07/01/2020 ? ?Advanced directives: Please bring a copy of your health care power of attorney and living will to the office to be added to your chart at your convenience.  ? ?Conditions/risks identified: Aim for 30 minutes of exercise or brisk walking, 6-8 glasses of water, and 5 servings of fruits and vegetables each day.  ? ?Next appointment: Follow up in one year for your annual wellness visit.  ? ?Preventive Care 85 Years and Older, Male ? ?Preventive care refers to lifestyle choices and visits with your health care provider that can promote health and wellness. ?What does preventive care include? ?A yearly physical exam. This is also called an annual well check. ?Dental exams once or twice a year. ?Routine eye exams. Ask your health care provider how often you should have your eyes checked. ?Personal lifestyle choices, including: ?Daily care of your teeth and gums. ?Regular physical activity. ?Eating a healthy diet. ?Avoiding tobacco and drug use. ?Limiting alcohol use. ?Practicing safe sex. ?Taking low doses of aspirin every day. ?Taking vitamin and mineral supplements as recommended by your health care provider. ?What happens during an annual well check? ?The services and  screenings done by your health care provider during your annual well check will depend on your age, overall health, lifestyle risk factors, and family history of disease. ?Counseling  ?Your health care provider may ask you questions about your: ?Alcohol use. ?Tobacco use. ?Drug use. ?Emotional well-being. ?Home and relationship well-being. ?Sexual activity. ?Eating habits. ?History of falls. ?Memory and ability to understand (cognition). ?Work and work Statistician. ?Screening  ?You may have the following tests or measurements: ?Height, weight, and BMI. ?Blood pressure. ?Lipid and cholesterol levels. These may be checked every 5 years, or more frequently if you are over 3 years old. ?Skin check. ?Lung cancer screening. You may have this screening every year starting at age 85 if you have a 30-pack-year history of smoking and currently smoke or have quit within the past 15 years. ?Fecal occult blood test (FOBT) of the stool. You may have this test every year starting at age 85. ?Flexible sigmoidoscopy or colonoscopy. You may have a sigmoidoscopy every 5 years or a colonoscopy every 10 years starting at age 85. ?85 prostate cancer screening. Recommendations will vary depending on your family history and other risks. ?Hepatitis C blood test. ?Hepatitis B blood test. ?Sexually transmitted disease (STD) testing. ?Diabetes screening. This is done by checking your blood sugar (glucose) after you have not eaten for a while (fasting). You may have this done every 1-3 years. ?Abdominal aortic aneurysm (AAA) screening. You may need this if you are a current or former smoker. ?Osteoporosis. You may be screened starting at age 40 if you are at high risk. ?Talk with your health care provider about your test results, treatment options, and if necessary, the need for more  tests. ?Vaccines  ?Your health care provider may recommend certain vaccines, such as: ?Influenza vaccine. This is recommended every year. ?Tetanus, diphtheria, and  acellular pertussis (Tdap, Td) vaccine. You may need a Td booster every 10 years. ?Zoster vaccine. You may need this after age 56. ?Pneumococcal 13-valent conjugate (PCV13) vaccine. One dose is recommended after age 36. ?Pneumococcal polysaccharide (PPSV23) vaccine. One dose is recommended after age 19. ?Talk to your health care provider about which screenings and vaccines you need and how often you need them. ?This information is not intended to replace advice given to you by your health care provider. Make sure you discuss any questions you have with your health care provider. ?Document Released: 08/30/2015 Document Revised: 04/22/2016 Document Reviewed: 06/04/2015 ?Elsevier Interactive Patient Education ? 2017 [employer]. ? ?Fall Prevention in the Home ?Falls can cause injuries. They can happen to people of all ages. There are many things you can do to make your home safe and to help prevent falls. ?What can I do on the outside of my home? ?Regularly fix the edges of walkways and driveways and fix any cracks. ?Remove anything that might make you trip as you walk through a door, such as a raised step or threshold. ?Trim any bushes or trees on the path to your home. ?Use bright outdoor lighting. ?Clear any walking paths of anything that might make someone trip, such as rocks or tools. ?Regularly check to see if handrails are loose or broken. Make sure that both sides of any steps have handrails. ?Any raised decks and porches should have guardrails on the edges. ?Have any leaves, snow, or ice cleared regularly. ?Use sand or salt on walking paths during winter. ?Clean up any spills in your garage right away. This includes oil or grease spills. ?What can I do in the bathroom? ?Use night lights. ?Install grab bars by the toilet and in the tub and shower. Do not use towel bars as grab bars. ?Use non-skid mats or decals in the tub or shower. ?If you need to sit down in the shower, use a plastic, non-slip stool. ?Keep  the floor dry. Clean up any water that spills on the floor as soon as it happens. ?Remove soap buildup in the tub or shower regularly. ?Attach bath mats securely with double-sided non-slip rug tape. ?Do not have throw rugs and other things on the floor that can make you trip. ?What can I do in the bedroom? ?Use night lights. ?Make sure that you have a light by your bed that is easy to reach. ?Do not use any sheets or blankets that are too big for your bed. They should not hang down onto the floor. ?Have a firm chair that has side arms. You can use this for support while you get dressed. ?Do not have throw rugs and other things on the floor that can make you trip. ?What can I do in the kitchen? ?Clean up any spills right away. ?Avoid walking on wet floors. ?Keep items that you use a lot in easy-to-reach places. ?If you need to reach something above you, use a strong step stool that has a grab bar. ?Keep electrical cords out of the way. ?Do not use floor polish or wax that makes floors slippery. If you must use wax, use non-skid floor wax. ?Do not have throw rugs and other things on the floor that can make you trip. ?What can I do with my stairs? ?Do not leave any items on the stairs. ?Make sure  that there are handrails on both sides of the stairs and use them. Fix handrails that are broken or loose. Make sure that handrails are as long as the stairways. ?Check any carpeting to make sure that it is firmly attached to the stairs. Fix any carpet that is loose or worn. ?Avoid having throw rugs at the top or bottom of the stairs. If you do have throw rugs, attach them to the floor with carpet tape. ?Make sure that you have a light switch at the top of the stairs and the bottom of the stairs. If you do not have them, ask someone to add them for you. ?What else can I do to help prevent falls? ?Wear shoes that: ?Do not have high heels. ?Have rubber bottoms. ?Are comfortable and fit you well. ?Are closed at the toe. Do not  wear sandals. ?If you use a stepladder: ?Make sure that it is fully opened. Do not climb a closed stepladder. ?Make sure that both sides of the stepladder are locked into place. ?Ask someone to hold it fo

## 2021-12-15 NOTE — Progress Notes (Signed)
? ?Subjective:  ? Sergio Baker is a 87 y.o. male who presents for Medicare Annual/Subsequent preventive examination. ? ?Virtual Visit via Telephone Note ? ?I connected with  Sergio Baker on 12/15/21 at 10:30 AM EDT by telephone and verified that I am speaking with the correct person using two identifiers. ? ?Location: ?Patient: Home ?Provider: [facility] ?Persons participating in the virtual visit: patient and his wife [place] ?  ?I discussed the limitations, risks, security and privacy concerns of performing an evaluation and management service by telephone and the availability of in person appointments. The patient expressed understanding and agreed to proceed. ? ?Interactive audio and video telecommunications were attempted between this nurse and patient, however failed, due to patient having technical difficulties OR patient did not have access to video capability.  We continued and completed visit with audio only. ? ?Some vital signs may be absent or patient reported.  ? ?Khanh Cordner Dionne Ano, LPN  ? ?Review of Systems    ? ?Cardiac Risk Factors include: advanced age (>43mn, >>1women);male gender;sedentary lifestyle;dyslipidemia;hypertension;Other (see comment), Risk factor comments: A.Fib, Huntington's Chorea, hx of stroke ? ?   ?Objective:  ?  ?Today's Vitals  ? 12/15/21 1033  ?BP: (!) 149/90  ?Pulse: 76  ?Weight: 134 lb (60.8 kg)  ?Height: 5' 6"  (1.676 m)  ? ?Body mass index is 21.63 kg/m?. ? ? ?  12/15/2021  ? 11:28 AM 12/12/2020  ?  2:04 PM 12/04/2019  ?  2:14 PM 06/14/2018  ?  2:49 PM 07/19/2017  ?  6:42 AM 07/14/2017  ?  1:15 PM 06/04/2017  ? 11:43 AM  ?Advanced Directives  ?Does Patient Have a Medical Advance Directive? Yes Yes Yes Yes No No No  ?Type of AParamedicof [place] Vining[place] will [place] will Living will;Healthcare Power of [place] will     ?Does patient want to make changes to medical advance  directive?   No - Patient declined No - Patient declined     ?Copy of HAlleghenyin Chart? No - copy requested No - copy requested No - copy requested No - copy requested     ?Would patient like information on creating a medical advance directive?     No - Patient declined No - Patient declined No - Patient declined  ? ? ?Current Medications (verified) ?Outpatient Encounter Medications as of 12/15/2021  ?Medication Sig  ? acetaminophen (TYLENOL) 650 MG CR tablet Take 1,300 mg by mouth daily as needed for pain.  ? Aloe Vera 25 MG CAPS Take 25 mg by mouth daily.  ? apixaban (ELIQUIS) 5 MG TABS tablet TAKE ONE (1) TABLET BY MOUTH TWO (2) TIMES DAILY  ? atorvastatin (LIPITOR) 80 MG tablet Take 1 tablet (80 mg total) by mouth at bedtime.  ? CALCIUM CITRATE PO Take 1 tablet by mouth daily.  ? Cholecalciferol (VITAMIN D3) 2000 UNITS capsule Take 2,000 Units by mouth daily.  ? citalopram (CELEXA) 20 MG tablet Take 1 tablet (20 mg total) by mouth daily.  ? Glucosamine-Chondroit-Vit C-Mn (GLUCOSAMINE CHONDR 1500 COMPLX PO) Take 1 tablet by mouth daily.  ? haloperidol (HALDOL) 1 MG tablet Take 1.5 tablets (1.5 mg total) by mouth 2 (two) times daily.  ? levothyroxine (SYNTHROID) 50 MCG tablet Take 1 tablet (50 mcg total) by mouth daily before breakfast.  ? lisinopril-hydrochlorothiazide (ZESTORETIC) 20-12.5 MG tablet Take 1 tablet by mouth daily.  ? metoprolol succinate (TOPROL-XL) 25 MG 24  hr tablet Take 1 tablet (25 mg total) by mouth daily.  ? Multiple Vitamin (MULTIVITAMIN WITH MINERALS) TABS Take 1 tablet by mouth daily. For Men 50+  ? mupirocin ointment (BACTROBAN) 2 %   ? Omega-3 Fatty Acids (FISH OIL PO) Take 1 tablet by mouth daily.  ? omeprazole (PRILOSEC) 20 MG capsule Take 1 capsule (20 mg total) by mouth daily.  ? potassium chloride (KLOR-CON) 10 MEQ tablet TAKE ONE (1) TABLET BY MOUTH TWO (2) TIMES DAILY  ? Methylsulfonylmethane (MSM) 1000 MG CAPS Take 1,000 mg by mouth daily. (Patient not taking:  Reported on 12/15/2021)  ? ?No facility-administered encounter medications on file as of 12/15/2021.  ? ? ?Allergies (verified) ?Patient has no known allergies.  ? ?History: ?Past Medical History:  ?Diagnosis Date  ? Abnormality of gait   ? Arthritis   ? Atrial fibrillation ([place])   ? Depression   ? Diverticula of colon   ? Enlarged prostate   ? GERD (gastroesophageal reflux disease)   ? Hearing loss   ? Hemorrhoids   ? Hiatal hernia   ? Huntington's disease ([place]) 2010  ? Hyperlipidemia   ? Hypothyroidism   ? Memory deficits 01/17/2014  ? 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 Diagnosed in the 1980s  ? Schatzki's ring   ? Stroke (cerebrum) ([place])   ? Ulcerative colitis ([place])   ? ?Past Surgical History:  ?Procedure Laterality Date  ? APPENDECTOMY    ? BIOPSY  07/19/2017  ? Procedure: BIOPSY;  Surgeon: Daneil Dolin, MD;  [facility];  Service: Endoscopy;;  colon  ? BRAIN SURGERY    ? CATARACT EXTRACTION W/PHACO Left 05/07/2017  ? Procedure: CATARACT EXTRACTION PHACO AND INTRAOCULAR LENS PLACEMENT (IOC);  Surgeon: Baruch Goldmann, MD;  Location: [facility];  Service: Ophthalmology;  Laterality: Left;  CDE: 10.49  ? CATARACT EXTRACTION W/PHACO Right 06/04/2017  ? Procedure: CATARACT EXTRACTION PHACO AND INTRAOCULAR LENS PLACEMENT RIGHT EYE;  Surgeon: Baruch Goldmann, MD;  Location: [facility];  Service: Ophthalmology;  Laterality: Right;  CDE: 7.07  ? COLONOSCOPY  02/2010  ? patchy erythema, minimal granularity in distal rectum, left-sided diverticula, ileal diverticulum.  Bx benign. next TCS 02/2012  ? COLONOSCOPY  03/24/2012  ? Dr. Gala Romney- inflammatory changes of the rectum and colon consistent with history of U/C. bx= benign colonic mucosa  ? COLONOSCOPY N/A 10/05/2014  ? RMR: abnormal rectal and sigmoid mucosa as described above status post segmental biopsy. active colitis sigmoid colon.  ? COLONOSCOPY WITH PROPOFOL N/A 07/19/2017  ? Procedure: COLONOSCOPY WITH PROPOFOL;  Surgeon: Daneil Dolin, MD;  [facility];   Service: Endoscopy;  Laterality: N/A;  7:30am  ? cyst removed from urinary bladder    ? ESOPHAGOGASTRODUODENOSCOPY  04/2008  ? schatzki ring, s/p ED, hh  ? LUMBAR FUSION    ? SHOULDER ARTHROSCOPY W/ ROTATOR CUFF REPAIR Bilateral   ? TONSILLECTOMY    ? ?Family History  ?Problem Relation Age of Onset  ? Lung cancer Mother 42  ?     deceased  ? Other Brother   ?     Posttraumatic stress disorder  ? Cancer Brother   ?     skin cancer  ? Cancer Sister   ?     thyroid  ? Arthritis Sister   ? SIDS Brother   ? Huntington's disease Daughter   ? Colon cancer Neg Hx   ? ?Social History  ? ?Socioeconomic History  ? Marital status: Married  ?  Spouse name: [name]  ? Number of children: 1  ? Years of education: 12th  ? Highest education level: 12th grade  ?Occupational History  ? Occupation: retired Furniture conservator/restorer  ? Occupation: works as Administrator, arts at [employer]  ?Tobacco Use  ? Smoking status: Former  ?  Packs/day: 0.25  ?  Years: 8.00  ?  Pack years: 2.00  ?  Types: Cigarettes  ?  Quit date: 05/03/1973  ?  Years since quitting: 48.6  ? Smokeless tobacco: Never  ? Tobacco comments:  ?  Quit 1970's   ?Vaping Use  ? Vaping Use: Never used  ?Substance and Sexual Activity  ? Alcohol use: No  ?  Alcohol/week: 0.0 standard drinks  ? Drug use: No  ? Sexual activity: Not on file  ?Other Topics Concern  ? Not on file  ?Social History Narrative  ? Patient lives at home with his wife.   ? ?Social Determinants of Health  ? ?Financial Resource Strain: Medium Risk  ? Difficulty of Paying Living Expenses: Somewhat hard  ?Food Insecurity: No Food Insecurity  ? Worried About Charity fundraiser in the Last Year: Never true  ? Ran Out of Food in the Last Year: Never true  ?Transportation Needs: No Transportation Needs  ? Lack of Transportation (Medical): No  ? Lack of Transportation (Non-Medical): No  ?Physical Activity: Insufficiently Active  ? Days of Exercise per Week: 7 days  ? Minutes of Exercise per Session: 10 min  ?Stress: No Stress Concern  Present  ? Feeling of Stress : Only a little  ?Social Connections: Socially Integrated  ? Frequency of Communication with Friends and Family: More than three times a week  ? Frequency of Social Gatherings wit

## 2021-12-15 NOTE — Telephone Encounter (Signed)
At last visit, his BP was very low, so he was advised to stop BP meds - since then, BP has slowly gone up and now consistently running around 150/90 ?

## 2021-12-15 NOTE — Telephone Encounter (Signed)
Start back on 1.2 tablet of lisinopril/HCTZ and keep diary of blood pressure ?

## 2021-12-15 NOTE — Telephone Encounter (Signed)
Called and spoke with wife who verbalized understanding. Advised to continue with BP checks and let us know how things are going once med is restarted.  ?

## 2021-12-16 NOTE — Telephone Encounter (Signed)
Wife wants Sergio Baker to call her about pts BP. ?

## 2021-12-16 NOTE — Telephone Encounter (Signed)
Called and spoke with wife. She states that after checking patients meds, he hasn't been taking the lisinopril since last August. He has been taking the metoprolol regularly. Advised to start lisinopril back starting taking 1/2 daily and to monitor BP. Patients wife verbalized understanding ?

## 2021-12-19 DIAGNOSIS — M25561 Pain in right knee: Secondary | ICD-10-CM | POA: Diagnosis not present

## 2021-12-19 DIAGNOSIS — M25562 Pain in left knee: Secondary | ICD-10-CM | POA: Diagnosis not present

## 2021-12-25 DIAGNOSIS — M25561 Pain in right knee: Secondary | ICD-10-CM | POA: Diagnosis not present

## 2021-12-25 DIAGNOSIS — M25562 Pain in left knee: Secondary | ICD-10-CM | POA: Diagnosis not present

## 2022-01-23 DIAGNOSIS — M25561 Pain in right knee: Secondary | ICD-10-CM | POA: Diagnosis not present

## 2022-01-23 DIAGNOSIS — M25512 Pain in left shoulder: Secondary | ICD-10-CM | POA: Diagnosis not present

## 2022-01-23 DIAGNOSIS — M25562 Pain in left knee: Secondary | ICD-10-CM | POA: Diagnosis not present

## 2022-02-12 ENCOUNTER — Other Ambulatory Visit: Payer: Medicare Other

## 2022-02-12 DIAGNOSIS — E785 Hyperlipidemia, unspecified: Secondary | ICD-10-CM | POA: Diagnosis not present

## 2022-02-12 DIAGNOSIS — I10 Essential (primary) hypertension: Secondary | ICD-10-CM

## 2022-02-12 DIAGNOSIS — R739 Hyperglycemia, unspecified: Secondary | ICD-10-CM | POA: Diagnosis not present

## 2022-02-12 LAB — BAYER DCA HB A1C WAIVED: HB A1C (BAYER DCA - WAIVED): 6.5 % — ABNORMAL HIGH (ref 4.8–5.6)

## 2022-02-12 NOTE — Progress Notes (Signed)
Cbc clotted during draw. Patient needs to return to have cbc recollected.

## 2022-02-13 LAB — CMP14+EGFR
ALT: 41 IU/L (ref 0–44)
AST: 47 IU/L — ABNORMAL HIGH (ref 0–40)
Albumin/Globulin Ratio: 1.5 (ref 1.2–2.2)
Albumin: 4.1 g/dL (ref 3.6–4.6)
Alkaline Phosphatase: 77 IU/L (ref 44–121)
BUN/Creatinine Ratio: 23 (ref 10–24)
BUN: 20 mg/dL (ref 8–27)
Bilirubin Total: 1 mg/dL (ref 0.0–1.2)
CO2: 21 mmol/L (ref 20–29)
Calcium: 9.3 mg/dL (ref 8.6–10.2)
Chloride: 99 mmol/L (ref 96–106)
Creatinine, Ser: 0.86 mg/dL (ref 0.76–1.27)
Globulin, Total: 2.7 g/dL (ref 1.5–4.5)
Glucose: 101 mg/dL — ABNORMAL HIGH (ref 70–99)
Potassium: 5.3 mmol/L — ABNORMAL HIGH (ref 3.5–5.2)
Sodium: 139 mmol/L (ref 134–144)
Total Protein: 6.8 g/dL (ref 6.0–8.5)
eGFR: 85 mL/min/{1.73_m2} (ref >59)

## 2022-02-13 LAB — LIPID PANEL
Chol/HDL Ratio: 2.7 ratio (ref 0.0–5.0)
Cholesterol, Total: 126 mg/dL (ref 100–199)
HDL: 47 mg/dL (ref >39)
LDL Chol Calc (NIH): 65 mg/dL (ref 0–99)
Triglycerides: 70 mg/dL (ref 0–149)
VLDL Cholesterol Cal: 14 mg/dL (ref 5–40)

## 2022-02-19 ENCOUNTER — Encounter: Payer: Self-pay | Admitting: Nurse Practitioner

## 2022-02-19 ENCOUNTER — Ambulatory Visit (INDEPENDENT_AMBULATORY_CARE_PROVIDER_SITE_OTHER): Payer: Medicare Other | Admitting: Nurse Practitioner

## 2022-02-19 VITALS — BP 101/61 | HR 67 | Temp 97.8°F | Resp 20 | Ht 66.0 in | Wt 140.0 lb

## 2022-02-19 DIAGNOSIS — E034 Atrophy of thyroid (acquired): Secondary | ICD-10-CM

## 2022-02-19 DIAGNOSIS — K5730 Diverticulosis of large intestine without perforation or abscess without bleeding: Secondary | ICD-10-CM | POA: Diagnosis not present

## 2022-02-19 DIAGNOSIS — I6930 Unspecified sequelae of cerebral infarction: Secondary | ICD-10-CM

## 2022-02-19 DIAGNOSIS — G10 Huntington's disease: Secondary | ICD-10-CM

## 2022-02-19 DIAGNOSIS — Z6824 Body mass index (BMI) 24.0-24.9, adult: Secondary | ICD-10-CM

## 2022-02-19 DIAGNOSIS — R269 Unspecified abnormalities of gait and mobility: Secondary | ICD-10-CM

## 2022-02-19 DIAGNOSIS — I480 Paroxysmal atrial fibrillation: Secondary | ICD-10-CM

## 2022-02-19 DIAGNOSIS — I4821 Permanent atrial fibrillation: Secondary | ICD-10-CM

## 2022-02-19 DIAGNOSIS — Z23 Encounter for immunization: Secondary | ICD-10-CM

## 2022-02-19 DIAGNOSIS — E785 Hyperlipidemia, unspecified: Secondary | ICD-10-CM

## 2022-02-19 DIAGNOSIS — K219 Gastro-esophageal reflux disease without esophagitis: Secondary | ICD-10-CM

## 2022-02-19 DIAGNOSIS — I10 Essential (primary) hypertension: Secondary | ICD-10-CM

## 2022-02-19 DIAGNOSIS — N400 Enlarged prostate without lower urinary tract symptoms: Secondary | ICD-10-CM

## 2022-02-19 DIAGNOSIS — R413 Other amnesia: Secondary | ICD-10-CM

## 2022-02-19 DIAGNOSIS — I639 Cerebral infarction, unspecified: Secondary | ICD-10-CM

## 2022-02-19 DIAGNOSIS — F3342 Major depressive disorder, recurrent, in full remission: Secondary | ICD-10-CM

## 2022-02-19 MED ORDER — LEVOTHYROXINE SODIUM 50 MCG PO TABS: 50.0000 ug | ORAL_TABLET | Freq: Every day | ORAL | 1 refills | Status: DC

## 2022-02-19 MED ORDER — OMEPRAZOLE 20 MG PO CPDR: 20.0000 mg | DELAYED_RELEASE_CAPSULE | Freq: Every day | ORAL | 1 refills | Status: DC

## 2022-02-19 MED ORDER — ATORVASTATIN CALCIUM 80 MG PO TABS: 80.0000 mg | ORAL_TABLET | Freq: Every day | ORAL | 1 refills | Status: DC

## 2022-02-19 MED ORDER — LISINOPRIL-HYDROCHLOROTHIAZIDE 20-12.5 MG PO TABS: 1.0000 | ORAL_TABLET | Freq: Every day | ORAL | 1 refills | Status: DC

## 2022-02-19 MED ORDER — METOPROLOL SUCCINATE ER 25 MG PO TB24: 25.0000 mg | ORAL_TABLET | Freq: Every day | ORAL | 1 refills | Status: DC

## 2022-02-19 MED ORDER — HALOPERIDOL 1 MG PO TABS: 1.5000 mg | ORAL_TABLET | Freq: Two times a day (BID) | ORAL | 1 refills | Status: DC

## 2022-02-19 MED ORDER — CITALOPRAM HYDROBROMIDE 20 MG PO TABS: 20.0000 mg | ORAL_TABLET | Freq: Every day | ORAL | 1 refills | Status: DC

## 2022-02-19 NOTE — Patient Instructions (Signed)

## 2022-02-19 NOTE — Progress Notes (Signed)
Subjective:    Patient ID: Sergio Baker, male    DOB: December 01, 1934, 87 y.o.   MRN: [id number]   Chief Complaint: medical management of chronic issues     HPI:  Sergio Baker is a 87 y.o. who identifies as a male who was assigned male at birth.   Social history: Lives with: wife Work history: retired   Scientist, forensic in today for follow up of the following chronic medical issues:  1. Essential hypertension No c/o chest pain ,SOB or headache. Does not check blood pressure at home. BP Readings from Last 3 Encounters:  12/15/21 (!) 149/90  11/14/21 (!) 80/46  08/12/21 130/72     2. Hyperlipidemia with target LDL less than 100 Eats whatever his wife fixes him. Is not able to do much exercise  3. Ischemic stroke ([place]) No permanent effects.  4. Permanent atrial fibrillation (HCC) Denies palpitations or heart racing. Is on eliquis with no bleeding issues  5. Gastroesophageal reflux disease, unspecified whether esophagitis present Is on omperazole daily and is doing well.  6. Hypothyroidism due to acquired atrophy of thyroid No issues that he is aware of. Lab Results  Component Value Date   TSH 3.920 08/06/2021     7. Huntington's chorea ([place]) 8. Abnormality of gait Gait is wobbly. He does not use any walking assistance. He denies any falls.  9. Benign prostatic hyperplasia without lower urinary tract symptoms No voiding issues  10. Memory deficits Wife says seems to be maintainng. He does repeat hisself a lot though.  11. Recurrent major depressive disorder, in full remission ([place]) Is on celexa and is doing well.    02/19/2022    2:10 PM 12/15/2021   11:29 AM 11/14/2021    2:13 PM  Depression screen PHQ 2/9  Decreased Interest 0 0 0  Down, Depressed, Hopeless 0 1 0  PHQ - 2 Score 0 1 0  Altered sleeping 0 3 0  Tired, decreased energy 0 3 0  Change in appetite 0 0 0  Feeling bad or failure about yourself  0 0 0  Trouble concentrating 0 1 0  Moving slowly or  fidgety/restless 0 1 0  Suicidal thoughts 0 0 0  PHQ-9 Score 0 9 0  Difficult doing work/chores Not difficult at all Somewhat difficult Not difficult at all     12. Diverticulosis of large intestine without hemorrhage No recent flare ups. No c/o abdominal pain  13. BMI 24.0-24.9, adult No recent weight changes   New complaints: None   No Known Allergies Outpatient Encounter Medications as of 02/19/2022  Medication Sig   acetaminophen (TYLENOL) 650 MG CR tablet Take 1,300 mg by mouth daily as needed for pain.   Aloe Vera 25 MG CAPS Take 25 mg by mouth daily.   apixaban (ELIQUIS) 5 MG TABS tablet TAKE ONE (1) TABLET BY MOUTH TWO (2) TIMES DAILY   atorvastatin (LIPITOR) 80 MG tablet Take 1 tablet (80 mg total) by mouth at bedtime.   CALCIUM CITRATE PO Take 1 tablet by mouth daily.   Cholecalciferol (VITAMIN D3) 2000 UNITS capsule Take 2,000 Units by mouth daily.   citalopram (CELEXA) 20 MG tablet Take 1 tablet (20 mg total) by mouth daily.   Glucosamine-Chondroit-Vit C-Mn (GLUCOSAMINE CHONDR 1500 COMPLX PO) Take 1 tablet by mouth daily.   haloperidol (HALDOL) 1 MG tablet Take 1.5 tablets (1.5 mg total) by mouth 2 (two) times daily.   levothyroxine (SYNTHROID) 50 MCG tablet Take 1 tablet (50 mcg  total) by mouth daily before breakfast.   lisinopril-hydrochlorothiazide (ZESTORETIC) 20-12.5 MG tablet Take 1 tablet by mouth daily.   Methylsulfonylmethane (MSM) 1000 MG CAPS Take 1,000 mg by mouth daily. (Patient not taking: Reported on 12/15/2021)   metoprolol succinate (TOPROL-XL) 25 MG 24 hr tablet Take 1 tablet (25 mg total) by mouth daily.   Multiple Vitamin (MULTIVITAMIN WITH MINERALS) TABS Take 1 tablet by mouth daily. For Men 50+   mupirocin ointment (BACTROBAN) 2 %    Omega-3 Fatty Acids (FISH OIL PO) Take 1 tablet by mouth daily.   omeprazole (PRILOSEC) 20 MG capsule Take 1 capsule (20 mg total) by mouth daily.   potassium chloride (KLOR-CON) 10 MEQ tablet TAKE ONE (1) TABLET BY  MOUTH TWO (2) TIMES DAILY   No facility-administered encounter medications on file as of 02/19/2022.    Past Surgical History:  Procedure Laterality Date   APPENDECTOMY     BIOPSY  07/19/2017   Procedure: BIOPSY;  Surgeon: Daneil Dolin, MD;  [facility];  Service: Endoscopy;;  colon   BRAIN SURGERY     CATARACT EXTRACTION W/PHACO Left 05/07/2017   Procedure: CATARACT EXTRACTION PHACO AND INTRAOCULAR LENS PLACEMENT ([place]);  Surgeon: Baruch Goldmann, MD;  Location: [facility];  Service: Ophthalmology;  Laterality: Left;  CDE: 10.49   CATARACT EXTRACTION W/PHACO Right 06/04/2017   Procedure: CATARACT EXTRACTION PHACO AND INTRAOCULAR LENS PLACEMENT RIGHT EYE;  Surgeon: Baruch Goldmann, MD;  Location: [facility];  Service: Ophthalmology;  Laterality: Right;  CDE: 7.07   COLONOSCOPY  02/2010   patchy erythema, minimal granularity in distal rectum, left-sided diverticula, ileal diverticulum.  Bx benign. next TCS 02/2012   COLONOSCOPY  03/24/2012   Dr. Gala Romney- inflammatory changes of the rectum and colon consistent with history of U/C. bx= benign colonic mucosa   COLONOSCOPY N/A 10/05/2014   RMR: abnormal rectal and sigmoid mucosa as described above status post segmental biopsy. active colitis sigmoid colon.   COLONOSCOPY WITH PROPOFOL N/A 07/19/2017   Procedure: COLONOSCOPY WITH PROPOFOL;  Surgeon: Daneil Dolin, MD;  [facility];  Service: Endoscopy;  Laterality: N/A;  7:30am   cyst removed from urinary bladder     ESOPHAGOGASTRODUODENOSCOPY  04/2008   schatzki ring, s/p ED, hh   LUMBAR FUSION     SHOULDER ARTHROSCOPY W/ ROTATOR CUFF REPAIR Bilateral    TONSILLECTOMY      Family History  Problem Relation Age of Onset   Lung cancer Mother 24       deceased   Other Brother        Posttraumatic stress disorder   Cancer Brother        skin cancer   Cancer Sister        thyroid   Arthritis Sister    SIDS Brother    Huntington's disease Daughter    Colon cancer Neg Hx        Controlled substance contract: n/a     Review of Systems  Constitutional:  Negative for diaphoresis.  Eyes:  Negative for pain.  Respiratory:  Negative for shortness of breath.   Cardiovascular:  Negative for chest pain, palpitations and leg swelling.  Gastrointestinal:  Negative for abdominal pain.  Endocrine: Negative for polydipsia.  Musculoskeletal:  Positive for gait problem.  Skin:  Negative for rash.  Neurological:  Negative for dizziness, weakness and headaches.  Hematological:  Does not bruise/bleed easily.  All other systems reviewed and are negative.      Objective:  Physical Exam Vitals and nursing note reviewed.  Constitutional:      Appearance: Normal appearance. He is well-developed.  HENT:     Head: Normocephalic.     Nose: Nose normal.     Mouth/Throat:     Mouth: Mucous membranes are moist.     Pharynx: Oropharynx is clear.  Eyes:     Pupils: Pupils are equal, round, and reactive to light.  Neck:     Thyroid: No thyroid mass or thyromegaly.     Vascular: No carotid bruit or JVD.     Trachea: Phonation normal.  Cardiovascular:     Rate and Rhythm: Normal rate. Rhythm irregular.  Pulmonary:     Effort: Pulmonary effort is normal. No respiratory distress.     Breath sounds: Normal breath sounds.  Abdominal:     General: Bowel sounds are normal.     Palpations: Abdomen is soft.     Tenderness: There is no abdominal tenderness.  Musculoskeletal:        General: Normal range of motion.     Cervical back: Normal range of motion and neck supple.  Lymphadenopathy:     Cervical: No cervical adenopathy.  Skin:    General: Skin is warm and dry.  Neurological:     Mental Status: He is alert and oriented to person, place, and time.  Psychiatric:        Behavior: Behavior normal.        Thought Content: Thought content normal.        Judgment: Judgment normal.     BP 101/61   Pulse 67   Temp 97.8 F (36.6 C) (Temporal)   Resp 20   Ht 5'  6" (1.676 m)   Wt 140 lb (63.5 kg)   SpO2 95%   BMI 22.60 kg/m        Assessment & Plan:   Sergio Baker comes in today with chief complaint of Medical Management of Chronic Issues   Diagnosis and orders addressed:  1. Essential hypertension Low sodium diet - lisinopril-hydrochlorothiazide (ZESTORETIC) 20-12.5 MG tablet; Take 1 tablet by mouth daily.  Dispense: 90 tablet; Refill: 1  2. Hyperlipidemia with target LDL less than 100 Low fat diet - atorvastatin (LIPITOR) 80 MG tablet; Take 1 tablet (80 mg total) by mouth at bedtime.  Dispense: 90 tablet; Refill: 1  3. Ischemic stroke ([place])  4. Permanent atrial fibrillation (HCC) Avoid caffeie continue eliquis as prescribed  5. Gastroesophageal reflux disease, unspecified whether esophagitis present Avoid spicy foods Do not eat 2 hours prior to bedtime - omeprazole (PRILOSEC) 20 MG capsule; Take 1 capsule (20 mg total) by mouth daily.  Dispense: 90 capsule; Refill: 1  6. Hypothyroidism due to acquired atrophy of thyroid - levothyroxine (SYNTHROID) 50 MCG tablet; Take 1 tablet (50 mcg total) by mouth daily before breakfast.  Dispense: 90 tablet; Refill: 1  7. Huntington's chorea (HCC) - haloperidol (HALDOL) 1 MG tablet; Take 1.5 tablets (1.5 mg total) by mouth 2 (two) times daily.  Dispense: 270 tablet; Refill: 1  8. Abnormality of gait Fall prevention  9. Benign prostatic hyperplasia without lower urinary tract symptoms Report any voiding issues  10. Memory deficits Orient frequently throughout the day  11. Recurrent major depressive disorder, in full remission ([place]) - citalopram (CELEXA) 20 MG tablet; Take 1 tablet (20 mg total) by mouth daily.  Dispense: 90 tablet; Refill: 1  12. Diverticulosis of large intestine without hemorrhage Diet to prevent flare up  13. BMI  24.0-24.9, adult  14. Paroxysmal atrial fibrillation (HCC) - metoprolol succinate (TOPROL-XL) 25 MG 24 hr tablet; Take 1 tablet (25 mg total) by  mouth daily.  Dispense: 90 tablet; Refill: 1   Labs pending Health Maintenance reviewed Diet and exercise encouraged  Follow up plan: 3 months   Mary-Margaret Hassell Done, FNP

## 2022-02-19 NOTE — Addendum Note (Signed)
Addended by: Rolena Infante on: 02/19/2022 04:42 PM   Modules accepted: Orders

## 2022-03-09 ENCOUNTER — Ambulatory Visit: Payer: Self-pay | Admitting: *Deleted

## 2022-03-09 DIAGNOSIS — G10 Huntington's disease: Secondary | ICD-10-CM

## 2022-03-09 NOTE — Patient Instructions (Signed)
Sergio Baker  I have previously worked with you through the Chronic Care Management Program at [facility]. Due to program changes I am removing myself from your care team because you've either met our goals, your conditions are stable and no longer require care management, or we haven't engaged within the past 6 months. If you are currently active with another CCM Team Member, you will remain active with them unless they reach out to you with additional information. If you feel that you need RN Care Management services in the future, please talk with your primary care provider to discuss re-engagement with the RN Care Manager that will be assigned to [facility]. This does not affect your status as a patient at [facility].   Thank you for allowing me to participate in your your healthcare journey.  Chong Sicilian, BSN, RN-BC Embedded Chronic Care Manager [facility] / [place] Management Direct Dial: [phone]

## 2022-03-09 NOTE — Chronic Care Management (AMB) (Signed)
  Chronic Care Management   Note  03/09/2022 Name: Sergio Baker MRN: [id number] DOB: [birth year]   Patient has either met RN Care Management goals, is stable from RN Care Management perspective, or has not recently engaged with the RN Care Manager. I am removing RN Care Manager from Care Team and closing [facility]. If patient is currently engaged with another CCM team member I will forward this encounter to inform them of my case closure. Patient may be eligible for re-engagement with RN Care Manager in the future if necessary and can discuss this with their PCP.  Chong Sicilian, BSN, RN-BC Embedded Chronic Care Manager [facility] / [place] [facility] Direct Dial: [phone]

## 2022-03-19 ENCOUNTER — Other Ambulatory Visit: Payer: Self-pay | Admitting: Nurse Practitioner

## 2022-03-23 ENCOUNTER — Ambulatory Visit: Payer: Medicare Other | Admitting: Cardiology

## 2022-03-25 ENCOUNTER — Telehealth: Payer: Self-pay | Admitting: Cardiology

## 2022-03-25 MED ORDER — APIXABAN 5 MG PO TABS: 5.0000 mg | ORAL_TABLET | Freq: Two times a day (BID) | ORAL | 0 refills | Status: DC

## 2022-03-25 NOTE — Telephone Encounter (Signed)
Reports spending at least $300 of $1130 3% total. Advised to bring receipts for him and his wife so that we could fax to [facility].  Verbalized understanding.

## 2022-03-25 NOTE — Telephone Encounter (Signed)
Patient calling the office for samples of medication:   1.  What medication and dosage are you requesting samples for? apixaban (ELIQUIS) 5 MG TABS tablet  2.  Are you currently out of this medication? Not currently,  but pt's wife states that they will be out soon. She also states they have reached the donut hole with their insurance and would like assistance with this until their insurance takes back over.

## 2022-03-25 NOTE — Telephone Encounter (Signed)
Sample request for Eliquis received. Indication: Atrial FIb Last office visit: 09/23/22  Lawanda Cousins NP  (OV on 09/23/22) Scr: 0.86 on 02/12/22 Age: 87 Weight: 69.9kg  Based on above findings Eliquis 58m twice daily is the appropriate dose.  OK to give samples if available.

## 2022-04-07 ENCOUNTER — Telehealth: Payer: Self-pay | Admitting: *Deleted

## 2022-04-07 NOTE — Telephone Encounter (Signed)
[facility] contacted to get 3% out of pocket expense and was informed that patient did not qualify based on 1040 tax return for 2022 that showed income over $65,900 per year which disqualifies for the program.  Patient informed and verbalized understanding.

## 2022-05-20 ENCOUNTER — Telehealth: Payer: Self-pay

## 2022-05-20 DIAGNOSIS — R42 Dizziness and giddiness: Secondary | ICD-10-CM | POA: Diagnosis not present

## 2022-05-20 DIAGNOSIS — I4891 Unspecified atrial fibrillation: Secondary | ICD-10-CM | POA: Diagnosis not present

## 2022-05-20 DIAGNOSIS — Z7982 Long term (current) use of aspirin: Secondary | ICD-10-CM | POA: Diagnosis not present

## 2022-05-20 DIAGNOSIS — R9431 Abnormal electrocardiogram [ECG] [EKG]: Secondary | ICD-10-CM | POA: Diagnosis not present

## 2022-05-20 DIAGNOSIS — Z87891 Personal history of nicotine dependence: Secondary | ICD-10-CM | POA: Diagnosis not present

## 2022-05-20 NOTE — Telephone Encounter (Signed)
Spouse called and worried patient had a stroke.  States he broke out into a sweat and held his head.  He has had a stroke before.  Spoke with patient and wife and he is going to go to ER and verbalized understanding.

## 2022-05-21 ENCOUNTER — Other Ambulatory Visit: Payer: Self-pay

## 2022-05-21 ENCOUNTER — Ambulatory Visit (INDEPENDENT_AMBULATORY_CARE_PROVIDER_SITE_OTHER): Payer: Medicare Other

## 2022-05-21 DIAGNOSIS — Z23 Encounter for immunization: Secondary | ICD-10-CM | POA: Diagnosis not present

## 2022-05-21 DIAGNOSIS — R413 Other amnesia: Secondary | ICD-10-CM

## 2022-05-25 ENCOUNTER — Ambulatory Visit: Payer: Medicare Other | Admitting: Neurology

## 2022-05-25 VITALS — BP 111/69 | HR 99 | Ht 66.0 in | Wt 140.2 lb

## 2022-05-25 DIAGNOSIS — I6930 Unspecified sequelae of cerebral infarction: Secondary | ICD-10-CM | POA: Insufficient documentation

## 2022-05-25 DIAGNOSIS — R410 Disorientation, unspecified: Secondary | ICD-10-CM | POA: Diagnosis not present

## 2022-05-25 DIAGNOSIS — G10 Huntington's disease: Secondary | ICD-10-CM | POA: Insufficient documentation

## 2022-05-25 DIAGNOSIS — I639 Cerebral infarction, unspecified: Secondary | ICD-10-CM

## 2022-05-25 DIAGNOSIS — Z8673 Personal history of transient ischemic attack (TIA), and cerebral infarction without residual deficits: Secondary | ICD-10-CM | POA: Insufficient documentation

## 2022-05-25 MED ORDER — LAMOTRIGINE 100 MG PO TABS: 100.0000 mg | ORAL_TABLET | Freq: Two times a day (BID) | ORAL | 11 refills | Status: DC

## 2022-05-25 MED ORDER — DEUTETRABENAZINE 6 MG PO TABS: 12.0000 mg | ORAL_TABLET | Freq: Two times a day (BID) | ORAL | 11 refills | Status: DC

## 2022-05-25 MED ORDER — LAMOTRIGINE 25 MG PO TABS: ORAL_TABLET | ORAL | 0 refills | Status: DC

## 2022-05-25 NOTE — Progress Notes (Signed)
Chief Complaint  Patient presents with   New Patient (Initial Visit)    Rm 15 with wife. Previously seen by Dr. Jannifer Franklin here for consult on "spells" that consist of dizziness, sweating, and bran fog. Wife also reports grabbled speech, off balance ( hx of Huntington's Disease) Most ER visit (05/20/2022 records in care everywhere)       ASSESSMENT AND PLAN  Sergio Baker is a 87 y.o. male   Huntington's disease  Strong family history, daughter died of Huntington's disease at age 35, mother also had Huntington's disease phenotype, he never had genetic testing, slow worsening, chorea movement, gait abnormality  Has been treated with Haldol 1.5 mg twice a day since 2014, noticed mild, protrusion movement, may consider Deutetrabenazine 6 mg twice a day or similar, family concerned about the high cost of medications, prescription was sent to pharmacy to check the benefit  History of stroke involving right frontal lobe, Recurrent episode of sudden onset sweaty confusion,  Suggestive of partial seizure, starting lamotrigine titrating to 100 mg twice a day  EEG  Return to clinic with nurse practitioner in 3 months  DIAGNOSTIC DATA (LABS, IMAGING, TESTING) - I reviewed patient records, labs, notes, testing and imaging myself where available.  Lab in Oct 2023: LDL 65, CMP, K 5.3,  creat 0.77, CBC Hg 13.9.  MEDICAL HISTORY:  Sergio Baker, is a 87 year old male, accompanied by his wife, follow-up for Huntington's disease, worsening gait abnormality,  I reviewed and summarized the referring note.PMHx. Atrial fibrillation, on eliquis HLD Depression HTN  He was seen by Dr. Jannifer Franklin since 2014, strong family history of Huntington's disease, 108 year old daughter just died of Huntington's disease, mother also had similar abnormal movement  More than 10 years ago, he developed slow worsening choreoathetotic movement, gradually getting worse, also developed gait abnormality, mild memory loss,  but no significant mood disorder, wife reported that he is very pleasant to be around, he was assumed to have diagnosis of Huntington's disease, but never had genetic testing  Personally reviewed MRI of the brain in March 2018, moderate atrophy, does have caved in caudate head, Repeat MRI of the brain at [facility] on October 4 for evaluation of dizziness, reported remote infarction in the right frontal lobe cortex left basal ganglion, uses MRI of the brain since 2018 but no acute process  He suffered stroke in 2022, with worsening chorea athetotic movement, mild residual left-sided weakness, gait abnormality,  Reviewed multiple evaluation by Dr. Jannifer Franklin in the past, has been treated with Haldol titrating dose, currently taking 1.5 mg twice a day for many years, was not sure about the benefit, I did notice that he has a tendency for tongue protrusion, chewing movement.  He presented with 3 episode of sudden onset dizziness since July 2023, wife described 1 episode, he was sitting on the sofa, suddenly felt dizzy, sweaty, confused, while wife attended him, he was gazing into space, not able to focus, lasting for few minutes, feels tired afterwards, there is no tonic-clonic movement I  PHYSICAL EXAM:   Vitals:   05/25/22 0939  BP: 111/69  Pulse: 99  Weight: 140 lb 4 oz (63.6 kg)  Height: 5' 6"  (1.676 m)   Not recorded     Body mass index is 22.64 kg/m.  PHYSICAL EXAMNIATION:  Gen: NAD, conversant, well nourised, well groomed                     Cardiovascular: Regular rate rhythm, no peripheral edema,  warm, nontender. Eyes: Conjunctivae clear without exudates or hemorrhage Neck: Supple, no carotid bruits. Pulmonary: Clear to auscultation bilaterally   NEUROLOGICAL EXAM:  MENTAL STATUS: Speech/cognition: Mild slurred speech, cooperative on examination, constant chorea movement, intermittent tongue protrusion  CRANIAL NERVES: CN II: Visual fields are full to confrontation. Pupils are  round equal and briskly reactive to light. CN III, IV, VI: extraocular movement are normal. No ptosis. CN V: Facial sensation is intact to light touch CN VII: Face is symmetric with normal eye closure  CN VIII: Hearing is normal to causal conversation. CN IX, X: Phonation is normal. CN XI: Head turning and shoulder shrug are intact  MOTOR: Upper extremity motor examination is limited due to right shoulder pain, fixation of left arm on rapid rotating movement,  REFLEXES: Reflexes are 2+ and symmetric at the biceps, triceps, knees, and ankles. Plantar responses are flexor.  SENSORY: Intact to light touch, pinprick and vibratory sensation are intact in fingers and toes.  COORDINATION: mild truncal ataxia, mild finger-to-nose and heel-to-shin dysmetria,  GAIT/STANCE: Rely on his walker to get up from seated position, unsteady, wide-based,  REVIEW OF SYSTEMS:  Full 14 system review of systems performed and notable only for as above All other review of systems were negative.   ALLERGIES: No Known Allergies  HOME MEDICATIONS: Current Outpatient Medications  Medication Sig Dispense Refill   acetaminophen (TYLENOL) 650 MG CR tablet Take 1,300 mg by mouth daily as needed for pain.     Aloe Vera 25 MG CAPS Take 25 mg by mouth daily.     apixaban (ELIQUIS) 5 MG TABS tablet TAKE ONE (1) TABLET BY MOUTH TWO (2) TIMES DAILY 180 tablet 3   atorvastatin (LIPITOR) 80 MG tablet Take 1 tablet (80 mg total) by mouth at bedtime. 90 tablet 1   Cholecalciferol (VITAMIN D3) 2000 UNITS capsule Take 2,000 Units by mouth daily.     citalopram (CELEXA) 20 MG tablet Take 1 tablet (20 mg total) by mouth daily. 90 tablet 1   Glucosamine-Chondroit-Vit C-Mn (GLUCOSAMINE CHONDR 1500 COMPLX PO) Take 1 tablet by mouth daily.     haloperidol (HALDOL) 1 MG tablet Take 1.5 tablets (1.5 mg total) by mouth 2 (two) times daily. 270 tablet 1   levothyroxine (SYNTHROID) 50 MCG tablet Take 1 tablet (50 mcg total) by mouth  daily before breakfast. 90 tablet 1   lisinopril-hydrochlorothiazide (ZESTORETIC) 20-12.5 MG tablet Take 1 tablet by mouth daily. 90 tablet 1   metoprolol succinate (TOPROL-XL) 25 MG 24 hr tablet Take 1 tablet (25 mg total) by mouth daily. 90 tablet 1   Multiple Vitamin (MULTIVITAMIN WITH MINERALS) TABS Take 1 tablet by mouth daily. For Men 50+     Omega-3 Fatty Acids (FISH OIL PO) Take 1 tablet by mouth daily.     omeprazole (PRILOSEC) 20 MG capsule Take 1 capsule (20 mg total) by mouth daily. 90 capsule 1   potassium chloride (KLOR-CON) 10 MEQ tablet TAKE ONE (1) TABLET BY MOUTH TWO (2) TIMES DAILY 180 tablet 1   No current facility-administered medications for this visit.    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[place])    Diagnosed in the 1980s   Schatzki's ring    Stroke (cerebrum) ([place])    Ulcerative colitis ([place])     PAST SURGICAL HISTORY: Past Surgical History:  Procedure Laterality Date   APPENDECTOMY     BIOPSY  07/19/2017   Procedure: BIOPSY;  Surgeon: Daneil Dolin, MD;  [facility];  Service: Endoscopy;;  colon   BRAIN SURGERY     CATARACT EXTRACTION W/PHACO Left 05/07/2017   Procedure: CATARACT EXTRACTION PHACO AND INTRAOCULAR LENS PLACEMENT ([place]);  Surgeon: Baruch Goldmann, MD;  Location: [facility];  Service: Ophthalmology;  Laterality: Left;  CDE: 10.49   CATARACT EXTRACTION W/PHACO Right 06/04/2017   Procedure: CATARACT EXTRACTION PHACO AND INTRAOCULAR LENS PLACEMENT RIGHT EYE;  Surgeon: Baruch Goldmann, MD;  Location: [facility];  Service: Ophthalmology;  Laterality: Right;  CDE: 7.07    COLONOSCOPY  02/2010   patchy erythema, minimal granularity in distal rectum, left-sided diverticula, ileal diverticulum.  Bx benign. next TCS 02/2012   COLONOSCOPY  03/24/2012   Dr. Gala Romney- inflammatory changes of the rectum and colon consistent with history of U/C. bx= benign colonic mucosa   COLONOSCOPY N/A 10/05/2014   RMR: abnormal rectal and sigmoid mucosa as described above status post segmental biopsy. active colitis sigmoid colon.   COLONOSCOPY WITH PROPOFOL N/A 07/19/2017   Procedure: COLONOSCOPY WITH PROPOFOL;  Surgeon: Daneil Dolin, MD;  Location[facility];  Service: Endoscopy;  Laterality: N/A;  7:30am   cyst removed from urinary bladder     ESOPHAGOGASTRODUODENOSCOPY  04/2008   schatzki ring, s/p ED, hh   LUMBAR FUSION     SHOULDER ARTHROSCOPY W/ ROTATOR CUFF REPAIR Bilateral    TONSILLECTOMY      FAMILY HISTORY: Family History  Problem Relation Age of Onset   Lung cancer Mother 60       deceased   Other Brother        Posttraumatic stress disorder   Cancer Brother        skin cancer   Cancer Sister        thyroid   Arthritis Sister    SIDS Brother    Huntington's disease Daughter    Colon cancer Neg Hx     SOCIAL HISTORY: Social History   Socioeconomic History   Marital status: Married    Spouse name: [name]   Number of children: 1   Years of education: 12th   Highest education level: 12th grade  Occupational History   Occupation: retired Furniture conservator/restorer   Occupation: works as Administrator, arts at [employer]  Tobacco Use   Smoking status: Former    Packs/day: 0.25    Years: 8.00    Total pack years: 2.00    Types: Cigarettes    Quit date: 05/03/1973    Years since quitting: 49.0   Smokeless tobacco: Never   Tobacco comments:    Quit 1970's   Vaping Use   Vaping Use: Never used  Substance and Sexual Activity   Alcohol use: No    Alcohol/week: 0.0 standard drinks of alcohol   Drug use: No   Sexual activity: Not on file  Other Topics Concern   Not on file   Social History Narrative   Patient lives at home with his wife.    Social Determinants of Health   Financial Resource Strain: Medium Risk (12/15/2021)   Overall Financial Resource Strain (CARDIA)    Difficulty of  Paying Living Expenses: Somewhat hard  Food Insecurity: No Food Insecurity (12/15/2021)   Hunger Vital Sign    Worried About Running Out of Food in the Last Year: Never true    Ran Out of Food in the Last Year: Never true  Transportation Needs: No Transportation Needs (12/15/2021)   PRAPARE - Hydrologist (Medical): No    Lack of Transportation (Non-Medical): No  Physical Activity: Insufficiently Active (12/15/2021)   Exercise Vital Sign    Days of Exercise per Week: 7 days    Minutes of Exercise per Session: 10 min  Stress: No Stress Concern Present (12/15/2021)   [place]    Feeling of Stress : Only a little  Social Connections: Socially Integrated (12/15/2021)   Social Connection and Isolation Panel [NHANES]    Frequency of Communication with Friends and Family: More than three times a week    Frequency of Social Gatherings with Friends and Family: More than three times a week    Attends Religious Services: More than 4 times per year    Active Member of [employer] or Organizations: Yes    Attends Music therapist: More than 4 times per year    Marital Status: Married  Human resources officer Violence: Not At Risk (12/15/2021)   Humiliation, Afraid, Rape, and Kick questionnaire    Fear of Current or Ex-Partner: No    Emotionally Abused: No    Physically Abused: No    Sexually Abused: No    Total time spent reviewing the chart, obtaining history, examined patient, ordering tests, documentation, consultations and family, care coordination was  73 minutes    Marcial Pacas, M.D. Ph.D.  [facility] [address] [place], [place] [address] Ph: [phone] Fax: [phone]  CC:  Chevis Pretty, [place],  [place] [phone]  Chevis Pretty, [place]

## 2022-05-26 DIAGNOSIS — M25569 Pain in unspecified knee: Secondary | ICD-10-CM | POA: Diagnosis not present

## 2022-05-26 DIAGNOSIS — M25561 Pain in right knee: Secondary | ICD-10-CM | POA: Diagnosis not present

## 2022-05-27 ENCOUNTER — Telehealth: Payer: Self-pay

## 2022-05-27 NOTE — Telephone Encounter (Signed)
Pa for Austedo 6 MG Request Reference Number: [id number]. AUSTEDO TAB 6MG is approved through 08/17/2023. Your patient may now fill this prescription and it will be covered.

## 2022-05-29 DIAGNOSIS — M25561 Pain in right knee: Secondary | ICD-10-CM | POA: Diagnosis not present

## 2022-05-30 ENCOUNTER — Encounter: Payer: Self-pay | Admitting: Neurology

## 2022-06-01 ENCOUNTER — Ambulatory Visit: Payer: Medicare Other | Admitting: Nurse Practitioner

## 2022-06-03 ENCOUNTER — Other Ambulatory Visit: Payer: Medicare Other | Admitting: *Deleted

## 2022-06-04 ENCOUNTER — Other Ambulatory Visit: Payer: Medicare Other

## 2022-06-04 ENCOUNTER — Telehealth: Payer: Self-pay | Admitting: Neurology

## 2022-06-04 DIAGNOSIS — N400 Enlarged prostate without lower urinary tract symptoms: Secondary | ICD-10-CM

## 2022-06-04 DIAGNOSIS — R7309 Other abnormal glucose: Secondary | ICD-10-CM

## 2022-06-04 DIAGNOSIS — I10 Essential (primary) hypertension: Secondary | ICD-10-CM | POA: Diagnosis not present

## 2022-06-04 DIAGNOSIS — E785 Hyperlipidemia, unspecified: Secondary | ICD-10-CM

## 2022-06-04 DIAGNOSIS — E034 Atrophy of thyroid (acquired): Secondary | ICD-10-CM

## 2022-06-04 LAB — BAYER DCA HB A1C WAIVED: HB A1C (BAYER DCA - WAIVED): 6.3 % — ABNORMAL HIGH (ref 4.8–5.6)

## 2022-06-04 NOTE — Telephone Encounter (Signed)
Pt wife is calling. Stated Pt is taking lamoTRIgine (LAMICTAL) 25 MG tablet and it's making him really dizzy. She is asking for a return call from the nurse.

## 2022-06-04 NOTE — Telephone Encounter (Signed)
I called pt's wife back she reports he started Lamotrigine 2 tabs twice per day on 06/02/2022. Since starting this dosage increased dizziness has been noted.   I spoke with Dr. Krista Blue she would like to decrease the Haldol to 1 tab per day for 7 day's and then d/c. She verbalized understanding and will keep up to date on how he fares.  Reports she checked on the Austedo rx and the cost is 4000 $. I advised I would check on potential assistance programs for him.

## 2022-06-05 LAB — CMP14+EGFR
ALT: 25 IU/L (ref 0–44)
AST: 27 IU/L (ref 0–40)
Albumin/Globulin Ratio: 1.7 (ref 1.2–2.2)
Albumin: 4.2 g/dL (ref 3.7–4.7)
Alkaline Phosphatase: 81 IU/L (ref 44–121)
BUN/Creatinine Ratio: 17 (ref 10–24)
BUN: 16 mg/dL (ref 8–27)
Bilirubin Total: 0.9 mg/dL (ref 0.0–1.2)
CO2: 29 mmol/L (ref 20–29)
Calcium: 8.9 mg/dL (ref 8.6–10.2)
Chloride: 99 mmol/L (ref 96–106)
Creatinine, Ser: 0.93 mg/dL (ref 0.76–1.27)
Globulin, Total: 2.5 g/dL (ref 1.5–4.5)
Glucose: 104 mg/dL — ABNORMAL HIGH (ref 70–99)
Potassium: 4.7 mmol/L (ref 3.5–5.2)
Sodium: 142 mmol/L (ref 134–144)
Total Protein: 6.7 g/dL (ref 6.0–8.5)
eGFR: 80 mL/min/{1.73_m2} (ref >59)

## 2022-06-05 LAB — THYROID PANEL WITH TSH
Free Thyroxine Index: 2.2 (ref 1.2–4.9)
T3 Uptake Ratio: 32 % (ref 24–39)
T4, Total: 7 ug/dL (ref 4.5–12.0)
TSH: 3.45 u[IU]/mL (ref 0.450–4.500)

## 2022-06-05 LAB — CBC WITH DIFFERENTIAL/PLATELET
Basophils Absolute: 0.1 10*3/uL (ref 0.0–0.2)
Basos: 1 %
EOS (ABSOLUTE): 0.2 10*3/uL (ref 0.0–0.4)
Eos: 2 %
Hematocrit: 42.7 % (ref 37.5–51.0)
Hemoglobin: 14 g/dL (ref 13.0–17.7)
Immature Grans (Abs): 0 10*3/uL (ref 0.0–0.1)
Immature Granulocytes: 0 %
Lymphocytes Absolute: 1.6 10*3/uL (ref 0.7–3.1)
Lymphs: 19 %
MCH: 32.9 pg (ref 26.6–33.0)
MCHC: 32.8 g/dL (ref 31.5–35.7)
MCV: 101 fL — ABNORMAL HIGH (ref 79–97)
Monocytes Absolute: 0.9 10*3/uL (ref 0.1–0.9)
Monocytes: 10 %
Neutrophils Absolute: 5.6 10*3/uL (ref 1.4–7.0)
Neutrophils: 68 %
Platelets: 301 10*3/uL (ref 150–450)
RBC: 4.25 x10E6/uL (ref 4.14–5.80)
RDW: 12.1 % (ref 11.6–15.4)
WBC: 8.3 10*3/uL (ref 3.4–10.8)

## 2022-06-05 LAB — PSA, TOTAL AND FREE
PSA, Free Pct: 18.4 %
PSA, Free: 1.38 ng/mL
Prostate Specific Ag, Serum: 7.5 ng/mL — ABNORMAL HIGH (ref 0.0–4.0)

## 2022-06-05 LAB — LIPID PANEL
Chol/HDL Ratio: 2.8 ratio (ref 0.0–5.0)
Cholesterol, Total: 115 mg/dL (ref 100–199)
HDL: 41 mg/dL (ref >39)
LDL Chol Calc (NIH): 59 mg/dL (ref 0–99)
Triglycerides: 74 mg/dL (ref 0–149)
VLDL Cholesterol Cal: 15 mg/dL (ref 5–40)

## 2022-06-08 ENCOUNTER — Other Ambulatory Visit: Payer: Self-pay | Admitting: Cardiology

## 2022-06-08 ENCOUNTER — Ambulatory Visit (INDEPENDENT_AMBULATORY_CARE_PROVIDER_SITE_OTHER): Payer: Medicare Other | Admitting: Nurse Practitioner

## 2022-06-08 ENCOUNTER — Encounter: Payer: Self-pay | Admitting: Nurse Practitioner

## 2022-06-08 ENCOUNTER — Other Ambulatory Visit: Payer: Self-pay | Admitting: Nurse Practitioner

## 2022-06-08 VITALS — BP 118/63 | HR 58 | Temp 97.4°F | Resp 20 | Ht 66.0 in | Wt 140.0 lb

## 2022-06-08 DIAGNOSIS — K5730 Diverticulosis of large intestine without perforation or abscess without bleeding: Secondary | ICD-10-CM | POA: Diagnosis not present

## 2022-06-08 DIAGNOSIS — K219 Gastro-esophageal reflux disease without esophagitis: Secondary | ICD-10-CM

## 2022-06-08 DIAGNOSIS — I480 Paroxysmal atrial fibrillation: Secondary | ICD-10-CM

## 2022-06-08 DIAGNOSIS — I10 Essential (primary) hypertension: Secondary | ICD-10-CM | POA: Diagnosis not present

## 2022-06-08 DIAGNOSIS — R413 Other amnesia: Secondary | ICD-10-CM | POA: Diagnosis not present

## 2022-06-08 DIAGNOSIS — E034 Atrophy of thyroid (acquired): Secondary | ICD-10-CM

## 2022-06-08 DIAGNOSIS — I6930 Unspecified sequelae of cerebral infarction: Secondary | ICD-10-CM | POA: Diagnosis not present

## 2022-06-08 DIAGNOSIS — G10 Huntington's disease: Secondary | ICD-10-CM

## 2022-06-08 DIAGNOSIS — E785 Hyperlipidemia, unspecified: Secondary | ICD-10-CM | POA: Diagnosis not present

## 2022-06-08 DIAGNOSIS — N400 Enlarged prostate without lower urinary tract symptoms: Secondary | ICD-10-CM

## 2022-06-08 DIAGNOSIS — F3342 Major depressive disorder, recurrent, in full remission: Secondary | ICD-10-CM

## 2022-06-08 DIAGNOSIS — I4821 Permanent atrial fibrillation: Secondary | ICD-10-CM | POA: Diagnosis not present

## 2022-06-08 DIAGNOSIS — R269 Unspecified abnormalities of gait and mobility: Secondary | ICD-10-CM | POA: Diagnosis not present

## 2022-06-08 DIAGNOSIS — Z6824 Body mass index (BMI) 24.0-24.9, adult: Secondary | ICD-10-CM

## 2022-06-08 NOTE — Progress Notes (Signed)
Subjective:    Patient ID: Sergio Baker, male    DOB: 06-Oct-1934, 87 y.o.   MRN: [id number]   Chief Complaint: medical management of chronic issues     HPI:  Sergio Baker is a 87 y.o. who identifies as a male who was assigned male at birth.   Social history: Lives with: wife Work history: retired   Scientist, forensic in today for follow up of the following chronic medical issues:  1. Essential hypertension No c/o chest pain, sob or headache. Doe snot check blood pressure at home. BP Readings from Last 3 Encounters:  05/25/22 111/69  02/19/22 101/61  12/15/21 (!) 149/90     2. Permanent atrial fibrillation (HCC) No palpitations or heart racing. Is on eliquis with no bleeding issues.  3. Late effect of cerebrovascular accident (CVA) No permanent effects  4. Hyperlipidemia with target LDL less than 100 Eats whatever his wife fixes him. Not able to exercise due to unsteady gait. Lab Results  Component Value Date   CHOL 115 06/04/2022   HDL 41 06/04/2022   LDLCALC 59 06/04/2022   TRIG 74 06/04/2022   CHOLHDL 2.8 06/04/2022     5. Hypothyroidism due to acquired atrophy of thyroid No problems that he is aware of. Lab Results  Component Value Date   TSH 3.450 06/04/2022     6. Gastroesophageal reflux disease, unspecified whether esophagitis present Wife says he does not complain about reflux.  7. Diverticulosis of large intestine without hemorrhage Denies any recent flare ups  8. Huntington's chorea ([place]) Is doing well. Slowly progressing disease.  9. Memory deficits Wife says she thinks he is maintaining at this pint. He does repeat hisself a lot.  10. Abnormality of gait Due to huntingtons. [place] last week  11. Benign prostatic hyperplasia without lower urinary tract symptoms No voiding issues. Has not sen urology recently Lab Results  Component Value Date   PSA1 7.5 (H) 06/04/2022   PSA1 5.3 (H) 05/08/2019   PSA1 5.5 (H) 04/28/2018   PSA 4.8 (H)  01/18/2014      12. Recurrent major depressive disorder, in full remission ([place]) Is doing well. No medication side effect from celexa and lamictal. The lamictal just recently started.    06/08/2022    3:02 PM 02/19/2022    2:10 PM 12/15/2021   11:29 AM  Depression screen PHQ 2/9  Decreased Interest 0 0 0  Down, Depressed, Hopeless 0 0 1  PHQ - 2 Score 0 0 1  Altered sleeping 0 0 3  Tired, decreased energy 0 0 3  Change in appetite 0 0 0  Feeling bad or failure about yourself  0 0 0  Trouble concentrating 0 0 1  Moving slowly or fidgety/restless 0 0 1  Suicidal thoughts 0 0 0  PHQ-9 Score 0 0 9  Difficult doing work/chores Not difficult at all Not difficult at all Somewhat difficult     13. BMI 24.0-24.9, adult No weight changes Wt Readings from Last 3 Encounters:  06/08/22 140 lb (63.5 kg)  05/25/22 140 lb 4 oz (63.6 kg)  02/19/22 140 lb (63.5 kg)   BMI Readings from Last 3 Encounters:  06/08/22 22.60 kg/m  05/25/22 22.64 kg/m  02/19/22 22.60 kg/m     New complaints: He fell last week and hit his lower back and has a large contusion. He has not complained of any back paihn.  No Known Allergies Outpatient Encounter Medications as of 06/08/2022  Medication Sig  acetaminophen (TYLENOL) 650 MG CR tablet Take 1,300 mg by mouth daily as needed for pain.   Aloe Vera 25 MG CAPS Take 25 mg by mouth daily.   apixaban (ELIQUIS) 5 MG TABS tablet TAKE ONE (1) TABLET BY MOUTH TWO (2) TIMES DAILY   atorvastatin (LIPITOR) 80 MG tablet Take 1 tablet (80 mg total) by mouth at bedtime.   Cholecalciferol (VITAMIN D3) 2000 UNITS capsule Take 2,000 Units by mouth daily.   citalopram (CELEXA) 20 MG tablet Take 1 tablet (20 mg total) by mouth daily.   Deutetrabenazine 6 MG TABS Take 12 mg by mouth 2 (two) times daily.   Glucosamine-Chondroit-Vit C-Mn (GLUCOSAMINE CHONDR 1500 COMPLX PO) Take 1 tablet by mouth daily.   haloperidol (HALDOL) 1 MG tablet Take 1.5 tablets (1.5 mg total) by  mouth 2 (two) times daily.   lamoTRIgine (LAMICTAL) 25 MG tablet 1 twice a day x1wk 2 twice a day x 2nd wk 3 twice a day x3rd wk   levothyroxine (SYNTHROID) 50 MCG tablet Take 1 tablet (50 mcg total) by mouth daily before breakfast.   lisinopril-hydrochlorothiazide (ZESTORETIC) 20-12.5 MG tablet Take 1 tablet by mouth daily.   metoprolol succinate (TOPROL-XL) 25 MG 24 hr tablet Take 1 tablet (25 mg total) by mouth daily.   Multiple Vitamin (MULTIVITAMIN WITH MINERALS) TABS Take 1 tablet by mouth daily. For Men 50+   Omega-3 Fatty Acids (FISH OIL PO) Take 1 tablet by mouth daily.   omeprazole (PRILOSEC) 20 MG capsule Take 1 capsule (20 mg total) by mouth daily.   potassium chloride (KLOR-CON) 10 MEQ tablet TAKE ONE (1) TABLET BY MOUTH TWO (2) TIMES DAILY   lamoTRIgine (LAMICTAL) 100 MG tablet Take 1 tablet (100 mg total) by mouth 2 (two) times daily. (Patient not taking: Reported on 06/08/2022)   No facility-administered encounter medications on file as of 06/08/2022.    Past Surgical History:  Procedure Laterality Date   APPENDECTOMY     BIOPSY  07/19/2017   Procedure: BIOPSY;  Surgeon: Daneil Dolin, MD;  [facility];  Service: Endoscopy;;  colon   BRAIN SURGERY     CATARACT EXTRACTION W/PHACO Left 05/07/2017   Procedure: CATARACT EXTRACTION PHACO AND INTRAOCULAR LENS PLACEMENT ([place]);  Surgeon: Baruch Goldmann, MD;  Location: [facility];  Service: Ophthalmology;  Laterality: Left;  CDE: 10.49   CATARACT EXTRACTION W/PHACO Right 06/04/2017   Procedure: CATARACT EXTRACTION PHACO AND INTRAOCULAR LENS PLACEMENT RIGHT EYE;  Surgeon: Baruch Goldmann, MD;  Location: [facility];  Service: Ophthalmology;  Laterality: Right;  CDE: 7.07   COLONOSCOPY  02/2010   patchy erythema, minimal granularity in distal rectum, left-sided diverticula, ileal diverticulum.  Bx benign. next TCS 02/2012   COLONOSCOPY  03/24/2012   Dr. Gala Romney- inflammatory changes of the rectum and colon consistent with history of  U/C. bx= benign colonic mucosa   COLONOSCOPY N/A 10/05/2014   RMR: abnormal rectal and sigmoid mucosa as described above status post segmental biopsy. active colitis sigmoid colon.   COLONOSCOPY WITH PROPOFOL N/A 07/19/2017   Procedure: COLONOSCOPY WITH PROPOFOL;  Surgeon: Daneil Dolin, MD;  [facility];  Service: Endoscopy;  Laterality: N/A;  7:30am   cyst removed from urinary bladder     ESOPHAGOGASTRODUODENOSCOPY  04/2008   schatzki ring, s/p ED, hh   LUMBAR FUSION     SHOULDER ARTHROSCOPY W/ ROTATOR CUFF REPAIR Bilateral    TONSILLECTOMY      Family History  Problem Relation Age of Onset  Lung cancer Mother 65       deceased   Other Brother        Posttraumatic stress disorder   Cancer Brother        skin cancer   Cancer Sister        thyroid   Arthritis Sister    SIDS Brother    Huntington's disease Daughter    Colon cancer Neg Hx       Controlled substance contract: n/a     Review of Systems  Constitutional:  Negative for diaphoresis.  Eyes:  Negative for pain.  Respiratory:  Negative for shortness of breath.   Cardiovascular:  Negative for chest pain, palpitations and leg swelling.  Gastrointestinal:  Negative for abdominal pain.  Endocrine: Negative for polydipsia.  Skin:  Negative for rash.  Neurological:  Negative for dizziness, weakness and headaches.  Hematological:  Does not bruise/bleed easily.  All other systems reviewed and are negative.      Objective:   Physical Exam Vitals and nursing note reviewed.  Constitutional:      Appearance: Normal appearance. He is well-developed.  HENT:     Head: Normocephalic.     Nose: Nose normal.     Mouth/Throat:     Mouth: Mucous membranes are moist.     Pharynx: Oropharynx is clear.  Eyes:     Pupils: Pupils are equal, round, and reactive to light.  Neck:     Thyroid: No thyroid mass or thyromegaly.     Vascular: No carotid bruit or JVD.     Trachea: Phonation normal.  Cardiovascular:      Rate and Rhythm: Normal rate and regular rhythm.  Pulmonary:     Effort: Pulmonary effort is normal. No respiratory distress.     Breath sounds: Normal breath sounds.  Abdominal:     General: Bowel sounds are normal.     Palpations: Abdomen is soft.     Tenderness: There is no abdominal tenderness.  Musculoskeletal:        General: Normal range of motion.     Cervical back: Normal range of motion and neck supple.  Lymphadenopathy:     Cervical: No cervical adenopathy.  Skin:    General: Skin is warm and dry.  Neurological:     Mental Status: He is alert and oriented to person, place, and time.  Psychiatric:        Behavior: Behavior normal.        Thought Content: Thought content normal.        Judgment: Judgment normal.    BP 118/63   Pulse (!) 58   Temp (!) 97.4 F (36.3 C) (Temporal)   Resp 20   Ht 5' 6"  (1.676 m)   Wt 140 lb (63.5 kg)   SpO2 99%   BMI 22.60 kg/m         Assessment & Plan:   TAJUAN DUFAULT comes in today with chief complaint of No chief complaint on file.   Diagnosis and orders addressed:  1. Essential hypertension Low sodium diet  2. Permanent atrial fibrillation (HCC) Avoid caffeine  3. Late effect of cerebrovascular accident (CVA)  4. Hyperlipidemia with target LDL less than 100 Low fat diet  5. Hypothyroidism due to acquired atrophy of thyroid Labs reviewed  6. Gastroesophageal reflux disease, unspecified whether esophagitis present Avoid spicy foods Do not eat 2 hours prior to bedtime  7. Diverticulosis of large intestine without hemorrhage Watch diet to avoid flare ups  8. Huntington's chorea ([place]) Fall prevention  9. Memory deficits Orient daily  10. Abnormality of gait Fall prevention  11. Benign prostatic hyperplasia without lower urinary tract symptoms Refuses urology appointment  12. Recurrent major depressive disorder, in full remission ([place]) Stress management  13. BMI 24.0-24.9, adult Increase  calories in diet   Labs pending Health Maintenance reviewed Diet and exercise encouraged  Follow up plan: 3 month   [place], FNP

## 2022-06-08 NOTE — Telephone Encounter (Signed)
Prescription refill request for Eliquis received. Indication: AF Last office visit: 03/11/21  Lawanda Cousins NP (has appt with Dr Domenic Polite 2/24) Scr: 0.93 on 06/04/22 Age: 87 Weight: 69.9kg  Based on above findings Eliquis 65m twice daily is the appropriate dose.  Refill approved.

## 2022-06-08 NOTE — Telephone Encounter (Signed)
It is ok to go back to Haldol, but would prefer 1m bid to decrease the polypharmacy side effect.

## 2022-06-08 NOTE — Telephone Encounter (Signed)
Pt wife is calling. Stated she would like to talk to nurse about how Pt is doing on medication. She would not tell me anything requesting to speak with [facility]. She is requesting a call back from nurse.

## 2022-06-08 NOTE — Telephone Encounter (Signed)
I called pt's wife back. She sts of the weekend pt did well with the dosage of Haldol and Lamotrigine. Denies any dizzy spells. He is currently taking 1/2 tab of Haldol 1 mg in the morning and 1/2 in the evening.  He is also taking Lamictal 25 mg 2 twice per day and is scheduled to increase to 3 twice per day this week.  She sts the pt is not interested in pursuing the new Austedo medication, he would prefer to stay with the Haldol.   She reports the pt did fall over the weekend and is scheduled to she his PCP today, wife attributes fall to getting legs tangled in his pajamas.

## 2022-06-08 NOTE — Patient Instructions (Signed)

## 2022-06-09 DIAGNOSIS — M25512 Pain in left shoulder: Secondary | ICD-10-CM | POA: Diagnosis not present

## 2022-06-09 NOTE — Addendum Note (Signed)
Addended by: Lester Bell Buckle A on: 06/09/2022 08:14 AM   Modules accepted: Orders

## 2022-06-09 NOTE — Telephone Encounter (Signed)
I called patient, spoke with patient's wife, [name], per DPR. I advised her that Dr. Krista Blue would prefer patient decrease haldol to 4m BID if he would like to resume it. Patient's wife is agreeable to this and thinks she already has an RX for haldol 138mat home. She will let usKoreanow if she doesn't. Patient does not want to start austedo at this time.

## 2022-06-10 ENCOUNTER — Telehealth: Payer: Self-pay | Admitting: Neurology

## 2022-06-10 NOTE — Telephone Encounter (Signed)
I called wife of pt.  Pt has started lamotrigine 7m po bid , first dose last night.  This am he has experienced hallucinations, (he has huntingtons disease) she says staggers, dropping things, uncoordinated.  Pt feels like it is the lamotrigine. I relayed that if acute change could be having stroke, since he has hx of stroke.  She states that it is not that.  Pt is due to have EEG here tomorrow at 1045, wanting to know if to keep on then same dose 751mpo bid.  Please advise.  I did relay that pt needs to seek care from ED for worsening sx.

## 2022-06-10 NOTE — Telephone Encounter (Signed)
I called and spoke to wife after consulting with Dr Krista Blue.  She said for pt to stay on lamotrigine 44m po bid for now.   Pt has an EEG scheduled tomorrow at 1045.  Will see what results are and then address medication.  She appreciated call back.

## 2022-06-10 NOTE — Telephone Encounter (Signed)
Wife states that since pt has been on lamoTRIgine (LAMICTAL) 25 MG tablet he is having hallucinations, stumbling and is unable to hold anything.  Wife is asking for a call.

## 2022-06-11 ENCOUNTER — Ambulatory Visit: Payer: Medicare Other | Admitting: Neurology

## 2022-06-11 DIAGNOSIS — I639 Cerebral infarction, unspecified: Secondary | ICD-10-CM

## 2022-06-11 DIAGNOSIS — R410 Disorientation, unspecified: Secondary | ICD-10-CM | POA: Diagnosis not present

## 2022-06-11 DIAGNOSIS — G10 Huntington's disease: Secondary | ICD-10-CM

## 2022-06-17 ENCOUNTER — Telehealth: Payer: Self-pay | Admitting: Neurology

## 2022-06-17 DIAGNOSIS — R410 Disorientation, unspecified: Secondary | ICD-10-CM

## 2022-06-17 NOTE — Procedures (Signed)
   HISTORY: 87 year old male, with history of Huntington's disease, presenting with sudden onset sweaty confusional episode,  TECHNIQUE:  This is a routine 16 channel EEG recording with one channel devoted to a limited EKG recording.  It was performed during wakefulness, drowsiness and asleep. Photic stimulation were performed as activating procedures.  There are minimum muscle and movement artifact noted.  Upon maximum arousal, posterior dominant waking rhythm consistent of midly dysrhythmic alpha range activity. Activities are symmetric over the bilateral posterior derivations and attenuated with eye opening.  Hyperventilation was not performed.  Photic stimulation did not alter the tracing.  During EEG recording, patient developed drowsiness and no deeper stage of sleep was achieved  During EEG recording, there was no epileptiform discharge noted.  EKG demonstrate irregular cardiac rhythm,  CONCLUSION: This is a  normal EEG.  There is no electrodiagnostic evidence of epileptiform discharge.  There is incidental notice of irregular cardiac rhythm. Marcial Pacas, M.D. Ph.D.  [facility] [place], [place] [address] Phone: [phone] Fax:      [phone]

## 2022-06-17 NOTE — Telephone Encounter (Signed)
Pt wife is calling. Said she would like a nurse to giver her a call about EEG results. She is requesting a call from nurse.

## 2022-06-17 NOTE — Telephone Encounter (Signed)
Pt had EEG on 06/11/2022, will forward to MD, results not ready in epic yet.

## 2022-06-17 NOTE — Telephone Encounter (Signed)
Please call patient, EEG showed no significant abnormality, but there was incidental findings of irregular cardiac rhythm,  If he does not have a cardiologist, I will refer him to [facility] cardiologist for 30 days cardiac monitoring.  Orders Placed This Encounter  Procedures   Ambulatory referral to Cardiac Electrophysiology      Check to see if he is still on lamotrigine, if he has hard time tolerating it, it is okay to stop, document all event, we can discuss medication options after he finished cardiac monitoring,  Normal EEG does not rule out possibility of seizure.

## 2022-06-17 NOTE — Addendum Note (Signed)
Addended by: Marcial Pacas on: 06/17/2022 07:18 PM   Modules accepted: Orders

## 2022-06-18 ENCOUNTER — Telehealth: Payer: Self-pay | Admitting: Cardiology

## 2022-06-18 ENCOUNTER — Telehealth: Payer: Self-pay | Admitting: Neurology

## 2022-06-18 NOTE — Telephone Encounter (Signed)
Pt's wife, returned my call. She reports pt is doing better and tolerating the Lamotrigine 100 mg per day well. He is established with Dr. Domenic Polite and is currently being managed for Afib.   Dr. Domenic Polite has reviewed recommendation for 30 day heart monitor but declined to move forward he will continue to f/u with cardiology.  I relayed MD's recommendation wife reports at this time pt will continue lamotrigine 100 mg per day and she will keep [place] up to date on how he is doing.

## 2022-06-18 NOTE — Telephone Encounter (Signed)
Pt's wife is asking for a call to discuss options as to how pt can take his lamoTRIgine (LAMICTAL) 100 MG tablet

## 2022-06-18 NOTE — Telephone Encounter (Signed)
Vita Barley routed conversation to Gna-[facility] 2 Calls9 minutes ago (8:44 AM)   Braxton Feathers M9 minutes ago (8:44 AM)   LB Pt's wife is asking for a call to discuss options as to how pt can take his lamoTRIgine (LAMICTAL) 100 MG tablet      Note   Flegel,Linda [phone]  Vita Barley

## 2022-06-18 NOTE — Telephone Encounter (Signed)
See message from 06/17/2022, I have copied this message to note already in the system.

## 2022-06-18 NOTE — Telephone Encounter (Signed)
Dr. Krista Blue referral pt needs 30 days cardiac monitor. Pt has an appt with dr. Domenic Polite on 09/23/22. Dr. Krista Blue would like for cards order heart monitor for the pt per referral

## 2022-07-22 ENCOUNTER — Other Ambulatory Visit: Payer: Self-pay | Admitting: Nurse Practitioner

## 2022-07-23 DIAGNOSIS — R52 Pain, unspecified: Secondary | ICD-10-CM | POA: Diagnosis not present

## 2022-07-23 DIAGNOSIS — R109 Unspecified abdominal pain: Secondary | ICD-10-CM | POA: Diagnosis not present

## 2022-07-31 ENCOUNTER — Telehealth: Payer: Self-pay | Admitting: Neurology

## 2022-07-31 NOTE — Telephone Encounter (Signed)
Pt wife is calling. Stated pt is feeling really hot inside his body. She is requesting a call back from nurse

## 2022-08-04 NOTE — Telephone Encounter (Signed)
I called patient's wife, [name], per DPR.  On Thursdayof last week, patient complained of feeling hot inside his body.  Patient's wife checked his blood pressure and reportedly it was normal.  She called our office for further direction.  She did not contact PCP or go to urgent care.  She did not check his temperature.  She denies that he has missed any medications or has had any recent changes to his medical history.  He is back to baseline today.  She was told by PCP to contact neurology when this happens.  I advised her that I will let Dr. Dennis Bast know of this episode and if there are any further recommendations we will let her know.  I advised her that if this happens again to check patient's blood pressure, temperature, make sure he is hydrating well, and has not missed or added any doses of his medication.  Patient's wife verbalized understanding and appreciation.  Agree with above recommendation.  Marcial Pacas, M.D. Ph.D.  [facility] [place], [place] [address] Phone: [phone] Fax:      [phone]

## 2022-08-20 ENCOUNTER — Other Ambulatory Visit: Payer: Medicare Other

## 2022-08-20 DIAGNOSIS — I10 Essential (primary) hypertension: Secondary | ICD-10-CM

## 2022-08-20 DIAGNOSIS — E785 Hyperlipidemia, unspecified: Secondary | ICD-10-CM | POA: Diagnosis not present

## 2022-08-20 DIAGNOSIS — R739 Hyperglycemia, unspecified: Secondary | ICD-10-CM

## 2022-08-20 LAB — CBC WITH DIFFERENTIAL/PLATELET

## 2022-08-20 LAB — BAYER DCA HB A1C WAIVED: HB A1C (BAYER DCA - WAIVED): 6.6 % — ABNORMAL HIGH (ref 4.8–5.6)

## 2022-08-21 LAB — CBC WITH DIFFERENTIAL/PLATELET
Basophils Absolute: 0.1 10*3/uL (ref 0.0–0.2)
Basos: 1 %
EOS (ABSOLUTE): 0.1 10*3/uL (ref 0.0–0.4)
Eos: 2 %
Hematocrit: 43.7 % (ref 37.5–51.0)
Hemoglobin: 14.7 g/dL (ref 13.0–17.7)
Immature Grans (Abs): 0 10*3/uL (ref 0.0–0.1)
Immature Granulocytes: 0 %
Lymphocytes Absolute: 1.8 10*3/uL (ref 0.7–3.1)
Lymphs: 24 %
MCH: 32.7 pg (ref 26.6–33.0)
MCHC: 33.6 g/dL (ref 31.5–35.7)
MCV: 97 fL (ref 79–97)
Monocytes Absolute: 0.9 10*3/uL (ref 0.1–0.9)
Monocytes: 12 %
Neutrophils Absolute: 4.6 10*3/uL (ref 1.4–7.0)
Neutrophils: 61 %
Platelets: 291 10*3/uL (ref 150–450)
RBC: 4.5 x10E6/uL (ref 4.14–5.80)
RDW: 12.8 % (ref 11.6–15.4)
WBC: 7.5 10*3/uL (ref 3.4–10.8)

## 2022-08-21 LAB — CMP14+EGFR
ALT: 16 IU/L (ref 0–44)
AST: 28 IU/L (ref 0–40)
Albumin/Globulin Ratio: 1.6 (ref 1.2–2.2)
Albumin: 4.1 g/dL (ref 3.7–4.7)
Alkaline Phosphatase: 85 IU/L (ref 44–121)
BUN/Creatinine Ratio: 23 (ref 10–24)
BUN: 18 mg/dL (ref 8–27)
Bilirubin Total: 1.1 mg/dL (ref 0.0–1.2)
CO2: 26 mmol/L (ref 20–29)
Calcium: 8.9 mg/dL (ref 8.6–10.2)
Chloride: 98 mmol/L (ref 96–106)
Creatinine, Ser: 0.79 mg/dL (ref 0.76–1.27)
Globulin, Total: 2.6 g/dL (ref 1.5–4.5)
Glucose: 104 mg/dL — ABNORMAL HIGH (ref 70–99)
Potassium: 4.1 mmol/L (ref 3.5–5.2)
Sodium: 138 mmol/L (ref 134–144)
Total Protein: 6.7 g/dL (ref 6.0–8.5)
eGFR: 87 mL/min/{1.73_m2} (ref >59)

## 2022-08-21 LAB — LIPID PANEL
Chol/HDL Ratio: 2.7 ratio (ref 0.0–5.0)
Cholesterol, Total: 104 mg/dL (ref 100–199)
HDL: 38 mg/dL — ABNORMAL LOW (ref >39)
LDL Chol Calc (NIH): 53 mg/dL (ref 0–99)
Triglycerides: 58 mg/dL (ref 0–149)
VLDL Cholesterol Cal: 13 mg/dL (ref 5–40)

## 2022-08-27 ENCOUNTER — Ambulatory Visit (INDEPENDENT_AMBULATORY_CARE_PROVIDER_SITE_OTHER): Payer: Medicare Other | Admitting: Nurse Practitioner

## 2022-08-27 ENCOUNTER — Encounter: Payer: Self-pay | Admitting: Nurse Practitioner

## 2022-08-27 VITALS — BP 109/53 | HR 66 | Temp 96.5°F | Resp 20 | Ht 66.0 in | Wt 137.0 lb

## 2022-08-27 DIAGNOSIS — R269 Unspecified abnormalities of gait and mobility: Secondary | ICD-10-CM | POA: Diagnosis not present

## 2022-08-27 DIAGNOSIS — F3342 Major depressive disorder, recurrent, in full remission: Secondary | ICD-10-CM

## 2022-08-27 DIAGNOSIS — G10 Huntington's disease: Secondary | ICD-10-CM

## 2022-08-27 DIAGNOSIS — I480 Paroxysmal atrial fibrillation: Secondary | ICD-10-CM | POA: Diagnosis not present

## 2022-08-27 DIAGNOSIS — K219 Gastro-esophageal reflux disease without esophagitis: Secondary | ICD-10-CM | POA: Diagnosis not present

## 2022-08-27 DIAGNOSIS — I10 Essential (primary) hypertension: Secondary | ICD-10-CM

## 2022-08-27 DIAGNOSIS — I4821 Permanent atrial fibrillation: Secondary | ICD-10-CM

## 2022-08-27 DIAGNOSIS — I6930 Unspecified sequelae of cerebral infarction: Secondary | ICD-10-CM | POA: Diagnosis not present

## 2022-08-27 DIAGNOSIS — K5730 Diverticulosis of large intestine without perforation or abscess without bleeding: Secondary | ICD-10-CM | POA: Diagnosis not present

## 2022-08-27 DIAGNOSIS — Z6824 Body mass index (BMI) 24.0-24.9, adult: Secondary | ICD-10-CM

## 2022-08-27 DIAGNOSIS — E034 Atrophy of thyroid (acquired): Secondary | ICD-10-CM

## 2022-08-27 DIAGNOSIS — E785 Hyperlipidemia, unspecified: Secondary | ICD-10-CM | POA: Diagnosis not present

## 2022-08-27 DIAGNOSIS — R413 Other amnesia: Secondary | ICD-10-CM

## 2022-08-27 MED ORDER — ATORVASTATIN CALCIUM 80 MG PO TABS: 80.0000 mg | ORAL_TABLET | Freq: Every day | ORAL | 1 refills | Status: DC

## 2022-08-27 MED ORDER — OMEPRAZOLE 20 MG PO CPDR: 20.0000 mg | DELAYED_RELEASE_CAPSULE | Freq: Every day | ORAL | 1 refills | Status: DC

## 2022-08-27 MED ORDER — CITALOPRAM HYDROBROMIDE 20 MG PO TABS: 20.0000 mg | ORAL_TABLET | Freq: Every day | ORAL | 1 refills | Status: DC

## 2022-08-27 MED ORDER — POTASSIUM CHLORIDE ER 10 MEQ PO TBCR: 10.0000 meq | EXTENDED_RELEASE_TABLET | Freq: Two times a day (BID) | ORAL | 1 refills | Status: DC

## 2022-08-27 MED ORDER — HALOPERIDOL 1 MG PO TABS: 1.5000 mg | ORAL_TABLET | Freq: Two times a day (BID) | ORAL | 1 refills | Status: DC

## 2022-08-27 MED ORDER — METOPROLOL SUCCINATE ER 25 MG PO TB24: 25.0000 mg | ORAL_TABLET | Freq: Every day | ORAL | 1 refills | Status: DC

## 2022-08-27 MED ORDER — APIXABAN 5 MG PO TABS: ORAL_TABLET | ORAL | 5 refills | Status: DC

## 2022-08-27 MED ORDER — LEVOTHYROXINE SODIUM 50 MCG PO TABS: 50.0000 ug | ORAL_TABLET | Freq: Every day | ORAL | 1 refills | Status: DC

## 2022-08-27 MED ORDER — LISINOPRIL-HYDROCHLOROTHIAZIDE 20-12.5 MG PO TABS: 1.0000 | ORAL_TABLET | Freq: Every day | ORAL | 1 refills | Status: DC

## 2022-08-27 NOTE — Patient Instructions (Signed)

## 2022-08-27 NOTE — Progress Notes (Signed)
Subjective:    Patient ID: Sergio Baker, male    DOB: 01/19/1935, 88 y.o.   MRN: [id number]   Chief Complaint: medical management of chronic issues     HPI:  Sergio Baker is a 88 y.o. who identifies as a male who was assigned male at birth.   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 Essential hypertension No c/o chest pain, sob or headache. Doe nsot check blood pressure at home. BP Readings from Last 3 Encounters:  06/08/22 118/63  05/25/22 111/69  02/19/22 101/61     2. Permanent atrial fibrillation (HCC) No c/o palpitations or heart racing. Is on eliquis with no bleeding issues  3. Hyperlipidemia with target LDL less than 100 eats whatever his wife fixes him. Does no exercise Lab Results  Component Value Date   CHOL 104 08/20/2022   HDL 38 (L) 08/20/2022   LDLCALC 53 08/20/2022   TRIG 58 08/20/2022   CHOLHDL 2.7 08/20/2022     4. Hypothyroidism due to acquired atrophy of thyroid No issues that they are aware of. Lab Results  Component Value Date   TSH 3.450 06/04/2022     5. Gastroesophageal reflux disease, unspecified whether esophagitis present Is on opmeprazole daily and is doing well.  6. Late effect of cerebrovascular accident (CVA) No permanent effects  7. Huntington's chorea ([place]) Is worsening. Unable to sit steal.   8. Abnormality of gait Gait is becoming more unsteady. Is using  a walker or cane  9. Diverticulosis of large intestine without hemorrhage No recent flare ups  10. Memory deficits Wife says he seems to be maintaining  11. Recurrent major depressive disorder, in full remission ([place]) Is on celexa and lamictal. Seems to be doing well according to his wife  57. BMI 24.0-24.9, adult No recent weight changes Wt Readings from Last 3 Encounters:  08/27/22 137 lb (62.1 kg)  06/08/22 140 lb (63.5 kg)  05/25/22  140 lb 4 oz (63.6 kg)   BMI Readings from Last 3 Encounters:  08/27/22 22.11 kg/m  06/08/22 22.60 kg/m  05/25/22 22.64 kg/m     New complaints: None today  No Known Allergies Outpatient Encounter Medications as of 08/27/2022  Medication Sig   acetaminophen (TYLENOL) 650 MG CR tablet Take 1,300 mg by mouth daily as needed for pain.   Aloe Vera 25 MG CAPS Take 25 mg by mouth daily.   apixaban (ELIQUIS) 5 MG TABS tablet TAKE ONE (1) TABLET BY MOUTH TWO (2) TIMES DAILY   atorvastatin (LIPITOR) 80 MG tablet Take 1 tablet (80 mg total) by mouth at bedtime.   Cholecalciferol (VITAMIN D3) 2000 UNITS capsule Take 2,000 Units by mouth daily.   citalopram (CELEXA) 20 MG tablet Take 1 tablet (20 mg total) by mouth daily.   Glucosamine-Chondroit-Vit C-Mn (GLUCOSAMINE CHONDR 1500 COMPLX PO) Take 1 tablet by mouth daily.   haloperidol (HALDOL) 1 MG tablet Take 1.5 tablets (1.5 mg total) by mouth 2 (two) times daily. (Patient taking differently: Take 1 mg by mouth 2 (two) times daily.)   lamoTRIgine (LAMICTAL) 100 MG tablet Take 1 tablet (100 mg total) by mouth 2 (two) times daily. (Patient not taking: Reported on 06/08/2022)   lamoTRIgine (LAMICTAL) 25 MG tablet 1 twice a day x1wk 2 twice a day x 2nd wk 3 twice a day x3rd wk   levothyroxine (SYNTHROID) 50 MCG  tablet Take 1 tablet (50 mcg total) by mouth daily before breakfast.   lisinopril-hydrochlorothiazide (ZESTORETIC) 20-12.5 MG tablet Take 1 tablet by mouth daily.   metoprolol succinate (TOPROL-XL) 25 MG 24 hr tablet Take 1 tablet (25 mg total) by mouth daily.   Multiple Vitamin (MULTIVITAMIN WITH MINERALS) TABS Take 1 tablet by mouth daily. For Men 50+   Omega-3 Fatty Acids (FISH OIL PO) Take 1 tablet by mouth daily.   omeprazole (PRILOSEC) 20 MG capsule Take 1 capsule (20 mg total) by mouth daily.   potassium chloride (KLOR-CON) 10 MEQ tablet TAKE ONE (1) TABLET BY MOUTH TWO (2) TIMES DAILY   No facility-administered encounter  medications on file as of 08/27/2022.    Past Surgical History:  Procedure Laterality Date   APPENDECTOMY     BIOPSY  07/19/2017   Procedure: BIOPSY;  Surgeon: Daneil Dolin, MD;  Location: [facility];  Service: Endoscopy;;  colon   BRAIN SURGERY     CATARACT EXTRACTION W/PHACO Left 05/07/2017   Procedure: CATARACT EXTRACTION PHACO AND INTRAOCULAR LENS PLACEMENT ([place]);  Surgeon: Baruch Goldmann, MD;  Location: [facility] ORS;  Service: Ophthalmology;  Laterality: Left;  CDE: 10.49   CATARACT EXTRACTION W/PHACO Right 06/04/2017   Procedure: CATARACT EXTRACTION PHACO AND INTRAOCULAR LENS PLACEMENT RIGHT EYE;  Surgeon: Baruch Goldmann, MD;  Location: [facility];  Service: Ophthalmology;  Laterality: Right;  CDE: 7.07   COLONOSCOPY  02/2010   patchy erythema, minimal granularity in distal rectum, left-sided diverticula, ileal diverticulum.  Bx benign. next TCS 02/2012   COLONOSCOPY  03/24/2012   Dr. Gala Romney- inflammatory changes of the rectum and colon consistent with history of U/C. bx= benign colonic mucosa   COLONOSCOPY N/A 10/05/2014   RMR: abnormal rectal and sigmoid mucosa as described above status post segmental biopsy. active colitis sigmoid colon.   COLONOSCOPY WITH PROPOFOL N/A 07/19/2017   Procedure: COLONOSCOPY WITH PROPOFOL;  Surgeon: Daneil Dolin, MD;  Location: [facility];  Service: Endoscopy;  Laterality: N/A;  7:30am   cyst removed from urinary bladder     ESOPHAGOGASTRODUODENOSCOPY  04/2008   schatzki ring, s/p ED, hh   LUMBAR FUSION     SHOULDER ARTHROSCOPY W/ ROTATOR CUFF REPAIR Bilateral    TONSILLECTOMY      Family History  Problem Relation Age of Onset   Lung cancer Mother 68       deceased   Other Brother        Posttraumatic stress disorder   Cancer Brother        skin cancer   Cancer Sister        thyroid   Arthritis Sister    SIDS Brother    Huntington's disease Daughter    Colon cancer Neg Hx       Controlled substance contract: n/a     Review of Systems   Constitutional:  Negative for diaphoresis.  Eyes:  Negative for pain.  Respiratory:  Negative for shortness of breath.   Cardiovascular:  Negative for chest pain, palpitations and leg swelling.  Gastrointestinal:  Negative for abdominal pain.  Endocrine: Negative for polydipsia.  Skin:  Negative for rash.  Neurological:  Negative for dizziness, weakness and headaches.  Hematological:  Does not bruise/bleed easily.  All other systems reviewed and are negative.      Objective:   Physical Exam Vitals and nursing note reviewed.  Constitutional:      Appearance: Normal appearance. He is well-developed.  HENT:     Head: Normocephalic.  Nose: Nose normal.     Mouth/Throat:     Mouth: Mucous membranes are moist.     Pharynx: Oropharynx is clear.  Eyes:     Pupils: Pupils are equal, round, and reactive to light.  Neck:     Thyroid: No thyroid mass or thyromegaly.     Vascular: No carotid bruit or JVD.     Trachea: Phonation normal.  Cardiovascular:     Rate and Rhythm: Normal rate and regular rhythm.  Pulmonary:     Effort: Pulmonary effort is normal. No respiratory distress.     Breath sounds: Normal breath sounds.  Abdominal:     General: Bowel sounds are normal.     Palpations: Abdomen is soft.     Tenderness: There is no abdominal tenderness.  Musculoskeletal:        General: Normal range of motion.     Cervical back: Normal range of motion and neck supple.     Comments: Gait wobbly- but is using a walker  Lymphadenopathy:     Cervical: No cervical adenopathy.  Skin:    General: Skin is warm and dry.  Neurological:     Mental Status: He is alert and oriented to person, place, and time.     Comments: Lots of jerky movements  Psychiatric:        Behavior: Behavior normal.        Thought Content: Thought content normal.        Judgment: Judgment normal.     BP (!) 109/53   Pulse 66   Temp (!) 96.5 F (35.8 C) (Temporal)   Resp 20   Ht '5\' 6"'$  (1.676 m)   Wt  137 lb (62.1 kg)   SpO2 98%   BMI 22.11 kg/m   Lab Results  Component Value Date   HGBA1C 6.6 (H) 08/20/2022         Assessment & Plan:  Sergio Baker comes in today with chief complaint of Medical Management of Chronic Issues   Diagnosis and orders addressed:  1. Essential hypertension Low sodiumd eit - lisinopril-hydrochlorothiazide (ZESTORETIC) 20-12.5 MG tablet; Take 1 tablet by mouth daily.  Dispense: 90 tablet; Refill: 1  2. Permanent atrial fibrillation ([place]) Report heart racing  3. Hyperlipidemia with target LDL less than 100 Low fat diet - atorvastatin (LIPITOR) 80 MG tablet; Take 1 tablet (80 mg total) by mouth at bedtime.  Dispense: 90 tablet; Refill: 1  4. Hypothyroidism due to acquired atrophy of thyroid Labs oending - levothyroxine (SYNTHROID) 50 MCG tablet; Take 1 tablet (50 mcg total) by mouth daily before breakfast.  Dispense: 90 tablet; Refill: 1  5. Gastroesophageal reflux disease, unspecified whether esophagitis present Avoid spicy foods Do not eat 2 hours prior to bedtime - omeprazole (PRILOSEC) 20 MG capsule; Take 1 capsule (20 mg total) by mouth daily.  Dispense: 90 capsule; Refill: 1  6. Late effect of cerebrovascular accident (CVA)  7. Huntington's chorea (HCC) - haloperidol (HALDOL) 1 MG tablet; Take 1.5 tablets (1.5 mg total) by mouth 2 (two) times daily.  Dispense: 270 tablet; Refill: 1  8. Abnormality of gait Continue to use walker when walking  9. Diverticulosis of large intestine without hemorrhage Watch diet to prevent flare up  10. Memory deficits [place] daily  11. Recurrent major depressive disorder, in full remission ([place]) Stress management - citalopram (CELEXA) 20 MG tablet; Take 1 tablet (20 mg total) by mouth daily.  Dispense: 90 tablet; Refill: 1  12. BMI 24.0-24.9, adult  Discussed diet and exercise for person with BMI >25 Will recheck weight in 3-6 months   13. Paroxysmal atrial fibrillation (HCC) - apixaban  (ELIQUIS) 5 MG TABS tablet; TAKE ONE (1) TABLET BY MOUTH TWO (2) TIMES DAILY  Dispense: 60 tablet; Refill: 5 - metoprolol succinate (TOPROL-XL) 25 MG 24 hr tablet; Take 1 tablet (25 mg total) by mouth daily.  Dispense: 90 tablet; Refill: 1   Labs pending Health Maintenance reviewed Diet and exercise encouraged  Follow up plan: 6 months   Mary-Margaret Hassell Done, FNP

## 2022-09-22 ENCOUNTER — Encounter: Payer: Self-pay | Admitting: Adult Health

## 2022-09-22 ENCOUNTER — Ambulatory Visit: Payer: Medicare Other | Admitting: Adult Health

## 2022-09-22 VITALS — BP 137/77 | HR 65

## 2022-09-22 DIAGNOSIS — G10 Huntington's disease: Secondary | ICD-10-CM | POA: Diagnosis not present

## 2022-09-22 DIAGNOSIS — I639 Cerebral infarction, unspecified: Secondary | ICD-10-CM

## 2022-09-22 NOTE — Progress Notes (Signed)
PATIENT: Sergio Baker DOB: September 13, 1934  REASON FOR VISIT: follow up HISTORY FROM: patient PRIMARY NEUROLOGIST: Dr. Krista Blue  Chief Complaint  Patient presents with   Follow-up    Pt in 19 with wife  Pt here for Huntington's disease  and stroke f/u Pt states fell off chair in [place] 2 weeks ago Pt states didn't hit head and no ED needed  Wife states patient at times have headaches. Pt states at times he gets diaphoretic and have headaches. Wife states pt had EEG  and wants to discuss results      HISTORY OF PRESENT ILLNESS: Today 09/22/22  Sergio Baker is a 88 y.o. male who has been followed in this office for Huntington's disease and history of stroke. Returns today for follow-up.  He remains on Haldol 1 mg daily.  This seems to work the best for him.  There were questionable seizure-like events.  Patient is no longer taking Lamictal 100 mg twice a day.  Only has one event on 12/7 he had a spell- wife had to call EMS- patient told her that something was wrong but he couldn't describe it. Felt hot and had a mild headache. When EMS got there all there test was normal.  Has not had any additional episodes. Wife reports that morning he had an apple pie but had not eaten anything else since then.    HISTORY (copied from Dr. Krista Blue) Sergio Baker, is a 88 year old male, accompanied by his wife, follow-up for Huntington's disease, worsening gait abnormality,  I reviewed and summarized the referring note.PMHx. Atrial fibrillation, on eliquis HLD Depression HTN  He was seen by Dr. Jannifer Franklin since 2014, strong family history of Huntington's disease, 4 year old daughter just died of Huntington's disease, mother also had similar abnormal movement  More than 10 years ago, he developed slow worsening choreoathetotic movement, gradually getting worse, also developed gait abnormality, mild memory loss, but no significant mood disorder, wife reported that he is very pleasant to be around, he was  assumed to have diagnosis of Huntington's disease, but never had genetic testing  Personally reviewed MRI of the brain in March 2018, moderate atrophy, does have caved in caudate head, Repeat [facility] the [facility] at [facility] on October 4 for evaluation of dizziness, reported remote infarction in the right frontal lobe cortex left basal ganglion, uses MRI of the brain since 2018 but no acute process  He suffered stroke in 2022, with worsening chorea athetotic movement, mild residual left-sided weakness, gait abnormality,  Reviewed multiple evaluation by Dr. Jannifer Franklin in the past, has been treated with Haldol titrating dose, currently taking 1.5 mg twice a day for many years, was not sure about the benefit, I did notice that he has a tendency for tongue protrusion, chewing movement.  He presented with 3 episode of sudden onset dizziness since July 2023, wife described 1 episode, he was sitting on the sofa, suddenly felt dizzy, sweaty, confused, while wife attended him, he was gazing into space, not able to focus, lasting for few minutes, feels tired afterwards, there is no tonic-clonic movement I   REVIEW OF SYSTEMS: Out of a complete 14 system review of symptoms, the patient complains only of the following symptoms, and all other reviewed systems are negative.  ALLERGIES: No Known Allergies  HOME MEDICATIONS: Outpatient Medications Prior to Visit  Medication Sig Dispense Refill   acetaminophen (TYLENOL) 650 MG CR tablet Take 1,300 mg by mouth daily as needed for pain.  Aloe Vera 25 MG CAPS Take 25 mg by mouth daily.     apixaban (ELIQUIS) 5 MG TABS tablet TAKE ONE (1) TABLET BY MOUTH TWO (2) TIMES DAILY 60 tablet 5   atorvastatin (LIPITOR) 80 MG tablet Take 1 tablet (80 mg total) by mouth at bedtime. 90 tablet 1   Cholecalciferol (VITAMIN D3) 2000 UNITS capsule Take 2,000 Units by mouth daily.     citalopram (CELEXA) 20 MG tablet Take 1 tablet (20 mg total) by mouth daily. 90 tablet 1    Glucosamine-Chondroit-Vit C-Mn (GLUCOSAMINE CHONDR 1500 COMPLX PO) Take 1 tablet by mouth daily.     haloperidol (HALDOL) 1 MG tablet Take 1.5 tablets (1.5 mg total) by mouth 2 (two) times daily. 270 tablet 1   lamoTRIgine (LAMICTAL) 100 MG tablet Take 1 tablet (100 mg total) by mouth 2 (two) times daily. (Patient not taking: Reported on 06/08/2022) 60 tablet 11   lamoTRIgine (LAMICTAL) 25 MG tablet 1 twice a day x1wk 2 twice a day x 2nd wk 3 twice a day x3rd wk 84 tablet 0   levothyroxine (SYNTHROID) 50 MCG tablet Take 1 tablet (50 mcg total) by mouth daily before breakfast. 90 tablet 1   lisinopril-hydrochlorothiazide (ZESTORETIC) 20-12.5 MG tablet Take 1 tablet by mouth daily. 90 tablet 1   metoprolol succinate (TOPROL-XL) 25 MG 24 hr tablet Take 1 tablet (25 mg total) by mouth daily. 90 tablet 1   Multiple Vitamin (MULTIVITAMIN WITH MINERALS) TABS Take 1 tablet by mouth daily. For Men 50+     Omega-3 Fatty Acids (FISH OIL PO) Take 1 tablet by mouth daily.     omeprazole (PRILOSEC) 20 MG capsule Take 1 capsule (20 mg total) by mouth daily. 90 capsule 1   potassium chloride (KLOR-CON) 10 MEQ tablet Take 1 tablet (10 mEq total) by mouth 2 (two) times daily. 180 tablet 1   No facility-administered medications prior to visit.    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[place])    Diagnosed in the 1980s   Schatzki's ring    Stroke (cerebrum) ([place])    Ulcerative colitis ([place])     PAST SURGICAL HISTORY: Past Surgical History:  Procedure Laterality Date   APPENDECTOMY     BIOPSY  07/19/2017   Procedure: BIOPSY;  Surgeon:  Daneil Dolin, MD;  Location: [facility];  Service: Endoscopy;;  colon   BRAIN SURGERY     CATARACT EXTRACTION W/PHACO Left 05/07/2017   Procedure: CATARACT EXTRACTION PHACO AND INTRAOCULAR LENS PLACEMENT ([place]);  Surgeon: Baruch Goldmann, MD;  Location: [facility];  Service: Ophthalmology;  Laterality: Left;  CDE: 10.49   CATARACT EXTRACTION W/PHACO Right 06/04/2017   Procedure: CATARACT EXTRACTION PHACO AND INTRAOCULAR LENS PLACEMENT RIGHT EYE;  Surgeon: Baruch Goldmann, MD;  Location: [facility];  Service: Ophthalmology;  Laterality: Right;  CDE: 7.07   COLONOSCOPY  02/2010   patchy erythema, minimal granularity in distal rectum, left-sided diverticula, ileal diverticulum.  Bx benign. next TCS 02/2012   COLONOSCOPY  03/24/2012   Dr. Gala Romney- inflammatory changes of  the rectum and colon consistent with history of U/C. bx= benign colonic mucosa   COLONOSCOPY N/A 10/05/2014   RMR: abnormal rectal and sigmoid mucosa as described above status post segmental biopsy. active colitis sigmoid colon.   COLONOSCOPY WITH PROPOFOL N/A 07/19/2017   Procedure: COLONOSCOPY WITH PROPOFOL;  Surgeon: Daneil Dolin, MD;  Location: [facility];  Service: Endoscopy;  Laterality: N/A;  7:30am   cyst removed from urinary bladder     ESOPHAGOGASTRODUODENOSCOPY  04/2008   schatzki ring, s/p ED, hh   LUMBAR FUSION     SHOULDER ARTHROSCOPY W/ ROTATOR CUFF REPAIR Bilateral    TONSILLECTOMY      FAMILY HISTORY: Family History  Problem Relation Age of Onset   Lung cancer Mother 38       deceased   Other Brother        Posttraumatic stress disorder   Cancer Brother        skin cancer   Cancer Sister        thyroid   Arthritis Sister    SIDS Brother    Huntington's disease Daughter    Colon cancer Neg Hx     SOCIAL HISTORY: Social History   Socioeconomic History   Marital status: Married    Spouse name: [name]   Number of children: 1   Years of education: 12th   Highest education level: 12th grade  Occupational  History   Occupation: retired Furniture conservator/restorer   Occupation: works as Administrator, [employer] at [employer]  Tobacco Use   Smoking status: Former    Packs/day: 0.25    Years: 8.00    Total pack years: 2.00    Types: Cigarettes    Quit date: 05/03/1973    Years since quitting: 49.4   Smokeless tobacco: Never   Tobacco comments:    Quit 1970's   Vaping Use   Vaping Use: Never used  Substance and Sexual Activity   Alcohol use: No    Alcohol/week: 0.0 standard drinks of alcohol   Drug use: No   Sexual activity: Not on file  Other Topics Concern   Not on file  Social History Narrative   Patient lives at home with his wife.    Social Determinants of Health   Financial Resource Strain: Medium Risk (12/15/2021)   Overall Financial Resource Strain (CARDIA)    Difficulty of Paying Living Expenses: Somewhat hard  Food Insecurity: No Food Insecurity (12/15/2021)   Hunger Vital Sign    Worried About Running Out of Food in the Last Year: Never true    Ran Out of Food in the Last Year: Never true  Transportation Needs: No Transportation Needs (12/15/2021)   PRAPARE - Hydrologist (Medical): No    Lack of Transportation (Non-Medical): No  Physical Activity: Insufficiently Active (12/15/2021)   Exercise Vital Sign    Days of Exercise per Week: 7 days    Minutes of Exercise per Session: 10 min  Stress: No Stress Concern Present (12/15/2021)   [place]    Feeling of Stress : Only a little  Social Connections: Socially Integrated (12/15/2021)   Social Connection and Isolation Panel [NHANES]    Frequency of Communication with Friends and Family: More than three times a week    Frequency of Social Gatherings with Friends and Family: More than three times a week    Attends Religious Services: More than 4 times per year    Active  Member of Clubs or Organizations: Yes    Attends Music therapist: More than 4 times  per year    Marital Status: Married  Human resources officer Violence: Not At Risk (12/15/2021)   Humiliation, Afraid, Rape, and Kick questionnaire    Fear of Current or Ex-Partner: No    Emotionally Abused: No    Physically Abused: No    Sexually Abused: No      PHYSICAL EXAM  Vitals:   09/22/22 1102  BP: 137/77  Pulse: 65   There is no height or weight on file to calculate BMI.  Generalized: Well developed, in no acute distress   Neurological examination  Mentation: Alert oriented to time, place, history taking. Follows all commands. Speech dysarthric Cranial nerve II-XII: Pupils were equal round reactive to light. Extraocular movements were full, visual field were full on confrontational test. Facial sensation and strength were normal. Uvula tongue midline. Head turning and shoulder shrug  were normal and symmetric. Motor: The motor testing reveals 5 over 5 strength of all 4 extremities. Good symmetric motor tone is noted throughout.  Sensory: Sensory testing is intact to soft touch on all 4 extremities. No evidence of extinction is noted.  Coordination: Cerebellar testing reveals good finger-nose-finger and heel-to-shin bilaterally.  Gait and station: uses a walker when ambulating.    DIAGNOSTIC DATA (LABS, IMAGING, TESTING) - I reviewed patient records, labs, notes, testing and imaging myself where available.  Lab Results  Component Value Date   WBC 7.5 08/20/2022   HGB 14.7 08/20/2022   HCT 43.7 08/20/2022   MCV 97 08/20/2022   PLT 291 08/20/2022      Component Value Date/Time   NA 138 08/20/2022 1009   K 4.1 08/20/2022 1009   CL 98 08/20/2022 1009   CO2 26 08/20/2022 1009   GLUCOSE 104 (H) 08/20/2022 1009   GLUCOSE 89 07/14/2017 1314   BUN 18 08/20/2022 1009   CREATININE 0.79 08/20/2022 1009   CREATININE 0.90 12/26/2012 0907   CALCIUM 8.9 08/20/2022 1009   PROT 6.7 08/20/2022 1009   ALBUMIN 4.1 08/20/2022 1009   AST 28 08/20/2022 1009   ALT 16 08/20/2022 1009    ALKPHOS 85 08/20/2022 1009   BILITOT 1.1 08/20/2022 1009   GFRNONAA 82 08/01/2020 0950   GFRNONAA 83 12/26/2012 0907   GFRAA 95 08/01/2020 0950   GFRAA >89 12/26/2012 0907   Lab Results  Component Value Date   CHOL 104 08/20/2022   HDL 38 (L) 08/20/2022   LDLCALC 53 08/20/2022   TRIG 58 08/20/2022   CHOLHDL 2.7 08/20/2022   Lab Results  Component Value Date   HGBA1C 6.6 (H) 08/20/2022   Lab Results  Component Value Date   VITAMINB12 607 11/21/2012   Lab Results  Component Value Date   TSH 3.450 06/04/2022      ASSESSMENT AND PLAN 88 y.o. 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 here with:  1.  Huntington's disease  Continue Haldol 1 mg daily  2.  History of stroke and seizure-like events  Reviewed stroke risk factors with the patient and his wife Advised wife if he has any additional episodes of seizure like acitivity or diaphoresis then she should let us know.  FU in 6 months or sooner if needed      Ward Givens, MSN, NP-C 09/22/2022, 10:52 AM [facility] [address] [place], [place] [address] [phone]

## 2022-09-23 ENCOUNTER — Encounter: Payer: Self-pay | Admitting: Cardiology

## 2022-09-23 ENCOUNTER — Ambulatory Visit: Payer: Medicare Other | Attending: Cardiology | Admitting: Cardiology

## 2022-09-23 VITALS — BP 118/66 | HR 57 | Ht 67.0 in | Wt 140.0 lb

## 2022-09-23 DIAGNOSIS — I10 Essential (primary) hypertension: Secondary | ICD-10-CM | POA: Diagnosis not present

## 2022-09-23 DIAGNOSIS — I4821 Permanent atrial fibrillation: Secondary | ICD-10-CM | POA: Diagnosis not present

## 2022-09-23 DIAGNOSIS — Z8673 Personal history of transient ischemic attack (TIA), and cerebral infarction without residual deficits: Secondary | ICD-10-CM

## 2022-09-23 NOTE — Patient Instructions (Addendum)

## 2022-09-23 NOTE — Progress Notes (Signed)
Cardiology Office Note  Date: 09/23/2022   ID: Coltan, Spinello [birth year], MRN [id number]  PCP:  Chevis Pretty, FNP  Cardiologist:  Rozann Lesches, MD Electrophysiologist:  None   Chief Complaint  Patient presents with   Cardiac follow-up    History of Present Illness: GURINDER TORAL is an 88 y.o. male last seen in July 2022 by Mr. Leonides Sake NP (I last saw him in 2021).  He presents for a follow-up visit with his wife.  I reviewed his interval history.  He has Huntington's disease as well as interval stroke, has been on Eliquis for stroke prophylaxis since 2022.  Some question of seizure-like activity in the interim, did not tolerate Lamictal and is still following up with neurology, I reviewed the recent note.  He does not report any sense of palpitations, no chest pain, no sudden syncope.  Personally reviewed his ECG today which shows atrial fibrillation at 56 bpm, decreased R wave progression.  I reviewed the remainder of his medications which are noted below, also recent lab work.  His LDL is 53 on Lipitor.  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     Aloe Vera 25 MG CAPS Take 25 mg by mouth daily.     apixaban (ELIQUIS) 5 MG TABS tablet TAKE ONE (1) TABLET BY MOUTH TWO (2) TIMES DAILY 60 tablet 5   atorvastatin (LIPITOR) 80 MG tablet Take 1 tablet (80 mg  total) by mouth at bedtime. 90 tablet 1   Cholecalciferol (VITAMIN D3) 2000 UNITS capsule Take 2,000 Units by mouth daily.     citalopram (CELEXA) 20 MG tablet Take 1 tablet (20 mg total) by mouth daily. 90 tablet 1   Glucosamine-Chondroit-Vit C-Mn (GLUCOSAMINE CHONDR 1500 COMPLX PO) Take 1 tablet by mouth daily.     haloperidol (HALDOL) 1 MG tablet Take 1.5 tablets (1.5 mg total) by mouth 2 (two) times daily. 270 tablet 1   levothyroxine (SYNTHROID) 50 MCG tablet Take 1 tablet (50 mcg total) by mouth daily before breakfast. 90 tablet 1   lisinopril-hydrochlorothiazide (ZESTORETIC) 20-12.5 MG tablet Take 1 tablet by mouth daily. 90 tablet 1   metoprolol succinate (TOPROL-XL) 25 MG 24 hr tablet Take 1 tablet (25 mg total) by mouth daily. 90 tablet 1   Multiple Vitamin (MULTIVITAMIN WITH MINERALS) TABS Take 1 tablet by mouth daily. For Men 50+     Omega-3 Fatty Acids (FISH OIL PO) Take 1 tablet by mouth daily.     omeprazole (PRILOSEC) 20 MG capsule Take 1 capsule (20 mg total) by mouth daily. 90 capsule 1   potassium chloride (KLOR-CON) 10 MEQ tablet Take 1 tablet (10 mEq total) by mouth 2 (two) times daily. 180 tablet 1   No current facility-administered  medications for this visit.   Allergies:  Patient has no known allergies.   ROS: No orthopnea or PND.  Physical Exam: VS:  BP 118/66   Pulse (!) 57   Ht '5\' 7"'$  (1.702 m)   Wt 140 lb (63.5 kg)   SpO2 97%   BMI 21.93 kg/m , BMI Body mass index is 21.93 kg/m.  Wt Readings from Last 3 Encounters:  09/23/22 140 lb (63.5 kg)  08/27/22 137 lb (62.1 kg)  06/08/22 140 lb (63.5 kg)    General: Patient appears comfortable at rest. HEENT: Conjunctiva and lids normal. Neck: Supple, no elevated JVP or carotid bruits. Lungs: Clear to auscultation, nonlabored breathing at rest. Cardiac: Irregularly rregular, no S3, 1/6 systolic murmur. Extremities: No pitting edema.  ECG:  An ECG dated 03/11/2021 was personally reviewed today and demonstrated:   Atrial fibrillation with low voltage.  Recent Labwork: 06/04/2022: TSH 3.450 08/20/2022: ALT 16; AST 28; BUN 18; Creatinine, Ser 0.79; Hemoglobin 14.7; Platelets 291; Potassium 4.1; Sodium 138     Component Value Date/Time   CHOL 104 08/20/2022 1009   CHOL 143 12/26/2012 0907   TRIG 58 08/20/2022 1009   TRIG 122 10/25/2014 1056   TRIG 170 (H) 12/26/2012 0907   HDL 38 (L) 08/20/2022 1009   HDL 37 (L) 10/25/2014 1056   HDL 40 12/26/2012 0907   CHOLHDL 2.7 08/20/2022 1009   LDLCALC 53 08/20/2022 1009   LDLCALC 109 (H) 01/18/2014 1027   LDLCALC 69 12/26/2012 0907    Other Studies Reviewed Today:  Echocardiogram 12/27/2019:  1. Left ventricular ejection fraction, by estimation, is 60 to 65%. The  left ventricle has normal function. The left ventricle has no regional  wall motion abnormalities. There is mild left ventricular hypertrophy.  Left ventricular diastolic parameters  are indeterminate.   2. Right ventricular systolic function is normal. The right ventricular  size is moderately enlarged. There is mildly elevated pulmonary artery  systolic pressure. The estimated right ventricular systolic pressure is  67.1 mmHg.   3. Left atrial size was upper normal.   4. Right atrial size was severely dilated.   5. The mitral valve is grossly normal, mildly thickened. Mild mitral  valve regurgitation.   6. Tricuspid valve regurgitation is mild to moderate.   7. The aortic valve is tricuspid. Aortic valve regurgitation is mild.  Mild aortic valve sclerosis is present, with no evidence of aortic valve  stenosis.   8. The inferior vena cava is normal in size with <50% respiratory  variability, suggesting right atrial pressure of 8 mmHg.   9. Evidence of atrial level shunting detected by color flow Doppler.  There is a small secundum atrial septal defect with predominantly left to  right shunting across the atrial septum.   Assessment and Plan:  1.  Permanent atrial fibrillation with  CHA2DS2-VASc score of 3-4.  He is now on Eliquis for stroke prophylaxis as per above discussion and remains on low-dose Toprol-XL 25 mg daily.  Heart rate is in the mid 50s today and he is not clearly symptomatic with bradycardia.  Need to keep an eye on this particularly at age 81 as he may need to come off AV nodal blockers where there to be any evidence of symptomatic bradycardia or pauses.  2.  Essential hypertension, blood pressure normal range today.  He is also on Zestoretic with follow-up by PCP.  3.  History of stroke, continues on Lipitor with LDL 53 and is anticoagulated on Eliquis with  permanent atrial fibrillation.  Medication Adjustments/Labs and Tests Ordered: Current medicines are reviewed at length with the patient today.  Concerns regarding medicines are outlined above.   Tests Ordered: Orders Placed This Encounter  Procedures   EKG 12-Lead    Medication Changes: No orders of the defined types were placed in this encounter.   Disposition:  Follow up  6 months.  Signed, Satira Sark, MD, [facility] 09/23/2022 4:17 PM    [place] [facility] [place], [place], [place] [address] Phone: [phone]; Fax: [phone]

## 2022-10-22 DIAGNOSIS — L570 Actinic keratosis: Secondary | ICD-10-CM | POA: Diagnosis not present

## 2022-10-22 DIAGNOSIS — C44629 Squamous cell carcinoma of skin of left upper limb, including shoulder: Secondary | ICD-10-CM | POA: Diagnosis not present

## 2022-10-22 DIAGNOSIS — L821 Other seborrheic keratosis: Secondary | ICD-10-CM | POA: Diagnosis not present

## 2022-10-22 DIAGNOSIS — Z85828 Personal history of other malignant neoplasm of skin: Secondary | ICD-10-CM | POA: Diagnosis not present

## 2022-10-22 DIAGNOSIS — L905 Scar conditions and fibrosis of skin: Secondary | ICD-10-CM | POA: Diagnosis not present

## 2022-10-22 DIAGNOSIS — D0421 Carcinoma in situ of skin of right ear and external auricular canal: Secondary | ICD-10-CM | POA: Diagnosis not present

## 2022-10-22 DIAGNOSIS — L218 Other seborrheic dermatitis: Secondary | ICD-10-CM | POA: Diagnosis not present

## 2022-12-17 ENCOUNTER — Ambulatory Visit (INDEPENDENT_AMBULATORY_CARE_PROVIDER_SITE_OTHER): Payer: Medicare Other

## 2022-12-17 VITALS — Ht 67.0 in | Wt 140.0 lb

## 2022-12-17 DIAGNOSIS — Z0000 Encounter for general adult medical examination without abnormal findings: Secondary | ICD-10-CM

## 2022-12-17 NOTE — Progress Notes (Signed)
Subjective:   Sergio Baker is a 87 y.o. male who presents for Medicare Annual/Subsequent preventive examination. I connected with  Sergio Baker on 12/17/22 by a audio enabled telemedicine application and verified that I am speaking with the correct person using two identifiers.  Patient Location: Home  Provider Location: Home Office  I discussed the limitations of evaluation and management by telemedicine. The patient expressed understanding and agreed to proceed.  Review of Systems     Cardiac Risk Factors include: advanced age (>46men, >22 women);male gender;dyslipidemia     Objective:    Today's Vitals   12/17/22 0959  Weight: 140 lb (63.5 kg)  Height: 5\' 7"  (1.702 m)   Body mass index is 21.93 kg/m.     12/17/2022   10:02 AM 12/15/2021   11:28 AM 12/12/2020    2:04 PM 12/04/2019    2:14 PM 06/14/2018    2:49 PM 07/19/2017    6:42 AM 07/14/2017    1:15 PM  Advanced Directives  Does Patient Have a Medical Advance Directive? Yes Yes Yes Yes Yes No No  Type of Estate agent of [place];Living will Healthcare Power of [place];Living will Healthcare Power of [place];Living will Living will;Healthcare Power of [employer] Power of [place];Living will    Does patient want to make changes to medical advance directive?    No - Patient declined No - Patient declined    Copy of Healthcare Power of Attorney in Chart? No - copy requested No - copy requested No - copy requested No - copy requested No - copy requested    Would patient like information on creating a medical advance directive?      No - Patient declined No - Patient declined    Current Medications (verified) Outpatient Encounter Medications as of 12/17/2022  Medication Sig   acetaminophen (TYLENOL) 650 MG CR tablet Take 1,300 mg by mouth daily as needed for pain.   Aloe Vera 25 MG CAPS Take 25 mg by mouth daily.   apixaban (ELIQUIS) 5 MG TABS tablet TAKE ONE (1) TABLET BY MOUTH TWO (2)  TIMES DAILY   atorvastatin (LIPITOR) 80 MG tablet Take 1 tablet (80 mg total) by mouth at bedtime.   Cholecalciferol (VITAMIN D3) 2000 UNITS capsule Take 2,000 Units by mouth daily.   citalopram (CELEXA) 20 MG tablet Take 1 tablet (20 mg total) by mouth daily.   Glucosamine-Chondroit-Vit C-Mn (GLUCOSAMINE CHONDR 1500 COMPLX PO) Take 1 tablet by mouth daily.   haloperidol (HALDOL) 1 MG tablet Take 1.5 tablets (1.5 mg total) by mouth 2 (two) times daily.   levothyroxine (SYNTHROID) 50 MCG tablet Take 1 tablet (50 mcg total) by mouth daily before breakfast.   lisinopril-hydrochlorothiazide (ZESTORETIC) 20-12.5 MG tablet Take 1 tablet by mouth daily.   metoprolol succinate (TOPROL-XL) 25 MG 24 hr tablet Take 1 tablet (25 mg total) by mouth daily.   Multiple Vitamin (MULTIVITAMIN WITH MINERALS) TABS Take 1 tablet by mouth daily. For Men 50+   Omega-3 Fatty Acids (FISH OIL PO) Take 1 tablet by mouth daily.   omeprazole (PRILOSEC) 20 MG capsule Take 1 capsule (20 mg total) by mouth daily.   potassium chloride (KLOR-CON) 10 MEQ tablet Take 1 tablet (10 mEq total) by mouth 2 (two) times daily.   No facility-administered encounter medications on file as of 12/17/2022.    Allergies (verified) Patient has no known allergies.   History: Past Medical History:  Diagnosis Date   Abnormality of gait  Arthritis    Atrial fibrillation (HCC)    Depression    Diverticula of colon    Enlarged prostate    GERD (gastroesophageal reflux disease)    Hearing loss    Hemorrhoids    Hiatal hernia    Huntington's disease (HCC) 2010   Hyperlipidemia    Hypothyroidism    Memory deficits 01/17/2014   Proctocolitis, ulcerative (HCC)    Diagnosed in the 1980s   Schatzki's ring    Stroke (cerebrum) (HCC)    Ulcerative colitis (HCC)    Past Surgical History:  Procedure Laterality Date   APPENDECTOMY     BIOPSY  07/19/2017   Procedure: BIOPSY;  Surgeon: Corbin Ade, MD;  Location: [facility];   Service: Endoscopy;;  colon   BRAIN SURGERY     CATARACT EXTRACTION W/PHACO Left 05/07/2017   Procedure: CATARACT EXTRACTION PHACO AND INTRAOCULAR LENS PLACEMENT (IOC);  Surgeon: Fabio Pierce, MD;  Location: [facility];  Service: Ophthalmology;  Laterality: Left;  CDE: 10.49   CATARACT EXTRACTION W/PHACO Right 06/04/2017   Procedure: CATARACT EXTRACTION PHACO AND INTRAOCULAR LENS PLACEMENT RIGHT EYE;  Surgeon: Fabio Pierce, MD;  Location: [facility];  Service: Ophthalmology;  Laterality: Right;  CDE: 7.07   COLONOSCOPY  02/2010   patchy erythema, minimal granularity in distal rectum, left-sided diverticula, ileal diverticulum.  Bx benign. next TCS 02/2012   COLONOSCOPY  03/24/2012   Dr. Jena Gauss- inflammatory changes of the rectum and colon consistent with history of U/C. bx= benign colonic mucosa   COLONOSCOPY N/A 10/05/2014   RMR: abnormal rectal and sigmoid mucosa as described above status post segmental biopsy. active colitis sigmoid colon.   COLONOSCOPY WITH PROPOFOL N/A 07/19/2017   Procedure: COLONOSCOPY WITH PROPOFOL;  Surgeon: Corbin Ade, MD;  Location: [facility];  Service: Endoscopy;  Laterality: N/A;  7:30am   cyst removed from urinary bladder     ESOPHAGOGASTRODUODENOSCOPY  04/2008   schatzki ring, s/p ED, hh   LUMBAR FUSION     SHOULDER ARTHROSCOPY W/ ROTATOR CUFF REPAIR Bilateral    TONSILLECTOMY     Family History  Problem Relation Age of Onset   Lung cancer Mother 76       deceased   Cancer Sister        thyroid   Arthritis Sister    Other Brother        Posttraumatic stress disorder   Cancer Brother        skin cancer   SIDS Brother    Huntington's disease Daughter    Colon cancer Neg Hx    Stroke Neg Hx    Social History   Socioeconomic History   Marital status: Married    Spouse name: Bonita Quin   Number of children: 1   Years of education: 12th   Highest education level: 12th grade  Occupational History   Occupation: retired Chartered certified accountant   Occupation: works as  Engineer, materials at [employer]  Tobacco Use   Smoking status: Former    Packs/day: 0.25    Years: 8.00    Additional pack years: 0.00    Total pack years: 2.00    Types: Cigarettes    Quit date: 05/03/1973    Years since quitting: 49.6   Smokeless tobacco: Never   Tobacco comments:    Quit 1970's   Vaping Use   Vaping Use: Never used  Substance and Sexual Activity   Alcohol use: No    Alcohol/week: 0.0 standard drinks of alcohol  Drug use: No   Sexual activity: Not on file  Other Topics Concern   Not on file  Social History Narrative   Patient lives at home with his wife.    Social Determinants of Health   Financial Resource Strain: Low Risk  (12/17/2022)   Overall Financial Resource Strain (CARDIA)    Difficulty of Paying Living Expenses: Not hard at all  Food Insecurity: No Food Insecurity (12/17/2022)   Hunger Vital Sign    Worried About Running Out of Food in the Last Year: Never true    Ran Out of Food in the Last Year: Never true  Transportation Needs: No Transportation Needs (12/17/2022)   PRAPARE - Administrator, Civil Service (Medical): No    Lack of Transportation (Non-Medical): No  Physical Activity: Inactive (12/17/2022)   Exercise Vital Sign    Days of Exercise per Week: 0 days    Minutes of Exercise per Session: 0 min  Stress: No Stress Concern Present (12/17/2022)   [employer] of Occupational Health - Occupational Stress Questionnaire    Feeling of Stress : Not at all  Social Connections: Moderately Integrated (12/17/2022)   Social Connection and Isolation Panel [NHANES]    Frequency of Communication with Friends and Family: More than three times a week    Frequency of Social Gatherings with Friends and Family: More than three times a week    Attends Religious Services: More than 4 times per year    Active Member of [employer] or Organizations: No    Attends Banker Meetings: Never    Marital Status: Married    Tobacco  Counseling Counseling given: Not Answered Tobacco comments: Quit 1970's    Clinical Intake:  Pre-visit preparation completed: Yes  Pain : No/denies pain     Nutritional Risks: None Diabetes: No  How often do you need to have someone help you when you read instructions, pamphlets, or other written materials from your doctor or pharmacy?: 1 - Never  Diabetic?no   Interpreter Needed?: No  Information entered by :: Renie Ora, LPN   Activities of Daily Living    12/17/2022   10:02 AM  In your present state of health, do you have any difficulty performing the following activities:  Hearing? 0  Vision? 0  Difficulty concentrating or making decisions? 0  Walking or climbing stairs? 0  Dressing or bathing? 0  Doing errands, shopping? 0  Preparing Food and eating ? N  Using the Toilet? N  In the past six months, have you accidently leaked urine? N  Do you have problems with loss of bowel control? N  Managing your Medications? N  Managing your Finances? N  Housekeeping or managing your Housekeeping? N    Patient Care Team: Bennie Pierini, FNP as PCP - General (Nurse Practitioner) Jonelle Sidle, MD as PCP - Cardiology (Cardiology) Jena Gauss Gerrit Friends, MD (Gastroenterology)  Indicate any recent Medical Services you may have received from other than [facility] providers in the past year (date may be approximate).     Assessment:   This is a routine wellness examination for [place].  Hearing/Vision screen Vision Screening - Comments:: Wears rx glasses - up to date with routine eye exams with  Dr.Johnson   Dietary issues and exercise activities discussed: Current Exercise Habits: The patient does not participate in regular exercise at present   Goals Addressed             This Visit's Progress  DIET - INCREASE WATER INTAKE         Depression Screen    12/17/2022   10:02 AM 08/27/2022    2:39 PM 06/08/2022    3:02 PM 02/19/2022    2:10 PM 12/15/2021   11:29  AM 11/14/2021    2:13 PM 08/12/2021    2:19 PM  PHQ 2/9 Scores  PHQ - 2 Score 0 0 0 0 1 0 0  PHQ- 9 Score  1 0 0 9 0 0    Fall Risk    12/17/2022   10:00 AM 08/27/2022    2:39 PM 06/08/2022    3:02 PM 02/19/2022    2:09 PM 12/15/2021   10:38 AM  Fall Risk   Falls in the past year? 1 0 1 0 0  Number falls in past yr: 1    0  Injury with Fall? 1    0  Risk for fall due to : History of fall(s);Impaired balance/gait;Orthopedic patient    Impaired balance/gait;Orthopedic patient;Other (Comment)  Risk for fall due to: Comment     hx of stroke, Huntington's Chorea  Follow up Education provided;Falls prevention discussed    Education provided;Falls prevention discussed    FALL RISK PREVENTION PERTAINING TO THE HOME:  Any stairs in or around the home? No  If so, are there any without handrails? No  Home free of loose throw rugs in walkways, pet beds, electrical cords, etc? Yes  Adequate lighting in your home to reduce risk of falls? Yes   ASSISTIVE DEVICES UTILIZED TO PREVENT FALLS:  Life alert? No  Use of a cane, walker or w/c? Yes  Grab bars in the bathroom? No  Shower chair or bench in shower? No  Elevated toilet seat or a handicapped toilet? No       06/15/2018    5:00 PM 06/10/2017    3:10 PM 03/23/2017    1:38 PM 10/09/2016   11:47 AM 10/05/2016    6:18 PM  MMSE - Mini Mental State Exam  Not completed: Unable to complete      Orientation to time  5 5 5 3   Orientation to Place  5 5 5 5   Registration  3 3 3 2   Attention/ Calculation  5 5 5 5   Recall  2 3 3 1   Language- name 2 objects  2 2 2 2   Language- repeat  1 1 1 1   Language- follow 3 step command  3 3 3 3   Language- read & follow direction  1 1 1 1   Write a sentence  1 1 1 1   Copy design  1 1 1 1   Total score  29 30 30 25         12/17/2022   10:03 AM 12/15/2021   11:05 AM 12/04/2019    2:15 PM  6CIT Screen  What Year? 0 points 4 points 0 points  What month? 0 points 0 points 0 points  What time? 0 points 0 points  0 points  Count back from 20 0 points 0 points 0 points  Months in reverse 0 points 4 points 2 points  Repeat phrase 0 points 4 points 2 points  Total Score 0 points 12 points 4 points    Immunizations Immunization History  Administered Date(s) Administered   Fluad Quad(high Dose 65+) 04/28/2019, 05/03/2020, 05/09/2021, 05/21/2022   Influenza, High Dose Seasonal PF 05/12/2017, 05/17/2018   Influenza,inj,Quad PF,6+ Mos 05/14/2014, 05/16/2015, 05/07/2016   Moderna Sars-Covid-2 Vaccination 09/21/2019, 10/20/2019,  07/01/2020   Pneumococcal Conjugate-13 10/25/2014   Pneumococcal Polysaccharide-23 05/18/2007   Zoster Recombinat (Shingrix) 11/14/2021, 02/19/2022    TDAP status: Due, Education has been provided regarding the importance of this vaccine. Advised may receive this vaccine at local pharmacy or Health Dept. Aware to provide a copy of the vaccination record if obtained from local pharmacy or Health Dept. Verbalized acceptance and understanding.  Flu Vaccine status: Up to date  Pneumococcal vaccine status: Up to date  Covid-19 vaccine status: Completed vaccines  Qualifies for Shingles Vaccine? Yes   Zostavax completed Yes   Shingrix Completed?: Yes  Screening Tests Health Maintenance  Topic Date Due   DTaP/Tdap/Td (1 - Tdap) Never done   COVID-19 Vaccine (4 - 2023-24 season) 04/17/2022   INFLUENZA VACCINE  03/18/2023   Medicare Annual Wellness (AWV)  12/17/2023   Pneumonia Vaccine 80+ Years old  Completed   Zoster Vaccines- Shingrix  Completed   HPV VACCINES  Aged Out    Health Maintenance  Health Maintenance Due  Topic Date Due   DTaP/Tdap/Td (1 - Tdap) Never done   COVID-19 Vaccine (4 - 2023-24 season) 04/17/2022    Colorectal cancer screening: No longer required.   Lung Cancer Screening: (Low Dose CT Chest recommended if Age 77-80 years, 30 pack-year currently smoking OR have quit w/in 15years.) does not qualify.   Lung Cancer Screening Referral:  n/a  Additional Screening:  Hepatitis C Screening: does not qualify;   Vision Screening: Recommended annual ophthalmology exams for early detection of glaucoma and other disorders of the eye. Is the patient up to date with their annual eye exam?  Yes  Who is the provider or what is the name of the office in which the patient attends annual eye exams? Dr.Johnson  If pt is not established with a provider, would they like to be referred to a provider to establish care? No .   Dental Screening: Recommended annual dental exams for proper oral hygiene  Community Resource Referral / Chronic Care Management: CRR required this visit?  No   CCM required this visit?  No      Plan:     I have personally reviewed and noted the following in the patient's chart:   Medical and social history Use of alcohol, tobacco or illicit drugs  Current medications and supplements including opioid prescriptions. Patient is not currently taking opioid prescriptions. Functional ability and status Nutritional status Physical activity Advanced directives List of other physicians Hospitalizations, surgeries, and ER visits in previous 12 months Vitals Screenings to include cognitive, depression, and falls Referrals and appointments  In addition, I have reviewed and discussed with patient certain preventive protocols, quality metrics, and best practice recommendations. A written personalized care plan for preventive services as well as general preventive health recommendations were provided to patient.     Lorrene Reid, LPN   09/25/5282   Nurse Notes: none

## 2022-12-17 NOTE — Patient Instructions (Signed)
Sergio Baker , Thank you for taking time to come for your Medicare Wellness Visit. I appreciate your ongoing commitment to your health goals. Please review the following plan we discussed and let me know if I can assist you in the future.   These are the goals we discussed:  Goals      DIET - INCREASE WATER INTAKE     Have 3 meals a day     12/15/2021 AWV Goal: Improved Nutrition/Diet  Patient will verbalize understanding that diet plays an important role in overall health and that a poor diet is a risk factor for many chronic medical conditions.  Over the next year, patient will improve self management of their diet by incorporating better variety, less frequent dining out, decreased fat intake, fewer sweetened foods & beverages, increased physical activity, improved protein intake, better food choices, adequate fluid intake (at least 6 cups of fluid per day), and watch portion sizes/amount of food eaten at one time. Patient will utilize available community resources to help with food acquisition if needed (ex: food pantries, [address], etc) Patient will work with nutrition specialist if a referral was made         This is a list of the screening recommended for you and due dates:  Health Maintenance  Topic Date Due   DTaP/Tdap/Td vaccine (1 - Tdap) Never done   COVID-19 Vaccine (4 - 2023-24 season) 04/17/2022   Flu Shot  03/18/2023   Medicare Annual Wellness Visit  12/17/2023   Pneumonia Vaccine  Completed   Zoster (Shingles) Vaccine  Completed   HPV Vaccine  Aged Out    Advanced directives: Advance directive discussed with you today. I have provided a copy for you to complete at home and have notarized. Once this is complete please bring a copy in to our office so we can scan it into your chart.   Conditions/risks identified: Aim for 30 minutes of exercise or brisk walking, 6-8 glasses of water, and 5 servings of fruits and vegetables each day.   Next appointment: Follow up in one  year for your annual wellness visit.   Preventive Care 33 Years and Older, Male  Preventive care refers to lifestyle choices and visits with your health care provider that can promote health and wellness. What does preventive care include? A yearly physical exam. This is also called an annual well check. Dental exams once or twice a year. Routine eye exams. Ask your health care provider how often you should have your eyes checked. Personal lifestyle choices, including: Daily care of your teeth and gums. Regular physical activity. Eating a healthy diet. Avoiding tobacco and drug use. Limiting alcohol use. Practicing safe sex. Taking low doses of aspirin every day. Taking vitamin and mineral supplements as recommended by your health care provider. What happens during an annual well check? The services and screenings done by your health care provider during your annual well check will depend on your age, overall health, lifestyle risk factors, and family history of disease. Counseling  Your health care provider may ask you questions about your: Alcohol use. Tobacco use. Drug use. Emotional well-being. Home and relationship well-being. Sexual activity. Eating habits. History of falls. Memory and ability to understand (cognition). Work and work Astronomer. Screening  You may have the following tests or measurements: Height, weight, and BMI. Blood pressure. Lipid and cholesterol levels. These may be checked every 5 years, or more frequently if you are over 29 years old. Skin check. Lung cancer  screening. You may have this screening every year starting at age 19 if you have a 30-pack-year history of smoking and currently smoke or have quit within the past 15 years. Fecal occult blood test (FOBT) of the stool. You may have this test every year starting at age 70. Flexible sigmoidoscopy or colonoscopy. You may have a sigmoidoscopy every 5 years or a colonoscopy every 10 years starting  at age 50. Prostate cancer screening. Recommendations will vary depending on your family history and other risks. Hepatitis C blood test. Hepatitis B blood test. Sexually transmitted disease (STD) testing. Diabetes screening. This is done by checking your blood sugar (glucose) after you have not eaten for a while (fasting). You may have this done every 1-3 years. Abdominal aortic aneurysm (AAA) screening. You may need this if you are a current or former smoker. Osteoporosis. You may be screened starting at age 58 if you are at high risk. Talk with your health care provider about your test results, treatment options, and if necessary, the need for more tests. Vaccines  Your health care provider may recommend certain vaccines, such as: Influenza vaccine. This is recommended every year. Tetanus, diphtheria, and acellular pertussis (Tdap, Td) vaccine. You may need a Td booster every 10 years. Zoster vaccine. You may need this after age 65. Pneumococcal 13-valent conjugate (PCV13) vaccine. One dose is recommended after age 49. Pneumococcal polysaccharide (PPSV23) vaccine. One dose is recommended after age 43. Talk to your health care provider about which screenings and vaccines you need and how often you need them. This information is not intended to replace advice given to you by your health care provider. Make sure you discuss any questions you have with your health care provider. Document Released: 08/30/2015 Document Revised: 04/22/2016 Document Reviewed: 06/04/2015 Elsevier Interactive Patient Education  2017 [place] Prevention in the Home Falls can cause injuries. They can happen to people of all ages. There are many things you can do to make your home safe and to help prevent falls. What can I do on the outside of my home? Regularly fix the edges of walkways and driveways and fix any cracks. Remove anything that might make you trip as you walk through a door, such as a raised  step or threshold. Trim any bushes or trees on the path to your home. Use bright outdoor lighting. Clear any walking paths of anything that might make someone trip, such as rocks or tools. Regularly check to see if handrails are loose or broken. Make sure that both sides of any steps have handrails. Any raised decks and porches should have guardrails on the edges. Have any leaves, snow, or ice cleared regularly. Use sand or salt on walking paths during winter. Clean up any spills in your garage right away. This includes oil or grease spills. What can I do in the bathroom? Use night lights. Install grab bars by the toilet and in the tub and shower. Do not use towel bars as grab bars. Use non-skid mats or decals in the tub or shower. If you need to sit down in the shower, use a plastic, non-slip stool. Keep the floor dry. Clean up any water that spills on the floor as soon as it happens. Remove soap buildup in the tub or shower regularly. Attach bath mats securely with double-sided non-slip rug tape. Do not have throw rugs and other things on the floor that can make you trip. What can I do in the bedroom? Use night  lights. Make sure that you have a light by your bed that is easy to reach. Do not use any sheets or blankets that are too big for your bed. They should not hang down onto the floor. Have a firm chair that has side arms. You can use this for support while you get dressed. Do not have throw rugs and other things on the floor that can make you trip. What can I do in the kitchen? Clean up any spills right away. Avoid walking on wet floors. Keep items that you use a lot in easy-to-reach places. If you need to reach something above you, use a strong step stool that has a grab bar. Keep electrical cords out of the way. Do not use floor polish or wax that makes floors slippery. If you must use wax, use non-skid floor wax. Do not have throw rugs and other things on the floor that can  make you trip. What can I do with my stairs? Do not leave any items on the stairs. Make sure that there are handrails on both sides of the stairs and use them. Fix handrails that are broken or loose. Make sure that handrails are as long as the stairways. Check any carpeting to make sure that it is firmly attached to the stairs. Fix any carpet that is loose or worn. Avoid having throw rugs at the top or bottom of the stairs. If you do have throw rugs, attach them to the floor with carpet tape. Make sure that you have a light switch at the top of the stairs and the bottom of the stairs. If you do not have them, ask someone to add them for you. What else can I do to help prevent falls? Wear shoes that: Do not have high heels. Have rubber bottoms. Are comfortable and fit you well. Are closed at the toe. Do not wear sandals. If you use a stepladder: Make sure that it is fully opened. Do not climb a closed stepladder. Make sure that both sides of the stepladder are locked into place. Ask someone to hold it for you, if possible. Clearly mark and make sure that you can see: Any grab bars or handrails. First and last steps. Where the edge of each step is. Use tools that help you move around (mobility aids) if they are needed. These include: Canes. Walkers. Scooters. Crutches. Turn on the lights when you go into a dark area. Replace any light bulbs as soon as they burn out. Set up your furniture so you have a clear path. Avoid moving your furniture around. If any of your floors are uneven, fix them. If there are any pets around you, be aware of where they are. Review your medicines with your doctor. Some medicines can make you feel dizzy. This can increase your chance of falling. Ask your doctor what other things that you can do to help prevent falls. This information is not intended to replace advice given to you by your health care provider. Make sure you discuss any questions you have with  your health care provider. Document Released: 05/30/2009 Document Revised: 01/09/2016 Document Reviewed: 09/07/2014 Elsevier Interactive Patient Education  2017 [employer].

## 2022-12-25 ENCOUNTER — Other Ambulatory Visit: Payer: Medicare Other

## 2022-12-25 DIAGNOSIS — E034 Atrophy of thyroid (acquired): Secondary | ICD-10-CM | POA: Diagnosis not present

## 2022-12-25 DIAGNOSIS — E785 Hyperlipidemia, unspecified: Secondary | ICD-10-CM | POA: Diagnosis not present

## 2022-12-25 DIAGNOSIS — I10 Essential (primary) hypertension: Secondary | ICD-10-CM | POA: Diagnosis not present

## 2022-12-25 DIAGNOSIS — R7309 Other abnormal glucose: Secondary | ICD-10-CM | POA: Diagnosis not present

## 2022-12-25 LAB — BAYER DCA HB A1C WAIVED: HB A1C (BAYER DCA - WAIVED): 6.1 % — ABNORMAL HIGH (ref 4.8–5.6)

## 2022-12-26 LAB — CMP14+EGFR
ALT: 17 IU/L (ref 0–44)
AST: 25 IU/L (ref 0–40)
Albumin/Globulin Ratio: 2.2 (ref 1.2–2.2)
Albumin: 4.3 g/dL (ref 3.7–4.7)
Alkaline Phosphatase: 77 IU/L (ref 44–121)
BUN/Creatinine Ratio: 18 (ref 10–24)
BUN: 16 mg/dL (ref 8–27)
Bilirubin Total: 1 mg/dL (ref 0.0–1.2)
CO2: 24 mmol/L (ref 20–29)
Calcium: 9.4 mg/dL (ref 8.6–10.2)
Chloride: 98 mmol/L (ref 96–106)
Creatinine, Ser: 0.89 mg/dL (ref 0.76–1.27)
Globulin, Total: 2 g/dL (ref 1.5–4.5)
Glucose: 105 mg/dL — ABNORMAL HIGH (ref 70–99)
Potassium: 4 mmol/L (ref 3.5–5.2)
Sodium: 139 mmol/L (ref 134–144)
Total Protein: 6.3 g/dL (ref 6.0–8.5)
eGFR: 84 mL/min/{1.73_m2} (ref >59)

## 2022-12-26 LAB — CBC WITH DIFFERENTIAL/PLATELET
Basophils Absolute: 0 10*3/uL (ref 0.0–0.2)
Basos: 1 %
EOS (ABSOLUTE): 0.2 10*3/uL (ref 0.0–0.4)
Eos: 3 %
Hematocrit: 42 % (ref 37.5–51.0)
Hemoglobin: 13.9 g/dL (ref 13.0–17.7)
Immature Grans (Abs): 0 10*3/uL (ref 0.0–0.1)
Immature Granulocytes: 0 %
Lymphocytes Absolute: 1.6 10*3/uL (ref 0.7–3.1)
Lymphs: 25 %
MCH: 33.6 pg — ABNORMAL HIGH (ref 26.6–33.0)
MCHC: 33.1 g/dL (ref 31.5–35.7)
MCV: 101 fL — ABNORMAL HIGH (ref 79–97)
Monocytes Absolute: 0.8 10*3/uL (ref 0.1–0.9)
Monocytes: 13 %
Neutrophils Absolute: 3.7 10*3/uL (ref 1.4–7.0)
Neutrophils: 58 %
Platelets: 197 10*3/uL (ref 150–450)
RBC: 4.14 x10E6/uL (ref 4.14–5.80)
RDW: 13.1 % (ref 11.6–15.4)
WBC: 6.3 10*3/uL (ref 3.4–10.8)

## 2022-12-26 LAB — PSA, TOTAL AND FREE
PSA, Free Pct: 30 %
PSA, Free: 1.32 ng/mL
Prostate Specific Ag, Serum: 4.4 ng/mL — ABNORMAL HIGH (ref 0.0–4.0)

## 2022-12-26 LAB — LIPID PANEL
Chol/HDL Ratio: 2.5 ratio (ref 0.0–5.0)
Cholesterol, Total: 98 mg/dL — ABNORMAL LOW (ref 100–199)
HDL: 39 mg/dL — ABNORMAL LOW (ref >39)
LDL Chol Calc (NIH): 45 mg/dL (ref 0–99)
Triglycerides: 66 mg/dL (ref 0–149)
VLDL Cholesterol Cal: 14 mg/dL (ref 5–40)

## 2022-12-26 LAB — TSH: TSH: 4.58 u[IU]/mL — ABNORMAL HIGH (ref 0.450–4.500)

## 2022-12-28 ENCOUNTER — Encounter: Payer: Self-pay | Admitting: Nurse Practitioner

## 2022-12-28 ENCOUNTER — Ambulatory Visit (INDEPENDENT_AMBULATORY_CARE_PROVIDER_SITE_OTHER): Payer: Medicare Other | Admitting: Nurse Practitioner

## 2022-12-28 VITALS — BP 113/63 | HR 46 | Ht 67.0 in | Wt 140.0 lb

## 2022-12-28 DIAGNOSIS — I4821 Permanent atrial fibrillation: Secondary | ICD-10-CM | POA: Diagnosis not present

## 2022-12-28 DIAGNOSIS — E785 Hyperlipidemia, unspecified: Secondary | ICD-10-CM | POA: Diagnosis not present

## 2022-12-28 DIAGNOSIS — K219 Gastro-esophageal reflux disease without esophagitis: Secondary | ICD-10-CM

## 2022-12-28 DIAGNOSIS — R269 Unspecified abnormalities of gait and mobility: Secondary | ICD-10-CM | POA: Diagnosis not present

## 2022-12-28 DIAGNOSIS — G10 Huntington's disease: Secondary | ICD-10-CM

## 2022-12-28 DIAGNOSIS — K5730 Diverticulosis of large intestine without perforation or abscess without bleeding: Secondary | ICD-10-CM | POA: Diagnosis not present

## 2022-12-28 DIAGNOSIS — I6930 Unspecified sequelae of cerebral infarction: Secondary | ICD-10-CM

## 2022-12-28 DIAGNOSIS — E034 Atrophy of thyroid (acquired): Secondary | ICD-10-CM | POA: Diagnosis not present

## 2022-12-28 DIAGNOSIS — I10 Essential (primary) hypertension: Secondary | ICD-10-CM

## 2022-12-28 DIAGNOSIS — R413 Other amnesia: Secondary | ICD-10-CM

## 2022-12-28 DIAGNOSIS — F3342 Major depressive disorder, recurrent, in full remission: Secondary | ICD-10-CM

## 2022-12-28 DIAGNOSIS — N400 Enlarged prostate without lower urinary tract symptoms: Secondary | ICD-10-CM

## 2022-12-28 MED ORDER — OMEPRAZOLE 20 MG PO CPDR: 20.0000 mg | DELAYED_RELEASE_CAPSULE | Freq: Every day | ORAL | 1 refills | Status: DC

## 2022-12-28 MED ORDER — APIXABAN 5 MG PO TABS
ORAL_TABLET | ORAL | 5 refills | Status: DC
Start: 2022-12-28 — End: 2023-07-02

## 2022-12-28 MED ORDER — CITALOPRAM HYDROBROMIDE 20 MG PO TABS
20.0000 mg | ORAL_TABLET | Freq: Every day | ORAL | 1 refills | Status: DC
Start: 2022-12-28 — End: 2023-07-02

## 2022-12-28 MED ORDER — HALOPERIDOL 1 MG PO TABS: 1.5000 mg | ORAL_TABLET | Freq: Two times a day (BID) | ORAL | 1 refills | Status: DC

## 2022-12-28 MED ORDER — METOPROLOL SUCCINATE ER 25 MG PO TB24
25.0000 mg | ORAL_TABLET | Freq: Every day | ORAL | 1 refills | Status: DC
Start: 2022-12-28 — End: 2023-07-02

## 2022-12-28 MED ORDER — LEVOTHYROXINE SODIUM 50 MCG PO TABS
50.0000 ug | ORAL_TABLET | Freq: Every day | ORAL | 1 refills | Status: DC
Start: 2022-12-28 — End: 2023-07-02

## 2022-12-28 MED ORDER — LISINOPRIL-HYDROCHLOROTHIAZIDE 20-12.5 MG PO TABS: 1.0000 | ORAL_TABLET | Freq: Every day | ORAL | 1 refills | Status: DC

## 2022-12-28 MED ORDER — ATORVASTATIN CALCIUM 80 MG PO TABS: 80.0000 mg | ORAL_TABLET | Freq: Every day | ORAL | 1 refills | Status: DC

## 2022-12-28 NOTE — Patient Instructions (Signed)
Fall Prevention in the Home, Adult Falls can cause injuries and can happen to people of all ages. There are many things you can do to make your home safer and to help prevent falls. What actions can I take to prevent falls? General information Use good lighting in all rooms. Make sure to: Replace any light bulbs that burn out. Turn on the lights in dark areas and use night-lights. Keep items that you use often in easy-to-reach places. Lower the shelves around your home if needed. Move furniture so that there are clear paths around it. Do not use throw rugs or other things on the floor that can make you trip. If any of your floors are uneven, fix them. Add color or contrast paint or tape to clearly mark and help you see: Grab bars or handrails. First and last steps of staircases. Where the edge of each step is. If you use a ladder or stepladder: Make sure that it is fully opened. Do not climb a closed ladder. Make sure the sides of the ladder are locked in place. Have someone hold the ladder while you use it. Know where your pets are as you move through your home. What can I do in the bathroom?     Keep the floor dry. Clean up any water on the floor right away. Remove soap buildup in the bathtub or shower. Buildup makes bathtubs and showers slippery. Use non-skid mats or decals on the floor of the bathtub or shower. Attach bath mats securely with double-sided, non-slip rug tape. If you need to sit down in the shower, use a non-slip stool. Install grab bars by the toilet and in the bathtub and shower. Do not use towel bars as grab bars. What can I do in the bedroom? Make sure that you have a light by your bed that is easy to reach. Do not use any sheets or blankets on your bed that hang to the floor. Have a firm chair or bench with side arms that you can use for support when you get dressed. What can I do in the kitchen? Clean up any spills right away. If you need to reach something  above you, use a step stool with a grab bar. Keep electrical cords out of the way. Do not use floor polish or wax that makes floors slippery. What can I do with my stairs? Do not leave anything on the stairs. Make sure that you have a light switch at the top and the bottom of the stairs. Make sure that there are handrails on both sides of the stairs. Fix handrails that are broken or loose. Install non-slip stair treads on all your stairs if they do not have carpet. Avoid having throw rugs at the top or bottom of the stairs. Choose a carpet that does not hide the edge of the steps on the stairs. Make sure that the carpet is firmly attached to the stairs. Fix carpet that is loose or worn. What can I do on the outside of my home? Use bright outdoor lighting. Fix the edges of walkways and driveways and fix any cracks. Clear paths of anything that can make you trip, such as tools or rocks. Add color or contrast paint or tape to clearly mark and help you see anything that might make you trip as you walk through a door, such as a raised step or threshold. Trim any bushes or trees on paths to your home. Check to see if handrails are loose   or broken and that both sides of all steps have handrails. Install guardrails along the edges of any raised decks and porches. Have leaves, snow, or ice cleared regularly. Use sand, salt, or ice melter on paths if you live where there is ice and snow during the winter. Clean up any spills in your garage right away. This includes grease or oil spills. What other actions can I take? Review your medicines with your doctor. Some medicines can cause dizziness or changes in blood pressure, which increase your risk of falling. Wear shoes that: Have a low heel. Do not wear high heels. Have rubber bottoms and are closed at the toe. Feel good on your feet and fit well. Use tools that help you move around if needed. These include: Canes. Walkers. Scooters. Crutches. Ask  your doctor what else you can do to help prevent falls. This may include seeing a physical therapist to learn to do exercises to move better and get stronger. Where to find more information Centers for Disease Control and Prevention, [employer]: cdc.gov National Institute on Aging: nia.nih.gov National Institute on Aging: nia.nih.gov Contact a doctor if: You are afraid of falling at home. You feel weak, drowsy, or dizzy at home. You fall at home. Get help right away if you: Lose consciousness or have trouble moving after a fall. Have a fall that causes a head injury. These symptoms may be an emergency. Get help right away. Call 911. Do not wait to see if the symptoms will go away. Do not drive yourself to the hospital. This information is not intended to replace advice given to you by your health care provider. Make sure you discuss any questions you have with your health care provider. Document Revised: 04/06/2022 Document Reviewed: 04/06/2022 Elsevier Patient Education  2023 [employer]  

## 2022-12-28 NOTE — Progress Notes (Signed)
Subjective:    Patient ID: Sergio Baker, male    DOB: 10-08-1934, 88 y.o.   MRN: [id number]   Chief Complaint: medical management of chronic issues     HPI:  Sergio Baker is a 88 y.o. who identifies as a male who was assigned male at birth.   Social history: Lives with: wife who is his care giver Work history: retired   Water engineer in today for follow up of the following chronic medical issues:  1. Essential hypertension No c/o chest pain, sob or headache. Doe snot check blood pressure at home. BP Readings from Last 3 Encounters:  09/23/22 118/66  09/22/22 137/77  08/27/22 (!) 109/53     2. Permanent atrial fibrillation (HCC) Denies palpitations or heart racing.is on eliquis with  no bleeding issues   3. Late effect of cerebrovascular accident (CVA) No permanent effects  4. Gastroesophageal reflux disease, unspecified whether esophagitis present Is on omeprazole daily and is doing well.  5. Hypothyroidism due to acquired atrophy of thyroid No issues that he is aware of. Lab Results  Component Value Date   TSH 4.580 (H) 12/25/2022     6. Huntington's chorea (HCC) Is getting weaker. Has troubles with balance. Is still on halidol.  7. Benign prostatic hyperplasia without lower urinary tract symptoms No voiding issues Lab Results  Component Value Date   PSA1 4.4 (H) 12/25/2022   PSA1 7.5 (H) 06/04/2022   PSA1 5.3 (H) 05/08/2019   PSA 4.8 (H) 01/18/2014    8. Recurrent major depressive disorder, in full remission (HCC) Is on celexa daily and is doing well.    12/17/2022   10:02 AM 08/27/2022    2:39 PM 06/08/2022    3:02 PM  Depression screen PHQ 2/9  Decreased Interest 0 0 0  Down, Depressed, Hopeless 0 0 0  PHQ - 2 Score 0 0 0  Altered sleeping  0 0  Tired, decreased energy  1 0  Change in appetite  0 0  Feeling bad or failure about yourself   0 0  Trouble concentrating  0 0  Moving slowly or fidgety/restless  0 0  Suicidal thoughts  0 0  PHQ-9  Score  1 0  Difficult doing work/chores  Not difficult at all Not difficult at all     9. Diverticulosis of large intestine without hemorrhage No recent flare ups  10. Hyperlipidemia with target LDL less than 100 Eats whatever his wife gives him to eat. Lab Results  Component Value Date   CHOL 98 (L) 12/25/2022   HDL 39 (L) 12/25/2022   LDLCALC 45 12/25/2022   TRIG 66 12/25/2022   CHOLHDL 2.5 12/25/2022     11. Abnormality of gait No recent falls but has had near fall episodes  12. Memory deficits Wife says he gets more confused by the day. She has to repeat herself a lot.   New complaints: None today  No Known Allergies Outpatient Encounter Medications as of 12/28/2022  Medication Sig   acetaminophen (TYLENOL) 650 MG CR tablet Take 1,300 mg by mouth daily as needed for pain.   Aloe Vera 25 MG CAPS Take 25 mg by mouth daily.   apixaban (ELIQUIS) 5 MG TABS tablet TAKE ONE (1) TABLET BY MOUTH TWO (2) TIMES DAILY   atorvastatin (LIPITOR) 80 MG tablet Take 1 tablet (80 mg total) by mouth at bedtime.   Cholecalciferol (VITAMIN D3) 2000 UNITS capsule Take 2,000 Units by mouth daily.   citalopram (CELEXA)  20 MG tablet Take 1 tablet (20 mg total) by mouth daily.   Glucosamine-Chondroit-Vit C-Mn (GLUCOSAMINE CHONDR 1500 COMPLX PO) Take 1 tablet by mouth daily.   haloperidol (HALDOL) 1 MG tablet Take 1.5 tablets (1.5 mg total) by mouth 2 (two) times daily.   levothyroxine (SYNTHROID) 50 MCG tablet Take 1 tablet (50 mcg total) by mouth daily before breakfast.   lisinopril-hydrochlorothiazide (ZESTORETIC) 20-12.5 MG tablet Take 1 tablet by mouth daily.   metoprolol succinate (TOPROL-XL) 25 MG 24 hr tablet Take 1 tablet (25 mg total) by mouth daily.   Multiple Vitamin (MULTIVITAMIN WITH MINERALS) TABS Take 1 tablet by mouth daily. For Men 50+   Omega-3 Fatty Acids (FISH OIL PO) Take 1 tablet by mouth daily.   omeprazole (PRILOSEC) 20 MG capsule Take 1 capsule (20 mg total) by mouth  daily.   potassium chloride (KLOR-CON) 10 MEQ tablet Take 1 tablet (10 mEq total) by mouth 2 (two) times daily.   No facility-administered encounter medications on file as of 12/28/2022.    Past Surgical History:  Procedure Laterality Date   APPENDECTOMY     BIOPSY  07/19/2017   Procedure: BIOPSY;  Surgeon: Corbin Ade, MD;  Location: [facility];  Service: Endoscopy;;  colon   BRAIN SURGERY     CATARACT EXTRACTION W/PHACO Left 05/07/2017   Procedure: CATARACT EXTRACTION PHACO AND INTRAOCULAR LENS PLACEMENT (IOC);  Surgeon: Fabio Pierce, MD;  Location: [facility];  Service: Ophthalmology;  Laterality: Left;  CDE: 10.49   CATARACT EXTRACTION W/PHACO Right 06/04/2017   Procedure: CATARACT EXTRACTION PHACO AND INTRAOCULAR LENS PLACEMENT RIGHT EYE;  Surgeon: Fabio Pierce, MD;  Location: [facility];  Service: Ophthalmology;  Laterality: Right;  CDE: 7.07   COLONOSCOPY  02/2010   patchy erythema, minimal granularity in distal rectum, left-sided diverticula, ileal diverticulum.  Bx benign. next TCS 02/2012   COLONOSCOPY  03/24/2012   Dr. Jena Gauss- inflammatory changes of the rectum and colon consistent with history of U/C. bx= benign colonic mucosa   COLONOSCOPY N/A 10/05/2014   RMR: abnormal rectal and sigmoid mucosa as described above status post segmental biopsy. active colitis sigmoid colon.   COLONOSCOPY WITH PROPOFOL N/A 07/19/2017   Procedure: COLONOSCOPY WITH PROPOFOL;  Surgeon: Corbin Ade, MD;  Location: [facility];  Service: Endoscopy;  Laterality: N/A;  7:30am   cyst removed from urinary bladder     ESOPHAGOGASTRODUODENOSCOPY  04/2008   schatzki ring, s/p ED, hh   LUMBAR FUSION     SHOULDER ARTHROSCOPY W/ ROTATOR CUFF REPAIR Bilateral    TONSILLECTOMY      Family History  Problem Relation Age of Onset   Lung cancer Mother 48       deceased   Cancer Sister        thyroid   Arthritis Sister    Other Brother        Posttraumatic stress disorder   Cancer Brother        skin  cancer   SIDS Brother    Huntington's disease Daughter    Colon cancer Neg Hx    Stroke Neg Hx       Controlled substance contract: n/a     Review of Systems  Constitutional:  Negative for diaphoresis.  Eyes:  Negative for pain.  Respiratory:  Negative for shortness of breath.   Cardiovascular:  Negative for chest pain, palpitations and leg swelling.  Gastrointestinal:  Negative for abdominal pain.  Endocrine: Negative for polydipsia.  Skin:  Negative for rash.  Neurological:  Negative for dizziness, weakness and headaches.  Hematological:  Does not bruise/bleed easily.  All other systems reviewed and are negative.      Objective:   Physical Exam Vitals and nursing note reviewed.  Constitutional:      Appearance: Normal appearance. He is well-developed.  HENT:     Head: Normocephalic.     Nose: Nose normal.     Mouth/Throat:     Mouth: Mucous membranes are moist.     Pharynx: Oropharynx is clear.  Eyes:     Pupils: Pupils are equal, round, and reactive to light.  Neck:     Thyroid: No thyroid mass or thyromegaly.     Vascular: No carotid bruit or JVD.     Trachea: Phonation normal.  Cardiovascular:     Rate and Rhythm: Normal rate. Rhythm irregular.  Pulmonary:     Effort: Pulmonary effort is normal. No respiratory distress.     Breath sounds: Normal breath sounds.  Abdominal:     General: Bowel sounds are normal.     Palpations: Abdomen is soft.     Tenderness: There is no abdominal tenderness.  Musculoskeletal:        General: Normal range of motion.     Cervical back: Normal range of motion and neck supple.     Comments: Gait slow and steady  Lymphadenopathy:     Cervical: No cervical adenopathy.  Skin:    General: Skin is warm and dry.  Neurological:     Mental Status: He is alert and oriented to person, place, and time.     Comments: Jerky movements   Psychiatric:        Behavior: Behavior normal.        Thought Content: Thought content normal.         Judgment: Judgment normal.    BP 113/63   Pulse (!) 46   Ht 5\' 7"  (1.702 m)   Wt 140 lb (63.5 kg)   SpO2 97%   BMI 21.93 kg/m         Assessment & Plan:   Sergio Baker comes in today with chief complaint of Medical Management of Chronic Issues and Hypertension   Diagnosis and orders addressed:  1. Essential hypertension Low sodium diet - lisinopril-hydrochlorothiazide (ZESTORETIC) 20-12.5 MG tablet; Take 1 tablet by mouth daily.  Dispense: 90 tablet; Refill: 1  2. Permanent atrial fibrillation (HCC) Avoid caffeine - apixaban (ELIQUIS) 5 MG TABS tablet; TAKE ONE (1) TABLET BY MOUTH TWO (2) TIMES DAILY  Dispense: 60 tablet; Refill: 5 - metoprolol succinate (TOPROL-XL) 25 MG 24 hr tablet; Take 1 tablet (25 mg total) by mouth daily.  Dispense: 90 tablet; Refill: 1  3. Late effect of cerebrovascular accident (CVA)  4. Gastroesophageal reflux disease, unspecified whether esophagitis present Avoid spicy foods Do not eat 2 hours prior to bedtime - omeprazole (PRILOSEC) 20 MG capsule; Take 1 capsule (20 mg total) by mouth daily.  Dispense: 90 capsule; Refill: 1  5. Hypothyroidism due to acquired atrophy of thyroid Labs reveiwed No medication changes today - levothyroxine (SYNTHROID) 50 MCG tablet; Take 1 tablet (50 mcg total) by mouth daily before breakfast.  Dispense: 90 tablet; Refill: 1  6. Huntington's chorea (HCC) Fall prevention - haloperidol (HALDOL) 1 MG tablet; Take 1.5 tablets (1.5 mg total) by mouth 2 (two) times daily.  Dispense: 270 tablet; Refill: 1  7. Benign prostatic hyperplasia without lower urinary tract symptoms Report any voiding issues  8. Recurrent major  depressive disorder, in full remission (HCC) - citalopram (CELEXA) 20 MG tablet; Take 1 tablet (20 mg total) by mouth daily.  Dispense: 90 tablet; Refill: 1  9. Diverticulosis of large intestine without hemorrhage Watch what feed him to prevent flare up  10. Hyperlipidemia with target  LDL less than 100 Low fat diet - atorvastatin (LIPITOR) 80 MG tablet; Take 1 tablet (80 mg total) by mouth at bedtime.  Dispense: 90 tablet; Refill: 1  11. Abnormality of gait Fall prevention  12. Memory deficits Orient daily    Labs reviewed at appointment Health Maintenance reviewed Diet and exercise encouraged  Follow up plan: 3 months   Mary-Margaret Daphine Deutscher, FNP

## 2023-03-29 ENCOUNTER — Other Ambulatory Visit: Payer: Self-pay

## 2023-03-29 ENCOUNTER — Telehealth: Payer: Self-pay | Admitting: Nurse Practitioner

## 2023-03-29 DIAGNOSIS — R739 Hyperglycemia, unspecified: Secondary | ICD-10-CM

## 2023-03-29 DIAGNOSIS — I10 Essential (primary) hypertension: Secondary | ICD-10-CM

## 2023-03-29 DIAGNOSIS — E785 Hyperlipidemia, unspecified: Secondary | ICD-10-CM

## 2023-03-29 NOTE — Telephone Encounter (Signed)
Future orders placed 

## 2023-03-29 NOTE — Telephone Encounter (Signed)
Please add labs for patient. Made appt for 8/15 and he will see PCP on 8/19

## 2023-03-31 ENCOUNTER — Ambulatory Visit: Payer: Medicare Other | Attending: Cardiology | Admitting: Cardiology

## 2023-03-31 ENCOUNTER — Encounter: Payer: Self-pay | Admitting: Cardiology

## 2023-03-31 VITALS — BP 128/62 | HR 54 | Ht 66.0 in | Wt 138.4 lb

## 2023-03-31 DIAGNOSIS — E782 Mixed hyperlipidemia: Secondary | ICD-10-CM

## 2023-03-31 DIAGNOSIS — I4821 Permanent atrial fibrillation: Secondary | ICD-10-CM | POA: Diagnosis not present

## 2023-03-31 DIAGNOSIS — I10 Essential (primary) hypertension: Secondary | ICD-10-CM | POA: Diagnosis not present

## 2023-03-31 NOTE — Patient Instructions (Addendum)

## 2023-03-31 NOTE — Progress Notes (Signed)
    Cardiology Office Note  Date: 03/31/2023   ID: Sergio Baker, DOB 1934-10-08, MRN [id number]  History of Present Illness: Sergio Baker is an 88 y.o. male last seen in February.  He is here today with his wife for a follow-up visit.  He does not report any sense of palpitations.  He uses a walker to get around with chronic balance problems, history of stroke and Huntington's disease.  He continues to follow with neurology.  I reviewed his medications.  He remains on Eliquis for stroke prophylaxis with atrial fibrillation.  No spontaneous bleeding problems.  He is also on Lipitor with excellent LDL control at 45 in May.  Physical Exam: VS:  BP 128/62   Pulse (!) 54   Ht 5\' 6"  (1.676 m)   Wt 138 lb 6.4 oz (62.8 kg)   SpO2 94%   BMI 22.34 kg/m , BMI Body mass index is 22.34 kg/m.  Wt Readings from Last 3 Encounters:  03/31/23 138 lb 6.4 oz (62.8 kg)  12/28/22 140 lb (63.5 kg)  12/17/22 140 lb (63.5 kg)    General: Patient appears comfortable at rest. HEENT: Conjunctiva and lids normal. Neck: Supple, no elevated JVP or carotid bruits. Lungs: Clear to auscultation, nonlabored breathing at rest. Cardiac: Irregularly irregular, 1/6 systolic murmur. Extremities: No pitting edema.  ECG:  An ECG dated 09/23/2022 was personally reviewed today and demonstrated:  Atrial fibrillation with decreased R wave progression.  Labwork: 12/25/2022: ALT 17; AST 25; BUN 16; Creatinine, Ser 0.89; Hemoglobin 13.9; Platelets 197; Potassium 4.0; Sodium 139; TSH 4.580     Component Value Date/Time   CHOL 98 (L) 12/25/2022 0943   CHOL 143 12/26/2012 0907   TRIG 66 12/25/2022 0943   TRIG 122 10/25/2014 1056   TRIG 170 (H) 12/26/2012 0907   HDL 39 (L) 12/25/2022 0943   HDL 37 (L) 10/25/2014 1056   HDL 40 12/26/2012 0907   CHOLHDL 2.5 12/25/2022 0943   LDLCALC 45 12/25/2022 0943   LDLCALC 109 (H) 01/18/2014 1027   LDLCALC 69 12/26/2012 7829   Other Studies Reviewed Today:  No interval  cardiac testing for review today.  Assessment and Plan:  1.  Permanent atrial fibrillation with CHA2DS2-VASc score of 3-4.  He is asymptomatic in terms of palpitations and has good heart rate control on Toprol-XL.  Continues on Eliquis for stroke prophylaxis.  I reviewed his lab work from May.  He denies any spontaneous bleeding problems.   2.  Essential hypertension.  Blood pressure is well-controlled today.  Also on Zestoretic.   3.  History of stroke.  4.  Mixed hyperlipidemia.  LDL 45 in May.  He is doing well on Lipitor.  Disposition:  Follow up  6 months.  Signed, Jonelle Sidle, M.D., F.A.C.C. [facility]

## 2023-04-01 ENCOUNTER — Other Ambulatory Visit: Payer: Self-pay | Admitting: Nurse Practitioner

## 2023-04-01 ENCOUNTER — Other Ambulatory Visit: Payer: Medicare Other

## 2023-04-01 DIAGNOSIS — R739 Hyperglycemia, unspecified: Secondary | ICD-10-CM | POA: Diagnosis not present

## 2023-04-01 DIAGNOSIS — E785 Hyperlipidemia, unspecified: Secondary | ICD-10-CM

## 2023-04-01 DIAGNOSIS — I10 Essential (primary) hypertension: Secondary | ICD-10-CM

## 2023-04-01 LAB — CMP14+EGFR
ALT: 19 IU/L (ref 0–44)
AST: 29 IU/L (ref 0–40)
Albumin: 4.4 g/dL (ref 3.7–4.7)
Alkaline Phosphatase: 86 IU/L (ref 44–121)
BUN/Creatinine Ratio: 15 (ref 10–24)
BUN: 12 mg/dL (ref 8–27)
Bilirubin Total: 1.2 mg/dL (ref 0.0–1.2)
CO2: 27 mmol/L (ref 20–29)
Calcium: 8.9 mg/dL (ref 8.6–10.2)
Chloride: 99 mmol/L (ref 96–106)
Creatinine, Ser: 0.79 mg/dL (ref 0.76–1.27)
Globulin, Total: 2.4 g/dL (ref 1.5–4.5)
Glucose: 97 mg/dL (ref 70–99)
Potassium: 3.7 mmol/L (ref 3.5–5.2)
Sodium: 143 mmol/L (ref 134–144)
Total Protein: 6.8 g/dL (ref 6.0–8.5)
eGFR: 87 mL/min/{1.73_m2} (ref >59)

## 2023-04-01 LAB — CBC WITH DIFFERENTIAL/PLATELET
Basophils Absolute: 0 10*3/uL (ref 0.0–0.2)
Basos: 0 %
EOS (ABSOLUTE): 0.2 10*3/uL (ref 0.0–0.4)
Eos: 3 %
Hematocrit: 43.1 % (ref 37.5–51.0)
Hemoglobin: 14.1 g/dL (ref 13.0–17.7)
Immature Grans (Abs): 0 10*3/uL (ref 0.0–0.1)
Immature Granulocytes: 0 %
Lymphocytes Absolute: 1.7 10*3/uL (ref 0.7–3.1)
Lymphs: 23 %
MCH: 32.2 pg (ref 26.6–33.0)
MCHC: 32.7 g/dL (ref 31.5–35.7)
MCV: 98 fL — ABNORMAL HIGH (ref 79–97)
Monocytes Absolute: 0.8 10*3/uL (ref 0.1–0.9)
Monocytes: 11 %
Neutrophils Absolute: 4.4 10*3/uL (ref 1.4–7.0)
Neutrophils: 63 %
Platelets: 178 10*3/uL (ref 150–450)
RBC: 4.38 x10E6/uL (ref 4.14–5.80)
RDW: 12.8 % (ref 11.6–15.4)
WBC: 7.1 10*3/uL (ref 3.4–10.8)

## 2023-04-01 LAB — BAYER DCA HB A1C WAIVED: HB A1C (BAYER DCA - WAIVED): 6.4 % — ABNORMAL HIGH (ref 4.8–5.6)

## 2023-04-01 LAB — LIPID PANEL
Chol/HDL Ratio: 2.6 ratio (ref 0.0–5.0)
Cholesterol, Total: 113 mg/dL (ref 100–199)
HDL: 44 mg/dL (ref >39)
LDL Chol Calc (NIH): 53 mg/dL (ref 0–99)
Triglycerides: 82 mg/dL (ref 0–149)
VLDL Cholesterol Cal: 16 mg/dL (ref 5–40)

## 2023-04-05 ENCOUNTER — Encounter: Payer: Self-pay | Admitting: Nurse Practitioner

## 2023-04-05 ENCOUNTER — Ambulatory Visit (INDEPENDENT_AMBULATORY_CARE_PROVIDER_SITE_OTHER): Payer: Medicare Other | Admitting: Nurse Practitioner

## 2023-04-05 VITALS — BP 112/60 | HR 57 | Temp 98.1°F | Resp 20 | Ht 66.0 in | Wt 136.0 lb

## 2023-04-05 DIAGNOSIS — R269 Unspecified abnormalities of gait and mobility: Secondary | ICD-10-CM | POA: Diagnosis not present

## 2023-04-05 DIAGNOSIS — I4821 Permanent atrial fibrillation: Secondary | ICD-10-CM | POA: Diagnosis not present

## 2023-04-05 DIAGNOSIS — I6930 Unspecified sequelae of cerebral infarction: Secondary | ICD-10-CM

## 2023-04-05 DIAGNOSIS — I10 Essential (primary) hypertension: Secondary | ICD-10-CM

## 2023-04-05 DIAGNOSIS — Z6824 Body mass index (BMI) 24.0-24.9, adult: Secondary | ICD-10-CM | POA: Diagnosis not present

## 2023-04-05 DIAGNOSIS — K5730 Diverticulosis of large intestine without perforation or abscess without bleeding: Secondary | ICD-10-CM | POA: Diagnosis not present

## 2023-04-05 DIAGNOSIS — R413 Other amnesia: Secondary | ICD-10-CM

## 2023-04-05 DIAGNOSIS — I639 Cerebral infarction, unspecified: Secondary | ICD-10-CM

## 2023-04-05 DIAGNOSIS — E785 Hyperlipidemia, unspecified: Secondary | ICD-10-CM

## 2023-04-05 DIAGNOSIS — K219 Gastro-esophageal reflux disease without esophagitis: Secondary | ICD-10-CM | POA: Diagnosis not present

## 2023-04-05 DIAGNOSIS — E034 Atrophy of thyroid (acquired): Secondary | ICD-10-CM

## 2023-04-05 DIAGNOSIS — F3342 Major depressive disorder, recurrent, in full remission: Secondary | ICD-10-CM

## 2023-04-05 DIAGNOSIS — G10 Huntington's disease: Secondary | ICD-10-CM

## 2023-04-05 NOTE — Progress Notes (Signed)
Subjective:    Patient ID: Sergio Baker, male    DOB: 1935-04-21, 88 y.o.   MRN: [id number]   Chief Complaint: medical management of chronic issues     HPI:  Sergio Baker is a 88 y.o. who identifies as a male who was assigned male at birth.   Social history: Lives with: wife Work history: retired   Water engineer in today for follow up of the following chronic medical issues:  1. Essential hypertension No c/o chest pain, sob or headache. Doe snot check blood pressure at home. BP Readings from Last 3 Encounters:  03/31/23 128/62  12/28/22 113/63  09/23/22 118/66     2. Permanent atrial fibrillation (HCC) Is on eliquis bid with no bleeding issues. Denies any palpitations or heart racing.  3. Ischemic stroke (HCC) Has no permanent effects  4. Hyperlipidemia with target LDL less than 100 Has a very poor appetite. Eats very little- says he is not hungry Lab Results  Component Value Date   CHOL 113 04/01/2023   HDL 44 04/01/2023   LDLCALC 53 04/01/2023   TRIG 82 04/01/2023   CHOLHDL 2.6 04/01/2023      5. Gastroesophageal reflux disease, unspecified whether esophagitis present Is on omeprazole daily and is dong well.  6. Hypothyroidism due to acquired atrophy of thyroid No issues that he is aware of Lab Results  Component Value Date   TSH 4.580 (H) 12/25/2022     7. Diverticulosis of large intestine without hemorrhage No recent flare ups  8. Late effect of cerebrovascular accident (CVA) No permanent effects   9. Recurrent major depressive disorder, in full remission (HCC) Is on celexa and has been for many years. Says he is dojng well.    04/05/2023    2:17 PM 12/17/2022   10:02 AM 08/27/2022    2:39 PM  Depression screen PHQ 2/9  Decreased Interest 0 0 0  Down, Depressed, Hopeless 0 0 0  PHQ - 2 Score 0 0 0  Altered sleeping 0  0  Tired, decreased energy 0  1  Change in appetite 0  0  Feeling bad or failure about yourself  0  0  Trouble  concentrating 0  0  Moving slowly or fidgety/restless 0  0  Suicidal thoughts 0  0  PHQ-9 Score 0  1  Difficult doing work/chores Not difficult at all  Not difficult at all       10. Huntington's chorea (HCC) Sees specialist. Nothing they can do for him but treat symptoms. He is on haldol and takes it like he wants to instead of the high dose they want him to take.  11. Abnormality of gait Due to huntingtons chorea- wife says he has not had any recent falls.  12. Memory deficits Wife tries to orient daily  13. BMI 24.0-24.9, adult No recent weight changes Wt Readings from Last 3 Encounters:  04/05/23 136 lb (61.7 kg)  03/31/23 138 lb 6.4 oz (62.8 kg)  12/28/22 140 lb (63.5 kg)   BMI Readings from Last 3 Encounters:  04/05/23 21.95 kg/m  03/31/23 22.34 kg/m  12/28/22 21.93 kg/m      New complaints: None today  No Known Allergies Outpatient Encounter Medications as of 04/05/2023  Medication Sig   acetaminophen (TYLENOL) 650 MG CR tablet Take 1,300 mg by mouth daily as needed for pain.   Aloe Vera 25 MG CAPS Take 25 mg by mouth daily.   apixaban (ELIQUIS) 5 MG TABS tablet TAKE  ONE (1) TABLET BY MOUTH TWO (2) TIMES DAILY   atorvastatin (LIPITOR) 80 MG tablet Take 1 tablet (80 mg total) by mouth at bedtime.   Cholecalciferol (VITAMIN D3) 2000 UNITS capsule Take 2,000 Units by mouth daily.   citalopram (CELEXA) 20 MG tablet Take 1 tablet (20 mg total) by mouth daily.   Glucosamine-Chondroit-Vit C-Mn (GLUCOSAMINE CHONDR 1500 COMPLX PO) Take 1 tablet by mouth daily.   haloperidol (HALDOL) 1 MG tablet Take 1.5 tablets (1.5 mg total) by mouth 2 (two) times daily.   levothyroxine (SYNTHROID) 50 MCG tablet Take 1 tablet (50 mcg total) by mouth daily before breakfast.   lisinopril-hydrochlorothiazide (ZESTORETIC) 20-12.5 MG tablet Take 1 tablet by mouth daily.   metoprolol succinate (TOPROL-XL) 25 MG 24 hr tablet Take 1 tablet (25 mg total) by mouth daily.   Multiple Vitamin  (MULTIVITAMIN WITH MINERALS) TABS Take 1 tablet by mouth daily. For Men 50+   Omega-3 Fatty Acids (FISH OIL PO) Take 1 tablet by mouth daily.   omeprazole (PRILOSEC) 20 MG capsule Take 1 capsule (20 mg total) by mouth daily.   potassium chloride (KLOR-CON) 10 MEQ tablet TAKE ONE (1) TABLET BY MOUTH TWO (2) TIMES DAILY   No facility-administered encounter medications on file as of 04/05/2023.    Past Surgical History:  Procedure Laterality Date   APPENDECTOMY     BIOPSY  07/19/2017   Procedure: BIOPSY;  Surgeon: Corbin Ade, MD;  [facility];  Service: Endoscopy;;  colon   BRAIN SURGERY     CATARACT EXTRACTION W/PHACO Left 05/07/2017   Procedure: CATARACT EXTRACTION PHACO AND INTRAOCULAR LENS PLACEMENT (IOC);  Surgeon: Fabio Pierce, MD;  Location: [facility];  Service: Ophthalmology;  Laterality: Left;  CDE: 10.49   CATARACT EXTRACTION W/PHACO Right 06/04/2017   Procedure: CATARACT EXTRACTION PHACO AND INTRAOCULAR LENS PLACEMENT RIGHT EYE;  Surgeon: Fabio Pierce, MD;  Location: [facility];  Service: Ophthalmology;  Laterality: Right;  CDE: 7.07   COLONOSCOPY  02/2010   patchy erythema, minimal granularity in distal rectum, left-sided diverticula, ileal diverticulum.  Bx benign. next TCS 02/2012   COLONOSCOPY  03/24/2012   Dr. Jena Gauss- inflammatory changes of the rectum and colon consistent with history of U/C. bx= benign colonic mucosa   COLONOSCOPY N/A 10/05/2014   RMR: abnormal rectal and sigmoid mucosa as described above status post segmental biopsy. active colitis sigmoid colon.   COLONOSCOPY WITH PROPOFOL N/A 07/19/2017   Procedure: COLONOSCOPY WITH PROPOFOL;  Surgeon: Corbin Ade, MD;  [facility]: [facility];  Service: Endoscopy;  Laterality: N/A;  7:30am   cyst removed from urinary bladder     ESOPHAGOGASTRODUODENOSCOPY  04/2008   schatzki ring, s/p ED, hh   LUMBAR FUSION     SHOULDER ARTHROSCOPY W/ ROTATOR CUFF REPAIR Bilateral    TONSILLECTOMY      Family History   Problem Relation Age of Onset   Lung cancer Mother 34       deceased   Cancer Sister        thyroid   Arthritis Sister    Other Brother        Posttraumatic stress disorder   Cancer Brother        skin cancer   SIDS Brother    Huntington's disease Daughter    Colon cancer Neg Hx    Stroke Neg Hx       Controlled substance contract: n/a     Review of Systems  Constitutional:  Negative for diaphoresis.  Eyes:  Negative for pain.  Respiratory:  Negative for shortness of breath.   Cardiovascular:  Negative for chest pain, palpitations and leg swelling.  Gastrointestinal:  Negative for abdominal pain.  Endocrine: Negative for polydipsia.  Skin:  Negative for rash.  Neurological:  Negative for dizziness, weakness and headaches.  Hematological:  Does not bruise/bleed easily.  All other systems reviewed and are negative.      Objective:   Physical Exam Vitals and nursing note reviewed.  Constitutional:      Appearance: Normal appearance. He is well-developed.  HENT:     Head: Normocephalic.     Nose: Nose normal.     Mouth/Throat:     Mouth: Mucous membranes are moist.     Pharynx: Oropharynx is clear.  Eyes:     Pupils: Pupils are equal, round, and reactive to light.  Neck:     Thyroid: No thyroid mass or thyromegaly.     Vascular: No carotid bruit or JVD.     Trachea: Phonation normal.  Cardiovascular:     Rate and Rhythm: Normal rate and regular rhythm.  Pulmonary:     Effort: Pulmonary effort is normal. No respiratory distress.     Breath sounds: Normal breath sounds.  Abdominal:     General: Bowel sounds are normal.     Palpations: Abdomen is soft.     Tenderness: There is no abdominal tenderness.  Musculoskeletal:        General: Normal range of motion.     Cervical back: Normal range of motion and neck supple.  Lymphadenopathy:     Cervical: No cervical adenopathy.  Skin:    General: Skin is warm and dry.  Neurological:     Mental Status: He is  alert and oriented to person, place, and time.     Comments: Unable to sit steal. Constantly moving  Psychiatric:        Behavior: Behavior normal.        Thought Content: Thought content normal.        Judgment: Judgment normal.     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   Diagnosis and orders addressed:  1. Essential hypertension Low sodium diet  2. Permanent atrial fibrillation (HCC) Avoid caffeine  3. Ischemic stroke (HCC) Continue eliquis as prescribed  4. Hyperlipidemia with target LDL less than 100 Low fat diet  5. Gastroesophageal reflux disease, unspecified whether esophagitis present Avoid spicy foods Do not eat 2 hours prior to bedtime   6. Hypothyroidism due to acquired atrophy of thyroid Labs pending  7. Diverticulosis of large intestine without hemorrhage Watch diet to prevent flare up  8. Late effect of cerebrovascular accident (CVA)   9. Recurrent major depressive disorder, in full remission (HCC) stress management  10. Huntington's chorea (HCC) Keep follow up with specialist  11. Abnormality of gait Fall prevention  12. Memory deficits Continue to orient daily  13. BMI 24.0-24.9, adult Discussed diet and exercise for person with BMI >25 Will recheck weight in 3-6 months    Labs pending Health Maintenance reviewed Diet and exercise encouraged  Follow up plan: 3 months   Mary-Margaret  Daphine Deutscher, FNP

## 2023-04-05 NOTE — Patient Instructions (Signed)
Fall Prevention in the Home, Adult Falls can cause injuries and can happen to people of all ages. There are many things you can do to make your home safer and to help prevent falls. What actions can I take to prevent falls? General information Use good lighting in all rooms. Make sure to: Replace any light bulbs that burn out. Turn on the lights in dark areas and use night-lights. Keep items that you use often in easy-to-reach places. Lower the shelves around your home if needed. Move furniture so that there are clear paths around it. Do not use throw rugs or other things on the floor that can make you trip. If any of your floors are uneven, fix them. Add color or contrast paint or tape to clearly mark and help you see: Grab bars or handrails. First and last steps of staircases. Where the edge of each step is. If you use a ladder or stepladder: Make sure that it is fully opened. Do not climb a closed ladder. Make sure the sides of the ladder are locked in place. Have someone hold the ladder while you use it. Know where your pets are as you move through your home. What can I do in the bathroom?     Keep the floor dry. Clean up any water on the floor right away. Remove soap buildup in the bathtub or shower. Buildup makes bathtubs and showers slippery. Use non-skid mats or decals on the floor of the bathtub or shower. Attach bath mats securely with double-sided, non-slip rug tape. If you need to sit down in the shower, use a non-slip stool. Install grab bars by the toilet and in the bathtub and shower. Do not use towel bars as grab bars. What can I do in the bedroom? Make sure that you have a light by your bed that is easy to reach. Do not use any sheets or blankets on your bed that hang to the floor. Have a firm chair or bench with side arms that you can use for support when you get dressed. What can I do in the kitchen? Clean up any spills right away. If you need to reach something  above you, use a step stool with a grab bar. Keep electrical cords out of the way. Do not use floor polish or wax that makes floors slippery. What can I do with my stairs? Do not leave anything on the stairs. Make sure that you have a light switch at the top and the bottom of the stairs. Make sure that there are handrails on both sides of the stairs. Fix handrails that are broken or loose. Install non-slip stair treads on all your stairs if they do not have carpet. Avoid having throw rugs at the top or bottom of the stairs. Choose a carpet that does not hide the edge of the steps on the stairs. Make sure that the carpet is firmly attached to the stairs. Fix carpet that is loose or worn. What can I do on the outside of my home? Use bright outdoor lighting. Fix the edges of walkways and driveways and fix any cracks. Clear paths of anything that can make you trip, such as tools or rocks. Add color or contrast paint or tape to clearly mark and help you see anything that might make you trip as you walk through a door, such as a raised step or threshold. Trim any bushes or trees on paths to your home. Check to see if handrails are loose   or broken and that both sides of all steps have handrails. Install guardrails along the edges of any raised decks and porches. Have leaves, snow, or ice cleared regularly. Use sand, salt, or ice melter on paths if you live where there is ice and snow during the winter. Clean up any spills in your garage right away. This includes grease or oil spills. What other actions can I take? Review your medicines with your doctor. Some medicines can cause dizziness or changes in blood pressure, which increase your risk of falling. Wear shoes that: Have a low heel. Do not wear high heels. Have rubber bottoms and are closed at the toe. Feel good on your feet and fit well. Use tools that help you move around if needed. These include: Canes. Walkers. Scooters. Crutches. Ask  your doctor what else you can do to help prevent falls. This may include seeing a physical therapist to learn to do exercises to move better and get stronger. Where to find more information Centers for Disease Control and Prevention, [employer]AD[employer]: cdc.gov National Institute on Aging: nia.nih.gov National Institute on Aging: nia.nih.gov Contact a doctor if: You are afraid of falling at home. You feel weak, drowsy, or dizzy at home. You fall at home. Get help right away if you: Lose consciousness or have trouble moving after a fall. Have a fall that causes a head injury. These symptoms may be an emergency. Get help right away. Call 911. Do not wait to see if the symptoms will go away. Do not drive yourself to the hospital. This information is not intended to replace advice given to you by your health care provider. Make sure you discuss any questions you have with your health care provider. Document Revised: 04/06/2022 Document Reviewed: 04/06/2022 Elsevier Patient Education  2024 Elsevier Inc.  

## 2023-04-07 ENCOUNTER — Ambulatory Visit: Payer: Medicare Other | Admitting: Adult Health

## 2023-04-07 ENCOUNTER — Encounter: Payer: Self-pay | Admitting: Adult Health

## 2023-04-07 VITALS — BP 133/74 | HR 56 | Ht 66.0 in | Wt 135.0 lb

## 2023-04-07 DIAGNOSIS — G10 Huntington's disease: Secondary | ICD-10-CM | POA: Diagnosis not present

## 2023-04-07 NOTE — Patient Instructions (Signed)
Your Plan:  Continue Haldol      Thank you for coming to see [place] at [facility]. I hope we have been able to provide you high quality care today.  You may receive a patient satisfaction survey over the next few weeks. We would appreciate your feedback and comments so that we may continue to improve ourselves and the health of our patients.

## 2023-04-07 NOTE — Progress Notes (Signed)
PATIENT: Sergio Baker DOB: 1935-06-29  REASON FOR VISIT: follow up HISTORY FROM: patient PRIMARY NEUROLOGIST: Dr. Terrace Arabia  Chief Complaint  Patient presents with   Follow-up    Rm 4 with spouse Bonita Quin Pt is well, wife states he is having more trouble with speech due to tongue rolling in his mouth. She also mentions his gait has worsen.  Has had a recent fall      HISTORY OF PRESENT ILLNESS: Today 04/07/23:  Sergio Baker is a 88 y.o. male with a history of Huntington's disease. Returns today for follow-up. Remains on Haldol 1 mg daily. Doing well. Wife reports more tongue rolling. No falls. Uses a walker. No falls. Here today with his wife.   09/22/22: Sergio Baker is a 88 y.o. male who has been followed in this office for Huntington's disease and history of stroke. Returns today for follow-up.  He remains on Haldol 1 mg daily.  This seems to work the best for him.  There were questionable seizure-like events.  Patient is no longer taking Lamictal 100 mg twice a day.  Only has one event on 12/7 he had a spell- wife had to call EMS- patient told her that something was wrong but he couldn't describe it. Felt hot and had a mild headache. When EMS got there all there test was normal.  Has not had any additional episodes. Wife reports that morning he had an apple pie but had not eaten anything else since then.    HISTORY (copied from Dr. Terrace Arabia) Sergio Baker, is a 88 year old male, accompanied by his wife, follow-up for Huntington's disease, worsening gait abnormality,  I reviewed and summarized the referring note.PMHx. Atrial fibrillation, on eliquis HLD Depression HTN  He was seen by Dr. Anne Hahn since 2014, strong family history of Huntington's disease, 9 year old daughter just died of Huntington's disease, mother also had similar abnormal movement  More than 10 years ago, he developed slow worsening choreoathetotic movement, gradually getting worse, also developed gait  abnormality, mild memory loss, but no significant mood disorder, wife reported that he is very pleasant to be around, he was assumed to have diagnosis of Huntington's disease, but never had genetic testing  Personally reviewed MRI of the brain in March 2018, moderate atrophy, does have caved in caudate head, Repeat MRI of the brain at [facility] on October 4 for evaluation of dizziness, reported remote infarction in the right frontal lobe cortex left basal ganglion, uses MRI of the brain since 2018 but no acute process  He suffered stroke in 2022, with worsening chorea athetotic movement, mild residual left-sided weakness, gait abnormality,  Reviewed multiple evaluation by Dr. Anne Hahn in the past, has been treated with Haldol titrating dose, currently taking 1.5 mg twice a day for many years, was not sure about the benefit, I did notice that he has a tendency for tongue protrusion, chewing movement.  He presented with 3 episode of sudden onset dizziness since July 2023, wife described 1 episode, he was sitting on the sofa, suddenly felt dizzy, sweaty, confused, while wife attended him, he was gazing into space, not able to focus, lasting for few minutes, feels tired afterwards, there is no tonic-clonic movement I   REVIEW OF SYSTEMS: Out of a complete 14 system review of symptoms, the patient complains only of the following symptoms, and all other reviewed systems are negative.  ALLERGIES: No Known Allergies  HOME MEDICATIONS: Outpatient Medications Prior to Visit  Medication Sig Dispense Refill  acetaminophen (TYLENOL) 650 MG CR tablet Take 1,300 mg by mouth daily as needed for pain.     Aloe Vera 25 MG CAPS Take 25 mg by mouth daily.     apixaban (ELIQUIS) 5 MG TABS tablet TAKE ONE (1) TABLET BY MOUTH TWO (2) TIMES DAILY 60 tablet 5   atorvastatin (LIPITOR) 80 MG tablet Take 1 tablet (80 mg total) by mouth at bedtime. 90 tablet 1   Cholecalciferol (VITAMIN D3) 2000 UNITS capsule Take 2,000 Units  by mouth daily.     citalopram (CELEXA) 20 MG tablet Take 1 tablet (20 mg total) by mouth daily. 90 tablet 1   Glucosamine-Chondroit-Vit C-Mn (GLUCOSAMINE CHONDR 1500 COMPLX PO) Take 1 tablet by mouth daily.     haloperidol (HALDOL) 1 MG tablet Take 1.5 tablets (1.5 mg total) by mouth 2 (two) times daily. 270 tablet 1   levothyroxine (SYNTHROID) 50 MCG tablet Take 1 tablet (50 mcg total) by mouth daily before breakfast. 90 tablet 1   lisinopril-hydrochlorothiazide (ZESTORETIC) 20-12.5 MG tablet Take 1 tablet by mouth daily. 90 tablet 1   metoprolol succinate (TOPROL-XL) 25 MG 24 hr tablet Take 1 tablet (25 mg total) by mouth daily. 90 tablet 1   Multiple Vitamin (MULTIVITAMIN WITH MINERALS) TABS Take 1 tablet by mouth daily. For Men 50+     Omega-3 Fatty Acids (FISH OIL PO) Take 1 tablet by mouth daily.     omeprazole (PRILOSEC) 20 MG capsule Take 1 capsule (20 mg total) by mouth daily. 90 capsule 1   potassium chloride (KLOR-CON) 10 MEQ tablet TAKE ONE (1) TABLET BY MOUTH TWO (2) TIMES DAILY 180 tablet 0   No facility-administered medications prior to visit.    PAST MEDICAL HISTORY: Past Medical History:  Diagnosis Date   Abnormality of gait    Arthritis    Atrial fibrillation (HCC)    Depression    Diverticula of colon    Enlarged prostate    GERD (gastroesophageal reflux disease)    Hearing loss    Hemorrhoids    Hiatal hernia    Huntington's disease (HCC) 2010   Hyperlipidemia    Hypothyroidism    Memory deficits 01/17/2014   Proctocolitis, ulcerative (HCC)    Diagnosed in the 1980s   Schatzki's ring    Stroke (cerebrum) (HCC)    Ulcerative colitis (HCC)     PAST SURGICAL HISTORY: Past Surgical History:  Procedure Laterality Date   APPENDECTOMY     BIOPSY  07/19/2017   Procedure: BIOPSY;  Surgeon: Corbin Ade, MD;  Location: [facility];  Service: Endoscopy;;  colon   BRAIN SURGERY     CATARACT EXTRACTION W/PHACO Left 05/07/2017   Procedure: CATARACT EXTRACTION  PHACO AND INTRAOCULAR LENS PLACEMENT (IOC);  Surgeon: Fabio Pierce, MD;  Location: [facility];  Service: Ophthalmology;  Laterality: Left;  CDE: 10.49   CATARACT EXTRACTION W/PHACO Right 06/04/2017   Procedure: CATARACT EXTRACTION PHACO AND INTRAOCULAR LENS PLACEMENT RIGHT EYE;  Surgeon: Fabio Pierce, MD;  Location: [facility];  Service: Ophthalmology;  Laterality: Right;  CDE: 7.07   COLONOSCOPY  02/2010   patchy erythema, minimal granularity in distal rectum, left-sided diverticula, ileal diverticulum.  Bx benign. next TCS 02/2012   COLONOSCOPY  03/24/2012   Dr. Jena Gauss- inflammatory changes of the rectum and colon consistent with history of U/C. bx= benign colonic mucosa   COLONOSCOPY N/A 10/05/2014   RMR: abnormal rectal and sigmoid mucosa as described above status post segmental biopsy. active colitis sigmoid colon.  COLONOSCOPY WITH PROPOFOL N/A 07/19/2017   Procedure: COLONOSCOPY WITH PROPOFOL;  Surgeon: Corbin Ade, MD;  Location: [facility];  Service: Endoscopy;  Laterality: N/A;  7:30am   cyst removed from urinary bladder     ESOPHAGOGASTRODUODENOSCOPY  04/2008   schatzki ring, s/p ED, hh   LUMBAR FUSION     SHOULDER ARTHROSCOPY W/ ROTATOR CUFF REPAIR Bilateral    TONSILLECTOMY      FAMILY HISTORY: Family History  Problem Relation Age of Onset   Lung cancer Mother 25       deceased   Cancer Sister        thyroid   Arthritis Sister    Other Brother        Posttraumatic stress disorder   Cancer Brother        skin cancer   SIDS Brother    Huntington's disease Daughter    Colon cancer Neg Hx    Stroke Neg Hx     SOCIAL HISTORY: Social History   Socioeconomic History   Marital status: Married    Spouse name: Bonita Quin   Number of children: 1   Years of education: 12th   Highest education level: 12th grade  Occupational History   Occupation: retired Chartered certified accountant   Occupation: works as Engineer, materials at [employer]  Tobacco Use   Smoking status: Former    Current packs/day: 0.00     Average packs/day: 0.3 packs/day for 8.0 years (2.0 ttl pk-yrs)    Types: Cigarettes    Start date: 05/03/1965    Quit date: 05/03/1973    Years since quitting: 49.9   Smokeless tobacco: Never   Tobacco comments:    Quit 1970's   Vaping Use   Vaping status: Never Used  Substance and Sexual Activity   Alcohol use: No    Alcohol/week: 0.0 standard drinks of alcohol   Drug use: No   Sexual activity: Not on file  Other Topics Concern   Not on file  Social History Narrative   Patient lives at home with his wife.    Social Determinants of Health   Financial Resource Strain: Low Risk  (12/17/2022)   Overall Financial Resource Strain (CARDIA)    Difficulty of Paying Living Expenses: Not hard at all  Food Insecurity: No Food Insecurity (12/17/2022)   Hunger Vital Sign    Worried About Running Out of Food in the Last Year: Never true    Ran Out of Food in the Last Year: Never true  Transportation Needs: No Transportation Needs (12/17/2022)   PRAPARE - Administrator, Civil Service (Medical): No    Lack of Transportation (Non-Medical): No  Physical Activity: Inactive (12/17/2022)   Exercise Vital Sign    Days of Exercise per Week: 0 days    Minutes of Exercise per Session: 0 min  Stress: No Stress Concern Present (12/17/2022)   [employer] of Occupational Health - Occupational Stress Questionnaire    Feeling of Stress : Not at all  Social Connections: Moderately Integrated (12/17/2022)   Social Connection and Isolation Panel [NHANES]    Frequency of Communication with Friends and Family: More than three times a week    Frequency of Social Gatherings with Friends and Family: More than three times a week    Attends Religious Services: More than 4 times per year    Active Member of [employer] or Organizations: No    Attends Banker Meetings: Never    Marital Status: Married  Intimate  Partner Violence: Not At Risk (12/17/2022)   Humiliation, Afraid, Rape, and Kick  questionnaire    Fear of Current or Ex-Partner: No    Emotionally Abused: No    Physically Abused: No    Sexually Abused: No      PHYSICAL EXAM  Vitals:   04/07/23 1412  BP: 133/74  Pulse: (!) 56  Weight: 135 lb (61.2 kg)  Height: 5\' 6"  (1.676 m)   Body mass index is 21.79 kg/m.  Generalized: Well developed, in no acute distress   Neurological examination  Mentation: Alert oriented to time, place, history taking. Follows all commands. Speech dysarthric Cranial nerve II-XII: Pupils were equal round reactive to light. Extraocular movements were full, visual field were full on confrontational test. Facial sensation and strength were normal. Uvula tongue midline. Head turning and shoulder shrug  were normal and symmetric. Motor: The motor testing reveals 5 over 5 strength of all 4 extremities. Good symmetric motor tone is noted throughout.  Sensory: Sensory testing is intact to soft touch on all 4 extremities. No evidence of extinction is noted.  Coordination: Cerebellar testing reveals good finger-nose-finger and heel-to-shin bilaterally.  Gait and station: uses a walker when ambulating. Gait is wide based, slightly unsteady.    DIAGNOSTIC DATA (LABS, IMAGING, TESTING) - I reviewed patient records, labs, notes, testing and imaging myself where available.  Lab Results  Component Value Date   WBC 7.1 04/01/2023   HGB 14.1 04/01/2023   HCT 43.1 04/01/2023   MCV 98 (H) 04/01/2023   PLT 178 04/01/2023      Component Value Date/Time   NA 143 04/01/2023 0929   K 3.7 04/01/2023 0929   CL 99 04/01/2023 0929   CO2 27 04/01/2023 0929   GLUCOSE 97 04/01/2023 0929   GLUCOSE 89 07/14/2017 1314   BUN 12 04/01/2023 0929   CREATININE 0.79 04/01/2023 0929   CREATININE 0.90 12/26/2012 0907   CALCIUM 8.9 04/01/2023 0929   PROT 6.8 04/01/2023 0929   ALBUMIN 4.4 04/01/2023 0929   AST 29 04/01/2023 0929   ALT 19 04/01/2023 0929   ALKPHOS 86 04/01/2023 0929   BILITOT 1.2 04/01/2023  0929   GFRNONAA 82 08/01/2020 0950   GFRNONAA 83 12/26/2012 0907   GFRAA 95 08/01/2020 0950   GFRAA >89 12/26/2012 0907   Lab Results  Component Value Date   CHOL 113 04/01/2023   HDL 44 04/01/2023   LDLCALC 53 04/01/2023   TRIG 82 04/01/2023   CHOLHDL 2.6 04/01/2023   Lab Results  Component Value Date   HGBA1C 6.4 (H) 04/01/2023   Lab Results  Component Value Date   VITAMINB12 607 11/21/2012   Lab Results  Component Value Date   TSH 4.580 (H) 12/25/2022      ASSESSMENT AND PLAN 88 y.o. year old male  has a past medical history of Abnormality of gait, Arthritis, Atrial fibrillation (HCC), Depression, Diverticula of colon, Enlarged prostate, GERD (gastroesophageal reflux disease), Hearing loss, Hemorrhoids, Hiatal hernia, Huntington's disease (HCC) (2010), Hyperlipidemia, Hypothyroidism, Memory deficits (01/17/2014), Proctocolitis, ulcerative (HCC), Schatzki's ring, Stroke (cerebrum) (HCC), and Ulcerative colitis (HCC). here with:  1.  Huntington's disease  Continue Haldol 1 mg daily Continue to monitor symptoms   FU in 6 months or sooner if needed      Butch Penny, MSN, NP-C 04/07/2023, 2:16 PM [facility] [address] [place], [place] [address] [phone]

## 2023-05-06 ENCOUNTER — Ambulatory Visit (INDEPENDENT_AMBULATORY_CARE_PROVIDER_SITE_OTHER): Payer: Medicare Other

## 2023-05-06 DIAGNOSIS — Z23 Encounter for immunization: Secondary | ICD-10-CM

## 2023-05-26 DIAGNOSIS — L821 Other seborrheic keratosis: Secondary | ICD-10-CM | POA: Diagnosis not present

## 2023-05-26 DIAGNOSIS — Z85828 Personal history of other malignant neoplasm of skin: Secondary | ICD-10-CM | POA: Diagnosis not present

## 2023-06-28 ENCOUNTER — Other Ambulatory Visit: Payer: Self-pay | Admitting: Nurse Practitioner

## 2023-06-28 ENCOUNTER — Other Ambulatory Visit: Payer: Medicare Other

## 2023-06-28 DIAGNOSIS — R7309 Other abnormal glucose: Secondary | ICD-10-CM | POA: Diagnosis not present

## 2023-06-28 DIAGNOSIS — I10 Essential (primary) hypertension: Secondary | ICD-10-CM

## 2023-06-28 DIAGNOSIS — E785 Hyperlipidemia, unspecified: Secondary | ICD-10-CM

## 2023-06-28 DIAGNOSIS — G10 Huntington's disease: Secondary | ICD-10-CM

## 2023-06-28 LAB — BAYER DCA HB A1C WAIVED: HB A1C (BAYER DCA - WAIVED): 5.9 % — ABNORMAL HIGH (ref 4.8–5.6)

## 2023-06-29 LAB — CBC WITH DIFFERENTIAL/PLATELET
Basophils Absolute: 0 10*3/uL (ref 0.0–0.2)
Basos: 1 %
EOS (ABSOLUTE): 0.2 10*3/uL (ref 0.0–0.4)
Eos: 3 %
Hematocrit: 40.6 % (ref 37.5–51.0)
Hemoglobin: 13.2 g/dL (ref 13.0–17.7)
Immature Grans (Abs): 0 10*3/uL (ref 0.0–0.1)
Immature Granulocytes: 0 %
Lymphocytes Absolute: 1.4 10*3/uL (ref 0.7–3.1)
Lymphs: 23 %
MCH: 33.5 pg — ABNORMAL HIGH (ref 26.6–33.0)
MCHC: 32.5 g/dL (ref 31.5–35.7)
MCV: 103 fL — ABNORMAL HIGH (ref 79–97)
Monocytes Absolute: 0.6 10*3/uL (ref 0.1–0.9)
Monocytes: 10 %
Neutrophils Absolute: 3.8 10*3/uL (ref 1.4–7.0)
Neutrophils: 63 %
Platelets: 170 10*3/uL (ref 150–450)
RBC: 3.94 x10E6/uL — ABNORMAL LOW (ref 4.14–5.80)
RDW: 12.5 % (ref 11.6–15.4)
WBC: 6 10*3/uL (ref 3.4–10.8)

## 2023-06-29 LAB — LIPID PANEL
Chol/HDL Ratio: 2.7 ratio (ref 0.0–5.0)
Cholesterol, Total: 101 mg/dL (ref 100–199)
HDL: 37 mg/dL — ABNORMAL LOW (ref >39)
LDL Chol Calc (NIH): 48 mg/dL (ref 0–99)
Triglycerides: 76 mg/dL (ref 0–149)
VLDL Cholesterol Cal: 16 mg/dL (ref 5–40)

## 2023-06-29 LAB — CMP14+EGFR
ALT: 20 IU/L (ref 0–44)
AST: 31 IU/L (ref 0–40)
Albumin: 4.3 g/dL (ref 3.7–4.7)
Alkaline Phosphatase: 76 IU/L (ref 44–121)
BUN/Creatinine Ratio: 16 (ref 10–24)
BUN: 13 mg/dL (ref 8–27)
Bilirubin Total: 1.2 mg/dL (ref 0.0–1.2)
CO2: 26 mmol/L (ref 20–29)
Calcium: 8 mg/dL — ABNORMAL LOW (ref 8.6–10.2)
Chloride: 101 mmol/L (ref 96–106)
Creatinine, Ser: 0.83 mg/dL (ref 0.76–1.27)
Globulin, Total: 2.1 g/dL (ref 1.5–4.5)
Glucose: 85 mg/dL (ref 70–99)
Potassium: 3.6 mmol/L (ref 3.5–5.2)
Sodium: 144 mmol/L (ref 134–144)
Total Protein: 6.4 g/dL (ref 6.0–8.5)
eGFR: 85 mL/min/{1.73_m2} (ref >59)

## 2023-07-02 ENCOUNTER — Ambulatory Visit (INDEPENDENT_AMBULATORY_CARE_PROVIDER_SITE_OTHER): Payer: Medicare Other | Admitting: Nurse Practitioner

## 2023-07-02 VITALS — BP 124/64 | HR 53 | Temp 97.2°F | Resp 20 | Ht 66.0 in | Wt 132.0 lb

## 2023-07-02 DIAGNOSIS — R413 Other amnesia: Secondary | ICD-10-CM | POA: Diagnosis not present

## 2023-07-02 DIAGNOSIS — Z6824 Body mass index (BMI) 24.0-24.9, adult: Secondary | ICD-10-CM

## 2023-07-02 DIAGNOSIS — I10 Essential (primary) hypertension: Secondary | ICD-10-CM

## 2023-07-02 DIAGNOSIS — K219 Gastro-esophageal reflux disease without esophagitis: Secondary | ICD-10-CM

## 2023-07-02 DIAGNOSIS — E034 Atrophy of thyroid (acquired): Secondary | ICD-10-CM

## 2023-07-02 DIAGNOSIS — F3342 Major depressive disorder, recurrent, in full remission: Secondary | ICD-10-CM

## 2023-07-02 DIAGNOSIS — G10 Huntington's disease: Secondary | ICD-10-CM

## 2023-07-02 DIAGNOSIS — E785 Hyperlipidemia, unspecified: Secondary | ICD-10-CM

## 2023-07-02 DIAGNOSIS — N400 Enlarged prostate without lower urinary tract symptoms: Secondary | ICD-10-CM | POA: Diagnosis not present

## 2023-07-02 DIAGNOSIS — I4821 Permanent atrial fibrillation: Secondary | ICD-10-CM | POA: Diagnosis not present

## 2023-07-02 DIAGNOSIS — I6930 Unspecified sequelae of cerebral infarction: Secondary | ICD-10-CM

## 2023-07-02 DIAGNOSIS — K5730 Diverticulosis of large intestine without perforation or abscess without bleeding: Secondary | ICD-10-CM | POA: Diagnosis not present

## 2023-07-02 MED ORDER — METOPROLOL SUCCINATE ER 25 MG PO TB24: 25.0000 mg | ORAL_TABLET | Freq: Every day | ORAL | 1 refills | Status: DC

## 2023-07-02 MED ORDER — HALOPERIDOL 1 MG PO TABS
1.5000 mg | ORAL_TABLET | Freq: Two times a day (BID) | ORAL | 1 refills | Status: DC
Start: 2023-07-02 — End: 2023-12-16

## 2023-07-02 MED ORDER — LEVOTHYROXINE SODIUM 50 MCG PO TABS: 50.0000 ug | ORAL_TABLET | Freq: Every day | ORAL | 1 refills | Status: DC

## 2023-07-02 MED ORDER — LISINOPRIL-HYDROCHLOROTHIAZIDE 20-12.5 MG PO TABS
1.0000 | ORAL_TABLET | Freq: Every day | ORAL | 1 refills | Status: DC
Start: 2023-07-02 — End: 2023-12-16

## 2023-07-02 MED ORDER — APIXABAN 5 MG PO TABS
ORAL_TABLET | ORAL | 5 refills | Status: DC
Start: 2023-07-02 — End: 2023-12-09

## 2023-07-02 MED ORDER — CITALOPRAM HYDROBROMIDE 20 MG PO TABS: 20.0000 mg | ORAL_TABLET | Freq: Every day | ORAL | 1 refills | Status: DC

## 2023-07-02 MED ORDER — ATORVASTATIN CALCIUM 80 MG PO TABS: 80.0000 mg | ORAL_TABLET | Freq: Every day | ORAL | 1 refills | Status: DC

## 2023-07-02 MED ORDER — OMEPRAZOLE 20 MG PO CPDR
20.0000 mg | DELAYED_RELEASE_CAPSULE | Freq: Every day | ORAL | 1 refills | Status: DC
Start: 2023-07-02 — End: 2023-12-16

## 2023-07-02 NOTE — Progress Notes (Signed)
Subjective:    Patient ID: Sergio Baker, male    DOB: 08-05-1935, 88 y.o.   MRN: [id number]   Chief Complaint: medical management of chronic issues     HPI:  Sergio Baker is a 88 y.o. who identifies as a male who was assigned male at birth.   Social history: Lives with: wife Work history: retired   Water engineer in today for follow up of the following chronic medical issues:  1. Essential hypertension No c/o chest pain, sob or headache. Does not check blood pressure at home. BP Readings from Last 3 Encounters:  04/07/23 133/74  04/05/23 112/60  03/31/23 128/62     2. Permanent atrial fibrillation (HCC) Denies palpitations or heart racing  3. Gastroesophageal reflux disease, unspecified whether esophagitis present Is on omperazole and has no c/o symptoms  4. Hypothyroidism due to acquired atrophy of thyroid No issues that aware of Lab Results  Component Value Date   TSH 4.580 (H) 12/25/2022     5. Hyperlipidemia with target LDL less than 100 Eats whatever his wife fixes him to eat Lab Results  Component Value Date   CHOL 101 06/28/2023   HDL 37 (L) 06/28/2023   LDLCALC 48 06/28/2023   TRIG 76 06/28/2023   CHOLHDL 2.7 06/28/2023     6. Late effect of cerebrovascular accident (CVA) No permanent effects  7. Recurrent major depressive disorder, in full remission (HCC) Has been on celexa for awhile and is doing well    07/02/2023    2:07 PM 04/05/2023    2:17 PM 12/17/2022   10:02 AM  Depression screen PHQ 2/9  Decreased Interest 0 0 0  Down, Depressed, Hopeless 0 0 0  PHQ - 2 Score 0 0 0  Altered sleeping 0 0   Tired, decreased energy 0 0   Change in appetite 0 0   Feeling bad or failure about yourself  0 0   Trouble concentrating 0 0   Moving slowly or fidgety/restless 0 0   Suicidal thoughts 0 0   PHQ-9 Score 0 0   Difficult doing work/chores Not difficult at all Not difficult at all       07/02/2023    2:07 PM 04/05/2023    2:18 PM 08/27/2022     2:40 PM 06/08/2022    3:02 PM  GAD 7 : Generalized Anxiety Score  Nervous, Anxious, on Edge 0 0 0 0  Control/stop worrying 0 0 0 0  Worry too much - different things 0 0 0 0  Trouble relaxing 0 0 0 0  Restless 0 0 0 0  Easily annoyed or irritable 0 0 0 0  Afraid - awful might happen 0 0 0 0  Total GAD 7 Score 0 0 0 0  Anxiety Difficulty Not difficult at all Not difficult at all Not difficult at all Not difficult at all       8. Diverticulosis of large intestine without hemorrhage No recent flare ups  9. Benign prostatic hyperplasia without lower urinary tract symptoms No voiding issues  10. Huntington's chorea (HCC) Is on haldol.sees neurology every 6 months  11. Memory deficits Wife says he repeats hisself a lot and is very forgetful. Still able to recognize people he sees on a regular basis  12. BMI 24.0-24.9, adult No recent weight changes Wt Readings from Last 3 Encounters:  07/02/23 132 lb (59.9 kg)  04/07/23 135 lb (61.2 kg)  04/05/23 136 lb (61.7 kg)   BMI Readings  from Last 3 Encounters:  07/02/23 21.31 kg/m  04/07/23 21.79 kg/m  04/05/23 21.95 kg/m      New complaints: None today  No Known Allergies Outpatient Encounter Medications as of 07/02/2023  Medication Sig   acetaminophen (TYLENOL) 650 MG CR tablet Take 1,300 mg by mouth daily as needed for pain.   Aloe Vera 25 MG CAPS Take 25 mg by mouth daily.   apixaban (ELIQUIS) 5 MG TABS tablet TAKE ONE (1) TABLET BY MOUTH TWO (2) TIMES DAILY   atorvastatin (LIPITOR) 80 MG tablet Take 1 tablet (80 mg total) by mouth at bedtime.   Cholecalciferol (VITAMIN D3) 2000 UNITS capsule Take 2,000 Units by mouth daily.   citalopram (CELEXA) 20 MG tablet Take 1 tablet (20 mg total) by mouth daily.   Glucosamine-Chondroit-Vit C-Mn (GLUCOSAMINE CHONDR 1500 COMPLX PO) Take 1 tablet by mouth daily.   haloperidol (HALDOL) 1 MG tablet Take 1.5 tablets (1.5 mg total) by mouth 2 (two) times daily.   levothyroxine  (SYNTHROID) 50 MCG tablet Take 1 tablet (50 mcg total) by mouth daily before breakfast.   lisinopril-hydrochlorothiazide (ZESTORETIC) 20-12.5 MG tablet Take 1 tablet by mouth daily.   metoprolol succinate (TOPROL-XL) 25 MG 24 hr tablet Take 1 tablet (25 mg total) by mouth daily.   Multiple Vitamin (MULTIVITAMIN WITH MINERALS) TABS Take 1 tablet by mouth daily. For Men 50+   Omega-3 Fatty Acids (FISH OIL PO) Take 1 tablet by mouth daily.   omeprazole (PRILOSEC) 20 MG capsule Take 1 capsule (20 mg total) by mouth daily.   potassium chloride (KLOR-CON) 10 MEQ tablet TAKE ONE (1) TABLET BY MOUTH TWO (2) TIMES DAILY   No facility-administered encounter medications on file as of 07/02/2023.    Past Surgical History:  Procedure Laterality Date   APPENDECTOMY     BIOPSY  07/19/2017   Procedure: BIOPSY;  Surgeon: Corbin Ade, MD;  Location: [facility];  Service: Endoscopy;;  colon   BRAIN SURGERY     CATARACT EXTRACTION W/PHACO Left 05/07/2017   Procedure: CATARACT EXTRACTION PHACO AND INTRAOCULAR LENS PLACEMENT (IOC);  Surgeon: Fabio Pierce, MD;  Location: [facility];  Service: Ophthalmology;  Laterality: Left;  CDE: 10.49   CATARACT EXTRACTION W/PHACO Right 06/04/2017   Procedure: CATARACT EXTRACTION PHACO AND INTRAOCULAR LENS PLACEMENT RIGHT EYE;  Surgeon: Fabio Pierce, MD;  Location: [facility];  Service: Ophthalmology;  Laterality: Right;  CDE: 7.07   COLONOSCOPY  02/2010   patchy erythema, minimal granularity in distal rectum, left-sided diverticula, ileal diverticulum.  Bx benign. next TCS 02/2012   COLONOSCOPY  03/24/2012   Dr. Jena Gauss- inflammatory changes of the rectum and colon consistent with history of U/C. bx= benign colonic mucosa   COLONOSCOPY N/A 10/05/2014   RMR: abnormal rectal and sigmoid mucosa as described above status post segmental biopsy. active colitis sigmoid colon.   COLONOSCOPY WITH PROPOFOL N/A 07/19/2017   Procedure: COLONOSCOPY WITH PROPOFOL;  Surgeon: Corbin Ade, MD;   Location: [facility];  Service: Endoscopy;  Laterality: N/A;  7:30am   cyst removed from urinary bladder     ESOPHAGOGASTRODUODENOSCOPY  04/2008   schatzki ring, s/p ED, hh   LUMBAR FUSION     SHOULDER ARTHROSCOPY W/ ROTATOR CUFF REPAIR Bilateral    TONSILLECTOMY      Family History  Problem Relation Age of Onset   Lung cancer Mother 34       deceased   Cancer Sister        thyroid  Arthritis Sister    Other Brother        Posttraumatic stress disorder   Cancer Brother        skin cancer   SIDS Brother    Huntington's disease Daughter    Colon cancer Neg Hx    Stroke Neg Hx       Controlled substance contract: n/a     Review of Systems  Constitutional:  Negative for diaphoresis.  Eyes:  Negative for pain.  Respiratory:  Negative for shortness of breath.   Cardiovascular:  Negative for chest pain, palpitations and leg swelling.  Gastrointestinal:  Negative for abdominal pain.  Endocrine: Negative for polydipsia.  Skin:  Negative for rash.  Neurological:  Negative for dizziness, weakness and headaches.  Hematological:  Does not bruise/bleed easily.  All other systems reviewed and are negative.      Objective:   Physical Exam Vitals and nursing note reviewed.  Constitutional:      Appearance: Normal appearance. He is well-developed.  HENT:     Head: Normocephalic.     Nose: Nose normal.     Mouth/Throat:     Mouth: Mucous membranes are moist.     Pharynx: Oropharynx is clear.  Eyes:     Pupils: Pupils are equal, round, and reactive to light.  Neck:     Thyroid: No thyroid mass or thyromegaly.     Vascular: No carotid bruit or JVD.     Trachea: Phonation normal.  Cardiovascular:     Rate and Rhythm: Normal rate and regular rhythm.  Pulmonary:     Effort: Pulmonary effort is normal. No respiratory distress.     Breath sounds: Normal breath sounds.  Abdominal:     General: Bowel sounds are normal.     Palpations: Abdomen is soft.     Tenderness:  There is no abdominal tenderness.  Musculoskeletal:        General: Normal range of motion.     Cervical back: Normal range of motion and neck supple.  Lymphadenopathy:     Cervical: No cervical adenopathy.  Skin:    General: Skin is warm and dry.  Neurological:     Mental Status: He is alert and oriented to person, place, and time.  Psychiatric:        Behavior: Behavior normal.        Thought Content: Thought content normal.        Judgment: Judgment normal.    BP 124/64   Pulse (!) 53   Temp (!) 97.2 F (36.2 C) (Temporal)   Resp 20   Ht 5\' 6"  (1.676 m)   Wt 132 lb (59.9 kg)   SpO2 98%   BMI 21.31 kg/m         Assessment & Plan:   Sergio Baker comes in today with chief complaint of Medical Management of Chronic Issues   Diagnosis and orders addressed:  1. Essential hypertension Low sodium diet - lisinopril-hydrochlorothiazide (ZESTORETIC) 20-12.5 MG tablet; Take 1 tablet by mouth daily.  Dispense: 90 tablet; Refill: 1  2. Permanent atrial fibrillation (HCC) Avoid caffeine - apixaban (ELIQUIS) 5 MG TABS tablet; TAKE ONE (1) TABLET BY MOUTH TWO (2) TIMES DAILY  Dispense: 60 tablet; Refill: 5 - metoprolol succinate (TOPROL-XL) 25 MG 24 hr tablet; Take 1 tablet (25 mg total) by mouth daily.  Dispense: 90 tablet; Refill: 1  3. Gastroesophageal reflux disease, unspecified whether esophagitis present Avoid spicy foods Do not eat 2 hours prior to bedtime -  omeprazole (PRILOSEC) 20 MG capsule; Take 1 capsule (20 mg total) by mouth daily.  Dispense: 90 capsule; Refill: 1  4. Hypothyroidism due to acquired atrophy of thyroid Labs pending - levothyroxine (SYNTHROID) 50 MCG tablet; Take 1 tablet (50 mcg total) by mouth daily before breakfast.  Dispense: 90 tablet; Refill: 1  5. Hyperlipidemia with target LDL less than 100 Low fat diet - atorvastatin (LIPITOR) 80 MG tablet; Take 1 tablet (80 mg total) by mouth at bedtime.  Dispense: 90 tablet; Refill: 1  6. Late  effect of cerebrovascular accident (CVA)  7. Recurrent major depressive disorder, in full remission (HCC) Stress management - citalopram (CELEXA) 20 MG tablet; Take 1 tablet (20 mg total) by mouth daily.  Dispense: 90 tablet; Refill: 1  8. Diverticulosis of large intestine without hemorrhage Watch diet to prevent flare up  9. Benign prostatic hyperplasia without lower urinary tract symptoms Report any voiding issues  10. Huntington's chorea (HCC) Fall prevention - haloperidol (HALDOL) 1 MG tablet; Take 1.5 tablets (1.5 mg total) by mouth 2 (two) times daily.  Dispense: 270 tablet; Refill: 1  11. Memory deficits [place] daily  12. BMI 24.0-24.9, adult Discussed diet and exercise for person with BMI >25 Will recheck weight in 3-6 months    Labs pending Health Maintenance reviewed Diet and exercise encouraged  Follow up plan: 6 months   Mary-Margaret Daphine Deutscher, FNP

## 2023-09-27 ENCOUNTER — Other Ambulatory Visit: Payer: Self-pay | Admitting: Nurse Practitioner

## 2023-10-07 ENCOUNTER — Ambulatory Visit: Payer: Medicare Other | Admitting: Cardiology

## 2023-11-04 DIAGNOSIS — M25562 Pain in left knee: Secondary | ICD-10-CM | POA: Diagnosis not present

## 2023-11-04 DIAGNOSIS — M25561 Pain in right knee: Secondary | ICD-10-CM | POA: Diagnosis not present

## 2023-11-04 DIAGNOSIS — M170 Bilateral primary osteoarthritis of knee: Secondary | ICD-10-CM | POA: Diagnosis not present

## 2023-11-15 ENCOUNTER — Ambulatory Visit: Payer: Medicare Other | Admitting: Adult Health

## 2023-12-09 ENCOUNTER — Encounter: Payer: Self-pay | Admitting: Cardiology

## 2023-12-09 ENCOUNTER — Ambulatory Visit: Payer: Medicare Other | Attending: Cardiology | Admitting: Cardiology

## 2023-12-09 VITALS — BP 124/62 | HR 52 | Ht 66.0 in | Wt 128.0 lb

## 2023-12-09 DIAGNOSIS — Z8673 Personal history of transient ischemic attack (TIA), and cerebral infarction without residual deficits: Secondary | ICD-10-CM | POA: Diagnosis not present

## 2023-12-09 DIAGNOSIS — I4821 Permanent atrial fibrillation: Secondary | ICD-10-CM

## 2023-12-09 DIAGNOSIS — E782 Mixed hyperlipidemia: Secondary | ICD-10-CM | POA: Diagnosis not present

## 2023-12-09 DIAGNOSIS — I10 Essential (primary) hypertension: Secondary | ICD-10-CM | POA: Diagnosis not present

## 2023-12-09 MED ORDER — APIXABAN 2.5 MG PO TABS: 2.5000 mg | ORAL_TABLET | Freq: Two times a day (BID) | ORAL | 6 refills | Status: DC

## 2023-12-09 NOTE — Patient Instructions (Addendum)
 Medication Instructions:  Your physician has recommended you make the following change in your medication:  Stop metoprolol  succinate. Decrease eliquis  to 2.5 mg twice daily Continue all other medications as prescribed  Labwork: none  Testing/Procedures: none  Follow-Up: Your physician recommends that you schedule a follow-up appointment in: 6 months  Any Other Special Instructions Will Be Listed Below (If Applicable).  If you need a refill on your cardiac medications before your next appointment, please call your pharmacy.

## 2023-12-09 NOTE — Progress Notes (Signed)
    Cardiology Office Note  Date: 12/09/2023   ID: Sergio Baker, DOB [birth year], MRN [id number]  History of Present Illness: Sergio Baker is an 89 y.o. male last seen in August 2024.  He is here today with his wife for a follow-up visit.  He does not report any sense of palpitations.  His wife tells me that he sleeps a fair bit during the day.  Continues to use a walker with chronic balance concerns in the setting of Huntington's disease and previous stroke.  He has had some falls.  We went over his medications.  We discussed reduction in Eliquis  dose to 2.5 mg twice daily based on age and recent weights.  Also clarified discontinuation of Toprol -XL given heart rates in the 40s to 50s and atrial fibrillation.  I went over his interval lab work.  I reviewed his ECG today which shows slow atrial fibrillation at 44 bpm, rightward axis, poor R wave progression.  Physical Exam: VS:  BP 124/62   Pulse (!) 52   Ht 5\' 6"  (1.676 m)   Wt 128 lb (58.1 kg)   SpO2 96%   BMI 20.66 kg/m , BMI Body mass index is 20.66 kg/m.  Wt Readings from Last 3 Encounters:  12/09/23 128 lb (58.1 kg)  07/02/23 132 lb (59.9 kg)  04/07/23 135 lb (61.2 kg)    General: Patient appears comfortable at rest. HEENT: Conjunctiva and lids normal. Neck: Supple, no elevated JVP or carotid bruits. Lungs: Clear to auscultation, nonlabored breathing at rest. Cardiac: Irregularly irregular, 1/6 systolic murmur. Extremities: No pitting edema.  ECG:  An ECG dated 09/23/2022 was personally reviewed today and demonstrated:  Atrial fibrillation with decreased R wave progression.  Labwork: 12/25/2022: TSH 4.580 06/28/2023: ALT 20; AST 31; BUN 13; Creatinine, Ser 0.83; Hemoglobin 13.2; Platelets 170; Potassium 3.6; Sodium 144     Component Value Date/Time   CHOL 101 06/28/2023 1035   CHOL 143 12/26/2012 0907   TRIG 76 06/28/2023 1035   TRIG 122 10/25/2014 1056   TRIG 170 (H) 12/26/2012 0907   HDL 37 (L) 06/28/2023 1035    HDL 37 (L) 10/25/2014 1056   HDL 40 12/26/2012 0907   CHOLHDL 2.7 06/28/2023 1035   LDLCALC 48 06/28/2023 1035   LDLCALC 109 (H) 01/18/2014 1027   LDLCALC 69 12/26/2012 4098   Other Studies Reviewed Today:  No interval cardiac testing for review today.  Assessment and Plan:  1.  Permanent atrial fibrillation with CHA2DS2-VASc score of 3-4.  Plan to reduce Eliquis  to 2.5 mg twice daily as discussed above.  Clarified medications with pharmacy, reinforced that he should stop Toprol -XL going forward.   2.  Primary hypertension.  Blood pressure well-controlled today.  He continues on Zestoretic  20/12.5 mg daily with potassium supplement.   3.  History of stroke.   4.  Mixed hyperlipidemia.  LDL 48 in November 2024.  Continue Lipitor 80 mg daily.  Disposition:  Follow up  6 months.  Signed, Gerard Knight, M.D., F.A.C.C. [facility]

## 2023-12-13 ENCOUNTER — Telehealth: Payer: Self-pay | Admitting: Nurse Practitioner

## 2023-12-13 DIAGNOSIS — H9193 Unspecified hearing loss, bilateral: Secondary | ICD-10-CM

## 2023-12-13 NOTE — Telephone Encounter (Signed)
 Copied from CRM [id number]. Topic: General - Other >> Dec 13, 2023  2:14 PM Felizardo Hotter wrote: Reason for CRM: Pt's wife called on behalf of pt wanting a ENT referral. She stated he does not want to come in for appt. Please call pt at [phone].

## 2023-12-13 NOTE — Telephone Encounter (Signed)
 Called and spoke to wife. She would like husband to see an ENT for hearing issues. Referral placed. She also requested that her appt and her husbands appt be moved up and appt was made this week.

## 2023-12-16 ENCOUNTER — Encounter: Payer: Self-pay | Admitting: Nurse Practitioner

## 2023-12-16 ENCOUNTER — Ambulatory Visit (INDEPENDENT_AMBULATORY_CARE_PROVIDER_SITE_OTHER): Admitting: Nurse Practitioner

## 2023-12-16 VITALS — BP 124/64 | HR 61 | Temp 97.1°F | Ht 66.0 in | Wt 126.0 lb

## 2023-12-16 DIAGNOSIS — Z6824 Body mass index (BMI) 24.0-24.9, adult: Secondary | ICD-10-CM | POA: Diagnosis not present

## 2023-12-16 DIAGNOSIS — R739 Hyperglycemia, unspecified: Secondary | ICD-10-CM | POA: Diagnosis not present

## 2023-12-16 DIAGNOSIS — H6123 Impacted cerumen, bilateral: Secondary | ICD-10-CM

## 2023-12-16 DIAGNOSIS — N400 Enlarged prostate without lower urinary tract symptoms: Secondary | ICD-10-CM

## 2023-12-16 DIAGNOSIS — K219 Gastro-esophageal reflux disease without esophagitis: Secondary | ICD-10-CM | POA: Diagnosis not present

## 2023-12-16 DIAGNOSIS — F3342 Major depressive disorder, recurrent, in full remission: Secondary | ICD-10-CM

## 2023-12-16 DIAGNOSIS — E034 Atrophy of thyroid (acquired): Secondary | ICD-10-CM

## 2023-12-16 DIAGNOSIS — R413 Other amnesia: Secondary | ICD-10-CM

## 2023-12-16 DIAGNOSIS — H1011 Acute atopic conjunctivitis, right eye: Secondary | ICD-10-CM

## 2023-12-16 DIAGNOSIS — I10 Essential (primary) hypertension: Secondary | ICD-10-CM

## 2023-12-16 DIAGNOSIS — Z8673 Personal history of transient ischemic attack (TIA), and cerebral infarction without residual deficits: Secondary | ICD-10-CM | POA: Diagnosis not present

## 2023-12-16 DIAGNOSIS — G10 Huntington's disease: Secondary | ICD-10-CM

## 2023-12-16 DIAGNOSIS — E785 Hyperlipidemia, unspecified: Secondary | ICD-10-CM

## 2023-12-16 LAB — BAYER DCA HB A1C WAIVED: HB A1C (BAYER DCA - WAIVED): 5.6 % (ref 4.8–5.6)

## 2023-12-16 LAB — LIPID PANEL

## 2023-12-16 MED ORDER — OLOPATADINE HCL 0.2 % OP SOLN: 1.0000 [drp] | Freq: Every day | OPHTHALMIC | 1 refills | Status: AC

## 2023-12-16 MED ORDER — ATORVASTATIN CALCIUM 80 MG PO TABS: 80.0000 mg | ORAL_TABLET | Freq: Every day | ORAL | 1 refills | Status: DC

## 2023-12-16 MED ORDER — LISINOPRIL-HYDROCHLOROTHIAZIDE 20-12.5 MG PO TABS: 1.0000 | ORAL_TABLET | Freq: Every day | ORAL | 1 refills | Status: DC

## 2023-12-16 MED ORDER — POTASSIUM CHLORIDE ER 10 MEQ PO TBCR: 10.0000 meq | EXTENDED_RELEASE_TABLET | Freq: Two times a day (BID) | ORAL | 1 refills | Status: DC

## 2023-12-16 MED ORDER — OMEPRAZOLE 20 MG PO CPDR: 20.0000 mg | DELAYED_RELEASE_CAPSULE | Freq: Every day | ORAL | 1 refills | Status: DC

## 2023-12-16 MED ORDER — HALOPERIDOL 1 MG PO TABS: 1.5000 mg | ORAL_TABLET | Freq: Two times a day (BID) | ORAL | 1 refills | Status: DC

## 2023-12-16 MED ORDER — LEVOTHYROXINE SODIUM 50 MCG PO TABS: 50.0000 ug | ORAL_TABLET | Freq: Every day | ORAL | 1 refills | Status: DC

## 2023-12-16 MED ORDER — CITALOPRAM HYDROBROMIDE 20 MG PO TABS: 20.0000 mg | ORAL_TABLET | Freq: Every day | ORAL | 1 refills | Status: DC

## 2023-12-16 NOTE — Progress Notes (Signed)
 Subjective:    Patient ID: Sergio Baker, male    DOB: [birth year], 89 y.o.   MRN: [id number]   Chief Complaint: medical management of chronic issues     HPI:  Sergio Baker is a 89 y.o. who identifies as a male who was assigned male at birth.   Social history: Lives with: wife Work history: retired   Water engineer in today for follow up of the following chronic medical issues:  1. Essential hypertension No c/o chest pain, sob or headache. Does not check blood pressure at home. BP Readings from Last 3 Encounters:  12/09/23 124/62  07/02/23 124/64  04/07/23 133/74     2. Permanent atrial fibrillation (HCC) Denies palpitations or heart racing  3. Gastroesophageal reflux disease, unspecified whether esophagitis present Is on omperazole and has no c/o symptoms  4. Hypothyroidism due to acquired atrophy of thyroid  No issues that aware of Lab Results  Component Value Date   TSH 4.580 (H) 12/25/2022     5. Hyperlipidemia with target LDL less than 100 Eats whatever his wife fixes him to eat Lab Results  Component Value Date   CHOL 101 06/28/2023   HDL 37 (L) 06/28/2023   LDLCALC 48 06/28/2023   TRIG 76 06/28/2023   CHOLHDL 2.7 06/28/2023     6. History of cerebrovascular accident (CVA) No permanent effects  7. Recurrent major depressive disorder, in full remission (HCC) Has been on celexa  for awhile and is doing well    12/16/2023    2:44 PM 07/02/2023    2:07 PM 04/05/2023    2:17 PM  Depression screen PHQ 2/9  Decreased Interest 0 0 0  Down, Depressed, Hopeless 0 0 0  PHQ - 2 Score 0 0 0  Altered sleeping  0 0  Tired, decreased energy  0 0  Change in appetite  0 0  Feeling bad or failure about yourself   0 0  Trouble concentrating  0 0  Moving slowly or fidgety/restless  0 0  Suicidal thoughts  0 0  PHQ-9 Score  0 0  Difficult doing work/chores  Not difficult at all Not difficult at all      12/16/2023    2:44 PM 07/02/2023    2:07 PM 04/05/2023     2:18 PM 08/27/2022    2:40 PM  GAD 7 : Generalized Anxiety Score  Nervous, Anxious, on Edge 0 0 0 0  Control/stop worrying 0 0 0 0  Worry too much - different things 0 0 0 0  Trouble relaxing 0 0 0 0  Restless 0 0 0 0  Easily annoyed or irritable 0 0 0 0  Afraid - awful might happen 0 0 0 0  Total GAD 7 Score 0 0 0 0  Anxiety Difficulty Not difficult at all Not difficult at all Not difficult at all Not difficult at all       8. Diverticulosis of large intestine without hemorrhage No recent flare ups  9. Benign prostatic hyperplasia without lower urinary tract symptoms No voiding issues  10. Huntington's chorea (HCC) Is on haldol .sees neurology every 6 months  11. Memory deficits Wife says he repeats hisself a lot and is very forgetful. Still able to recognize people he sees on a regular basis  12. BMI 24.0-24.9, adult No recent weight changes  Wt Readings from Last 3 Encounters:  12/16/23 126 lb (57.2 kg)  12/09/23 128 lb (58.1 kg)  07/02/23 132 lb (59.9 kg)   BMI  Readings from Last 3 Encounters:  12/16/23 20.34 kg/m  12/09/23 20.66 kg/m  07/02/23 21.31 kg/m       New complaints: Had a fall at [facility] and wainers office but did not break anything. - trouble hearing- needs ear schecked - righ teye red and sore.   No Known Allergies Outpatient Encounter Medications as of 12/16/2023  Medication Sig   acetaminophen  (TYLENOL ) 650 MG CR tablet Take 1,300 mg by mouth daily as needed for pain.   Aloe Vera 25 MG CAPS Take 25 mg by mouth daily.   apixaban  (ELIQUIS ) 2.5 MG TABS tablet Take 1 tablet (2.5 mg total) by mouth 2 (two) times daily.   atorvastatin  (LIPITOR) 80 MG tablet Take 1 tablet (80 mg total) by mouth at bedtime.   Cholecalciferol (VITAMIN D3) 2000 UNITS capsule Take 2,000 Units by mouth daily.   citalopram  (CELEXA ) 20 MG tablet Take 1 tablet (20 mg total) by mouth daily.   Glucosamine-Chondroit-Vit C-Mn (GLUCOSAMINE CHONDR 1500 COMPLX PO) Take 1 tablet  by mouth daily.   haloperidol  (HALDOL ) 1 MG tablet Take 1.5 tablets (1.5 mg total) by mouth 2 (two) times daily.   levothyroxine  (SYNTHROID ) 50 MCG tablet Take 1 tablet (50 mcg total) by mouth daily before breakfast.   lisinopril -hydrochlorothiazide  (ZESTORETIC ) 20-12.5 MG tablet Take 1 tablet by mouth daily.   Multiple Vitamin (MULTIVITAMIN WITH MINERALS) TABS Take 1 tablet by mouth daily. For Men 50+   Omega-3 Fatty Acids (FISH OIL PO) Take 1 tablet by mouth daily.   omeprazole  (PRILOSEC) 20 MG capsule Take 1 capsule (20 mg total) by mouth daily.   potassium chloride  (KLOR-CON ) 10 MEQ tablet TAKE ONE (1) TABLET BY MOUTH TWO (2) TIMES DAILY   No facility-administered encounter medications on file as of 12/16/2023.    Past Surgical History:  Procedure Laterality Date   APPENDECTOMY     BIOPSY  07/19/2017   Procedure: BIOPSY;  Surgeon: Suzette Espy, MD;  Location: [facility];  Service: Endoscopy;;  colon   BRAIN SURGERY     CATARACT EXTRACTION W/PHACO Left 05/07/2017   Procedure: CATARACT EXTRACTION PHACO AND INTRAOCULAR LENS PLACEMENT (IOC);  Surgeon: Tarri Farm, MD;  Location: [facility];  Service: Ophthalmology;  Laterality: Left;  CDE: 10.49   CATARACT EXTRACTION W/PHACO Right 06/04/2017   Procedure: CATARACT EXTRACTION PHACO AND INTRAOCULAR LENS PLACEMENT RIGHT EYE;  Surgeon: Tarri Farm, MD;  Location: [facility];  Service: Ophthalmology;  Laterality: Right;  CDE: 7.07   COLONOSCOPY  02/2010   patchy erythema, minimal granularity in distal rectum, left-sided diverticula, ileal diverticulum.  Bx benign. next TCS 02/2012   COLONOSCOPY  03/24/2012   Dr. Riley Cheadle- inflammatory changes of the rectum and colon consistent with history of U/C. bx= benign colonic mucosa   COLONOSCOPY N/A 10/05/2014   RMR: abnormal rectal and sigmoid mucosa as described above status post segmental biopsy. active colitis sigmoid colon.   COLONOSCOPY WITH PROPOFOL  N/A 07/19/2017   Procedure: COLONOSCOPY WITH PROPOFOL ;   Surgeon: Suzette Espy, MD;  Location: [facility];  Service: Endoscopy;  Laterality: N/A;  7:30am   cyst removed from urinary bladder     ESOPHAGOGASTRODUODENOSCOPY  04/2008   schatzki ring, s/p ED, hh   LUMBAR FUSION     SHOULDER ARTHROSCOPY W/ ROTATOR CUFF REPAIR Bilateral    TONSILLECTOMY      Family History  Problem Relation Age of Onset   Lung cancer Mother 81       deceased   Cancer Sister  thyroid    Arthritis Sister    Other Brother        Posttraumatic stress disorder   Cancer Brother        skin cancer   SIDS Brother    Huntington's disease Daughter    Colon cancer Neg Hx    Stroke Neg Hx       Controlled substance contract: n/a     Review of Systems  Constitutional:  Negative for diaphoresis.  Eyes:  Negative for pain.  Respiratory:  Negative for shortness of breath.   Cardiovascular:  Negative for chest pain, palpitations and leg swelling.  Gastrointestinal:  Negative for abdominal pain.  Endocrine: Negative for polydipsia.  Skin:  Negative for rash.  Neurological:  Negative for dizziness, weakness and headaches.  Hematological:  Does not bruise/bleed easily.  All other systems reviewed and are negative.      Objective:   Physical Exam Vitals and nursing note reviewed.  Constitutional:      Appearance: Normal appearance. He is well-developed.  HENT:     Head: Normocephalic.     Right Ear: There is impacted cerumen.     Left Ear: There is impacted cerumen.     Nose: Nose normal.     Mouth/Throat:     Mouth: Mucous membranes are moist.     Pharynx: Oropharynx is clear.  Eyes:     General:        Right eye: Discharge (watery) present.     Pupils: Pupils are equal, round, and reactive to light.     Comments: Right led edema and erythema  Neck:     Thyroid : No thyroid  mass or thyromegaly.     Vascular: No carotid bruit or JVD.     Trachea: Phonation normal.  Cardiovascular:     Rate and Rhythm: Normal rate and regular rhythm.   Pulmonary:     Effort: Pulmonary effort is normal. No respiratory distress.     Breath sounds: Normal breath sounds.  Abdominal:     General: Bowel sounds are normal.     Palpations: Abdomen is soft.     Tenderness: There is no abdominal tenderness.  Musculoskeletal:        General: Normal range of motion.     Cervical back: Normal range of motion and neck supple.     Comments: Walker with walker- gait slow and unsteady  Lymphadenopathy:     Cervical: No cervical adenopathy.  Skin:    General: Skin is warm and dry.  Neurological:     Mental Status: He is alert and oriented to person, place, and time.     Comments: Constant jerky movements of all limbs  Psychiatric:        Behavior: Behavior normal.        Thought Content: Thought content normal.        Judgment: Judgment normal.    BP 124/64   Pulse 61   Temp (!) 97.1 F (36.2 C) (Temporal)   Ht 5\' 6"  (1.676 m)   Wt 126 lb (57.2 kg)   SpO2 95%   BMI 20.34 kg/m   Bil ear irrigation- TMS normal        Assessment & Plan:   Sergio Baker comes in today with chief complaint of Medical Management of Chronic Issues   Diagnosis and orders addressed:  1. Essential hypertension Low sodium diet - lisinopril -hydrochlorothiazide  (ZESTORETIC ) 20-12.5 MG tablet; Take 1 tablet by mouth daily.  Dispense: 90 tablet; Refill: 1  2. Permanent atrial fibrillation (HCC) Avoid caffeine - apixaban  (ELIQUIS ) 5 MG TABS tablet; TAKE ONE (1) TABLET BY MOUTH TWO (2) TIMES DAILY  Dispense: 60 tablet; Refill: 5 - metoprolol  succinate (TOPROL -XL) 25 MG 24 hr tablet; Take 1 tablet (25 mg total) by mouth daily.  Dispense: 90 tablet; Refill: 1  3. Gastroesophageal reflux disease, unspecified whether esophagitis present Avoid spicy foods Do not eat 2 hours prior to bedtime - omeprazole  (PRILOSEC) 20 MG capsule; Take 1 capsule (20 mg total) by mouth daily.  Dispense: 90 capsule; Refill: 1  4. Hypothyroidism due to acquired atrophy of  thyroid  Labs pending - levothyroxine  (SYNTHROID ) 50 MCG tablet; Take 1 tablet (50 mcg total) by mouth daily before breakfast.  Dispense: 90 tablet; Refill: 1  5. Hyperlipidemia with target LDL less than 100 Low fat diet - atorvastatin  (LIPITOR) 80 MG tablet; Take 1 tablet (80 mg total) by mouth at bedtime.  Dispense: 90 tablet; Refill: 1  6. History of cerebrovascular accident (CVA) Fall precautions  7. Recurrent major depressive disorder, in full remission (HCC) Stress management - citalopram  (CELEXA ) 20 MG tablet; Take 1 tablet (20 mg total) by mouth daily.  Dispense: 90 tablet; Refill: 1  8. Diverticulosis of large intestine without hemorrhage Watch diet to prevent flare up  9. Benign prostatic hyperplasia without lower urinary tract symptoms Report any voiding issues  10. Huntington's chorea (HCC) Fall prevention - haloperidol  (HALDOL ) 1 MG tablet; Take 1.5 tablets (1.5 mg total) by mouth 2 (two) times daily.  Dispense: 270 tablet; Refill: 1  11. Memory deficits Orient daily  12. BMI 24.0-24.9, adult Discussed diet and exercise for person with BMI >25 Will recheck weight in 3-6 months  13. Bil cerumen impaction Debrox 2-3X a week Labs pending Health Maintenance reviewed Diet and exercise encouraged  Follow up plan: 6 months   Mary-Margaret Gaylyn Keas, FNP

## 2023-12-16 NOTE — Patient Instructions (Signed)
 Earwax Buildup, Adult Your ears make something called earwax. It helps keep germs called bacteria away and protects the skin in your ears. Sometimes, too much earwax can build up. This can cause discomfort or make it harder to hear. What are the causes? Earwax buildup can happen when you have too much earwax in your ears. Earwax is made in the outer part of your ear canal. It's supposed to fall out in small amounts over time. But if your ears aren't able to clean themselves like they should, earwax can build up. What increases the risk? You're more likely to get earwax buildup if: You clean your ears with cotton swabs. You pick at your ears. You use earplugs or in-ear headphones a lot. You wear hearing aids. You may also be more likely to get it if: You're male. You're older. Your ears naturally make more earwax. You have narrow ear canals or extra hair in your ears. Your earwax is too thick or sticky. You have eczema. You're dehydrated. This means there's not enough fluid in your body. What are the signs or symptoms? Symptoms of earwax buildup include: Not being able to hear as well. A feeling of fullness in your ear. Feeling like your ear is plugged. Fluid coming from your ear. Ear pain or an itchy ear. Ringing in your ear. Coughing or problems with balance. How is this diagnosed? Earwax buildup may be diagnosed based on your symptoms, medical history, and an ear exam. During the exam, your health care provider will look into your ear with a tool called an otoscope. You may also have tests, such as a hearing test. How is this treated? Earwax buildup may be treated by: Using ear drops. Having the earwax removed by a provider. The provider may: Flush the ear with water. Use a tool called a curette that has a loop on the end. Use a suction device. Having surgery. This may be done in severe cases. Follow these instructions at home:  Cleaning your ears Clean your ears as told  by your provider. You can clean the outside of your ears with a washcloth or tissue. Do not overclean your ears. Do not put anything into your ear unless told. This includes cotton swabs. General instructions Take over-the-counter and prescription medicines only as told by your provider. Drink enough fluid to keep your pee (urine) pale yellow. This helps thin the earwax. If you have hearing aids, clean them as told. Keep all follow-up visits. If earwax builds up in your ears often or if you use hearing aids, ask your provider how often you should have your ears cleaned. Contact a health care provider if: Your ear pain gets worse. You have a fever. You have pus, blood, or other fluid coming from your ear. You have hearing loss. You have ringing in your ears that won't go away. You feel like the room is spinning. This is called vertigo. Your symptoms don't get better with treatment. This information is not intended to replace advice given to you by your health care provider. Make sure you discuss any questions you have with your health care provider. Document Revised: 10/15/2022 Document Reviewed: 10/15/2022 Elsevier Patient Education  2024 [employer].

## 2023-12-17 LAB — CBC WITH DIFFERENTIAL/PLATELET
Basophils Absolute: 0.1 10*3/uL (ref 0.0–0.2)
Basos: 1 %
EOS (ABSOLUTE): 0.2 10*3/uL (ref 0.0–0.4)
Eos: 4 %
Hematocrit: 39.6 % (ref 37.5–51.0)
Hemoglobin: 13.3 g/dL (ref 13.0–17.7)
Immature Grans (Abs): 0 10*3/uL (ref 0.0–0.1)
Immature Granulocytes: 0 %
Lymphocytes Absolute: 1.4 10*3/uL (ref 0.7–3.1)
Lymphs: 22 %
MCH: 35.2 pg — ABNORMAL HIGH (ref 26.6–33.0)
MCHC: 33.6 g/dL (ref 31.5–35.7)
MCV: 105 fL — ABNORMAL HIGH (ref 79–97)
Monocytes Absolute: 0.8 10*3/uL (ref 0.1–0.9)
Monocytes: 12 %
Neutrophils Absolute: 3.9 10*3/uL (ref 1.4–7.0)
Neutrophils: 61 %
Platelets: 222 10*3/uL (ref 150–450)
RBC: 3.78 x10E6/uL — ABNORMAL LOW (ref 4.14–5.80)
RDW: 12.2 % (ref 11.6–15.4)
WBC: 6.5 10*3/uL (ref 3.4–10.8)

## 2023-12-17 LAB — THYROID PANEL WITH TSH
Free Thyroxine Index: 2.4 (ref 1.2–4.9)
T3 Uptake Ratio: 32 % (ref 24–39)
T4, Total: 7.5 ug/dL (ref 4.5–12.0)
TSH: 2.78 u[IU]/mL (ref 0.450–4.500)

## 2023-12-17 LAB — CMP14+EGFR
ALT: 19 IU/L (ref 0–44)
AST: 29 IU/L (ref 0–40)
Albumin: 4.3 g/dL (ref 3.7–4.7)
Alkaline Phosphatase: 67 IU/L (ref 44–121)
BUN/Creatinine Ratio: 14 (ref 10–24)
BUN: 11 mg/dL (ref 8–27)
Bilirubin Total: 0.8 mg/dL (ref 0.0–1.2)
CO2: 29 mmol/L (ref 20–29)
Calcium: 8.1 mg/dL — ABNORMAL LOW (ref 8.6–10.2)
Chloride: 100 mmol/L (ref 96–106)
Creatinine, Ser: 0.81 mg/dL (ref 0.76–1.27)
Globulin, Total: 2.1 g/dL (ref 1.5–4.5)
Glucose: 115 mg/dL — ABNORMAL HIGH (ref 70–99)
Potassium: 4.7 mmol/L (ref 3.5–5.2)
Sodium: 143 mmol/L (ref 134–144)
Total Protein: 6.4 g/dL (ref 6.0–8.5)
eGFR: 86 mL/min/{1.73_m2} (ref >59)

## 2023-12-17 LAB — LIPID PANEL
Chol/HDL Ratio: 2.7 ratio (ref 0.0–5.0)
Cholesterol, Total: 110 mg/dL (ref 100–199)
HDL: 41 mg/dL (ref >39)
LDL Chol Calc (NIH): 54 mg/dL (ref 0–99)
Triglycerides: 72 mg/dL (ref 0–149)
VLDL Cholesterol Cal: 15 mg/dL (ref 5–40)

## 2023-12-30 ENCOUNTER — Ambulatory Visit: Payer: Medicare Other

## 2024-02-24 ENCOUNTER — Ambulatory Visit: Admitting: Nurse Practitioner

## 2024-02-24 ENCOUNTER — Encounter: Payer: Self-pay | Admitting: Nurse Practitioner

## 2024-02-24 VITALS — BP 114/61 | HR 80 | Temp 97.1°F | Ht 66.0 in | Wt 119.0 lb

## 2024-02-24 DIAGNOSIS — R300 Dysuria: Secondary | ICD-10-CM

## 2024-02-24 DIAGNOSIS — G10 Huntington's disease: Secondary | ICD-10-CM

## 2024-02-24 DIAGNOSIS — N3000 Acute cystitis without hematuria: Secondary | ICD-10-CM

## 2024-02-24 LAB — MICROSCOPIC EXAMINATION
RBC, Urine: >30 /HPF — AB (ref 0–2)
WBC, UA: >30 /HPF — AB (ref 0–5)

## 2024-02-24 LAB — URINALYSIS, ROUTINE W REFLEX MICROSCOPIC
Bilirubin, UA: NEGATIVE
Glucose, UA: NEGATIVE
Ketones, UA: NEGATIVE
Nitrite, UA: NEGATIVE
Specific Gravity, UA: 1.02 (ref 1.005–1.030)
Urobilinogen, Ur: 0.2 mg/dL (ref 0.2–1.0)
pH, UA: 6 (ref 5.0–7.5)

## 2024-02-24 MED ORDER — SULFAMETHOXAZOLE-TRIMETHOPRIM 800-160 MG PO TABS: 1.0000 | ORAL_TABLET | Freq: Two times a day (BID) | ORAL | 0 refills | Status: DC

## 2024-02-24 NOTE — Patient Instructions (Signed)
 Take medication as prescribe Cotton underwear Take shower not bath Cranberry juice, yogurt Force fluids AZO over the counter X2 days Culture pending RTO prn

## 2024-02-24 NOTE — Progress Notes (Signed)
   Subjective:    Patient ID: Sergio Baker, male    DOB: 1935/02/20, 89 y.o.   MRN: [id number]   Chief Complaint: uti   Dysuria  This is a new problem. The current episode started in the past 7 days. The problem occurs intermittently. The problem has been waxing and waning. The quality of the pain is described as burning. The pain is at a severity of 6/10. The pain is moderate. He is Not sexually active. There is No history of pyelonephritis. Associated symptoms include hesitancy and urgency. Pertinent negatives include no chills or hematuria. He has tried nothing for the symptoms. The treatment provided no relief.    Patient Active Problem List   Diagnosis Date Noted   History of CVA (cerebrovascular accident) 05/25/2022   BMI 24.0-24.9, adult 02/10/2021   Ischemic stroke (HCC) 12/22/2020   Essential hypertension 12/01/2019   Irregular heart beat 12/01/2019   Atrial fibrillation (HCC) 12/01/2019   Gastroesophageal reflux disease 09/01/2019   BPH (benign prostatic hyperplasia) 12/23/2015   Hypothyroidism 01/18/2014   Hyperlipidemia with target LDL less than 100 01/18/2014   Depression 01/18/2014   Memory deficits 01/17/2014   Abnormality of gait 05/23/2012   Huntington's chorea (HCC) 05/23/2012   Diverticulosis of large intestine 03/08/2009       Review of Systems  Constitutional:  Negative for chills and fever.  Genitourinary:  Positive for dysuria, hesitancy and urgency. Negative for hematuria, penile swelling and scrotal swelling.       Objective:   Physical Exam Constitutional:      Appearance: Normal appearance.  Cardiovascular:     Rate and Rhythm: Normal rate and regular rhythm.     Heart sounds: Normal heart sounds.  Pulmonary:     Breath sounds: Normal breath sounds.  Skin:    General: Skin is warm.  Neurological:     General: No focal deficit present.     Mental Status: He is alert and oriented to person, place, and time.  Psychiatric:        Mood  and Affect: Mood normal.        Behavior: Behavior normal.     BP 114/61   Pulse 80   Temp (!) 97.1 F (36.2 C) (Temporal)   Ht 5' 6 (1.676 m)   Wt 119 lb (54 kg)   BMI 19.21 kg/m        Assessment & Plan:   Sergio Baker in today with chief complaint of No chief complaint on file.   1. Dysuria (Primary) - Urinalysis, Routine w reflex microscopic - Urine Culture  2. Acute cystitis without hematuria Take medication as prescribe Cotton underwear Take shower not bath Cranberry juice, yogurt Force fluids AZO over the counter X2 days Culture pending RTO prn  - sulfamethoxazole -trimethoprim  (BACTRIM  DS) 800-160 MG tablet; Take 1 tablet by mouth 2 (two) times daily.  Dispense: 20 tablet; Refill: 0    The above assessment and management plan was discussed with the patient. The patient verbalized understanding of and has agreed to the management plan. Patient is aware to call the clinic if symptoms persist or worsen. Patient is aware when to return to the clinic for a follow-up visit. Patient educated on when it is appropriate to go to the emergency department.   Mary-Margaret Gladis, FNP

## 2024-02-27 DIAGNOSIS — S42202A Unspecified fracture of upper end of left humerus, initial encounter for closed fracture: Secondary | ICD-10-CM | POA: Diagnosis not present

## 2024-02-27 DIAGNOSIS — S42294A Other nondisplaced fracture of upper end of right humerus, initial encounter for closed fracture: Secondary | ICD-10-CM | POA: Diagnosis not present

## 2024-02-27 LAB — URINE CULTURE

## 2024-02-28 ENCOUNTER — Ambulatory Visit: Payer: Self-pay | Admitting: Nurse Practitioner

## 2024-02-29 ENCOUNTER — Encounter

## 2024-03-01 ENCOUNTER — Ambulatory Visit

## 2024-03-06 ENCOUNTER — Telehealth: Payer: Self-pay | Admitting: Nurse Practitioner

## 2024-03-06 NOTE — Telephone Encounter (Signed)
 I spoke to pt's spouse and advised he would ntbs in office for his shoulder as we don't have any records of the shoulder fracture and she states she has an ortho doctor in [place] that she has already spoken with. Advised to call ortho to see if they can do something else and she voiced understanding since she can't come in office to be seen.

## 2024-03-06 NOTE — Telephone Encounter (Signed)
 Patient was seen 7-10 for UTI and finished last antibiotic 03-05-2024. Urine is still not clear like it should be and don't hurt to urinate. Wants to know if patient needs another round of antibiotic. Can't come to the office due to broken shoulder. Patient has been crying, screaming and keeping spouse awake during the night because of pain in shoulder. What does MMM recommend? MMM didn't want to put him on pain meds with the UTI because she was afraid he would fall. Spouse is still having some side affects from the Ozempic and that's another reason he can't come to the office. She is afraid to drive.

## 2024-03-07 ENCOUNTER — Institutional Professional Consult (permissible substitution) (INDEPENDENT_AMBULATORY_CARE_PROVIDER_SITE_OTHER): Admitting: Otolaryngology

## 2024-03-07 ENCOUNTER — Ambulatory Visit (INDEPENDENT_AMBULATORY_CARE_PROVIDER_SITE_OTHER): Admitting: Audiology

## 2024-04-18 DIAGNOSIS — S4292XA Fracture of left shoulder girdle, part unspecified, initial encounter for closed fracture: Secondary | ICD-10-CM | POA: Diagnosis not present

## 2024-05-08 ENCOUNTER — Ambulatory Visit

## 2024-05-10 ENCOUNTER — Ambulatory Visit (INDEPENDENT_AMBULATORY_CARE_PROVIDER_SITE_OTHER)

## 2024-05-10 DIAGNOSIS — Z23 Encounter for immunization: Secondary | ICD-10-CM

## 2024-05-25 DIAGNOSIS — D225 Melanocytic nevi of trunk: Secondary | ICD-10-CM | POA: Diagnosis not present

## 2024-05-25 DIAGNOSIS — L821 Other seborrheic keratosis: Secondary | ICD-10-CM | POA: Diagnosis not present

## 2024-05-25 DIAGNOSIS — D692 Other nonthrombocytopenic purpura: Secondary | ICD-10-CM | POA: Diagnosis not present

## 2024-05-25 DIAGNOSIS — L570 Actinic keratosis: Secondary | ICD-10-CM | POA: Diagnosis not present

## 2024-05-25 DIAGNOSIS — Z85828 Personal history of other malignant neoplasm of skin: Secondary | ICD-10-CM | POA: Diagnosis not present

## 2024-06-01 ENCOUNTER — Ambulatory Visit

## 2024-06-01 VITALS — BP 114/61 | HR 80 | Ht 66.0 in | Wt 119.0 lb

## 2024-06-01 DIAGNOSIS — Z0000 Encounter for general adult medical examination without abnormal findings: Secondary | ICD-10-CM | POA: Diagnosis not present

## 2024-06-01 NOTE — Patient Instructions (Signed)
 Sergio Baker,  Thank you for taking the time for your Medicare Wellness Visit. I appreciate your continued commitment to your health goals. Please review the care plan we discussed, and feel free to reach out if I can assist you further.  Medicare recommends these wellness visits once per year to help you and your care team stay ahead of potential health issues. These visits are designed to focus on prevention, allowing your provider to concentrate on managing your acute and chronic conditions during your regular appointments.  Please note that Annual Wellness Visits do not include a physical exam. Some assessments may be limited, especially if the visit was conducted virtually. If needed, we may recommend a separate in-person follow-up with your provider.  Ongoing Care Seeing your primary care provider every 3 to 6 months helps us  monitor your health and provide consistent, personalized care.   Referrals If a referral was made during today's visit and you haven't received any updates within two weeks, please contact the referred provider directly to check on the status.  Recommended Screenings:  Health Maintenance  Topic Date Due   Breast Cancer Screening  Never done   Medicare Annual Wellness Visit  12/17/2023   COVID-19 Vaccine (4 - 2025-26 season) 04/17/2024   DTaP/Tdap/Td vaccine (1 - Tdap) 07/01/2024*   Pneumococcal Vaccine for age over 31  Completed   Flu Shot  Completed   Zoster (Shingles) Vaccine  Completed   Meningitis B Vaccine  Aged Out  *Topic was postponed. The date shown is not the original due date.       06/01/2024    3:15 PM  Advanced Directives  Does Patient Have a Medical Advance Directive? Yes  Type of Estate agent of [place];Living will  Copy of Healthcare Power of Attorney in Chart? No - copy requested   Advance Care Planning is important because it: Ensures you receive medical care that aligns with your values, goals, and  preferences. Provides guidance to your family and loved ones, reducing the emotional burden of decision-making during critical moments.  Vision: Annual vision screenings are recommended for early detection of glaucoma, cataracts, and diabetic retinopathy. These exams can also reveal signs of chronic conditions such as diabetes and high blood pressure.  Dental: Annual dental screenings help detect early signs of oral cancer, gum disease, and other conditions linked to overall health, including heart disease and diabetes.  Please see the attached documents for additional preventive care recommendations.

## 2024-06-01 NOTE — Progress Notes (Signed)
 Subjective:   Sergio Baker is a 89 y.o. who presents for a Medicare Wellness preventive visit.  As a reminder, Annual Wellness Visits don't include a physical exam, and some assessments may be limited, especially if this visit is performed virtually. We may recommend an in-person follow-up visit with your provider if needed.  Visit Complete: Virtual I connected with  Sergio Baker on 06/01/24 by a audio enabled telemedicine application and verified that I am speaking with the correct person using two identifiers.  Patient Location: Home  Provider Location: Home Office  I discussed the limitations of evaluation and management by telemedicine. The patient expressed understanding and agreed to proceed.  Vital Signs: Because this visit was a virtual/telehealth visit, some criteria may be missing or patient reported. Any vitals not documented were not able to be obtained and vitals that have been documented are patient reported.  VideoDeclined- This patient declined Librarian, academic. Therefore the visit was completed with audio only.  Persons Participating in Visit: Patient.  AWV Questionnaire: No: Patient Medicare AWV questionnaire was not completed prior to this visit.  Cardiac Risk Factors include: advanced age (>76men, >26 women);dyslipidemia;hypertension;male gender;smoking/ tobacco exposure     Objective:    There were no vitals filed for this visit. There is no height or weight on file to calculate BMI.     06/01/2024    3:15 PM 12/17/2022   10:02 AM 12/15/2021   11:28 AM 12/12/2020    2:04 PM 12/04/2019    2:14 PM 06/14/2018    2:49 PM 07/19/2017    6:42 AM  Advanced Directives  Does Patient Have a Medical Advance Directive? Yes Yes Yes Yes Yes Yes  No   Type of Estate agent of [place];Living will Healthcare Power of [place];Living will Healthcare Power of [place];Living will Healthcare Power of [place];Living will  Living will;Healthcare Power of [employer] Power of [place];Living will   Does patient want to make changes to medical advance directive?     No - Patient declined No - Patient declined    Copy of Healthcare Power of Attorney in Chart? No - copy requested No - copy requested No - copy requested No - copy requested No - copy requested No - copy requested    Would patient like information on creating a medical advance directive?       No - Patient declined      Data saved with a previous flowsheet row definition    Current Medications (verified) Outpatient Encounter Medications as of 06/01/2024  Medication Sig   acetaminophen  (TYLENOL ) 650 MG CR tablet Take 1,300 mg by mouth daily as needed for pain.   Aloe Vera 25 MG CAPS Take 25 mg by mouth daily.   apixaban  (ELIQUIS ) 2.5 MG TABS tablet Take 1 tablet (2.5 mg total) by mouth 2 (two) times daily.   atorvastatin  (LIPITOR) 80 MG tablet Take 1 tablet (80 mg total) by mouth at bedtime.   Cholecalciferol (VITAMIN D3) 2000 UNITS capsule Take 2,000 Units by mouth daily.   citalopram  (CELEXA ) 20 MG tablet Take 1 tablet (20 mg total) by mouth daily.   Glucosamine-Chondroit-Vit C-Mn (GLUCOSAMINE CHONDR 1500 COMPLX PO) Take 1 tablet by mouth daily.   haloperidol  (HALDOL ) 1 MG tablet Take 1.5 tablets (1.5 mg total) by mouth 2 (two) times daily.   levothyroxine  (SYNTHROID ) 50 MCG tablet Take 1 tablet (50 mcg total) by mouth daily before breakfast.   lisinopril -hydrochlorothiazide  (ZESTORETIC ) 20-12.5 MG tablet Take  1 tablet by mouth daily.   Multiple Vitamin (MULTIVITAMIN WITH MINERALS) TABS Take 1 tablet by mouth daily. For Men 50+   Olopatadine  HCl 0.2 % SOLN Apply 1 drop to eye daily at 12 noon.   Omega-3 Fatty Acids (FISH OIL PO) Take 1 tablet by mouth daily.   omeprazole  (PRILOSEC) 20 MG capsule Take 1 capsule (20 mg total) by mouth daily.   potassium chloride  (KLOR-CON ) 10 MEQ tablet Take 1 tablet (10 mEq total) by mouth 2 (two) times  daily.   sulfamethoxazole -trimethoprim  (BACTRIM  DS) 800-160 MG tablet Take 1 tablet by mouth 2 (two) times daily.   No facility-administered encounter medications on file as of 06/01/2024.    Allergies (verified) Patient has no known allergies.   History: Past Medical History:  Diagnosis Date   Abnormality of gait    Arthritis    Atrial fibrillation (HCC)    Depression    Diverticula of colon    Enlarged prostate    GERD (gastroesophageal reflux disease)    Hearing loss    Hemorrhoids    Hiatal hernia    Huntington's disease (HCC) 2010   Hyperlipidemia    Hypothyroidism    Memory deficits 01/17/2014   Proctocolitis, ulcerative (HCC)    Diagnosed in the 1980s   Schatzki's ring    Stroke (cerebrum) (HCC)    Ulcerative colitis (HCC)    Past Surgical History:  Procedure Laterality Date   APPENDECTOMY     BIOPSY  07/19/2017   Procedure: BIOPSY;  Surgeon: Shaaron Lamar HERO, MD;  [facility]: [facility];  Service: Endoscopy;;  colon   BRAIN SURGERY     CATARACT EXTRACTION W/PHACO Left 05/07/2017   Procedure: CATARACT EXTRACTION PHACO AND INTRAOCULAR LENS PLACEMENT (IOC);  Surgeon: Harrie Agent, MD;  Location: [facility];  Service: Ophthalmology;  Laterality: Left;  CDE: 10.49   CATARACT EXTRACTION W/PHACO Right 06/04/2017   Procedure: CATARACT EXTRACTION PHACO AND INTRAOCULAR LENS PLACEMENT RIGHT EYE;  Surgeon: Harrie Agent, MD;  Location: [facility];  Service: Ophthalmology;  Laterality: Right;  CDE: 7.07   COLONOSCOPY  02/2010   patchy erythema, minimal granularity in distal rectum, left-sided diverticula, ileal diverticulum.  Bx benign. next TCS 02/2012   COLONOSCOPY  03/24/2012   Dr. Shaaron- inflammatory changes of the rectum and colon consistent with history of U/C. bx= benign colonic mucosa   COLONOSCOPY N/A 10/05/2014   RMR: abnormal rectal and sigmoid mucosa as described above status post segmental biopsy. active colitis sigmoid colon.   COLONOSCOPY WITH PROPOFOL  N/A 07/19/2017    Procedure: COLONOSCOPY WITH PROPOFOL ;  Surgeon: Shaaron Lamar HERO, MD;  Location: [facility];  Service: Endoscopy;  Laterality: N/A;  7:30am   cyst removed from urinary bladder     ESOPHAGOGASTRODUODENOSCOPY  04/2008   schatzki ring, s/p ED, hh   LUMBAR FUSION     SHOULDER ARTHROSCOPY W/ ROTATOR CUFF REPAIR Bilateral    TONSILLECTOMY     Family History  Problem Relation Age of Onset   Lung cancer Mother 80       deceased   Cancer Sister        thyroid    Arthritis Sister    Other Brother        Posttraumatic stress disorder   Cancer Brother        skin cancer   SIDS Brother    Huntington's disease Daughter    Colon cancer Neg Hx    Stroke Neg Hx    Social History   Socioeconomic  History   Marital status: Married    Spouse name: Rock   Number of children: 1   Years of education: 12th   Highest education level: 12th grade  Occupational History   Occupation: retired Chartered certified accountant   Occupation: works as Engineer, materials at [employer]  Tobacco Use   Smoking status: Former    Current packs/day: 0.00    Average packs/day: 0.3 packs/day for 8.0 years (2.0 ttl pk-yrs)    Types: Cigarettes    Start date: 05/03/1965    Quit date: 05/03/1973    Years since quitting: 51.1   Smokeless tobacco: Never   Tobacco comments:    Quit 1970's   Vaping Use   Vaping status: Never Used  Substance and Sexual Activity   Alcohol use: No    Alcohol/week: 0.0 standard drinks of alcohol   Drug use: No   Sexual activity: Not on file  Other Topics Concern   Not on file  Social History Narrative   Patient lives at home with his wife.    Social Drivers of Corporate investment banker Strain: Low Risk  (06/01/2024)   Overall Financial Resource Strain (CARDIA)    Difficulty of Paying Living Expenses: Not hard at all  Food Insecurity: No Food Insecurity (06/01/2024)   Hunger Vital Sign    Worried About Running Out of Food in the Last Year: Never true    Ran Out of Food in the Last Year: Never true   Transportation Needs: No Transportation Needs (06/01/2024)   PRAPARE - Administrator, Civil Service (Medical): No    Lack of Transportation (Non-Medical): No  Physical Activity: Inactive (06/01/2024)   Exercise Vital Sign    Days of Exercise per Week: 0 days    Minutes of Exercise per Session: 0 min  Stress: No Stress Concern Present (06/01/2024)   [employer] of Occupational Health - Occupational Stress Questionnaire    Feeling of Stress: Not at all  Social Connections: Moderately Integrated (06/01/2024)   Social Connection and Isolation Panel    Frequency of Communication with Friends and Family: More than three times a week    Frequency of Social Gatherings with Friends and Family: More than three times a week    Attends Religious Services: More than 4 times per year    Active Member of [employer] or Organizations: No    Attends Banker Meetings: Never    Marital Status: Married    Tobacco Counseling Counseling given: Not Answered Tobacco comments: Quit 1970's     Clinical Intake:  Pre-visit preparation completed: Yes  Pain : No/denies pain     Nutritional Risks: None Diabetes: No  Lab Results  Component Value Date   HGBA1C 5.6 12/16/2023   HGBA1C 5.9 (H) 06/28/2023   HGBA1C 6.4 (H) 04/01/2023     How often do you need to have someone help you when you read instructions, pamphlets, or other written materials from your doctor or pharmacy?: 1 - Never  Interpreter Needed?: No  Information entered by :: alia t/cma   Activities of Daily Living     06/01/2024    3:11 PM  In your present state of health, do you have any difficulty performing the following activities:  Hearing? 0  Vision? 0  Difficulty concentrating or making decisions? 0  Walking or climbing stairs? 1  Dressing or bathing? 0  Doing errands, shopping? 1  Comment Pt's wife  Quarry manager and eating ? N  Using the Toilet?  N  In the past six months, have you  accidently leaked urine? Y  Do you have problems with loss of bowel control? N  Managing your Medications? Y  Comment Pt's wife  Managing your Finances? Y  Comment Pt's wife  Housekeeping or managing your Housekeeping? N    Patient Care Team: Gladis Mustard, FNP as PCP - General (Nurse Practitioner) Debera Jayson MATSU, MD as PCP - Cardiology (Cardiology) Shaaron Lamar HERO, MD (Gastroenterology)  I have updated your Care Teams any recent Medical Services you may have received from other providers in the past year.     Assessment:   This is a routine wellness examination for Sergio Baker.  Hearing/Vision screen Hearing Screening - Comments:: Pt have hearing dif Vision Screening - Comments:: Pt wear reading glasses/[facility] in [place]/last ov yr ago   Goals Addressed   None    Depression Screen     06/01/2024    3:16 PM 12/16/2023    2:44 PM 07/02/2023    2:07 PM 04/05/2023    2:17 PM 12/28/2022    2:11 PM 12/17/2022   10:02 AM 08/27/2022    2:39 PM  PHQ 2/9 Scores  PHQ - 2 Score 0 0 0 0  0 0  PHQ- 9 Score   0 0   1  Exception Documentation     Patient refusal      Fall Risk     06/01/2024    3:05 PM 12/16/2023    2:44 PM 07/02/2023    2:07 PM 04/05/2023    2:17 PM 12/17/2022   10:00 AM  Fall Risk   Falls in the past year? 0 0 0 0 1  Number falls in past yr: 1    1  Injury with Fall? 0    1  Risk for fall due to : Impaired balance/gait;Impaired mobility    History of fall(s);Impaired balance/gait;Orthopedic patient  Follow up Falls evaluation completed;Education provided    Education provided;Falls prevention discussed    MEDICARE RISK AT HOME:  Medicare Risk at Home Any stairs in or around the home?: No If so, are there any without handrails?: No Home free of loose throw rugs in walkways, pet beds, electrical cords, etc?: Yes Adequate lighting in your home to reduce risk of falls?: Yes Life alert?: No Use of a cane, walker or w/c?: Yes Grab bars in the bathroom?:  No Shower chair or bench in shower?: No Elevated toilet seat or a handicapped toilet?: No  TIMED UP AND GO:  Was the test performed?  no  Cognitive Function: 6CIT completed    06/15/2018    5:00 PM 06/10/2017    3:10 PM 03/23/2017    1:38 PM 10/09/2016   11:47 AM 10/05/2016    6:18 PM  MMSE - Mini Mental State Exam  Not completed: Unable to complete      Orientation to time  5  5  5  3    Orientation to Place  5  5  5  5    Registration  3  3  3  2    Attention/ Calculation  5  5  5  5    Recall  2  3  3  1    Language- name 2 objects  2  2  2  2    Language- repeat  1 1 1 1   Language- follow 3 step command  3  3  3  3    Language- read & follow direction  1  1  1  1   Write a sentence  1  1  1  1    Copy design  1  1  1  1    Total score  29  30  30  25       Data saved with a previous flowsheet row definition        06/01/2024    3:23 PM 12/17/2022   10:03 AM 12/15/2021   11:05 AM 12/04/2019    2:15 PM  6CIT Screen  What Year? 0 points 0 points 4 points 0 points  What month? 0 points 0 points 0 points 0 points  What time? 0 points 0 points 0 points 0 points  Count back from 20 0 points 0 points 0 points 0 points  Months in reverse 0 points 0 points 4 points 2 points  Repeat phrase 0 points 0 points 4 points 2 points  Total Score 0 points 0 points 12 points 4 points    Immunizations Immunization History  Administered Date(s) Administered   Fluad Quad(high Dose 65+) 04/28/2019, 05/03/2020, 05/09/2021, 05/21/2022   Fluad Trivalent(High Dose 65+) 05/06/2023   INFLUENZA, HIGH DOSE SEASONAL PF 05/12/2017, 05/17/2018, 05/10/2024   Influenza,inj,Quad PF,6+ Mos 05/14/2014, 05/16/2015, 05/07/2016   Moderna Sars-Covid-2 Vaccination 09/21/2019, 10/20/2019, 07/01/2020   Pneumococcal Conjugate-13 10/25/2014   Pneumococcal Polysaccharide-23 05/18/2007   Zoster Recombinant(Shingrix ) 11/14/2021, 02/19/2022    Screening Tests Health Maintenance  Topic Date Due   Mammogram  Never done    Medicare Annual Wellness (AWV)  12/17/2023   COVID-19 Vaccine (4 - 2025-26 season) 04/17/2024   DTaP/Tdap/Td (1 - Tdap) 07/01/2024 (Originally 02/03/1954)   Pneumococcal Vaccine: 50+ Years  Completed   Influenza Vaccine  Completed   Zoster Vaccines- Shingrix   Completed   Meningococcal B Vaccine  Aged Out    Health Maintenance Items Addressed: See Nurse Notes at the end of this note  Additional Screening:  Vision Screening: Recommended annual ophthalmology exams for early detection of glaucoma and other disorders of the eye. Is the patient up to date with their annual eye exam?  Yes  Who is the provider or what is the name of the office in which the patient attends annual eye exams? [facility] in [place], [place]  Dental Screening: Recommended annual dental exams for proper oral hygiene  Community Resource Referral / Chronic Care Management: CRR required this visit?  No   CCM required this visit?  No   Plan:    I have personally reviewed and noted the following in the patient's chart:   Medical and social history Use of alcohol, tobacco or illicit drugs  Current medications and supplements including opioid prescriptions. Patient is not currently taking opioid prescriptions. Functional ability and status Nutritional status Physical activity Advanced directives List of other physicians Hospitalizations, surgeries, and ER visits in previous 12 months Vitals Screenings to include cognitive, depression, and falls Referrals and appointments  In addition, I have reviewed and discussed with patient certain preventive protocols, quality metrics, and best practice recommendations. A written personalized care plan for preventive services as well as general preventive health recommendations were provided to patient.   Ozie Ned, CMA   06/01/2024   After Visit Summary: (MyChart) Due to this being a telephonic visit, the after visit summary with patients personalized plan was offered  to patient via MyChart   Notes: Nothing significant to report at this time.

## 2024-06-09 DIAGNOSIS — S0101XA Laceration without foreign body of scalp, initial encounter: Secondary | ICD-10-CM | POA: Diagnosis not present

## 2024-06-12 ENCOUNTER — Ambulatory Visit: Admitting: Adult Health

## 2024-06-15 ENCOUNTER — Encounter: Payer: Self-pay | Admitting: Nurse Practitioner

## 2024-06-15 ENCOUNTER — Ambulatory Visit: Admitting: Nurse Practitioner

## 2024-06-15 VITALS — BP 114/67 | HR 55 | Temp 97.4°F | Ht 66.0 in | Wt 116.0 lb

## 2024-06-15 DIAGNOSIS — I10 Essential (primary) hypertension: Secondary | ICD-10-CM

## 2024-06-15 DIAGNOSIS — R413 Other amnesia: Secondary | ICD-10-CM

## 2024-06-15 DIAGNOSIS — E039 Hypothyroidism, unspecified: Secondary | ICD-10-CM | POA: Diagnosis not present

## 2024-06-15 DIAGNOSIS — N400 Enlarged prostate without lower urinary tract symptoms: Secondary | ICD-10-CM

## 2024-06-15 DIAGNOSIS — K5730 Diverticulosis of large intestine without perforation or abscess without bleeding: Secondary | ICD-10-CM

## 2024-06-15 DIAGNOSIS — K219 Gastro-esophageal reflux disease without esophagitis: Secondary | ICD-10-CM

## 2024-06-15 DIAGNOSIS — I4821 Permanent atrial fibrillation: Secondary | ICD-10-CM | POA: Diagnosis not present

## 2024-06-15 DIAGNOSIS — Z8673 Personal history of transient ischemic attack (TIA), and cerebral infarction without residual deficits: Secondary | ICD-10-CM

## 2024-06-15 DIAGNOSIS — Z4802 Encounter for removal of sutures: Secondary | ICD-10-CM

## 2024-06-15 DIAGNOSIS — G10 Huntington's disease: Secondary | ICD-10-CM

## 2024-06-15 DIAGNOSIS — Z23 Encounter for immunization: Secondary | ICD-10-CM

## 2024-06-15 DIAGNOSIS — E785 Hyperlipidemia, unspecified: Secondary | ICD-10-CM

## 2024-06-15 DIAGNOSIS — Z6824 Body mass index (BMI) 24.0-24.9, adult: Secondary | ICD-10-CM

## 2024-06-15 DIAGNOSIS — F3342 Major depressive disorder, recurrent, in full remission: Secondary | ICD-10-CM

## 2024-06-15 LAB — LIPID PANEL

## 2024-06-15 MED ORDER — OMEPRAZOLE 20 MG PO CPDR: 20.0000 mg | DELAYED_RELEASE_CAPSULE | Freq: Every day | ORAL | 1 refills | Status: AC

## 2024-06-15 MED ORDER — APIXABAN 2.5 MG PO TABS: 2.5000 mg | ORAL_TABLET | Freq: Two times a day (BID) | ORAL | 6 refills | Status: AC

## 2024-06-15 MED ORDER — LISINOPRIL-HYDROCHLOROTHIAZIDE 20-12.5 MG PO TABS: 1.0000 | ORAL_TABLET | Freq: Every day | ORAL | 1 refills | Status: AC

## 2024-06-15 MED ORDER — HALOPERIDOL 1 MG PO TABS: 1.5000 mg | ORAL_TABLET | Freq: Two times a day (BID) | ORAL | 1 refills | Status: AC

## 2024-06-15 MED ORDER — ATORVASTATIN CALCIUM 80 MG PO TABS: 80.0000 mg | ORAL_TABLET | Freq: Every day | ORAL | 1 refills | Status: AC

## 2024-06-15 MED ORDER — LEVOTHYROXINE SODIUM 50 MCG PO TABS
50.0000 ug | ORAL_TABLET | Freq: Every day | ORAL | 1 refills | Status: DC
Start: 2024-06-15 — End: 2024-06-16

## 2024-06-15 MED ORDER — CITALOPRAM HYDROBROMIDE 20 MG PO TABS
20.0000 mg | ORAL_TABLET | Freq: Every day | ORAL | 1 refills | Status: AC
Start: 2024-06-15 — End: ?

## 2024-06-15 MED ORDER — POTASSIUM CHLORIDE ER 10 MEQ PO TBCR: 10.0000 meq | EXTENDED_RELEASE_TABLET | Freq: Two times a day (BID) | ORAL | 1 refills | Status: DC

## 2024-06-15 NOTE — Addendum Note (Signed)
 Addended by: VIKTORIA ALAN MATSU on: 06/15/2024 03:37 PM   Modules accepted: Orders

## 2024-06-15 NOTE — Progress Notes (Addendum)
 Subjective:    Patient ID: Sergio Baker, male    DOB: [birth year], 89 y.o.   MRN: [id number]   Chief Complaint: medical management of chronic issues     HPI:  Sergio Baker is a 89 y.o. who identifies as a male who was assigned male at birth.   Social history: Lives with: wife Work history: retired   Water Engineer in today for follow up of the following chronic medical issues:  1. Essential hypertension No c/o chest pain, sob or headache. Does not check blood pressure at home. BP Readings from Last 3 Encounters:  06/01/24 114/61  02/24/24 114/61  12/16/23 124/64     2. Permanent atrial fibrillation (HCC) Denies palpitations or heart racing  3. Gastroesophageal reflux disease, unspecified whether esophagitis present Is on omperazole and has no c/o symptoms  4. Hypothyroidism due to acquired atrophy of thyroid  No issues that aware of Lab Results  Component Value Date   TSH 2.780 12/16/2023     5. Hyperlipidemia with target LDL less than 100 Eats whatever his wife fixes him to eat Lab Results  Component Value Date   CHOL 110 12/16/2023   HDL 41 12/16/2023   LDLCALC 54 12/16/2023   TRIG 72 12/16/2023   CHOLHDL 2.7 12/16/2023     6. History of cerebrovascular accident (CVA) No permanent effects  7. Recurrent major depressive disorder, in full remission (HCC) Has been on celexa  for awhile and is doing well    06/01/2024    3:16 PM 12/16/2023    2:44 PM 07/02/2023    2:07 PM  Depression screen PHQ 2/9  Decreased Interest 0 0 0  Down, Depressed, Hopeless 0 0 0  PHQ - 2 Score 0 0 0  Altered sleeping   0  Tired, decreased energy   0  Change in appetite   0  Feeling bad or failure about yourself    0  Trouble concentrating   0  Moving slowly or fidgety/restless   0  Suicidal thoughts   0  PHQ-9 Score   0  Difficult doing work/chores   Not difficult at all      12/16/2023    2:44 PM 07/02/2023    2:07 PM 04/05/2023    2:18 PM 08/27/2022    2:40 PM  GAD  7 : Generalized Anxiety Score  Nervous, Anxious, on Edge 0 0 0 0  Control/stop worrying 0 0 0 0  Worry too much - different things 0 0 0 0  Trouble relaxing 0 0 0 0  Restless 0 0 0 0  Easily annoyed or irritable 0 0 0 0  Afraid - awful might happen 0 0 0 0  Total GAD 7 Score 0 0 0 0  Anxiety Difficulty Not difficult at all Not difficult at all Not difficult at all Not difficult at all       8. Diverticulosis of large intestine without hemorrhage No recent flare ups  9. Benign prostatic hyperplasia without lower urinary tract symptoms No voiding issues  10. Huntington's chorea (HCC) Is on haldol .sees neurology every 6 months  11. Memory deficits Wife says he repeats hisself a lot and is very forgetful. Still able to recognize people he sees on a regular basis  12. BMI 24.0-24.9, adult No recent weight changes  Wt Readings from Last 3 Encounters:  06/01/24 119 lb (54 kg)  02/24/24 119 lb (54 kg)  12/16/23 126 lb (57.2 kg)   BMI Readings from Last 3 Encounters:  06/01/24 19.21 kg/m  02/24/24 19.21 kg/m  12/16/23 20.34 kg/m       New complaints: Patient fell Friday and lacerated his head. Went to ED and had staples put in. He is very unsteady on his feet from huntington's.  No Known Allergies Outpatient Encounter Medications as of 06/15/2024  Medication Sig   acetaminophen  (TYLENOL ) 650 MG CR tablet Take 1,300 mg by mouth daily as needed for pain.   Aloe Vera 25 MG CAPS Take 25 mg by mouth daily.   apixaban  (ELIQUIS ) 2.5 MG TABS tablet Take 1 tablet (2.5 mg total) by mouth 2 (two) times daily.   atorvastatin  (LIPITOR) 80 MG tablet Take 1 tablet (80 mg total) by mouth at bedtime.   Cholecalciferol (VITAMIN D3) 2000 UNITS capsule Take 2,000 Units by mouth daily.   citalopram  (CELEXA ) 20 MG tablet Take 1 tablet (20 mg total) by mouth daily.   Glucosamine-Chondroit-Vit C-Mn (GLUCOSAMINE CHONDR 1500 COMPLX PO) Take 1 tablet by mouth daily.   haloperidol  (HALDOL ) 1  MG tablet Take 1.5 tablets (1.5 mg total) by mouth 2 (two) times daily.   levothyroxine  (SYNTHROID ) 50 MCG tablet Take 1 tablet (50 mcg total) by mouth daily before breakfast.   lisinopril -hydrochlorothiazide  (ZESTORETIC ) 20-12.5 MG tablet Take 1 tablet by mouth daily.   Multiple Vitamin (MULTIVITAMIN WITH MINERALS) TABS Take 1 tablet by mouth daily. For Men 50+   Olopatadine  HCl 0.2 % SOLN Apply 1 drop to eye daily at 12 noon.   Omega-3 Fatty Acids (FISH OIL PO) Take 1 tablet by mouth daily.   omeprazole  (PRILOSEC) 20 MG capsule Take 1 capsule (20 mg total) by mouth daily.   potassium chloride  (KLOR-CON ) 10 MEQ tablet Take 1 tablet (10 mEq total) by mouth 2 (two) times daily.   sulfamethoxazole -trimethoprim  (BACTRIM  DS) 800-160 MG tablet Take 1 tablet by mouth 2 (two) times daily.   No facility-administered encounter medications on file as of 06/15/2024.    Past Surgical History:  Procedure Laterality Date   APPENDECTOMY     BIOPSY  07/19/2017   Procedure: BIOPSY;  Surgeon: Shaaron Lamar HERO, MD;  Location: [facility];  Service: Endoscopy;;  colon   BRAIN SURGERY     CATARACT EXTRACTION W/PHACO Left 05/07/2017   Procedure: CATARACT EXTRACTION PHACO AND INTRAOCULAR LENS PLACEMENT (IOC);  Surgeon: Harrie Agent, MD;  Location: [facility];  Service: Ophthalmology;  Laterality: Left;  CDE: 10.49   CATARACT EXTRACTION W/PHACO Right 06/04/2017   Procedure: CATARACT EXTRACTION PHACO AND INTRAOCULAR LENS PLACEMENT RIGHT EYE;  Surgeon: Harrie Agent, MD;  Location: [facility];  Service: Ophthalmology;  Laterality: Right;  CDE: 7.07   COLONOSCOPY  02/2010   patchy erythema, minimal granularity in distal rectum, left-sided diverticula, ileal diverticulum.  Bx benign. next TCS 02/2012   COLONOSCOPY  03/24/2012   Dr. Shaaron- inflammatory changes of the rectum and colon consistent with history of U/C. bx= benign colonic mucosa   COLONOSCOPY N/A 10/05/2014   RMR: abnormal rectal and sigmoid mucosa as described  above status post segmental biopsy. active colitis sigmoid colon.   COLONOSCOPY WITH PROPOFOL  N/A 07/19/2017   Procedure: COLONOSCOPY WITH PROPOFOL ;  Surgeon: Shaaron Lamar HERO, MD;  Location: [facility];  Service: Endoscopy;  Laterality: N/A;  7:30am   cyst removed from urinary bladder     ESOPHAGOGASTRODUODENOSCOPY  04/2008   schatzki ring, s/p ED, hh   LUMBAR FUSION     SHOULDER ARTHROSCOPY W/ ROTATOR CUFF REPAIR Bilateral    TONSILLECTOMY  Family History  Problem Relation Age of Onset   Lung cancer Mother 32       deceased   Cancer Sister        thyroid    Arthritis Sister    Other Brother        Posttraumatic stress disorder   Cancer Brother        skin cancer   SIDS Brother    Huntington's disease Daughter    Colon cancer Neg Hx    Stroke Neg Hx       Controlled substance contract: n/a     Review of Systems  Constitutional:  Negative for diaphoresis.  Eyes:  Negative for pain.  Respiratory:  Negative for shortness of breath.   Cardiovascular:  Negative for chest pain, palpitations and leg swelling.  Gastrointestinal:  Negative for abdominal pain.  Endocrine: Negative for polydipsia.  Skin:  Negative for rash.  Neurological:  Negative for dizziness, weakness and headaches.  Hematological:  Does not bruise/bleed easily.  All other systems reviewed and are negative.      Objective:   Physical Exam Vitals and nursing note reviewed.  Constitutional:      Appearance: Normal appearance. He is well-developed.  HENT:     Head: Normocephalic.     Right Ear: There is impacted cerumen.     Left Ear: There is impacted cerumen.     Nose: Nose normal.     Mouth/Throat:     Mouth: Mucous membranes are moist.     Pharynx: Oropharynx is clear.  Eyes:     General:        Right eye: Discharge (watery) present.     Pupils: Pupils are equal, round, and reactive to light.     Comments: Right led edema and erythema  Neck:     Thyroid : No thyroid  mass or  thyromegaly.     Vascular: No carotid bruit or JVD.     Trachea: Phonation normal.  Cardiovascular:     Rate and Rhythm: Normal rate and regular rhythm.  Pulmonary:     Effort: Pulmonary effort is normal. No respiratory distress.     Breath sounds: Normal breath sounds.  Abdominal:     General: Bowel sounds are normal.     Palpations: Abdomen is soft.     Tenderness: There is no abdominal tenderness.  Musculoskeletal:        General: Normal range of motion.     Cervical back: Normal range of motion and neck supple.     Comments: Walker with walker- gait slow and unsteady  Lymphadenopathy:     Cervical: No cervical adenopathy.  Skin:    General: Skin is warm and dry.     Comments: Scalp wound edges well approximated  Neurological:     Mental Status: He is alert and oriented to person, place, and time.     Comments: Constant jerky movements of all limbs  Psychiatric:        Behavior: Behavior normal.        Thought Content: Thought content normal.        Judgment: Judgment normal.    BP 114/67   Pulse (!) 55   Temp (!) 97.4 F (36.3 C) (Temporal)   Ht 5' 6 (1.676 m)   Wt 116 lb (52.6 kg)   BMI 18.72 kg/m   Staple removal X4 scalp       Assessment & Plan:   KEIR VIERNES comes in today with chief complaint of  medical management of chronic issues    Diagnosis and orders addressed:  1. Essential hypertension Low sodium diet - lisinopril -hydrochlorothiazide  (ZESTORETIC ) 20-12.5 MG tablet; Take 1 tablet by mouth daily.  Dispense: 90 tablet; Refill: 1  2. Permanent atrial fibrillation (HCC) Avoid caffeine - apixaban  (ELIQUIS ) 5 MG TABS tablet; TAKE ONE (1) TABLET BY MOUTH TWO (2) TIMES DAILY  Dispense: 60 tablet; Refill: 5 - metoprolol  succinate (TOPROL -XL) 25 MG 24 hr tablet; Take 1 tablet (25 mg total) by mouth daily.  Dispense: 90 tablet; Refill: 1  3. Gastroesophageal reflux disease, unspecified whether esophagitis present Avoid spicy foods Do not eat 2  hours prior to bedtime - omeprazole  (PRILOSEC) 20 MG capsule; Take 1 capsule (20 mg total) by mouth daily.  Dispense: 90 capsule; Refill: 1  4. Hypothyroidism due to acquired atrophy of thyroid  Labs pending - levothyroxine  (SYNTHROID ) 50 MCG tablet; Take 1 tablet (50 mcg total) by mouth daily before breakfast.  Dispense: 90 tablet; Refill: 1  5. Hyperlipidemia with target LDL less than 100 Low fat diet - atorvastatin  (LIPITOR) 80 MG tablet; Take 1 tablet (80 mg total) by mouth at bedtime.  Dispense: 90 tablet; Refill: 1  6. History of cerebrovascular accident (CVA) Fall precautions  7. Recurrent major depressive disorder, in full remission (HCC) Stress management - citalopram  (CELEXA ) 20 MG tablet; Take 1 tablet (20 mg total) by mouth daily.  Dispense: 90 tablet; Refill: 1  8. Diverticulosis of large intestine without hemorrhage Watch diet to prevent flare up  9. Benign prostatic hyperplasia without lower urinary tract symptoms Report any voiding issues  10. Huntington's chorea (HCC) Fall prevention - haloperidol  (HALDOL ) 1 MG tablet; Take 1.5 tablets (1.5 mg total) by mouth 2 (two) times daily.  Dispense: 270 tablet; Refill: 1  11. Memory deficits [place] daily  12. BMI 24.0-24.9, adult Discussed diet and exercise for person with BMI >25 Will recheck weight in 3-6 months  13. Staple removal Keep clean and dry  Follow up plan: 6 months   Mary-Margaret Gladis, FNP

## 2024-06-15 NOTE — Addendum Note (Signed)
 Addended by: GLADIS MUSTARD on: 06/15/2024 02:37 PM   Modules accepted: Level of Service

## 2024-06-15 NOTE — Patient Instructions (Signed)
 Fall Prevention in the Home, Adult Falls can cause injuries and can happen to people of all ages. There are many things you can do to make your home safer and to help prevent falls. What actions can I take to prevent falls? General information Use good lighting in all rooms. Make sure to: Replace any light bulbs that burn out. Turn on the lights in dark areas and use night-lights. Keep items that you use often in easy-to-reach places. Lower the shelves around your home if needed. Move furniture so that there are clear paths around it. Do not use throw rugs or other things on the floor that can make you trip. If any of your floors are uneven, fix them. Add color or contrast paint or tape to clearly mark and help you see: Grab bars or handrails. First and last steps of staircases. Where the edge of each step is. If you use a ladder or stepladder: Make sure that it is fully opened. Do not climb a closed ladder. Make sure the sides of the ladder are locked in place. Have someone hold the ladder while you use it. Know where your pets are as you move through your home. What can I do in the bathroom?     Keep the floor dry. Clean up any water on the floor right away. Remove soap buildup in the bathtub or shower. Buildup makes bathtubs and showers slippery. Use non-skid mats or decals on the floor of the bathtub or shower. Attach bath mats securely with double-sided, non-slip rug tape. If you need to sit down in the shower, use a non-slip stool. Install grab bars by the toilet and in the bathtub and shower. Do not use towel bars as grab bars. What can I do in the bedroom? Make sure that you have a light by your bed that is easy to reach. Do not use any sheets or blankets on your bed that hang to the floor. Have a firm chair or bench with side arms that you can use for support when you get dressed. What can I do in the kitchen? Clean up any spills right away. If you need to reach something  above you, use a step stool with a grab bar. Keep electrical cords out of the way. Do not use floor polish or wax that makes floors slippery. What can I do with my stairs? Do not leave anything on the stairs. Make sure that you have a light switch at the top and the bottom of the stairs. Make sure that there are handrails on both sides of the stairs. Fix handrails that are broken or loose. Install non-slip stair treads on all your stairs if they do not have carpet. Avoid having throw rugs at the top or bottom of the stairs. Choose a carpet that does not hide the edge of the steps on the stairs. Make sure that the carpet is firmly attached to the stairs. Fix carpet that is loose or worn. What can I do on the outside of my home? Use bright outdoor lighting. Fix the edges of walkways and driveways and fix any cracks. Clear paths of anything that can make you trip, such as tools or rocks. Add color or contrast paint or tape to clearly mark and help you see anything that might make you trip as you walk through a door, such as a raised step or threshold. Trim any bushes or trees on paths to your home. Check to see if handrails are loose  or broken and that both sides of all steps have handrails. Install guardrails along the edges of any raised decks and porches. Have leaves, snow, or ice cleared regularly. Use sand, salt, or ice melter on paths if you live where there is ice and snow during the winter. Clean up any spills in your garage right away. This includes grease or oil spills. What other actions can I take? Review your medicines with your doctor. Some medicines can cause dizziness or changes in blood pressure, which increase your risk of falling. Wear shoes that: Have a low heel. Do not wear high heels. Have rubber bottoms and are closed at the toe. Feel good on your feet and fit well. Use tools that help you move around if needed. These include: Canes. Walkers. Scooters. Crutches. Ask  your doctor what else you can do to help prevent falls. This may include seeing a physical therapist to learn to do exercises to move better and get stronger. Where to find more information Centers for Disease Control and Prevention, STEADI: TonerPromos.no [employer] on Aging: BaseRingTones.pl National Institute on Aging: BaseRingTones.pl Contact a doctor if: You are afraid of falling at home. You feel weak, drowsy, or dizzy at home. You fall at home. Get help right away if you: Lose consciousness or have trouble moving after a fall. Have a fall that causes a head injury. These symptoms may be an emergency. Get help right away. Call 911. Do not wait to see if the symptoms will go away. Do not drive yourself to the hospital. This information is not intended to replace advice given to you by your health care provider. Make sure you discuss any questions you have with your health care provider. Document Revised: 04/06/2022 Document Reviewed: 04/06/2022 Elsevier Patient Education  2024 [employer].

## 2024-06-16 ENCOUNTER — Ambulatory Visit: Payer: Self-pay | Admitting: Nurse Practitioner

## 2024-06-16 LAB — CBC WITH DIFFERENTIAL/PLATELET
Basophils Absolute: 0 x10E3/uL (ref 0.0–0.2)
Basos: 0 %
EOS (ABSOLUTE): 0.1 x10E3/uL (ref 0.0–0.4)
Eos: 1 %
Hematocrit: 38.7 % (ref 37.5–51.0)
Hemoglobin: 12.7 g/dL — ABNORMAL LOW (ref 13.0–17.7)
Immature Grans (Abs): 0 x10E3/uL (ref 0.0–0.1)
Immature Granulocytes: 0 %
Lymphocytes Absolute: 2.4 x10E3/uL (ref 0.7–3.1)
Lymphs: 25 %
MCH: 33.4 pg — ABNORMAL HIGH (ref 26.6–33.0)
MCHC: 32.8 g/dL (ref 31.5–35.7)
MCV: 102 fL — ABNORMAL HIGH (ref 79–97)
Monocytes Absolute: 1.2 x10E3/uL — ABNORMAL HIGH (ref 0.1–0.9)
Monocytes: 12 %
Neutrophils Absolute: 6 x10E3/uL (ref 1.4–7.0)
Neutrophils: 62 %
Platelets: 261 x10E3/uL (ref 150–450)
RBC: 3.8 x10E6/uL — ABNORMAL LOW (ref 4.14–5.80)
RDW: 11.9 % (ref 11.6–15.4)
WBC: 9.8 x10E3/uL (ref 3.4–10.8)

## 2024-06-16 LAB — CMP14+EGFR
ALT: 19 IU/L (ref 0–44)
AST: 28 IU/L (ref 0–40)
Albumin: 3.8 g/dL (ref 3.7–4.7)
Alkaline Phosphatase: 82 IU/L (ref 48–129)
BUN/Creatinine Ratio: 15 (ref 10–24)
BUN: 16 mg/dL (ref 8–27)
Bilirubin Total: 0.7 mg/dL (ref 0.0–1.2)
CO2: 25 mmol/L (ref 20–29)
Calcium: 6.9 mg/dL — CL (ref 8.6–10.2)
Chloride: 100 mmol/L (ref 96–106)
Creatinine, Ser: 1.1 mg/dL (ref 0.76–1.27)
Globulin, Total: 2.9 g/dL (ref 1.5–4.5)
Glucose: 86 mg/dL (ref 70–99)
Potassium: 3.4 mmol/L — ABNORMAL LOW (ref 3.5–5.2)
Sodium: 143 mmol/L (ref 134–144)
Total Protein: 6.7 g/dL (ref 6.0–8.5)
eGFR: 65 mL/min/1.73 (ref >59)

## 2024-06-16 LAB — LIPID PANEL
Chol/HDL Ratio: 2.7 ratio (ref 0.0–5.0)
Cholesterol, Total: 114 mg/dL (ref 100–199)
HDL: 42 mg/dL (ref >39)
LDL Chol Calc (NIH): 56 mg/dL (ref 0–99)
Triglycerides: 76 mg/dL (ref 0–149)
VLDL Cholesterol Cal: 16 mg/dL (ref 5–40)

## 2024-06-16 LAB — THYROID PANEL WITH TSH
Free Thyroxine Index: 2 (ref 1.2–4.9)
T3 Uptake Ratio: 34 % (ref 24–39)
T4, Total: 6 ug/dL (ref 4.5–12.0)
TSH: 8.25 u[IU]/mL — ABNORMAL HIGH (ref 0.450–4.500)

## 2024-06-16 MED ORDER — LEVOTHYROXINE SODIUM 75 MCG PO TABS: 75.0000 ug | ORAL_TABLET | Freq: Every day | ORAL | 1 refills | Status: AC

## 2024-06-19 ENCOUNTER — Other Ambulatory Visit: Payer: Self-pay | Admitting: Nurse Practitioner

## 2024-06-19 MED ORDER — POTASSIUM CHLORIDE CRYS ER 20 MEQ PO TBCR: 30.0000 meq | EXTENDED_RELEASE_TABLET | Freq: Every day | ORAL | 5 refills | Status: AC

## 2024-06-19 NOTE — Progress Notes (Signed)
 Meds ordered this encounter  Medications   potassium chloride  SA (KLOR-CON  M) 20 MEQ tablet    Sig: Take 1.5 tablets (30 mEq total) by mouth daily.    Dispense:  60 tablet    Refill:  5    Supervising Provider:   MARYANNE CHEW A [[id number]]   Mary-Margaret Gladis, FNP

## 2024-07-10 ENCOUNTER — Ambulatory Visit: Payer: Self-pay

## 2024-07-10 NOTE — Telephone Encounter (Signed)
 Pt wife calling stating that she needs to speak to PCP or PCP clinic staff. Per caller Beryl is pooping himself and screaming things. Caller states that pt is more confused than normal. RN advised wife to take pt to the ED, caller refused, stating what are they going to do for him. Wife states at this time that he is acting normal, but she needs help at home and wants a call back from PCP. FYI Only or Action Required?: FYI only for provider: ED advised.  Patient was last seen in primary care on 06/15/2024 by Gladis Mustard, FNP.  Called Nurse Triage reporting pt wife would like PCP to call her. .  Symptoms began today.  Interventions attempted: Nothing.  Triage Disposition: Go to ED Now (or PCP Triage), Go to ED Now  Patient/caregiver understands and will follow disposition?: No, wishes to speak with PCP   Copied from CRM #[id number]. Topic: Clinical - Red Word Triage >> Jul 10, 2024  3:56 PM Myrick T wrote: Red Word that prompted transfer to Nurse Triage: wife refused to give agent reason for emergency. Reason for Disposition  Patient sounds very sick or weak to the triager  Answer Assessment - Initial Assessment Questions 1. LEVEL OF CONSCIOUSNESS: How are they (the patient) acting right now? (e.g., alert-oriented, confused, lethargic, stuporous, comatose)     Per wife, pt is screaming things 2. ONSET: When did the confusion start?  (e.g., minutes, hours, days)     Wife states this is new for him 7. OTHER SYMPTOMS: Are there any other symptoms? (e.g., difficulty breathing, fever, headache, weakness)     Incontinent bowels.  Protocols used: Confusion - Delirium-A-AH

## 2024-08-29 ENCOUNTER — Telehealth: Payer: Self-pay

## 2024-08-29 NOTE — Telephone Encounter (Signed)
Called and left detailed message per DPR.

## 2024-08-29 NOTE — Telephone Encounter (Signed)
 05/23/12- dx of huntington's chorea

## 2024-08-29 NOTE — Telephone Encounter (Signed)
 Copied from CRM [id number]. Topic: General - Other >> Aug 29, 2024  1:27 PM Sophia H wrote: Reason for CRM: Patients wife is calling on behalf of the patient, wanting to know date patient was diagnosed with Huntington Chorea Disease. Please reach out # [phone]

## 2024-09-04 ENCOUNTER — Telehealth: Payer: Self-pay | Admitting: Neurology

## 2024-09-04 NOTE — Telephone Encounter (Signed)
 Facility called to Cancel appt due to Pt doesn't need appt   Appt Canceled

## 2024-11-16 ENCOUNTER — Institutional Professional Consult (permissible substitution): Admitting: Neurology

## 2024-12-12 ENCOUNTER — Ambulatory Visit: Admitting: Nurse Practitioner

## 2025-06-05 ENCOUNTER — Ambulatory Visit
